# Patient Record
Sex: Male | Born: 1940
Health system: Southern US, Community
[De-identification: ages and names within clinical notes are randomized; demographics above are authoritative.]

## PROBLEM LIST (undated history)

## (undated) DIAGNOSIS — N529 Male erectile dysfunction, unspecified: Secondary | ICD-10-CM

## (undated) DIAGNOSIS — R4701 Aphasia: Secondary | ICD-10-CM

## (undated) DIAGNOSIS — H9190 Unspecified hearing loss, unspecified ear: Secondary | ICD-10-CM

## (undated) DIAGNOSIS — I4891 Unspecified atrial fibrillation: Secondary | ICD-10-CM

## (undated) DIAGNOSIS — R001 Bradycardia, unspecified: Secondary | ICD-10-CM

## (undated) DIAGNOSIS — I9789 Other postprocedural complications and disorders of the circulatory system, not elsewhere classified: Secondary | ICD-10-CM

## (undated) DIAGNOSIS — M722 Plantar fascial fibromatosis: Secondary | ICD-10-CM

## (undated) DIAGNOSIS — Z973 Presence of spectacles and contact lenses: Secondary | ICD-10-CM

## (undated) DIAGNOSIS — C801 Malignant (primary) neoplasm, unspecified: Secondary | ICD-10-CM

## (undated) DIAGNOSIS — I4589 Other specified conduction disorders: Secondary | ICD-10-CM

## (undated) DIAGNOSIS — I2510 Atherosclerotic heart disease of native coronary artery without angina pectoris: Secondary | ICD-10-CM

## (undated) DIAGNOSIS — I350 Nonrheumatic aortic (valve) stenosis: Secondary | ICD-10-CM

## (undated) DIAGNOSIS — R972 Elevated prostate specific antigen [PSA]: Secondary | ICD-10-CM

## (undated) DIAGNOSIS — I6529 Occlusion and stenosis of unspecified carotid artery: Secondary | ICD-10-CM

## (undated) DIAGNOSIS — N400 Enlarged prostate without lower urinary tract symptoms: Secondary | ICD-10-CM

## (undated) HISTORY — DX: Occlusion and stenosis of unspecified carotid artery: I65.29

## (undated) HISTORY — DX: Malignant (primary) neoplasm, unspecified: C80.1

## (undated) HISTORY — DX: Plantar fascial fibromatosis: M72.2

## (undated) HISTORY — DX: Unspecified atrial fibrillation: I97.89

## (undated) HISTORY — PX: HERNIA REPAIR: SHX51

## (undated) HISTORY — DX: Male erectile dysfunction, unspecified: N52.9

## (undated) HISTORY — PX: EXTERNAL EAR SURGERY: SHX627

## (undated) HISTORY — DX: Aphasia: R47.01

## (undated) HISTORY — DX: Atherosclerotic heart disease of native coronary artery without angina pectoris: I25.10

## (undated) HISTORY — DX: Elevated prostate specific antigen (PSA): R97.20

## (undated) HISTORY — DX: Bradycardia, unspecified: R00.1

## (undated) HISTORY — PX: TRANSCATHETER AORTIC VALVE REPLACEMENT, TRANSFEMORAL: SHX6400

## (undated) HISTORY — DX: Unspecified atrial fibrillation: I48.91

---

## 2012-03-02 DIAGNOSIS — L570 Actinic keratosis: Secondary | ICD-10-CM | POA: Diagnosis not present

## 2012-03-02 DIAGNOSIS — Z125 Encounter for screening for malignant neoplasm of prostate: Secondary | ICD-10-CM | POA: Diagnosis not present

## 2012-03-02 DIAGNOSIS — E781 Pure hyperglyceridemia: Secondary | ICD-10-CM | POA: Diagnosis not present

## 2012-03-02 DIAGNOSIS — E291 Testicular hypofunction: Secondary | ICD-10-CM | POA: Diagnosis not present

## 2012-03-02 DIAGNOSIS — R5381 Other malaise: Secondary | ICD-10-CM | POA: Diagnosis not present

## 2012-04-19 DIAGNOSIS — Z411 Encounter for cosmetic surgery: Secondary | ICD-10-CM | POA: Diagnosis not present

## 2012-04-19 DIAGNOSIS — L570 Actinic keratosis: Secondary | ICD-10-CM | POA: Diagnosis not present

## 2012-04-19 DIAGNOSIS — B078 Other viral warts: Secondary | ICD-10-CM | POA: Diagnosis not present

## 2012-04-19 DIAGNOSIS — L821 Other seborrheic keratosis: Secondary | ICD-10-CM | POA: Diagnosis not present

## 2012-06-01 DIAGNOSIS — M19049 Primary osteoarthritis, unspecified hand: Secondary | ICD-10-CM | POA: Diagnosis not present

## 2012-11-01 ENCOUNTER — Ambulatory Visit (INDEPENDENT_AMBULATORY_CARE_PROVIDER_SITE_OTHER): Payer: Medicare Other | Admitting: Sports Medicine

## 2012-11-01 VITALS — BP 142/74 | Ht 70.0 in | Wt 170.0 lb

## 2012-11-01 DIAGNOSIS — R351 Nocturia: Secondary | ICD-10-CM | POA: Diagnosis not present

## 2012-11-01 DIAGNOSIS — N138 Other obstructive and reflux uropathy: Secondary | ICD-10-CM | POA: Diagnosis not present

## 2012-11-01 DIAGNOSIS — N401 Enlarged prostate with lower urinary tract symptoms: Secondary | ICD-10-CM

## 2012-11-01 DIAGNOSIS — R350 Frequency of micturition: Secondary | ICD-10-CM | POA: Diagnosis not present

## 2012-11-01 DIAGNOSIS — M6289 Other specified disorders of muscle: Secondary | ICD-10-CM

## 2012-11-01 DIAGNOSIS — IMO0001 Reserved for inherently not codable concepts without codable children: Secondary | ICD-10-CM

## 2012-11-01 DIAGNOSIS — R29898 Other symptoms and signs involving the musculoskeletal system: Secondary | ICD-10-CM

## 2012-11-01 DIAGNOSIS — M2550 Pain in unspecified joint: Secondary | ICD-10-CM

## 2012-11-01 DIAGNOSIS — M791 Myalgia, unspecified site: Secondary | ICD-10-CM

## 2012-11-01 DIAGNOSIS — R5381 Other malaise: Secondary | ICD-10-CM | POA: Diagnosis not present

## 2012-11-01 DIAGNOSIS — R5383 Other fatigue: Secondary | ICD-10-CM

## 2012-11-01 LAB — COMPREHENSIVE METABOLIC PANEL
ALT: 18 U/L (ref 0–53)
AST: 22 U/L (ref 0–37)
Albumin: 4.1 g/dL (ref 3.5–5.2)
Alkaline Phosphatase: 51 U/L (ref 39–117)
BUN: 21 mg/dL (ref 6–23)
CO2: 29 mEq/L (ref 19–32)
Calcium: 9.2 mg/dL (ref 8.4–10.5)
Chloride: 104 mEq/L (ref 96–112)
Creat: 0.91 mg/dL (ref 0.50–1.35)
Glucose, Bld: 64 mg/dL — ABNORMAL LOW (ref 70–99)
Potassium: 4.5 mEq/L (ref 3.5–5.3)
Sodium: 140 mEq/L (ref 135–145)
Total Bilirubin: 0.8 mg/dL (ref 0.3–1.2)
Total Protein: 6.9 g/dL (ref 6.0–8.3)

## 2012-11-01 LAB — CBC
HCT: 45.1 % (ref 39.0–52.0)
Hemoglobin: 15.3 g/dL (ref 13.0–17.0)
MCH: 32.4 pg (ref 26.0–34.0)
MCHC: 33.9 g/dL (ref 30.0–36.0)
MCV: 95.6 fL (ref 78.0–100.0)
Platelets: 193 10*3/uL (ref 150–400)
RBC: 4.72 MIL/uL (ref 4.22–5.81)
RDW: 13 % (ref 11.5–15.5)
WBC: 5.2 10*3/uL (ref 4.0–10.5)

## 2012-11-01 LAB — CK: Total CK: 122 U/L (ref 7–232)

## 2012-11-01 LAB — FERRITIN: Ferritin: 85 ng/mL (ref 22–322)

## 2012-11-01 NOTE — Patient Instructions (Addendum)
You have been scheduled for an appointment with Dr. Laverle Patter at [facility] urology on 12/07/12 at 11:15 am Their phone number is [phone]

## 2012-11-01 NOTE — Assessment & Plan Note (Signed)
This needs evaluation by urology. We arranged a referral to Dr. Laverle Patter.  His urological symptoms severely interfere with his quality of life.

## 2012-11-01 NOTE — Progress Notes (Signed)
Patient ID: Joshua Hess, male   DOB: 1941/06/09, 72 y.o.   MRN: [id number]  Patient is a runner for over 40 years. He has run over 100 marathon. For the last 18 months he has found himself unable to do what he is in the past. Now he hasdeveloped bilateral quadriceps soreness that will stick with it for a day or 2 after a long run. This has been progressively worse over last 6 months. He continues to do some long runs in the range of 20 miles but starting at miles 12 to 14 he has to walk/ run just  To finish.  Runs 40 miles per week when he is training okay Still goes to the gym 3 days a week and does strength work  Denies any specific injury Diet overall is pretty good but he is a vegetarian. He thinks he may sometimes like enough protein or minerals in his diet. He has not taken specific supplements.  Medical history is very unremarkable with no surgeries. He is taking no medications. No history of hospitalizations.  Review of systems is quite remarkable in that he gets up 6-8 times a night with frequent urination. During runs he will lose 5-6 pounds of sweating. If he drinks enough to replace the fluid he will stay up  all night because of frequency. PSA  about 18 months ago was normal. Annual physical exams have been normal  Physical examination Athletic-appearing older male with excellent strength No acute distress Static strength testing of hip flexion and abduction is excellent Quadriceps strength is excellent Hamstring strength excellent Calf strength excellent  Straight leg raise is normal Neurological evaluation normal SI joint reveals normal motion  Feet reveals some breakdown of the transverse arch on the left with splayed toes and bunionette  Prostate feels to be somewhat boggy and about 3+ Stool blood was negative

## 2012-11-01 NOTE — Assessment & Plan Note (Signed)
I will do a complete metabolic workup of labs  This gentleman seems very healthy but he has chronic muscle aching and inability to exercise like he's been doing for years is troublesome  He may be getting incomplete muscle recovery that is related to his insomnia and frequency with nocturia

## 2012-11-02 LAB — PSA: PSA: 2.79 ng/mL (ref <=4.00)

## 2012-11-02 LAB — SEDIMENTATION RATE: Sed Rate: 1 mm/hr (ref 0–16)

## 2012-12-07 DIAGNOSIS — N529 Male erectile dysfunction, unspecified: Secondary | ICD-10-CM | POA: Diagnosis not present

## 2012-12-07 DIAGNOSIS — R351 Nocturia: Secondary | ICD-10-CM | POA: Diagnosis not present

## 2013-08-10 DIAGNOSIS — L578 Other skin changes due to chronic exposure to nonionizing radiation: Secondary | ICD-10-CM | POA: Diagnosis not present

## 2013-08-10 DIAGNOSIS — Z85828 Personal history of other malignant neoplasm of skin: Secondary | ICD-10-CM | POA: Diagnosis not present

## 2013-08-10 DIAGNOSIS — Z0000 Encounter for general adult medical examination without abnormal findings: Secondary | ICD-10-CM | POA: Diagnosis not present

## 2013-08-10 DIAGNOSIS — E663 Overweight: Secondary | ICD-10-CM | POA: Diagnosis not present

## 2013-08-10 DIAGNOSIS — Z125 Encounter for screening for malignant neoplasm of prostate: Secondary | ICD-10-CM | POA: Diagnosis not present

## 2013-08-10 DIAGNOSIS — M25519 Pain in unspecified shoulder: Secondary | ICD-10-CM | POA: Diagnosis not present

## 2013-09-01 DIAGNOSIS — Z125 Encounter for screening for malignant neoplasm of prostate: Secondary | ICD-10-CM | POA: Diagnosis not present

## 2013-09-15 DIAGNOSIS — N401 Enlarged prostate with lower urinary tract symptoms: Secondary | ICD-10-CM | POA: Diagnosis not present

## 2013-09-15 DIAGNOSIS — N139 Obstructive and reflux uropathy, unspecified: Secondary | ICD-10-CM | POA: Diagnosis not present

## 2013-09-15 DIAGNOSIS — R972 Elevated prostate specific antigen [PSA]: Secondary | ICD-10-CM | POA: Diagnosis not present

## 2013-09-15 DIAGNOSIS — R351 Nocturia: Secondary | ICD-10-CM | POA: Diagnosis not present

## 2013-10-06 DIAGNOSIS — Z1211 Encounter for screening for malignant neoplasm of colon: Secondary | ICD-10-CM | POA: Diagnosis not present

## 2014-03-16 DIAGNOSIS — R972 Elevated prostate specific antigen [PSA]: Secondary | ICD-10-CM | POA: Diagnosis not present

## 2014-03-21 ENCOUNTER — Encounter: Payer: Self-pay | Admitting: Podiatry

## 2014-03-21 ENCOUNTER — Ambulatory Visit (INDEPENDENT_AMBULATORY_CARE_PROVIDER_SITE_OTHER): Payer: Medicare Other

## 2014-03-21 ENCOUNTER — Ambulatory Visit (INDEPENDENT_AMBULATORY_CARE_PROVIDER_SITE_OTHER): Payer: Medicare Other | Admitting: Podiatry

## 2014-03-21 VITALS — BP 138/76 | HR 53 | Resp 18

## 2014-03-21 DIAGNOSIS — R52 Pain, unspecified: Secondary | ICD-10-CM | POA: Diagnosis not present

## 2014-03-21 DIAGNOSIS — M2040 Other hammer toe(s) (acquired), unspecified foot: Secondary | ICD-10-CM

## 2014-03-21 NOTE — Progress Notes (Signed)
   Subjective:    Patient ID: Joshua Hess, male    DOB: [birth year], 73 y.o.   MRN: [id number]  HPI i have three toes on the left foot, 2nd and 3rd and 4th and they are at an angle and they ache and hurts with shoes and no swelling or burning or throbbing and no numbness or tingling   73 year old male presents the office today with complaints of left second and third toe changes. States that he is an avid runner and has noticed some discomfort around the toes after long runs. He is noted the second and third toes start to drift medially and he tapes the toes together while he runs to help take the pressure off. Denies any trauma to the area. Only has pain while running long distances. No other complaints at this time.     Review of Systems  HENT: Positive for hearing loss.   All other systems reviewed and are negative.      Objective:   Physical Exam AAO x3, NAD DP/PT pulses palpable 2/4 b/l. CRT < 3 sec Left foot second digit slight semirigid hammertoe contracture with medial deviation of the digit. Medial deviation of the third digit. No tenderness to palpation around the area. While running has pain along the dorsal aspect of the digit as well as plantar pain just distal to the metatarsal heads. No specific tenderness in the area at this time. No edema. No pain with vibratory sensation. Right second digit with slight flexible hammertoe contracture. Mild equinus bilaterally.  MMT 5/5, ROM WNL No leg pain/edema/warmth.       Assessment & Plan:  73 year old male left foot hammertoe contractures possible chronic plantar plate injury. -Various treatment options both conservative and surgical were discussed the patient including alternatives, risks, complications. -X-rays were obtained and reviewed with the patient. -Patient brought and multiple pairs running shoes which reviewed. Due to the hammering of the digits recommended a higher toe box to accommodate the toes. -Dispensed metatarsal  pads to help elevate the metatarsal heads to hopefully help take the pressure off of the plantar foot and to take pressure off of the toes. -Discussed custom versus over-the-counter orthotics to help support his foot type -Followup as needed. Call with any questions or concerns or any change in symptoms.

## 2014-03-22 ENCOUNTER — Encounter: Payer: Self-pay | Admitting: Podiatry

## 2014-03-30 DIAGNOSIS — N401 Enlarged prostate with lower urinary tract symptoms: Secondary | ICD-10-CM | POA: Diagnosis not present

## 2014-03-30 DIAGNOSIS — R351 Nocturia: Secondary | ICD-10-CM | POA: Diagnosis not present

## 2014-03-30 DIAGNOSIS — R972 Elevated prostate specific antigen [PSA]: Secondary | ICD-10-CM | POA: Diagnosis not present

## 2014-03-30 DIAGNOSIS — N529 Male erectile dysfunction, unspecified: Secondary | ICD-10-CM | POA: Diagnosis not present

## 2014-04-16 DIAGNOSIS — Z23 Encounter for immunization: Secondary | ICD-10-CM | POA: Diagnosis not present

## 2014-05-23 DIAGNOSIS — D485 Neoplasm of uncertain behavior of skin: Secondary | ICD-10-CM | POA: Diagnosis not present

## 2014-05-23 DIAGNOSIS — L570 Actinic keratosis: Secondary | ICD-10-CM | POA: Diagnosis not present

## 2014-05-23 DIAGNOSIS — D692 Other nonthrombocytopenic purpura: Secondary | ICD-10-CM | POA: Diagnosis not present

## 2014-05-23 DIAGNOSIS — B079 Viral wart, unspecified: Secondary | ICD-10-CM | POA: Diagnosis not present

## 2014-05-23 DIAGNOSIS — L814 Other melanin hyperpigmentation: Secondary | ICD-10-CM | POA: Diagnosis not present

## 2014-05-24 DIAGNOSIS — C44622 Squamous cell carcinoma of skin of right upper limb, including shoulder: Secondary | ICD-10-CM | POA: Diagnosis not present

## 2014-06-13 DIAGNOSIS — M1711 Unilateral primary osteoarthritis, right knee: Secondary | ICD-10-CM | POA: Diagnosis not present

## 2014-06-13 DIAGNOSIS — M25561 Pain in right knee: Secondary | ICD-10-CM | POA: Diagnosis not present

## 2014-06-25 DIAGNOSIS — L570 Actinic keratosis: Secondary | ICD-10-CM | POA: Diagnosis not present

## 2014-06-28 DIAGNOSIS — C44622 Squamous cell carcinoma of skin of right upper limb, including shoulder: Secondary | ICD-10-CM | POA: Diagnosis not present

## 2014-06-29 DIAGNOSIS — C44622 Squamous cell carcinoma of skin of right upper limb, including shoulder: Secondary | ICD-10-CM | POA: Diagnosis not present

## 2014-07-09 DIAGNOSIS — L089 Local infection of the skin and subcutaneous tissue, unspecified: Secondary | ICD-10-CM | POA: Diagnosis not present

## 2014-07-09 DIAGNOSIS — L570 Actinic keratosis: Secondary | ICD-10-CM | POA: Diagnosis not present

## 2014-08-10 DIAGNOSIS — Z0000 Encounter for general adult medical examination without abnormal findings: Secondary | ICD-10-CM | POA: Diagnosis not present

## 2014-08-10 DIAGNOSIS — E663 Overweight: Secondary | ICD-10-CM | POA: Diagnosis not present

## 2014-09-06 DIAGNOSIS — D1801 Hemangioma of skin and subcutaneous tissue: Secondary | ICD-10-CM | POA: Diagnosis not present

## 2014-09-06 DIAGNOSIS — Z85828 Personal history of other malignant neoplasm of skin: Secondary | ICD-10-CM | POA: Diagnosis not present

## 2014-09-06 DIAGNOSIS — L814 Other melanin hyperpigmentation: Secondary | ICD-10-CM | POA: Diagnosis not present

## 2014-09-06 DIAGNOSIS — L821 Other seborrheic keratosis: Secondary | ICD-10-CM | POA: Diagnosis not present

## 2014-09-06 DIAGNOSIS — T8189XA Other complications of procedures, not elsewhere classified, initial encounter: Secondary | ICD-10-CM | POA: Diagnosis not present

## 2014-09-06 DIAGNOSIS — L570 Actinic keratosis: Secondary | ICD-10-CM | POA: Diagnosis not present

## 2014-12-25 DIAGNOSIS — D179 Benign lipomatous neoplasm, unspecified: Secondary | ICD-10-CM | POA: Diagnosis not present

## 2014-12-25 DIAGNOSIS — R972 Elevated prostate specific antigen [PSA]: Secondary | ICD-10-CM | POA: Diagnosis not present

## 2014-12-25 DIAGNOSIS — Z1212 Encounter for screening for malignant neoplasm of rectum: Secondary | ICD-10-CM | POA: Diagnosis not present

## 2014-12-25 DIAGNOSIS — N401 Enlarged prostate with lower urinary tract symptoms: Secondary | ICD-10-CM | POA: Diagnosis not present

## 2014-12-25 DIAGNOSIS — H538 Other visual disturbances: Secondary | ICD-10-CM | POA: Diagnosis not present

## 2014-12-26 DIAGNOSIS — L905 Scar conditions and fibrosis of skin: Secondary | ICD-10-CM | POA: Diagnosis not present

## 2014-12-26 DIAGNOSIS — Z85828 Personal history of other malignant neoplasm of skin: Secondary | ICD-10-CM | POA: Diagnosis not present

## 2014-12-26 DIAGNOSIS — L821 Other seborrheic keratosis: Secondary | ICD-10-CM | POA: Diagnosis not present

## 2014-12-26 DIAGNOSIS — L814 Other melanin hyperpigmentation: Secondary | ICD-10-CM | POA: Diagnosis not present

## 2014-12-26 DIAGNOSIS — L570 Actinic keratosis: Secondary | ICD-10-CM | POA: Diagnosis not present

## 2015-05-20 ENCOUNTER — Ambulatory Visit (INDEPENDENT_AMBULATORY_CARE_PROVIDER_SITE_OTHER): Payer: Medicare Other

## 2015-05-20 ENCOUNTER — Ambulatory Visit (INDEPENDENT_AMBULATORY_CARE_PROVIDER_SITE_OTHER): Payer: Medicare Other | Admitting: Podiatry

## 2015-05-20 DIAGNOSIS — M2040 Other hammer toe(s) (acquired), unspecified foot: Secondary | ICD-10-CM

## 2015-05-20 DIAGNOSIS — B351 Tinea unguium: Secondary | ICD-10-CM | POA: Diagnosis not present

## 2015-05-20 DIAGNOSIS — M779 Enthesopathy, unspecified: Secondary | ICD-10-CM | POA: Diagnosis not present

## 2015-05-21 NOTE — Progress Notes (Signed)
Subjective:     Patient ID: Joshua Hess, male   DOB: 1941-02-27, 74 y.o.   MRN: [id number]  HPI patient presents stating he's concerned about the continued movement of his second toe with elevation and that there is some rigid contracture of it and he is concerned it could be a problem   Review of Systems     Objective:   Physical Exam Neurovascular status found to be intact muscle strength adequate with continued slight movement of the second toe left and a dorsal and medial direction but no current pain with mild plantar keratotic lesion formation    Assessment:     Probable flexor plate stretch causing disruption of the joint and movement of the second toe    Plan:     Explain that it's impossible to give him complete idea of what this toe will do in the future but I do not recommend surgery if it's not painful. I discussed shoe gear modifications and considerations for orthotics to try to reduce plantar pressure on the feet

## 2015-05-23 ENCOUNTER — Encounter: Payer: Self-pay | Admitting: Sports Medicine

## 2015-05-23 ENCOUNTER — Ambulatory Visit (INDEPENDENT_AMBULATORY_CARE_PROVIDER_SITE_OTHER): Payer: Medicare Other | Admitting: Sports Medicine

## 2015-05-23 VITALS — BP 147/83 | Ht 70.0 in | Wt 166.0 lb

## 2015-05-23 DIAGNOSIS — M791 Myalgia, unspecified site: Secondary | ICD-10-CM

## 2015-05-23 DIAGNOSIS — M25561 Pain in right knee: Secondary | ICD-10-CM

## 2015-05-23 DIAGNOSIS — IMO0001 Reserved for inherently not codable concepts without codable children: Secondary | ICD-10-CM

## 2015-05-23 DIAGNOSIS — M609 Myositis, unspecified: Secondary | ICD-10-CM | POA: Diagnosis not present

## 2015-05-23 DIAGNOSIS — M25562 Pain in left knee: Secondary | ICD-10-CM | POA: Diagnosis not present

## 2015-05-23 DIAGNOSIS — M25569 Pain in unspecified knee: Secondary | ICD-10-CM | POA: Diagnosis not present

## 2015-05-23 DIAGNOSIS — R5383 Other fatigue: Secondary | ICD-10-CM

## 2015-05-23 DIAGNOSIS — R279 Unspecified lack of coordination: Secondary | ICD-10-CM

## 2015-05-23 LAB — CBC
HCT: 44.7 % (ref 39.0–52.0)
Hemoglobin: 15.2 g/dL (ref 13.0–17.0)
MCH: 32.3 pg (ref 26.0–34.0)
MCHC: 34 g/dL (ref 30.0–36.0)
MCV: 95.1 fL (ref 78.0–100.0)
MPV: 9.2 fL (ref 8.6–12.4)
Platelets: 196 10*3/uL (ref 150–400)
RBC: 4.7 MIL/uL (ref 4.22–5.81)
RDW: 12.7 % (ref 11.5–15.5)
WBC: 5.6 10*3/uL (ref 4.0–10.5)

## 2015-05-23 LAB — FERRITIN: Ferritin: 103 ng/mL (ref 22–322)

## 2015-05-23 LAB — VITAMIN B12: Vitamin B-12: 731 pg/mL (ref 211–911)

## 2015-05-23 MED ORDER — GABAPENTIN 300 MG PO CAPS: 300.0000 mg | ORAL_CAPSULE | Freq: Every day | ORAL | Status: DC

## 2015-05-23 NOTE — Assessment & Plan Note (Signed)
Check CBC, Ferritin, B12 for underlying metabolic issues May be sleep related so will start on neurontin 300 mg QHS for 4-6 weeks Start quad strengthening/abduction strengthening exercises as pt has a shuffle marathon gait with running where his quads appear weak and may be causing him to walk after certain mileage.  F/U in 4-6 weeks to see how doing and consider Low back x-rays for spinal stenosis component at that time

## 2015-05-23 NOTE — Progress Notes (Signed)
  Joshua Hess - 74 y.o. male MRN [id number]  Date of birth: 1941/01/20 Joshua Hess is a 74 y.o. male who presents today for leg fatigue.  Leg fatigue, initial visit - patient presents today for ongoing worsening fatigue as bilateral legs for the past 1.5-2 years. He notices it when he is running and he can only run about a mile before he starts to have fatigue in his quads causing him to eventually walk. He is very active and used to do multiple exercises including lunges and squats. Denies any back pain or paresthesias going down his leg. He does have a history of BPH followed by urology in which this wakes him up for 5 times per night. No concerning factors like fevers chills night sweats or unintentional weight loss. He denies any frank pain and hip either one of his legs but does notice that his running ability has extremely decreased over the past 1.5-2 years. No changes around that time. Denies any pica/pagophagia but is vegetarian but states gets lots of protein in diet.   PMHx - Updated and reviewed.  Contributory factors include: BPH with multiple nighttime awakenings  PSHx - Updated and reviewed.  Contributory factors include:  Denies  FHx - Updated and reviewed.  Contributory factors include:  Denies  Medications - None    ROS Per HPI.  12 point negative other than per HPI.   Exam:  There were no vitals filed for this visit. Gen: NAD Cardiorespiratory - Normal respiratory effort/rate.  RRR Hip: Normal alignment, normal ROM in all planes B/L.  MS 5/5 with slight abduction weakness L>R.  Neurovascularly intact B/L LE and negative SLR B/L  Imaging:  Non obtained today

## 2015-05-24 ENCOUNTER — Telehealth: Payer: Self-pay | Admitting: *Deleted

## 2015-05-24 NOTE — Telephone Encounter (Signed)
Left msg about normal labwork.

## 2015-05-24 NOTE — Telephone Encounter (Signed)
-----   Message from Stefanie Libel, MD sent at 05/24/2015 10:42 AM EST ----- Berton Lan  Please give him a call to let him know that all his lab values are good.  With that the case we will work on the sleep issue with gabapentin as we discussed.  KBF ----- Message -----    From: Lab in [facility] Interface    Sent: 05/23/2015   9:07 PM      To: Stefanie Libel, MD

## 2015-05-29 ENCOUNTER — Telehealth: Payer: Self-pay | Admitting: *Deleted

## 2015-05-29 ENCOUNTER — Other Ambulatory Visit: Payer: Self-pay | Admitting: *Deleted

## 2015-05-29 MED ORDER — GABAPENTIN 100 MG PO CAPS: 100.0000 mg | ORAL_CAPSULE | Freq: Every day | ORAL | Status: DC

## 2015-05-29 NOTE — Telephone Encounter (Signed)
Patient called saying the 300mg  gabapentin was leaving him too drowsy.  We changed the dose to the 100mg  and he can take one at night and increase up to 3 for the intended effect.

## 2015-07-03 ENCOUNTER — Ambulatory Visit: Payer: Medicare Other | Admitting: Family Medicine

## 2015-09-10 DIAGNOSIS — L570 Actinic keratosis: Secondary | ICD-10-CM | POA: Diagnosis not present

## 2015-09-10 DIAGNOSIS — D485 Neoplasm of uncertain behavior of skin: Secondary | ICD-10-CM | POA: Diagnosis not present

## 2015-09-12 DIAGNOSIS — C434 Malignant melanoma of scalp and neck: Secondary | ICD-10-CM | POA: Diagnosis not present

## 2015-09-22 DIAGNOSIS — C434 Malignant melanoma of scalp and neck: Secondary | ICD-10-CM | POA: Insufficient documentation

## 2015-09-23 DIAGNOSIS — C434 Malignant melanoma of scalp and neck: Secondary | ICD-10-CM | POA: Diagnosis not present

## 2015-09-26 DIAGNOSIS — C434 Malignant melanoma of scalp and neck: Secondary | ICD-10-CM | POA: Diagnosis not present

## 2015-09-30 DIAGNOSIS — L570 Actinic keratosis: Secondary | ICD-10-CM | POA: Diagnosis not present

## 2015-09-30 DIAGNOSIS — L814 Other melanin hyperpigmentation: Secondary | ICD-10-CM | POA: Diagnosis not present

## 2015-09-30 DIAGNOSIS — L853 Xerosis cutis: Secondary | ICD-10-CM | POA: Diagnosis not present

## 2015-09-30 DIAGNOSIS — K130 Diseases of lips: Secondary | ICD-10-CM | POA: Diagnosis not present

## 2015-09-30 DIAGNOSIS — C434 Malignant melanoma of scalp and neck: Secondary | ICD-10-CM | POA: Diagnosis not present

## 2015-09-30 DIAGNOSIS — L821 Other seborrheic keratosis: Secondary | ICD-10-CM | POA: Diagnosis not present

## 2015-09-30 DIAGNOSIS — D1801 Hemangioma of skin and subcutaneous tissue: Secondary | ICD-10-CM | POA: Diagnosis not present

## 2015-09-30 DIAGNOSIS — Z85828 Personal history of other malignant neoplasm of skin: Secondary | ICD-10-CM | POA: Diagnosis not present

## 2015-10-07 DIAGNOSIS — C434 Malignant melanoma of scalp and neck: Secondary | ICD-10-CM | POA: Diagnosis not present

## 2015-10-07 DIAGNOSIS — G4700 Insomnia, unspecified: Secondary | ICD-10-CM | POA: Diagnosis not present

## 2015-10-07 DIAGNOSIS — N401 Enlarged prostate with lower urinary tract symptoms: Secondary | ICD-10-CM | POA: Diagnosis not present

## 2015-10-08 DIAGNOSIS — G4700 Insomnia, unspecified: Secondary | ICD-10-CM | POA: Diagnosis present

## 2015-10-08 DIAGNOSIS — C439 Malignant melanoma of skin, unspecified: Secondary | ICD-10-CM | POA: Diagnosis not present

## 2015-10-08 DIAGNOSIS — C434 Malignant melanoma of scalp and neck: Secondary | ICD-10-CM | POA: Diagnosis not present

## 2015-10-08 DIAGNOSIS — N401 Enlarged prostate with lower urinary tract symptoms: Secondary | ICD-10-CM | POA: Diagnosis present

## 2015-10-21 DIAGNOSIS — C434 Malignant melanoma of scalp and neck: Secondary | ICD-10-CM | POA: Diagnosis not present

## 2015-11-04 DIAGNOSIS — R05 Cough: Secondary | ICD-10-CM | POA: Diagnosis not present

## 2015-11-04 DIAGNOSIS — R51 Headache: Secondary | ICD-10-CM | POA: Diagnosis not present

## 2015-11-04 DIAGNOSIS — R29810 Facial weakness: Secondary | ICD-10-CM | POA: Diagnosis not present

## 2015-11-04 DIAGNOSIS — R42 Dizziness and giddiness: Secondary | ICD-10-CM | POA: Diagnosis not present

## 2015-11-11 ENCOUNTER — Other Ambulatory Visit: Payer: Self-pay | Admitting: Internal Medicine

## 2015-11-11 DIAGNOSIS — R42 Dizziness and giddiness: Secondary | ICD-10-CM

## 2015-11-18 ENCOUNTER — Other Ambulatory Visit: Payer: Self-pay | Admitting: Geriatric Medicine

## 2015-11-18 DIAGNOSIS — B999 Unspecified infectious disease: Secondary | ICD-10-CM | POA: Diagnosis not present

## 2015-11-18 DIAGNOSIS — L570 Actinic keratosis: Secondary | ICD-10-CM | POA: Diagnosis not present

## 2015-11-21 ENCOUNTER — Other Ambulatory Visit: Payer: Medicare Other

## 2015-11-26 ENCOUNTER — Ambulatory Visit
Admission: RE | Admit: 2015-11-26 | Discharge: 2015-11-26 | Disposition: A | Discharge status: Home / Self Care | Payer: Medicare Other | Source: Ambulatory Visit | Attending: Internal Medicine | Admitting: Internal Medicine

## 2015-11-26 DIAGNOSIS — R42 Dizziness and giddiness: Secondary | ICD-10-CM | POA: Diagnosis not present

## 2015-11-26 DIAGNOSIS — R29810 Facial weakness: Secondary | ICD-10-CM | POA: Diagnosis not present

## 2015-12-24 DIAGNOSIS — Z0000 Encounter for general adult medical examination without abnormal findings: Secondary | ICD-10-CM | POA: Diagnosis not present

## 2015-12-24 DIAGNOSIS — G510 Bell's palsy: Secondary | ICD-10-CM | POA: Diagnosis not present

## 2015-12-24 DIAGNOSIS — Z125 Encounter for screening for malignant neoplasm of prostate: Secondary | ICD-10-CM | POA: Diagnosis not present

## 2015-12-25 DIAGNOSIS — Z0000 Encounter for general adult medical examination without abnormal findings: Secondary | ICD-10-CM | POA: Diagnosis not present

## 2015-12-31 DIAGNOSIS — Z85828 Personal history of other malignant neoplasm of skin: Secondary | ICD-10-CM | POA: Diagnosis not present

## 2015-12-31 DIAGNOSIS — Z23 Encounter for immunization: Secondary | ICD-10-CM | POA: Diagnosis not present

## 2015-12-31 DIAGNOSIS — E663 Overweight: Secondary | ICD-10-CM | POA: Diagnosis not present

## 2015-12-31 DIAGNOSIS — H9190 Unspecified hearing loss, unspecified ear: Secondary | ICD-10-CM | POA: Diagnosis not present

## 2015-12-31 DIAGNOSIS — L578 Other skin changes due to chronic exposure to nonionizing radiation: Secondary | ICD-10-CM | POA: Diagnosis not present

## 2016-01-20 DIAGNOSIS — C434 Malignant melanoma of scalp and neck: Secondary | ICD-10-CM | POA: Diagnosis not present

## 2016-03-18 DIAGNOSIS — L814 Other melanin hyperpigmentation: Secondary | ICD-10-CM | POA: Diagnosis not present

## 2016-03-18 DIAGNOSIS — L910 Hypertrophic scar: Secondary | ICD-10-CM | POA: Diagnosis not present

## 2016-03-18 DIAGNOSIS — Z85820 Personal history of malignant melanoma of skin: Secondary | ICD-10-CM | POA: Diagnosis not present

## 2016-03-18 DIAGNOSIS — D1801 Hemangioma of skin and subcutaneous tissue: Secondary | ICD-10-CM | POA: Diagnosis not present

## 2016-03-18 DIAGNOSIS — L570 Actinic keratosis: Secondary | ICD-10-CM | POA: Diagnosis not present

## 2016-03-18 DIAGNOSIS — L244 Irritant contact dermatitis due to drugs in contact with skin: Secondary | ICD-10-CM | POA: Diagnosis not present

## 2016-03-18 DIAGNOSIS — D225 Melanocytic nevi of trunk: Secondary | ICD-10-CM | POA: Diagnosis not present

## 2016-03-18 DIAGNOSIS — L821 Other seborrheic keratosis: Secondary | ICD-10-CM | POA: Diagnosis not present

## 2016-03-18 DIAGNOSIS — Z85828 Personal history of other malignant neoplasm of skin: Secondary | ICD-10-CM | POA: Diagnosis not present

## 2016-06-06 DIAGNOSIS — Z23 Encounter for immunization: Secondary | ICD-10-CM | POA: Diagnosis not present

## 2016-06-15 DIAGNOSIS — Q828 Other specified congenital malformations of skin: Secondary | ICD-10-CM | POA: Diagnosis not present

## 2016-06-15 DIAGNOSIS — M21621 Bunionette of right foot: Secondary | ICD-10-CM | POA: Diagnosis not present

## 2016-06-15 DIAGNOSIS — M21622 Bunionette of left foot: Secondary | ICD-10-CM | POA: Diagnosis not present

## 2016-06-15 DIAGNOSIS — M2042 Other hammer toe(s) (acquired), left foot: Secondary | ICD-10-CM | POA: Diagnosis not present

## 2016-06-23 DIAGNOSIS — Z79899 Other long term (current) drug therapy: Secondary | ICD-10-CM | POA: Diagnosis not present

## 2016-11-24 DIAGNOSIS — L570 Actinic keratosis: Secondary | ICD-10-CM | POA: Diagnosis not present

## 2016-11-24 DIAGNOSIS — L910 Hypertrophic scar: Secondary | ICD-10-CM | POA: Diagnosis not present

## 2016-11-24 DIAGNOSIS — L905 Scar conditions and fibrosis of skin: Secondary | ICD-10-CM | POA: Diagnosis not present

## 2016-11-24 DIAGNOSIS — Z85820 Personal history of malignant melanoma of skin: Secondary | ICD-10-CM | POA: Diagnosis not present

## 2016-11-24 DIAGNOSIS — L814 Other melanin hyperpigmentation: Secondary | ICD-10-CM | POA: Diagnosis not present

## 2016-11-24 DIAGNOSIS — D225 Melanocytic nevi of trunk: Secondary | ICD-10-CM | POA: Diagnosis not present

## 2016-11-24 DIAGNOSIS — L821 Other seborrheic keratosis: Secondary | ICD-10-CM | POA: Diagnosis not present

## 2016-12-31 DIAGNOSIS — Z0000 Encounter for general adult medical examination without abnormal findings: Secondary | ICD-10-CM | POA: Diagnosis not present

## 2016-12-31 DIAGNOSIS — Z125 Encounter for screening for malignant neoplasm of prostate: Secondary | ICD-10-CM | POA: Diagnosis not present

## 2016-12-31 DIAGNOSIS — Z85828 Personal history of other malignant neoplasm of skin: Secondary | ICD-10-CM | POA: Diagnosis not present

## 2016-12-31 DIAGNOSIS — N390 Urinary tract infection, site not specified: Secondary | ICD-10-CM | POA: Diagnosis not present

## 2016-12-31 DIAGNOSIS — Z13220 Encounter for screening for lipoid disorders: Secondary | ICD-10-CM | POA: Diagnosis not present

## 2017-01-05 DIAGNOSIS — L578 Other skin changes due to chronic exposure to nonionizing radiation: Secondary | ICD-10-CM | POA: Diagnosis not present

## 2017-01-05 DIAGNOSIS — R319 Hematuria, unspecified: Secondary | ICD-10-CM | POA: Diagnosis not present

## 2017-01-05 DIAGNOSIS — Z85828 Personal history of other malignant neoplasm of skin: Secondary | ICD-10-CM | POA: Diagnosis not present

## 2017-01-05 DIAGNOSIS — H9190 Unspecified hearing loss, unspecified ear: Secondary | ICD-10-CM | POA: Diagnosis not present

## 2017-01-18 DIAGNOSIS — C434 Malignant melanoma of scalp and neck: Secondary | ICD-10-CM | POA: Diagnosis not present

## 2017-02-05 DIAGNOSIS — R972 Elevated prostate specific antigen [PSA]: Secondary | ICD-10-CM | POA: Diagnosis not present

## 2017-02-05 DIAGNOSIS — R3121 Asymptomatic microscopic hematuria: Secondary | ICD-10-CM | POA: Diagnosis not present

## 2017-03-01 DIAGNOSIS — L814 Other melanin hyperpigmentation: Secondary | ICD-10-CM | POA: Diagnosis not present

## 2017-03-01 DIAGNOSIS — L570 Actinic keratosis: Secondary | ICD-10-CM | POA: Diagnosis not present

## 2017-03-01 DIAGNOSIS — Z85820 Personal history of malignant melanoma of skin: Secondary | ICD-10-CM | POA: Diagnosis not present

## 2017-03-01 DIAGNOSIS — D692 Other nonthrombocytopenic purpura: Secondary | ICD-10-CM | POA: Diagnosis not present

## 2017-03-01 DIAGNOSIS — L821 Other seborrheic keratosis: Secondary | ICD-10-CM | POA: Diagnosis not present

## 2017-03-01 DIAGNOSIS — D1801 Hemangioma of skin and subcutaneous tissue: Secondary | ICD-10-CM | POA: Diagnosis not present

## 2017-03-05 DIAGNOSIS — M79645 Pain in left finger(s): Secondary | ICD-10-CM | POA: Diagnosis not present

## 2017-03-05 DIAGNOSIS — S63255A Unspecified dislocation of left ring finger, initial encounter: Secondary | ICD-10-CM | POA: Diagnosis not present

## 2017-03-11 DIAGNOSIS — M79645 Pain in left finger(s): Secondary | ICD-10-CM | POA: Diagnosis not present

## 2017-03-25 DIAGNOSIS — M79645 Pain in left finger(s): Secondary | ICD-10-CM | POA: Diagnosis not present

## 2017-07-02 DIAGNOSIS — D692 Other nonthrombocytopenic purpura: Secondary | ICD-10-CM | POA: Diagnosis not present

## 2017-07-02 DIAGNOSIS — L821 Other seborrheic keratosis: Secondary | ICD-10-CM | POA: Diagnosis not present

## 2017-07-02 DIAGNOSIS — Z85820 Personal history of malignant melanoma of skin: Secondary | ICD-10-CM | POA: Diagnosis not present

## 2017-07-02 DIAGNOSIS — Z85828 Personal history of other malignant neoplasm of skin: Secondary | ICD-10-CM | POA: Diagnosis not present

## 2017-07-02 DIAGNOSIS — C44729 Squamous cell carcinoma of skin of left lower limb, including hip: Secondary | ICD-10-CM | POA: Diagnosis not present

## 2017-07-02 DIAGNOSIS — L570 Actinic keratosis: Secondary | ICD-10-CM | POA: Diagnosis not present

## 2017-07-02 DIAGNOSIS — L720 Epidermal cyst: Secondary | ICD-10-CM | POA: Diagnosis not present

## 2017-08-10 DIAGNOSIS — R972 Elevated prostate specific antigen [PSA]: Secondary | ICD-10-CM | POA: Diagnosis not present

## 2017-08-18 DIAGNOSIS — N401 Enlarged prostate with lower urinary tract symptoms: Secondary | ICD-10-CM | POA: Diagnosis not present

## 2017-08-18 DIAGNOSIS — R351 Nocturia: Secondary | ICD-10-CM | POA: Diagnosis not present

## 2017-08-18 DIAGNOSIS — R972 Elevated prostate specific antigen [PSA]: Secondary | ICD-10-CM | POA: Diagnosis not present

## 2017-10-01 DIAGNOSIS — Z85820 Personal history of malignant melanoma of skin: Secondary | ICD-10-CM | POA: Diagnosis not present

## 2017-10-01 DIAGNOSIS — L570 Actinic keratosis: Secondary | ICD-10-CM | POA: Diagnosis not present

## 2017-10-01 DIAGNOSIS — L821 Other seborrheic keratosis: Secondary | ICD-10-CM | POA: Diagnosis not present

## 2017-10-01 DIAGNOSIS — L814 Other melanin hyperpigmentation: Secondary | ICD-10-CM | POA: Diagnosis not present

## 2017-10-01 DIAGNOSIS — C44729 Squamous cell carcinoma of skin of left lower limb, including hip: Secondary | ICD-10-CM | POA: Diagnosis not present

## 2017-10-01 DIAGNOSIS — Z85828 Personal history of other malignant neoplasm of skin: Secondary | ICD-10-CM | POA: Diagnosis not present

## 2017-10-01 DIAGNOSIS — D692 Other nonthrombocytopenic purpura: Secondary | ICD-10-CM | POA: Diagnosis not present

## 2017-10-01 DIAGNOSIS — D225 Melanocytic nevi of trunk: Secondary | ICD-10-CM | POA: Diagnosis not present

## 2018-01-19 DIAGNOSIS — Z0000 Encounter for general adult medical examination without abnormal findings: Secondary | ICD-10-CM | POA: Diagnosis not present

## 2018-01-19 DIAGNOSIS — Z125 Encounter for screening for malignant neoplasm of prostate: Secondary | ICD-10-CM | POA: Diagnosis not present

## 2018-01-24 DIAGNOSIS — D179 Benign lipomatous neoplasm, unspecified: Secondary | ICD-10-CM | POA: Diagnosis not present

## 2018-01-24 DIAGNOSIS — H9190 Unspecified hearing loss, unspecified ear: Secondary | ICD-10-CM | POA: Diagnosis not present

## 2018-01-24 DIAGNOSIS — F5104 Psychophysiologic insomnia: Secondary | ICD-10-CM | POA: Diagnosis not present

## 2018-01-24 DIAGNOSIS — R0989 Other specified symptoms and signs involving the circulatory and respiratory systems: Secondary | ICD-10-CM | POA: Diagnosis not present

## 2018-01-24 DIAGNOSIS — R972 Elevated prostate specific antigen [PSA]: Secondary | ICD-10-CM | POA: Diagnosis not present

## 2018-01-24 DIAGNOSIS — Z0000 Encounter for general adult medical examination without abnormal findings: Secondary | ICD-10-CM | POA: Diagnosis not present

## 2018-01-24 DIAGNOSIS — I38 Endocarditis, valve unspecified: Secondary | ICD-10-CM | POA: Diagnosis not present

## 2018-01-24 DIAGNOSIS — Z23 Encounter for immunization: Secondary | ICD-10-CM | POA: Diagnosis not present

## 2018-01-24 DIAGNOSIS — N401 Enlarged prostate with lower urinary tract symptoms: Secondary | ICD-10-CM | POA: Diagnosis not present

## 2018-01-24 DIAGNOSIS — G43109 Migraine with aura, not intractable, without status migrainosus: Secondary | ICD-10-CM | POA: Diagnosis not present

## 2018-02-02 DIAGNOSIS — Z85828 Personal history of other malignant neoplasm of skin: Secondary | ICD-10-CM | POA: Diagnosis not present

## 2018-02-02 DIAGNOSIS — L570 Actinic keratosis: Secondary | ICD-10-CM | POA: Diagnosis not present

## 2018-02-02 DIAGNOSIS — D225 Melanocytic nevi of trunk: Secondary | ICD-10-CM | POA: Diagnosis not present

## 2018-02-02 DIAGNOSIS — D2262 Melanocytic nevi of left upper limb, including shoulder: Secondary | ICD-10-CM | POA: Diagnosis not present

## 2018-02-02 DIAGNOSIS — L814 Other melanin hyperpigmentation: Secondary | ICD-10-CM | POA: Diagnosis not present

## 2018-02-02 DIAGNOSIS — Z85820 Personal history of malignant melanoma of skin: Secondary | ICD-10-CM | POA: Diagnosis not present

## 2018-02-02 DIAGNOSIS — C44722 Squamous cell carcinoma of skin of right lower limb, including hip: Secondary | ICD-10-CM | POA: Diagnosis not present

## 2018-02-02 DIAGNOSIS — D2261 Melanocytic nevi of right upper limb, including shoulder: Secondary | ICD-10-CM | POA: Diagnosis not present

## 2018-02-02 DIAGNOSIS — D0471 Carcinoma in situ of skin of right lower limb, including hip: Secondary | ICD-10-CM | POA: Diagnosis not present

## 2018-02-02 DIAGNOSIS — C44729 Squamous cell carcinoma of skin of left lower limb, including hip: Secondary | ICD-10-CM | POA: Diagnosis not present

## 2018-02-02 DIAGNOSIS — L821 Other seborrheic keratosis: Secondary | ICD-10-CM | POA: Diagnosis not present

## 2018-02-03 ENCOUNTER — Encounter: Payer: Self-pay | Admitting: Cardiovascular Disease

## 2018-02-03 DIAGNOSIS — R011 Cardiac murmur, unspecified: Secondary | ICD-10-CM | POA: Diagnosis not present

## 2018-02-03 DIAGNOSIS — R0989 Other specified symptoms and signs involving the circulatory and respiratory systems: Secondary | ICD-10-CM | POA: Diagnosis not present

## 2018-03-01 DIAGNOSIS — H903 Sensorineural hearing loss, bilateral: Secondary | ICD-10-CM | POA: Diagnosis not present

## 2018-03-01 DIAGNOSIS — H6123 Impacted cerumen, bilateral: Secondary | ICD-10-CM | POA: Diagnosis not present

## 2018-04-12 ENCOUNTER — Other Ambulatory Visit: Payer: Self-pay

## 2018-04-12 ENCOUNTER — Encounter: Payer: Self-pay | Admitting: Vascular Surgery

## 2018-04-12 ENCOUNTER — Ambulatory Visit (INDEPENDENT_AMBULATORY_CARE_PROVIDER_SITE_OTHER): Payer: Medicare HMO | Admitting: Vascular Surgery

## 2018-04-12 VITALS — BP 139/76 | HR 50 | Temp 98.1°F | Resp 18 | Ht 69.0 in | Wt 175.0 lb

## 2018-04-12 DIAGNOSIS — I6522 Occlusion and stenosis of left carotid artery: Secondary | ICD-10-CM | POA: Diagnosis not present

## 2018-04-12 NOTE — Progress Notes (Signed)
[facility]  Patient name: Joshua Hess MRN: [id number] DOB: 08-07-1940 Sex: male  REASON FOR CONSULT: Valuation of asymptomatic left carotid stenosis  HPI: Joshua Hess is a 77 y.o. male, who is here today for evaluation of left carotid stenosis.  He underwent carotid duplex at [facility] and was found to have a 50 this 70% stenosis in his left internal carotid artery and no significant right carotid stenosis.  He is here today for further discussion.  He specifically denies any prior episodes of amaurosis fugax, a aphasia or transient ischemic attack or stroke.  He is extremely healthy.  He is never smoked.  He does not have a strong family history of premature atherosclerotic disease.  He is very active and runs multiple marathons per year.  He denies any cardiac difficulty.  Past Medical History:  Diagnosis Date  . Cancer ([place])    basal cell  . Carotid artery stenosis   . ED (erectile dysfunction)   . Elevated PSA     History reviewed. No pertinent family history.  SOCIAL HISTORY: Social History   Socioeconomic History  . Marital status: Single    Spouse name: Not on file  . Number of children: Not on file  . Years of education: Not on file  . Highest education level: Not on file  Occupational History  . Not on file  Social Needs  . Financial resource strain: Not on file  . Food insecurity:    Worry: Not on file    Inability: Not on file  . Transportation needs:    Medical: Not on file    Non-medical: Not on file  Tobacco Use  . Smoking status: Never Smoker  . Smokeless tobacco: Never Used  Substance and Sexual Activity  . Alcohol use: No  . Drug use: No  . Sexual activity: Not on file  Lifestyle  . Physical activity:    Days per week: Not on file    Minutes per session: Not on file  . Stress: Not on file  Relationships  . Social connections:    Talks on phone: Not on file   Gets together: Not on file    Attends religious service: Not on file    Active member of club or organization: Not on file    Attends meetings of clubs or organizations: Not on file    Relationship status: Not on file  . Intimate partner violence:    Fear of current or ex partner: Not on file    Emotionally abused: Not on file    Physically abused: Not on file    Forced sexual activity: Not on file  Other Topics Concern  . Not on file  Social History Narrative  . Not on file    No Known Allergies  Current Outpatient Medications  Medication Sig Dispense Refill  . traZODone (DESYREL) 50 MG tablet Take by mouth.    Marland Kitchen aspirin 325 MG tablet Take by mouth.    . diphenhydramine-acetaminophen (TYLENOL PM) 25-500 MG TABS tablet Take by mouth.    . gabapentin (NEURONTIN) 100 MG capsule Take 1 capsule (100 mg total) by mouth at bedtime. (Patient not taking: Reported on 04/12/2018) 30 capsule 2  . ibuprofen (ADVIL,MOTRIN) 200 MG tablet Take by mouth.    . sildenafil (VIAGRA) 25 MG tablet Take by mouth.     No current facility-administered medications for this visit.     REVIEW OF SYSTEMS:  [X]  denotes positive  finding, [ ]  denotes negative finding Cardiac  Comments:  Chest pain or chest pressure:    Shortness of breath upon exertion:    Short of breath when lying flat:    Irregular heart rhythm:        Vascular    Pain in calf, thigh, or hip brought on by ambulation:    Pain in feet at night that wakes you up from your sleep:     Blood clot in your veins:    Leg swelling:         Pulmonary    Oxygen at home:    Productive cough:     Wheezing:         Neurologic    Sudden weakness in arms or legs:     Sudden numbness in arms or legs:     Sudden onset of difficulty speaking or slurred speech:    Temporary loss of vision in one eye:     Problems with dizziness:         Gastrointestinal    Blood in stool:     Vomited blood:         Genitourinary    Burning when urinating:      Blood in urine:        Psychiatric    Major depression:         Hematologic    Bleeding problems:    Problems with blood clotting too easily:        Skin    Rashes or ulcers:        Constitutional    Fever or chills:      PHYSICAL EXAM: Vitals:   04/12/18 0959 04/12/18 1006 04/12/18 1008  BP: (!) 153/73 139/71 139/76  Pulse: (!) 50 (!) 50 (!) 50  Resp: 18    Temp: 98.1 F (36.7 C)    TempSrc: Oral    SpO2: 98%    Weight: 175 lb (79.4 kg)    Height: 5\' 9"  (1.753 m)      GENERAL: The patient is a well-nourished male, in no acute distress. The vital signs are documented above. CARDIOVASCULAR: Carotid arteries without bruits bilaterally.  2+ radial and 2+ dorsalis pedis pulses bilaterally PULMONARY: There is good air exchange  ABDOMEN: Soft and non-tender  MUSCULOSKELETAL: There are no major deformities or cyanosis. NEUROLOGIC: No focal weakness or paresthesias are detected. SKIN: There are no ulcers or rashes noted. PSYCHIATRIC: The patient has a normal affect.  DATA:  I reviewed his carotid duplex from 02/03/2018.  This revealed peak systolic velocities of 481 cm/s which in that lab correspond to 50 to 70% stenosis.  MEDICAL ISSUES: I had a long discussion with the patient regarding the significance of this.  I have recommended repeat duplex in 1 year to rule out progression.  I explained that his current level of stenosis puts him at no increased risk for stroke but would put him at increased for progression to a more dangerous level.  I did review symptoms of hemispheric event with the patient and he will notify estimated should this occur.  If he does have a significant event he will presented emerge immediately to the emergency department.  He was instructed to continue full activity without any limitation and will see [place] again in 1 year   Rosetta Posner, MD [facility] Tel [phone] Pager [phone]

## 2018-05-05 ENCOUNTER — Ambulatory Visit: Payer: Medicare HMO | Admitting: Cardiovascular Disease

## 2018-05-05 ENCOUNTER — Encounter: Payer: Self-pay | Admitting: Cardiovascular Disease

## 2018-05-05 VITALS — BP 160/76 | HR 55 | Ht 69.0 in | Wt 173.0 lb

## 2018-05-05 DIAGNOSIS — R011 Cardiac murmur, unspecified: Secondary | ICD-10-CM | POA: Diagnosis not present

## 2018-05-05 DIAGNOSIS — I350 Nonrheumatic aortic (valve) stenosis: Secondary | ICD-10-CM

## 2018-05-05 DIAGNOSIS — R030 Elevated blood-pressure reading, without diagnosis of hypertension: Secondary | ICD-10-CM

## 2018-05-05 DIAGNOSIS — I6522 Occlusion and stenosis of left carotid artery: Secondary | ICD-10-CM | POA: Diagnosis not present

## 2018-05-05 DIAGNOSIS — I491 Atrial premature depolarization: Secondary | ICD-10-CM

## 2018-05-05 NOTE — Patient Instructions (Signed)
Medication Instructions:  Dr Sallyanne Kuster recommends that you continue on your current medications as directed. Please refer to the Current Medication list given to you today.  If you need a refill on your cardiac medications before your next appointment, please call your pharmacy.   Testing/Procedures: 1. Echocardiogram - Your physician has requested that you have an echocardiogram. Echocardiography is a painless test that uses sound waves to create images of your heart. It provides your doctor with information about the size and shape of your heart and how well your heart's chambers and valves are working. This procedure takes approximately one hour. There are no restrictions for this procedure.  2. 24-hour Holter - Your physician has recommended that you wear a holter monitor. Holter monitors are medical devices that record the heart's electrical activity. Doctors most often use these monitors to diagnose arrhythmias. Arrhythmias are problems with the speed or rhythm of the heartbeat. The monitor is a small, portable device. You can wear one while you do your normal daily activities. This is usually used to diagnose what is causing palpitations/syncope (passing out).  >> These tests will be performed at our [facility] location [address] [place], [place] [address] [phone]  Follow-Up: Dr Sallyanne Kuster recommends that you schedule a follow-up appointment first available.

## 2018-05-05 NOTE — Progress Notes (Signed)
Cardiology Office Note:    Date:  05/05/2018   ID:  [name], DOB 1941/01/09, MRN [id number]  PCP:  [name], MD  Cardiologist:  Sanda Klein, MD  Electrophysiologist:  None   Referring MD: [name], MD   No chief complaint on file. Joshua Hess is a 77 y.o. male who is being seen today for the evaluation of aortic stenosis and arrhythmia at the request of [name], MD.   History of Present Illness:    Joshua Hess is a 77 y.o. male with a hx of excellent health and athletic lifestyle for most of his life.  He has run numerous marathons, most recently 2 years ago.  Exercises every day for a minimum of 1-1/2 hours.  He describes himself as a "health nut" and has never been hospitalized.  Although he has not really curtailed his physical exercise, he finds that running is harder than it was a year or 2 ago.  His legs become tired.  He denies exertional dyspnea.  He has not had any exertional chest pain, dizziness, presyncope or syncope.  Denies leg edema or claudication.  He has not had any focal neurological complaints.  He does describe some problems with his memory.  He has erectile dysfunction.  He was recently identified as having a heart murmur and underwent an echocardiogram.  (Unfortunately the report of that study was not initially available when I evaluated the patient, but was kindly faxed over later in the afternoon by Dr. Pennie Banter staff).  The echo reports shows evidence of normal left ventricular systolic function (EF 95-18%), mild left ventricular hypertrophy (1.24-1.35 cm), mild to moderately dilated left atrium (end-systolic volume index 31 mL/meters squared, end systolic diameter 4.7 cm) and reports a moderately calcified aortic valve with mild aortic regurgitation and severe aortic valve stenosis (peak gradient 42.7, mean gradient 25.5, calculated aortic valve area 0.8 cm) the aortic root is described as normal in size.  He has also recently undergone a  bilateral carotid ultrasound for bruits and was found to have a 50-70% stenosis in the left internal carotid artery and was seen by Dr. Curt Jews, with recommendations for conservative management.  He has never had a stroke or transient ischemic attack.  He has never smoked, does not have diabetes mellitus and has a generally favorable lipid profile (over the last 3 years total cholesterol 143-171, calculated LDL 74-96, HDL 51-55, normal triglycerides).  His blood pressure was elevated today but he was clearly uncomfortable in the cardiology office.  In July when he had his echocardiogram his blood pressure was 125/80 and at his visit with his primary care provider it was 142/84.  Normal renal function (creatinine 0.9-1.1).  Past Medical History:  Diagnosis Date  . Cancer ([place])    basal cell  . Carotid artery stenosis   . ED (erectile dysfunction)   . Elevated PSA     Past Surgical History:  Procedure Laterality Date  . EXTERNAL EAR SURGERY     melanoma removed  . HERNIA REPAIR      Current Medications: Current Meds  Medication Sig  . sildenafil (VIAGRA) 25 MG tablet Take by mouth.  . traZODone (DESYREL) 50 MG tablet Take by mouth.     Allergies:   Patient has no known allergies.   Social History   Socioeconomic History  . Marital status: Single    Spouse name: Not on file  . Number of children: Not on file  . Years of education: Not on  file  . Highest education level: Not on file  Occupational History  . Not on file  Social Needs  . Financial resource strain: Not on file  . Food insecurity:    Worry: Not on file    Inability: Not on file  . Transportation needs:    Medical: Not on file    Non-medical: Not on file  Tobacco Use  . Smoking status: Never Smoker  . Smokeless tobacco: Never Used  Substance and Sexual Activity  . Alcohol use: No  . Drug use: No  . Sexual activity: Not on file  Lifestyle  . Physical activity:    Days per week: Not on file    Minutes  per session: Not on file  . Stress: Not on file  Relationships  . Social connections:    Talks on phone: Not on file    Gets together: Not on file    Attends religious service: Not on file    Active member of club or organization: Not on file    Attends meetings of clubs or organizations: Not on file    Relationship status: Not on file  Other Topics Concern  . Not on file  Social History Narrative  . Not on file     Family History: The patient's parents died in their 57s but he does not know the reason for their demise.  He has a sister with dementia.  He does not have any children. ROS:   Please see the history of present illness.    All other systems reviewed and are negative.  EKGs/Labs/Other Studies Reviewed:    The following studies were reviewed today: Echo report from July 2019, carotid duplex ultrasound and notes from vascular surgery July 2019, notes from primary care provider and labs also from July 2019  EKG:  EKG is ordered today.  The ekg ordered today demonstrates ectopic atrial bradycardia (negative P waves in leads II, III, aVF), left axis deviation almost meeting criteria for left anterior fascicular block (-39 degrees), QTC 450 ms  Recent Labs: January 19, 2018 hemoglobin 14.9, platelets 161, glucose 94, creatinine 1.1, potassium 4.4, normal liver function tests Recent Lipid Panel Total cholesterol 171, triglycerides 104, HDL 54, LDL 96  Physical Exam:    VS:  BP (!) 160/76 (BP Location: Right Arm, Patient Position: Sitting, Cuff Size: Normal)   Pulse (!) 55   Ht 5\' 9"  (1.753 m)   Wt 173 lb (78.5 kg)   SpO2 98%   BMI 25.55 kg/m     Wt Readings from Last 3 Encounters:  05/05/18 173 lb (78.5 kg)  04/12/18 175 lb (79.4 kg)  11/26/15 166 lb (75.3 kg)     GEN: Appears very athletic, younger than stated age, well nourished, well developed in no acute distress HEENT: Normal NECK: No JVD; bilateral carotid bruits heard, but the carotid pulses are not  delayed. LYMPHATICS: No lymphadenopathy CARDIAC: Bradycardia, RRR, 3/6 early to mid peaking systolic ejection murmur heard best in the aortic focus but radiating broadly across the precordium and up the carotids.  No diastolic murmurs, rubs, gallops RESPIRATORY:  Clear to auscultation without rales, wheezing or rhonchi  ABDOMEN: Soft, non-tender, non-distended MUSCULOSKELETAL:  No edema; No deformity  SKIN: Warm and dry NEUROLOGIC:  Alert and oriented x 3 PSYCHIATRIC:  Normal affect   ASSESSMENT:    1. Aortic valve stenosis, nonrheumatic   2. Ectopic atrial rhythm   3. Left carotid artery stenosis   4. Single episode of elevated  blood pressure   5. Heart murmur    PLAN:    In order of problems listed above:  1. AS: He is physically very active.  He does not describe symptoms of critical aortic stenosis, but does have some reduction in exercise tolerance, manifesting as leg fatigue.  His physical exam is consistent with moderate aortic stenosis, as are the gradients on the echo report from July.  The calculated aortic valve area is in the range of severe aortic stenosis, in contradiction with the gradients which are moderately elevated.  I do not have access to the images of the outside echocardiogram.  Plan to repeat an echocardiogram roughly 6 months after the initial evaluation.  We discussed the symptoms of severe aortic stenosis (exertional dyspnea, exertional angina, exertional syncope): he should report these promptly if they occur.  When the time comes, I think he is an excellent candidate for either surgical or trans-arterial valve replacement. 2. Bradycardia: He is in excellent physical shape and it's likely that the ectopic atrial rhythm is a sign of sinus node suppression due to very high vagal tone.  On the other hand he is also in the age range where he might have developed a true sinus node dysfunction and chronotropic incompetence and I think it would be very useful to evaluate  Holter monitor performed during physical activity to see if he can appropriately increase his rate.  Chronotropic incompetence might explain his reported reduction in exercise tolerance compared to 2 years ago. 3. Carotid stenosis: He is asymptomatic and does not require intervention at this time.  It does raise the possibility of other locations of atherosclerosis and raises the question of need for lipid-lowering therapy.  Having said that he has an excellent lipid profile with relatively low LDL cholesterol and excellent HDL.  He does not wish to embark on lipid-lowering therapy at this time.  He has a very healthy lifestyle. 4. Elevated BP: I suspect this is situational hypertension and it does not require therapy.  Reevaluate periodically.   Medication Adjustments/Labs and Tests Ordered: Current medicines are reviewed at length with the patient today.  Concerns regarding medicines are outlined above.  Orders Placed This Encounter  Procedures  . HOLTER MONITOR - 24 HOUR  . EKG 12-Lead  . ECHOCARDIOGRAM COMPLETE   No orders of the defined types were placed in this encounter.   Patient Instructions  Medication Instructions:  Dr Sallyanne Kuster recommends that you continue on your current medications as directed. Please refer to the Current Medication list given to you today.  If you need a refill on your cardiac medications before your next appointment, please call your pharmacy.   Testing/Procedures: 1. Echocardiogram - Your physician has requested that you have an echocardiogram. Echocardiography is a painless test that uses sound waves to create images of your heart. It provides your doctor with information about the size and shape of your heart and how well your heart's chambers and valves are working. This procedure takes approximately one hour. There are no restrictions for this procedure.  2. 24-hour Holter - Your physician has recommended that you wear a holter monitor. Holter monitors  are medical devices that record the heart's electrical activity. Doctors most often use these monitors to diagnose arrhythmias. Arrhythmias are problems with the speed or rhythm of the heartbeat. The monitor is a small, portable device. You can wear one while you do your normal daily activities. This is usually used to diagnose what is causing palpitations/syncope (passing out).  >>  These tests will be performed at our [facility] location [place], [place] [address] [phone]  Follow-Up: Dr Sallyanne Kuster recommends that you schedule a follow-up appointment first available.    Signed, Sanda Klein, MD  05/05/2018 5:35 PM    [place]

## 2018-05-12 DIAGNOSIS — Z85820 Personal history of malignant melanoma of skin: Secondary | ICD-10-CM | POA: Diagnosis not present

## 2018-05-12 DIAGNOSIS — C44729 Squamous cell carcinoma of skin of left lower limb, including hip: Secondary | ICD-10-CM | POA: Diagnosis not present

## 2018-05-12 DIAGNOSIS — D1801 Hemangioma of skin and subcutaneous tissue: Secondary | ICD-10-CM | POA: Diagnosis not present

## 2018-05-12 DIAGNOSIS — D045 Carcinoma in situ of skin of trunk: Secondary | ICD-10-CM | POA: Diagnosis not present

## 2018-05-12 DIAGNOSIS — L570 Actinic keratosis: Secondary | ICD-10-CM | POA: Diagnosis not present

## 2018-05-12 DIAGNOSIS — Z85828 Personal history of other malignant neoplasm of skin: Secondary | ICD-10-CM | POA: Diagnosis not present

## 2018-05-12 DIAGNOSIS — L814 Other melanin hyperpigmentation: Secondary | ICD-10-CM | POA: Diagnosis not present

## 2018-05-12 DIAGNOSIS — L821 Other seborrheic keratosis: Secondary | ICD-10-CM | POA: Diagnosis not present

## 2018-05-12 DIAGNOSIS — D692 Other nonthrombocytopenic purpura: Secondary | ICD-10-CM | POA: Diagnosis not present

## 2018-05-18 ENCOUNTER — Ambulatory Visit (INDEPENDENT_AMBULATORY_CARE_PROVIDER_SITE_OTHER): Payer: Medicare HMO

## 2018-05-18 ENCOUNTER — Other Ambulatory Visit: Payer: Self-pay | Admitting: Cardiovascular Disease

## 2018-05-18 ENCOUNTER — Other Ambulatory Visit (HOSPITAL_COMMUNITY): Payer: Medicare HMO

## 2018-05-18 DIAGNOSIS — R001 Bradycardia, unspecified: Secondary | ICD-10-CM

## 2018-05-18 DIAGNOSIS — I491 Atrial premature depolarization: Secondary | ICD-10-CM | POA: Diagnosis not present

## 2018-05-24 DIAGNOSIS — H524 Presbyopia: Secondary | ICD-10-CM | POA: Diagnosis not present

## 2018-08-05 ENCOUNTER — Ambulatory Visit: Payer: Medicare HMO | Admitting: Cardiovascular Disease

## 2018-08-09 ENCOUNTER — Ambulatory Visit (HOSPITAL_COMMUNITY): Payer: Medicare HMO | Attending: Cardiology

## 2018-08-09 DIAGNOSIS — R011 Cardiac murmur, unspecified: Secondary | ICD-10-CM | POA: Insufficient documentation

## 2018-08-12 ENCOUNTER — Ambulatory Visit: Payer: Medicare HMO | Admitting: Cardiovascular Disease

## 2018-08-12 ENCOUNTER — Encounter: Payer: Self-pay | Admitting: Cardiovascular Disease

## 2018-08-12 VITALS — BP 130/78 | HR 54 | Ht 69.0 in | Wt 177.0 lb

## 2018-08-12 DIAGNOSIS — I350 Nonrheumatic aortic (valve) stenosis: Secondary | ICD-10-CM | POA: Insufficient documentation

## 2018-08-12 DIAGNOSIS — I6521 Occlusion and stenosis of right carotid artery: Secondary | ICD-10-CM

## 2018-08-12 DIAGNOSIS — Z85820 Personal history of malignant melanoma of skin: Secondary | ICD-10-CM | POA: Diagnosis not present

## 2018-08-12 DIAGNOSIS — C44622 Squamous cell carcinoma of skin of right upper limb, including shoulder: Secondary | ICD-10-CM | POA: Diagnosis not present

## 2018-08-12 DIAGNOSIS — R001 Bradycardia, unspecified: Secondary | ICD-10-CM | POA: Insufficient documentation

## 2018-08-12 DIAGNOSIS — L570 Actinic keratosis: Secondary | ICD-10-CM | POA: Diagnosis not present

## 2018-08-12 DIAGNOSIS — L814 Other melanin hyperpigmentation: Secondary | ICD-10-CM | POA: Diagnosis not present

## 2018-08-12 DIAGNOSIS — D225 Melanocytic nevi of trunk: Secondary | ICD-10-CM | POA: Diagnosis not present

## 2018-08-12 DIAGNOSIS — D692 Other nonthrombocytopenic purpura: Secondary | ICD-10-CM | POA: Diagnosis not present

## 2018-08-12 DIAGNOSIS — Z85828 Personal history of other malignant neoplasm of skin: Secondary | ICD-10-CM | POA: Diagnosis not present

## 2018-08-12 NOTE — Patient Instructions (Signed)
Medication Instructions:  Continue current medications If you need a refill on your cardiac medications before your next appointment, please call your pharmacy.    Follow-Up: At [facility], you and your health needs are our priority.  As part of our continuing mission to provide you with exceptional heart care, we have created designated Provider Care Teams.  These Care Teams include your primary Cardiologist (physician) and Advanced Practice Providers (APPs -  Physician Assistants and Nurse Practitioners) who all work together to provide you with the care you need, when you need it. You will need a follow up appointment in 6 months.  Please call our office 2 months in advance to schedule this appointment.  You may see Sanda Klein, MD or one of the following Advanced Practice Providers on your designated Care Team: [place], [place] . Fabian Sharp, PA-C  Any Other Special Instructions Will Be Listed Below (If Applicable).  Dr. Sallyanne Kuster has referred you to the structural heart clinic to discuss aortic valve replacement. You will see either Dr. Burt Knack or Dr. Angelena Form at our office on [address].

## 2018-08-12 NOTE — Progress Notes (Signed)
Cardiology Office Note:    Date:  08/12/2018   ID:  Joshua Hess, DOB 25-Feb-1941, MRN [id number]  PCP:  [name], MD  Cardiologist:  Sanda Klein, MD  Electrophysiologist:  None   Referring MD: [name], MD   Chief Complaint  Patient presents with  . Follow-up    discuss monitor and echo     History of Present Illness:    Joshua Hess is a 78 y.o. male with a hx of excellent health and athletic lifestyle for most of his life.  He has run numerous marathons, most recently 2 years ago.  Exercises every day for a minimum of 1-1/2 hours.  He describes himself as a "health nut" and has never been hospitalized.  He has noticed a steady decline in his physical abilities over the last few years, accelerating in the last year or 2.  He reports that after running for about 60 minutes he has to stop due to tightness in his chest and shortness of breath.  After he stops and rests for a few seconds he can pick up the pace.  He now has to do a combination of walking and jogging.  He describes mild problems with his memory and his erectile dysfunction, but otherwise is doing very well.  He specifically denies syncope, palpitations, leg edema, orthopnea, PND or any chest pain at rest.  His echo shows that he has a trileaflet aortic valve with severe aortic stenosis.  Calculated aortic valve area was 0.9 cm.  The peak gradient was 82 mmHg, the mean gradient was 46 mmHg.  Moderate aortic insufficiency is also reported.  He has normal left ventricular systolic function.  The left atrium is mildly dilated.  There is mild pulmonary hypertension at 42 mmHg.  The diastolic function was identified as showing "impaired relaxation" but the E/E prime ratio was 12-14, borderline indicative of increased left ventricular filling pressure at rest.  His recently performed Holter monitor is suspicious for sinus node dysfunction with chronotropic incompetence.  His average rate throughout the 24-hour period was  only 55 bpm.  This might be due to excellent physical conditioning/training, but may also be pathological since his heart rate maxed out at 97 bpm (78% Max predicted for age) despite intense physical activity.  He has also recently undergone a bilateral carotid ultrasound for bruits and was found to have a 50-70% stenosis in the left internal carotid artery and was seen by Dr. Curt Jews, with recommendations for conservative management.  He has never had a stroke or transient ischemic attack.  He has never smoked, does not have diabetes mellitus and has a generally favorable lipid profile (over the last 3 years total cholesterol 143-171, calculated LDL 74-96, HDL 51-55, normal triglycerides).  His blood pressure was elevated today but he was clearly uncomfortable in the cardiology office.  In July when he had his echocardiogram his blood pressure was 125/80 and at his visit with his primary care provider it was 142/84.  Normal renal function (creatinine 0.9-1.1).  Past Medical History:  Diagnosis Date  . Cancer ([place])    basal cell  . Carotid artery stenosis   . ED (erectile dysfunction)   . Elevated PSA     Past Surgical History:  Procedure Laterality Date  . EXTERNAL EAR SURGERY     melanoma removed  . HERNIA REPAIR      Current Medications: Current Meds  Medication Sig  . sildenafil (VIAGRA) 25 MG tablet Take by mouth.  . traZODone (  DESYREL) 50 MG tablet Take by mouth.     Allergies:   Patient has no known allergies.   Social History   Socioeconomic History  . Marital status: Single    Spouse name: Not on file  . Number of children: Not on file  . Years of education: Not on file  . Highest education level: Not on file  Occupational History  . Not on file  Social Needs  . Financial resource strain: Not on file  . Food insecurity:    Worry: Not on file    Inability: Not on file  . Transportation needs:    Medical: Not on file    Non-medical: Not on file  Tobacco Use    . Smoking status: Never Smoker  . Smokeless tobacco: Never Used  Substance and Sexual Activity  . Alcohol use: No  . Drug use: No  . Sexual activity: Not on file  Lifestyle  . Physical activity:    Days per week: Not on file    Minutes per session: Not on file  . Stress: Not on file  Relationships  . Social connections:    Talks on phone: Not on file    Gets together: Not on file    Attends religious service: Not on file    Active member of club or organization: Not on file    Attends meetings of clubs or organizations: Not on file    Relationship status: Not on file  Other Topics Concern  . Not on file  Social History Narrative  . Not on file     Family History: The patient's parents died in their 33s but he does not know the reason for their demise.  He has a sister with dementia.  He does not have any children. ROS:   Please see the history of present illness.    All other systems reviewed and are negative.  EKGs/Labs/Other Studies Reviewed:    The following studies were reviewed today: Echo report from July 2019, carotid duplex ultrasound and notes from vascular surgery July 2019, notes from primary care provider and labs also from July 2019  EKG:  EKG is not ordered today.  The ekg ordered 05/06/2018 demonstrates ectopic atrial bradycardia (negative P waves in leads II, III, aVF), left axis deviation almost meeting criteria for left anterior fascicular block (-39 degrees), QTC 450 ms  Recent Labs: January 19, 2018 hemoglobin 14.9, platelets 161, glucose 94, creatinine 1.1, potassium 4.4, normal liver function tests Recent Lipid Panel Total cholesterol 171, triglycerides 104, HDL 54, LDL 96  Physical Exam:    VS:  BP 130/78   Pulse (!) 54   Ht 5\' 9"  (1.753 m)   Wt 177 lb (80.3 kg)   SpO2 98%   BMI 26.14 kg/m     Wt Readings from Last 3 Encounters:  08/12/18 177 lb (80.3 kg)  05/05/18 173 lb (78.5 kg)  04/12/18 175 lb (79.4 kg)      General: Alert,  oriented x3, no distress, appears very lean and athletic, younger than stated age Head: no evidence of trauma, PERRL, EOMI, no exophtalmos or lid lag, no myxedema, no xanthelasma; normal ears, nose and oropharynx Neck: normal jugular venous pulsations and no hepatojugular reflux; brisk carotid pulses without delay but with bilateral carotid bruits radiating from the chest and Chest: clear to auscultation, no signs of consolidation by percussion or palpation, normal fremitus, symmetrical and full respiratory excursions Cardiovascular: normal position and quality of the apical impulse, regular  rhythm, normal first and second heart sounds, no diastolic murmurs, rubs or gallops.  There was a 3/6 mid peaking systolic ejection murmur heard in the aortic focus radiating to the carotids Abdomen: no tenderness or distention, no masses by palpation, no abnormal pulsatility or arterial bruits, normal bowel sounds, no hepatosplenomegaly Extremities: no clubbing, cyanosis or edema; 2+ radial, ulnar and brachial pulses bilaterally; 2+ right femoral, posterior tibial and dorsalis pedis pulses; 2+ left femoral, posterior tibial and dorsalis pedis pulses; no subclavian or femoral bruits Neurological: grossly nonfocal Psych: Normal mood and affect   ASSESSMENT:    1. Aortic valve stenosis, nonrheumatic   2. Sinus bradycardia   3. Carotid stenosis, right    PLAN:    In order of problems listed above:  1. AS: I think Mr. Gullo now has clear-cut symptoms of severe aortic stenosis and it is time to discuss valve replacement therapy.  I think he is an excellent candidate for either surgical or trans-arterial valve replacement.  We discussed the pros and cons of both approaches.  He is clearly not inclined to undergo major chest surgery, but is willing to discuss TAVR.  Discussed the fact that he would first have to have cardiac catheterization as well as a CT of the chest, abdomen and pelvis.  He would like to meet  with 1 of our structural heart disease specialists before scheduling the cardiac catheterization.  I do not think we have to stop him from exercising altogether, but I asked him to slow down the rate of his exercise and to promptly stop if he develops chest tightness or shortness of breath. 2. 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  Some degree of his bradycardia is due to high vagal tone, but it is surprising that he could not exceed 78% of the maximal predicted heart rate despite intense activity.  If his symptoms are not markedly improved after aortic valve replacement, he may benefit from a dual-chamber permanent pacemaker with a rate response (preferably device with minute ventilation sensor as well as with accelerometer). 3. Carotid stenosis: He is asymptomatic and does not require intervention at this time.  It does raise the possibility of other locations of atherosclerosis.  He will need cardiac catheterization and coronary angiography before aortic valve replacement anyway.  Having said that he has an excellent lipid profile with relatively low LDL cholesterol and excellent HDL.  He does not wish to embark on lipid-lowering therapy at this time.  He has a very healthy lifestyle.  Medication Adjustments/Labs and Tests Ordered: Current medicines are reviewed at length with the patient today.  Concerns regarding medicines are outlined above.  Orders Placed This Encounter  Procedures  . Ambulatory referral to Structural Heart/Valve Clinic (only at [place])   No orders of the defined types were placed in this encounter.   Patient Instructions  Medication Instructions:  Continue current medications If you need a refill on your cardiac medications before your next appointment, please call your pharmacy.    Follow-Up: At [facility], you and your health needs are our priority.  As part of our continuing mission to provide you  with exceptional heart care, we have created designated Provider Care Teams.  These Care Teams include your primary Cardiologist (physician) and Advanced Practice Providers (APPs -  Physician Assistants and Nurse Practitioners) who all work together to provide you with the care you need, when you need it. You will need a  follow up appointment in 6 months.  Please call our office 2 months in advance to schedule this appointment.  You may see Sanda Klein, MD or one of the following Advanced Practice Providers on your designated Care Team: [place], [place] . Fabian Sharp, PA-C  Any Other Special Instructions Will Be Listed Below (If Applicable).  Dr. Sallyanne Kuster has referred you to the structural heart clinic to discuss aortic valve replacement. You will see either Dr. Burt Knack or Dr. Angelena Form at our office on [address].       Signed, Sanda Klein, MD  08/12/2018 1:11 PM    [place]

## 2018-08-17 DIAGNOSIS — R972 Elevated prostate specific antigen [PSA]: Secondary | ICD-10-CM | POA: Diagnosis not present

## 2018-08-22 ENCOUNTER — Encounter: Payer: Self-pay | Admitting: Cardiovascular Disease

## 2018-08-22 ENCOUNTER — Ambulatory Visit: Payer: Medicare HMO | Admitting: Cardiovascular Disease

## 2018-08-22 VITALS — BP 156/78 | HR 55 | Ht 69.0 in | Wt 179.8 lb

## 2018-08-22 DIAGNOSIS — I350 Nonrheumatic aortic (valve) stenosis: Secondary | ICD-10-CM | POA: Diagnosis not present

## 2018-08-22 NOTE — Progress Notes (Addendum)
Chief Complaint  Patient presents with  . New Patient (Initial Visit)    severe aortic stenosis   History of Present Illness: 78 yo male with history of severe aortic stenosis who is here today as a new consult, referred by Dr. Sallyanne Kuster, for further evaluation of his aortic stenosis and discussion regarding possible TAVR. He tells me that he has been very healthy his entire life. He has never been hospitalized. He is a runner and is still running daily. He has begun to notice more fatigue with extreme exercise. No dyspnea on exertion or chest pain. No dizziness, near syncope or syncope. He was seen by Dr. Sallyanne Kuster October 2019 and was found to have severe aortic stenosis. Echo July 2019 in an outside office showed normal LV systolic function with HUDJ=49-70% and severe AS with mean gradient 25.5 mmHg with AVA of 0.8 cm2. Most recent echo in our office January 2020 with LVEF=60-65%, mild LVH. The aortic valve leaflets are thickened with limited leaflet mobility. Mean gradient 46 mmHg, peak gradient 81 mmHg, AVA 0.86 cm2, dimensionless index 0.28. Carotid artery dopplers July 2019 with 50-70% proximal left ICA stenosis.   He tells me today that he is still running 30-40 miles per week. He has run 150 marathons in his life. He has noticed more dyspnea with exertion and fatigue. This is progressive over the past few months. No chest pain. He is here today with his friend/partner. He is retired from [employer].   Primary Care Physician: [name], MD Primary Cardiologist: Dr. Sallyanne Kuster Referring Cardiologist: Dr. Sallyanne Kuster  Past Medical History:  Diagnosis Date  . Aortic stenosis   . Cancer ([place])    basal cell  . Carotid artery stenosis   . ED (erectile dysfunction)   . Elevated PSA     Past Surgical History:  Procedure Laterality Date  . EXTERNAL EAR SURGERY     melanoma removed  . HERNIA REPAIR      Current Outpatient Medications  Medication Sig Dispense Refill  . sildenafil  (VIAGRA) 25 MG tablet Take by mouth.    . traZODone (DESYREL) 50 MG tablet Take by mouth.     No current facility-administered medications for this visit.     No Known Allergies  Social History   Socioeconomic History  . Marital status: Single    Spouse name: Not on file  . Number of children: 0  . Years of education: Not on file  . Highest education level: Not on file  Occupational History  . Occupation: Set designer  . Financial resource strain: Not on file  . Food insecurity:    Worry: Not on file    Inability: Not on file  . Transportation needs:    Medical: Not on file    Non-medical: Not on file  Tobacco Use  . Smoking status: Never Smoker  . Smokeless tobacco: Never Used  Substance and Sexual Activity  . Alcohol use: No  . Drug use: No  . Sexual activity: Not on file  Lifestyle  . Physical activity:    Days per week: Not on file    Minutes per session: Not on file  . Stress: Not on file  Relationships  . Social connections:    Talks on phone: Not on file    Gets together: Not on file    Attends religious service: Not on file    Active member of club or organization: Not on file    Attends meetings of  clubs or organizations: Not on file    Relationship status: Not on file  . Intimate partner violence:    Fear of current or ex partner: Not on file    Emotionally abused: Not on file    Physically abused: Not on file    Forced sexual activity: Not on file  Other Topics Concern  . Not on file  Social History Narrative  . Not on file    Family History  Problem Relation Age of Onset  . Heart disease Brother     Review of Systems:  As stated in the HPI and otherwise negative.   BP (!) 156/78   Pulse (!) 55   Ht 5\' 9"  (1.753 m)   Wt 179 lb 12.8 oz (81.6 kg)   SpO2 97%   BMI 26.55 kg/m   Physical Examination: General: Well developed, well nourished, NAD  HEENT: OP clear, mucus membranes moist  SKIN: warm, dry. No  rashes. Neuro: No focal deficits  Musculoskeletal: Muscle strength 5/5 all ext  Psychiatric: Mood and affect normal  Neck: No JVD, no carotid bruits, no thyromegaly, no lymphadenopathy.  Lungs:Clear bilaterally, no wheezes, rhonci, crackles Cardiovascular: Regular rate and rhythm. Loud, harsh systolic murmur.  Abdomen:Soft. Bowel sounds present. Non-tender.  Extremities: No lower extremity edema. Pulses are 2 + in the bilateral DP/PT.  Echo 08/09/18:   1. The left ventricle appears to be mildly increased in size, have normal wall thickness, with 60-65%. Echo evidence of impaired relaxation in diastolic filling patterns.  2. Right ventricular systolic pressure is is moderately elevated.  3. The right ventricle is normal in size, has normal wall thickness and normal systolic function.  4. Normal left atrial size.  5. Normal right atrial size.  6. The mitral valve is degenerative.  7. Normal tricuspid valve.  8. Aortic valve regurgitation is moderate by color flow Doppler.  9. Aortic valve tricuspid. 10. Severe stenosis of the aortic valve. 11. There is moderate sclerosis of the aortic valve. 12. There is severe thickening of the aortic valve. 13. No atrial level shunt detected by color flow Doppler.  FINDINGS  Left Ventricle: The left ventricle appears to be mildly increased in size, have normal wall thickness, with normal systolic function with a 94-85%. Echo evidence of impaired relaxation in diastolic filling patterns. Right Ventricle: The right ventricle is normal in size, has normal wall thickness and normal systolic function. Right ventricular systolic pressure is is moderately elevated with an estimated pressure of 42.3 mmHg. Left Atrium: The left atrium is normal in size. Right Atrium: The right atrial size is normal in size. Interatrial Septum: No atrial level shunt detected by color flow Doppler.  Pericardium: There is no evidence of pericardial effusion. Mitral Valve: The  mitral valve is degenerative in appearance. There is mild thickening of the mitral valve. Tricuspid Valve: The tricuspid valve is normal in structure. Tricuspid regurgitation was not visualized by color flow Doppler. The tricuspid regurgitant velocity is 2.93 m/s, and with an estimated right atrial pressure of 8 mmHg, the right ventricular  systolic pressure is mildly elevated at 42.3 mmHg. Aortic Valve: The aortic valve tricuspid. There is severe thickening of the aortic valve with moderately decreased cusp excursion. There is moderate sclerosis of the aortic valve with moderately decreased cusp excursion. The calculated aortic valve area  is 0.87 cm, consistent with severe stenosis. Aortic valve regurgitation is moderate by color flow Doppler with a pressure half time of 592 msec. Pulmonic Valve: The pulmonic valve  is normal. Pulmonic valve regurgitation is not visualized by color flow Doppler. Venous: The inferior vena cava was normal in size with greater than 50% respiratory variablity.   LEFT VENTRICLE PLAX 2D (Teich) LV EF:          54.4 %   Diastology LVIDd:          5.60 cm  LV e' lateral:   0.1 m/s LVIDs:          4.00 cm  LV E/e' lateral: 12.0 LV PW:          0.90 cm  LV e' medial:    0.0 m/s LV IVS:         0.80 cm  LV E/e' medial:  14.2 LVOT diam:      2.00 cm LV SV:          84 ml LVOT Area:      3.14 cm  RIGHT VENTRICLE RV S prime:     0.14 m/s TAPSE (M-mode): 2.8 cm RVSP:           42.3 mmHg  LEFT ATRIUM              Index       RIGHT ATRIUM LA diam:        4.20 cm  2.16 cm/m  RA Pressure: 8 mmHg LA Vol (A2C):   109.0 ml 56.11 ml/m LA Vol (A4C):   77.6 ml  39.95 ml/m LA Biplane Vol: 97.1 ml  49.99 ml/m  AORTIC VALVE AV Area (Vmax):    0.99 cm AV Area (Vmean):   0.86 cm AV Area (VTI):     0.87 cm AV Vmax:           4.52 m/s AV Vmean:          3.175 m/s AV VTI:            1.108 m AV Peak Grad:      81.9 mmHg AV Mean Grad:      46.0 mmHg LVOT Vmax:          1.42 m/s LVOT Vmean:        0.874 m/s LVOT VTI:          0.307 m LVOT/AV VTI ratio: 0.28 AR PHT:            592 msec   AORTA Ao Root diam: 3.60 cm Ao Asc diam:  3.40 cm  MITRAL VALVE               TR Peak grad: 34.3 mmHg MV Area (PHT): 2.20 cm    TR Vmax:      2.93 m/s MV PHT:        100.05 msec RAP:          8 mmHg MV Decel Time: 345 msec    RVSP:         42.3 mmHg MV E velocity: 0.62 m/s MV A velocity: 0.65 m/s MV E/A ratio:  0.95  EKG:  EKG is ordered today. The ekg ordered today demonstrates Sinus bradycardia, rate 55 bpm.   Recent Labs: No results found for requested labs within last 8760 hours.    Wt Readings from Last 3 Encounters:  08/22/18 179 lb 12.8 oz (81.6 kg)  08/12/18 177 lb (80.3 kg)  05/05/18 173 lb (78.5 kg)     Other studies Reviewed: Additional studies/ records that were reviewed today include: office notes, EKG Review of the above records demonstrates:  Assessment and Plan:   1. Severe aortic stenosis: He has severe, stage D aortic valve stenosis. I reviewed the echo images today. The aortic valve is thickened, calcified with limited leaflet mobility. I think he would benefit from AVR. He is a very functional 78 yo male and would be low risk for surgical AVR or TAVR. Given advanced age, we will consider TAVR.   STS Risk Score:  Procedure: Isolated AVR  Risk of Mortality: 0.835%  Renal Failure: 0.590%  Permanent Stroke: 1.060%  Prolonged Ventilation: 2.892%  DSW Infection: 0.069%  Reoperation: 3.748%  Morbidity or Mortality: 6.060%  Short Length of Stay: 56.720%  Long Length of Stay: 2.137%    I have reviewed the natural history of aortic stenosis with the patient and their family members  who are present today. We have discussed the limitations of medical therapy and the poor prognosis associated with symptomatic aortic stenosis. We have reviewed potential treatment options, including palliative medical therapy, conventional surgical aortic  valve replacement, and transcatheter aortic valve replacement. We discussed treatment options in the context of the patient's specific comorbid medical conditions.   He would like to discuss how he proceeds with family. He is considering obtaining another opinion at [facility]. He will call back to arrange a cardiac cath if he decides to move forward.  Risks and benefits of the cath procedure and the TAVR procedure are reviewed with the patient.     Current medicines are reviewed at length with the patient today.  The patient does not have concerns regarding medicines.  The following changes have been made:  no change  Labs/ tests ordered today include:  No orders of the defined types were placed in this encounter.    Disposition:   FU with the TAVR team.    Signed, Lauree Chandler, MD 08/22/2018 3:33 PM    [address], [place], [place]  [address] Phone: [phone]; Fax: [phone]

## 2018-08-24 DIAGNOSIS — R972 Elevated prostate specific antigen [PSA]: Secondary | ICD-10-CM | POA: Diagnosis not present

## 2018-08-24 DIAGNOSIS — N401 Enlarged prostate with lower urinary tract symptoms: Secondary | ICD-10-CM | POA: Diagnosis not present

## 2018-08-24 DIAGNOSIS — R351 Nocturia: Secondary | ICD-10-CM | POA: Diagnosis not present

## 2018-08-26 ENCOUNTER — Telehealth: Payer: Self-pay | Admitting: Cardiovascular Disease

## 2018-08-26 DIAGNOSIS — Z01812 Encounter for preprocedural laboratory examination: Secondary | ICD-10-CM

## 2018-08-26 DIAGNOSIS — I350 Nonrheumatic aortic (valve) stenosis: Secondary | ICD-10-CM

## 2018-08-26 NOTE — Telephone Encounter (Signed)
Pt called and wanted to speak strictly to Enis Slipper. He wants to discuss the TAVR Scheduling process with her, and he said Dr. Sunday Corn him Pat's name specifically

## 2018-08-31 ENCOUNTER — Encounter: Payer: Self-pay | Admitting: *Deleted

## 2018-08-31 NOTE — Telephone Encounter (Signed)
I spoke with pt. He is unable to have cath done on March 12.  Dr. Angelena Form is in cath lab again on March 19 and 20.  Both of these days are good for pt.  I told him I would call him back once procedure is scheduled.

## 2018-08-31 NOTE — Telephone Encounter (Signed)
I spoke with pt and verbally went over all instructions for cath on March 19,2020.  Will mail copy of instructions to him.  He will come in for lab work on March 16,2020

## 2018-08-31 NOTE — Telephone Encounter (Signed)
Follow Up:        Pt said he had just talked to you earlier, he needs you to call him again please.

## 2018-08-31 NOTE — Telephone Encounter (Signed)
Left message to call back  

## 2018-08-31 NOTE — Telephone Encounter (Signed)
That day is fine with me. Let me know when you put him on the schedule and I will place cath orders. Thanks, chris

## 2018-08-31 NOTE — Telephone Encounter (Signed)
I called cath lab and scheduled procedure for March 19,2020 at 7:30

## 2018-08-31 NOTE — Telephone Encounter (Signed)
I spoke with pt. He would like to proceed with cardiac cath.  He would like to wait until March. Dr. Angelena Form is in cath lab lab on March 4 but pt is unavailable that day.  Next day Dr. Angelena Form is in cath lab is March 12.  Pt can do this day.  I told pt I would send message to Dr. Angelena Form and if OK with him to do cath that day I would call him back to schedule.

## 2018-09-02 NOTE — Addendum Note (Signed)
Addended by: Lauree Chandler D on: 09/02/2018 01:10 PM   Modules accepted: Orders, SmartSet

## 2018-09-02 NOTE — Telephone Encounter (Signed)
Cath orders placed for 09/29/18. thanks

## 2018-09-07 ENCOUNTER — Telehealth: Payer: Self-pay | Admitting: Cardiovascular Disease

## 2018-09-07 NOTE — Telephone Encounter (Signed)
°  Patient calling to schedule TAVR

## 2018-09-07 NOTE — Telephone Encounter (Signed)
Left message on machine for pt to contact the office.   

## 2018-09-07 NOTE — Telephone Encounter (Signed)
I spoke with the pt and he wanted to discuss timing for additional testing after cardiac cath.  The pt also states that he saw his dentist yesterday and he has a tooth that is infected and he broke another tooth that needs to be fixed.  I advised the pt that he needs to follow-up with the dentist and have these issues treated prior to proceeding with heart valve surgery.  The pt plans to contact the dental clinic at [facility] and he will contact me once apts have been arranged. I advised the pt to contact me with any additional questions in regards to TAVR evaluation.

## 2018-09-26 ENCOUNTER — Other Ambulatory Visit: Payer: Medicare HMO | Admitting: *Deleted

## 2018-09-26 ENCOUNTER — Other Ambulatory Visit: Payer: Self-pay

## 2018-09-26 DIAGNOSIS — Z01812 Encounter for preprocedural laboratory examination: Secondary | ICD-10-CM

## 2018-09-26 DIAGNOSIS — I350 Nonrheumatic aortic (valve) stenosis: Secondary | ICD-10-CM

## 2018-09-26 LAB — CBC
Hematocrit: 40.4 % (ref 37.5–51.0)
Hemoglobin: 14.2 g/dL (ref 13.0–17.7)
MCH: 33.9 pg — ABNORMAL HIGH (ref 26.6–33.0)
MCHC: 35.1 g/dL (ref 31.5–35.7)
MCV: 96 fL (ref 79–97)
Platelets: 179 10*3/uL (ref 150–450)
RBC: 4.19 x10E6/uL (ref 4.14–5.80)
RDW: 11.8 % (ref 11.6–15.4)
WBC: 6 10*3/uL (ref 3.4–10.8)

## 2018-09-26 LAB — BASIC METABOLIC PANEL
BUN/Creatinine Ratio: 24 (ref 10–24)
BUN: 25 mg/dL (ref 8–27)
CO2: 23 mmol/L (ref 20–29)
Calcium: 9 mg/dL (ref 8.6–10.2)
Chloride: 103 mmol/L (ref 96–106)
Creatinine, Ser: 1.03 mg/dL (ref 0.76–1.27)
GFR calc Af Amer: 80 mL/min/{1.73_m2} (ref >59)
GFR calc non Af Amer: 69 mL/min/{1.73_m2} (ref >59)
Glucose: 87 mg/dL (ref 65–99)
Potassium: 4.7 mmol/L (ref 3.5–5.2)
Sodium: 142 mmol/L (ref 134–144)

## 2018-09-27 ENCOUNTER — Telehealth: Payer: Self-pay | Admitting: *Deleted

## 2018-09-27 NOTE — Telephone Encounter (Signed)
Pt contacted pre-catheterization scheduled at [facility] for: Thursday September 29, 2018 7:30 AM Verified arrival time and place: [place] Entrance A at: 5:30 AM  No solid food after midnight prior to cath, clear liquids until 5 AM day of procedure. Contrast allergy: no  AM meds can be  taken pre-cath with sip of water including: ASA 81 mg  Confirmed patient has responsible person to drive home post procedure and observe 24 hours after arriving home: yes

## 2018-09-28 ENCOUNTER — Telehealth: Payer: Self-pay

## 2018-09-28 NOTE — Telephone Encounter (Signed)
  [facility]   MULTIDISCIPLINARY HEART VALVE TEAM  Pt scheduled for Cardiac Catheterization on 09/29/2018 with Dr Angelena Form as part of pre TAVR evaluation. Pt has severe symptomatic aortic stenosis and the team is okay with proceeding as scheduled as long as the pt is aware of potential risk of exposure to Covid-19.  Pt aware and would like to proceed so that additional data can be obtained and the pt can be triaged in regards to how we proceed with additional TAVR evaluation

## 2018-09-29 ENCOUNTER — Ambulatory Visit (HOSPITAL_COMMUNITY)
Admission: RE | Admit: 2018-09-29 | Discharge: 2018-09-29 | Disposition: A | Discharge status: Home / Self Care | Payer: Medicare HMO | Source: Ambulatory Visit | Attending: Cardiovascular Disease | Admitting: Cardiovascular Disease

## 2018-09-29 ENCOUNTER — Encounter (HOSPITAL_COMMUNITY): Payer: Self-pay | Admitting: Cardiovascular Disease

## 2018-09-29 ENCOUNTER — Other Ambulatory Visit: Payer: Self-pay

## 2018-09-29 ENCOUNTER — Encounter (HOSPITAL_COMMUNITY)
Admission: RE | Disposition: A | Discharge status: Home / Self Care | Payer: Self-pay | Source: Ambulatory Visit | Attending: Cardiovascular Disease

## 2018-09-29 DIAGNOSIS — I350 Nonrheumatic aortic (valve) stenosis: Secondary | ICD-10-CM | POA: Diagnosis not present

## 2018-09-29 DIAGNOSIS — Z79899 Other long term (current) drug therapy: Secondary | ICD-10-CM | POA: Insufficient documentation

## 2018-09-29 DIAGNOSIS — I2510 Atherosclerotic heart disease of native coronary artery without angina pectoris: Secondary | ICD-10-CM | POA: Diagnosis not present

## 2018-09-29 HISTORY — PX: RIGHT/LEFT HEART CATH AND CORONARY ANGIOGRAPHY: CATH118266

## 2018-09-29 LAB — POCT I-STAT 7, (LYTES, BLD GAS, ICA,H+H)
Bicarbonate: 25.4 mmol/L (ref 20.0–28.0)
Calcium, Ion: 1.19 mmol/L (ref 1.15–1.40)
HCT: 37 % — ABNORMAL LOW (ref 39.0–52.0)
Hemoglobin: 12.6 g/dL — ABNORMAL LOW (ref 13.0–17.0)
O2 Saturation: 98 %
Potassium: 3.9 mmol/L (ref 3.5–5.1)
Sodium: 139 mmol/L (ref 135–145)
TCO2: 27 mmol/L (ref 22–32)
pCO2 arterial: 42.2 mmHg (ref 32.0–48.0)
pH, Arterial: 7.387 (ref 7.350–7.450)
pO2, Arterial: 99 mmHg (ref 83.0–108.0)

## 2018-09-29 LAB — POCT I-STAT EG7
Acid-Base Excess: 1 mmol/L (ref 0.0–2.0)
Bicarbonate: 27.1 mmol/L (ref 20.0–28.0)
Calcium, Ion: 1.2 mmol/L (ref 1.15–1.40)
HCT: 39 % (ref 39.0–52.0)
Hemoglobin: 13.3 g/dL (ref 13.0–17.0)
O2 Saturation: 74 %
Potassium: 4 mmol/L (ref 3.5–5.1)
Sodium: 140 mmol/L (ref 135–145)
TCO2: 29 mmol/L (ref 22–32)
pCO2, Ven: 47.8 mmHg (ref 44.0–60.0)
pH, Ven: 7.362 (ref 7.250–7.430)
pO2, Ven: 41 mmHg (ref 32.0–45.0)

## 2018-09-29 SURGERY — RIGHT/LEFT HEART CATH AND CORONARY ANGIOGRAPHY
Anesthesia: LOCAL

## 2018-09-29 MED ORDER — SODIUM CHLORIDE 0.9 % IV SOLN: 250.0000 mL | INTRAVENOUS | Status: DC | PRN

## 2018-09-29 MED ORDER — FENTANYL CITRATE (PF) 100 MCG/2ML IJ SOLN
INTRAMUSCULAR | Status: DC | PRN
  Administered 2018-09-29 (×2): 25 ug via INTRAVENOUS

## 2018-09-29 MED ORDER — SODIUM CHLORIDE 0.9 % IV SOLN: INTRAVENOUS | Status: AC

## 2018-09-29 MED ORDER — MIDAZOLAM HCL 2 MG/2ML IJ SOLN
INTRAMUSCULAR | Status: DC | PRN
  Administered 2018-09-29 (×2): 1 mg via INTRAVENOUS

## 2018-09-29 MED ORDER — HEPARIN SODIUM (PORCINE) 1000 UNIT/ML IJ SOLN
INTRAMUSCULAR | Status: DC | PRN
  Administered 2018-09-29: 4000 [IU] via INTRAVENOUS

## 2018-09-29 MED ORDER — MIDAZOLAM HCL 2 MG/2ML IJ SOLN
INTRAMUSCULAR | Status: AC
  Filled 2018-09-29: qty 2

## 2018-09-29 MED ORDER — HEPARIN (PORCINE) IN NACL 1000-0.9 UT/500ML-% IV SOLN
INTRAVENOUS | Status: AC
  Filled 2018-09-29: qty 1000

## 2018-09-29 MED ORDER — HEPARIN (PORCINE) IN NACL 1000-0.9 UT/500ML-% IV SOLN
INTRAVENOUS | Status: DC | PRN
  Administered 2018-09-29 (×2): 500 mL

## 2018-09-29 MED ORDER — LIDOCAINE HCL (PF) 1 % IJ SOLN
INTRAMUSCULAR | Status: AC
  Filled 2018-09-29: qty 30

## 2018-09-29 MED ORDER — SODIUM CHLORIDE 0.9% FLUSH: 3.0000 mL | Freq: Two times a day (BID) | INTRAVENOUS | Status: DC

## 2018-09-29 MED ORDER — SODIUM CHLORIDE 0.9% FLUSH: 3.0000 mL | INTRAVENOUS | Status: DC | PRN

## 2018-09-29 MED ORDER — VERAPAMIL HCL 2.5 MG/ML IV SOLN
INTRAVENOUS | Status: DC | PRN
  Administered 2018-09-29: 10 mL via INTRA_ARTERIAL

## 2018-09-29 MED ORDER — ASPIRIN 81 MG PO CHEW: 81.0000 mg | CHEWABLE_TABLET | ORAL | Status: AC

## 2018-09-29 MED ORDER — FENTANYL CITRATE (PF) 100 MCG/2ML IJ SOLN
INTRAMUSCULAR | Status: AC
  Filled 2018-09-29: qty 2

## 2018-09-29 MED ORDER — LIDOCAINE HCL (PF) 1 % IJ SOLN
INTRAMUSCULAR | Status: DC | PRN
  Administered 2018-09-29: 5 mL
  Administered 2018-09-29: 2 mL

## 2018-09-29 MED ORDER — SODIUM CHLORIDE 0.9 % IV SOLN
INTRAVENOUS | Status: AC
  Administered 2018-09-29: 06:00:00 via INTRAVENOUS

## 2018-09-29 MED ORDER — ONDANSETRON HCL 4 MG/2ML IJ SOLN: 4.0000 mg | Freq: Four times a day (QID) | INTRAMUSCULAR | Status: DC | PRN

## 2018-09-29 MED ORDER — ACETAMINOPHEN 325 MG PO TABS: 650.0000 mg | ORAL_TABLET | ORAL | Status: DC | PRN

## 2018-09-29 MED ORDER — VERAPAMIL HCL 2.5 MG/ML IV SOLN
INTRAVENOUS | Status: AC
  Filled 2018-09-29: qty 2

## 2018-09-29 MED ORDER — IOHEXOL 350 MG/ML SOLN
INTRAVENOUS | Status: DC | PRN
  Administered 2018-09-29: 80 mL via INTRA_ARTERIAL

## 2018-09-29 SURGICAL SUPPLY — 13 items
CATH 5FR JL3.5 JR4 ANG PIG MP (CATHETERS) ×2 IMPLANT
CATH BALLN WEDGE 5F 110CM (CATHETERS) ×2 IMPLANT
CATH INFINITI 5FR AL1 (CATHETERS) ×2 IMPLANT
DEVICE RAD COMP TR BAND LRG (VASCULAR PRODUCTS) ×2 IMPLANT
GLIDESHEATH SLEND SS 6F .021 (SHEATH) ×2 IMPLANT
GUIDEWIRE INQWIRE 1.5J.035X260 (WIRE) ×1 IMPLANT
INQWIRE 1.5J .035X260CM (WIRE) ×2
KIT HEART LEFT (KITS) ×2 IMPLANT
PACK CARDIAC CATHETERIZATION (CUSTOM PROCEDURE TRAY) ×2 IMPLANT
SHEATH GLIDE SLENDER 4/5FR (SHEATH) ×2 IMPLANT
TRANSDUCER W/STOPCOCK (MISCELLANEOUS) ×2 IMPLANT
TUBING CIL FLEX 10 FLL-RA (TUBING) ×2 IMPLANT
WIRE EMERALD ST .035X260CM (WIRE) ×2 IMPLANT

## 2018-09-29 NOTE — Discharge Instructions (Signed)
Drink plenty of fluids °Keep right arm at or above heart level  °Radial Site Care ° °This sheet gives you information about how to care for yourself after your procedure. Your health care provider may also give you more specific instructions. If you have problems or questions, contact your health care provider. °What can I expect after the procedure? °After the procedure, it is common to have: °· Bruising and tenderness at the catheter insertion area. °Follow these instructions at home: °Medicines °· Take over-the-counter and prescription medicines only as told by your health care provider. °Insertion site care °· Follow instructions from your health care provider about how to take care of your insertion site. Make sure you: °? Wash your hands with soap and water before you change your bandage (dressing). If soap and water are not available, use hand sanitizer. °? Change your dressing as told by your health care provider. °? Leave stitches (sutures), skin glue, or adhesive strips in place. These skin closures may need to stay in place for 2 weeks or longer. If adhesive strip edges start to loosen and curl up, you may trim the loose edges. Do not remove adhesive strips completely unless your health care provider tells you to do that. °· Check your insertion site every day for signs of infection. Check for: °? Redness, swelling, or pain. °? Fluid or blood. °? Pus or a bad smell. °? Warmth. °· Do not take baths, swim, or use a hot tub until your health care provider approves. °· You may shower 24-48 hours after the procedure, or as directed by your health care provider. °? Remove the dressing and gently wash the site with plain soap and water. °? Pat the area dry with a clean towel. °? Do not rub the site. That could cause bleeding. °· Do not apply powder or lotion to the site. °Activity ° °· For 24 hours after the procedure, or as directed by your health care provider: °? Do not flex or bend the affected arm. °? Do  not push or pull heavy objects with the affected arm. °? Do not drive yourself home from the hospital or clinic. You may drive 24 hours after the procedure unless your health care provider tells you not to. °? Do not operate machinery or power tools. °· Do not lift anything that is heavier than 10 lb (4.5 kg), or the limit that you are told, until your health care provider says that it is safe. °· Ask your health care provider when it is okay to: °? Return to work or school. °? Resume usual physical activities or sports. °? Resume sexual activity. °General instructions °· If the catheter site starts to bleed, raise your arm and put firm pressure on the site. If the bleeding does not stop, get help right away. This is a medical emergency. °· If you went home on the same day as your procedure, a responsible adult should be with you for the first 24 hours after you arrive home. °· Keep all follow-up visits as told by your health care provider. This is important. °Contact a health care provider if: °· You have a fever. °· You have redness, swelling, or yellow drainage around your insertion site. °Get help right away if: °· You have unusual pain at the radial site. °· The catheter insertion area swells very fast. °· The insertion area is bleeding, and the bleeding does not stop when you hold steady pressure on the area. °· Your arm or hand   hand becomes pale, cool, tingly, or numb. °These symptoms may represent a serious problem that is an emergency. Do not wait to see if the symptoms will go away. Get medical help right away. Call your local emergency services (911 in the [place] Do not drive yourself to the hospital. °Summary °· After the procedure, it is common to have bruising and tenderness at the site. °· Follow instructions from your health care provider about how to take care of your radial site wound. Check the wound every day for signs of infection. °· Do not lift anything that is heavier than 10 lb (4.5 kg), or the  limit that you are told, until your health care provider says that it is safe. °This information is not intended to replace advice given to you by your health care provider. Make sure you discuss any questions you have with your health care provider. °Document Released: 08/01/2010 Document Revised: 08/04/2017 Document Reviewed: 08/04/2017 °Elsevier Interactive Patient Education © 2019 [employer] ° °

## 2018-09-29 NOTE — Progress Notes (Signed)
TY MCr

## 2018-09-29 NOTE — H&P (Signed)
History of Present Illness: 78 yo male with history of severe aortic stenosis who is here today for cardiac cath. He was seen recently in my office for further evaluation of his aortic stenosis and discussion regarding possible TAVR. He tells me that he has been very healthy his entire life. He has never been hospitalized. He is a runner and is still running daily. He has begun to notice more fatigue with extreme exercise. No dyspnea on exertion or chest pain. No dizziness, near syncope or syncope. He was seen by Dr. Sallyanne Kuster October 2019 and was found to have severe aortic stenosis. Echo July 2019 in an outside office showed normal LV systolic function with PPIR=51-88% and severe AS with mean gradient 25.5 mmHg with AVA of 0.8 cm2. Most recent echo in our office January 2020 with LVEF=60-65%, mild LVH. The aortic valve leaflets are thickened with limited leaflet mobility. Mean gradient 46 mmHg, peak gradient 81 mmHg, AVA 0.86 cm2, dimensionless index 0.28. Carotid artery dopplers July 2019 with 50-70% proximal left ICA stenosis.   He tells me today that he is still running 30-40 miles per week. He has run 150 marathons in his life. He has noticed more dyspnea with exertion and fatigue. This is progressive over the past few months. No chest pain. He is here today with his friend/partner. He is retired from [employer].   Primary Care Physician: [name], MD Primary Cardiologist: Dr. Sallyanne Kuster Referring Cardiologist: Dr. Sallyanne Kuster      Past Medical History:  Diagnosis Date  . Aortic stenosis   . Cancer ([place])    basal cell  . Carotid artery stenosis   . ED (erectile dysfunction)   . Elevated PSA          Past Surgical History:  Procedure Laterality Date  . EXTERNAL EAR SURGERY     melanoma removed  . HERNIA REPAIR            Current Outpatient Medications  Medication Sig Dispense Refill  . sildenafil (VIAGRA) 25 MG tablet Take by mouth.    . traZODone  (DESYREL) 50 MG tablet Take by mouth.     No current facility-administered medications for this visit.     No Known Allergies  Social History        Socioeconomic History  . Marital status: Single    Spouse name: Not on file  . Number of children: 0  . Years of education: Not on file  . Highest education level: Not on file  Occupational History  . Occupation: Set designer  . Financial resource strain: Not on file  . Food insecurity:    Worry: Not on file    Inability: Not on file  . Transportation needs:    Medical: Not on file    Non-medical: Not on file  Tobacco Use  . Smoking status: Never Smoker  . Smokeless tobacco: Never Used  Substance and Sexual Activity  . Alcohol use: No  . Drug use: No  . Sexual activity: Not on file  Lifestyle  . Physical activity:    Days per week: Not on file    Minutes per session: Not on file  . Stress: Not on file  Relationships  . Social connections:    Talks on phone: Not on file    Gets together: Not on file    Attends religious service: Not on file    Active member of club or organization: Not on file  Attends meetings of clubs or organizations: Not on file    Relationship status: Not on file  . Intimate partner violence:    Fear of current or ex partner: Not on file    Emotionally abused: Not on file    Physically abused: Not on file    Forced sexual activity: Not on file  Other Topics Concern  . Not on file  Social History Narrative  . Not on file         Family History  Problem Relation Age of Onset  . Heart disease Brother     Review of Systems:  As stated in the HPI and otherwise negative.   Today's Vitals   09/29/18 0542 09/29/18 0556  BP: (!) 146/77   Pulse: (!) 53   Temp: 98.4 F (36.9 C)   TempSrc: Oral   SpO2: 100%   Weight: 77.1 kg   Height: 5\' 9"  (1.753 m)   PainSc:  0-No pain   Body mass index is 25.1 kg/m.    Physical Examination: General: Well developed, well nourished, NAD  HEENT: OP clear, mucus membranes moist  SKIN: warm, dry. No rashes. Neuro: No focal deficits  Musculoskeletal: Muscle strength 5/5 all ext  Psychiatric: Mood and affect normal  Neck: No JVD, no carotid bruits, no thyromegaly, no lymphadenopathy.  Lungs:Clear bilaterally, no wheezes, rhonci, crackles Cardiovascular: Regular rate and rhythm. Loud murmur.  Abdomen:Soft. Bowel sounds present. Non-tender.  Extremities: No lower extremity edema. Pulses are 2 + in the bilateral DP/PT.   Echo 08/09/18:  1. The left ventricle appears to be mildly increased in size, have normal wall thickness, with 60-65%. Echo evidence of impaired relaxation in diastolic filling patterns. 2. Right ventricular systolic pressure is is moderately elevated. 3. The right ventricle is normal in size, has normal wall thickness and normal systolic function. 4. Normal left atrial size. 5. Normal right atrial size. 6. The mitral valve is degenerative. 7. Normal tricuspid valve. 8. Aortic valve regurgitation is moderate by color flow Doppler. 9. Aortic valve tricuspid. 10. Severe stenosis of the aortic valve. 11. There is moderate sclerosis of the aortic valve. 12. There is severe thickening of the aortic valve. 13. No atrial level shunt detected by color flow Doppler.  FINDINGS Left Ventricle: The left ventricle appears to be mildly increased in size, have normal wall thickness, with normal systolic function with a 00-93%. Echo evidence of impaired relaxation in diastolic filling patterns. Right Ventricle: The right ventricle is normal in size, has normal wall thickness and normal systolic function. Right ventricular systolic pressure is is moderately elevated with an estimated pressure of 42.3 mmHg. Left Atrium: The left atrium is normal in size. Right Atrium: The right atrial size is normal in size. Interatrial Septum: No atrial level  shunt detected by color flow Doppler.  Pericardium: There is no evidence of pericardial effusion. Mitral Valve: The mitral valve is degenerative in appearance. There is mild thickening of the mitral valve. Tricuspid Valve: The tricuspid valve is normal in structure. Tricuspid regurgitation was not visualized by color flow Doppler. The tricuspid regurgitant velocity is 2.93 m/s, and with an estimated right atrial pressure of 8 mmHg, the right ventricular  systolic pressure is mildly elevated at 42.3 mmHg. Aortic Valve: The aortic valve tricuspid. There is severe thickening of the aortic valve with moderately decreased cusp excursion. There is moderate sclerosis of the aortic valve with moderately decreased cusp excursion. The calculated aortic valve area  is 0.87 cm, consistent with severe stenosis.  Aortic valve regurgitation is moderate by color flow Doppler with a pressure half time of 592 msec. Pulmonic Valve: The pulmonic valve is normal. Pulmonic valve regurgitation is not visualized by color flow Doppler. Venous: The inferior vena cava was normal in size with greater than 50% respiratory variablity.  LEFT VENTRICLE PLAX 2D (Teich) LV EF: 54.4 % Diastology LVIDd: 5.60 cm LV e' lateral: 0.1 m/s LVIDs: 4.00 cm LV E/e' lateral: 12.0 LV PW: 0.90 cm LV e' medial: 0.0 m/s LV IVS: 0.80 cm LV E/e' medial: 14.2 LVOT diam: 2.00 cm LV SV: 84 ml LVOT Area: 3.14 cm  RIGHT VENTRICLE RV S prime: 0.14 m/s TAPSE (M-mode): 2.8 cm RVSP: 42.3 mmHg  LEFT ATRIUM Index RIGHT ATRIUM LA diam: 4.20 cm 2.16 cm/m RA Pressure: 8 mmHg LA Vol (A2C): 109.0 ml 56.11 ml/m LA Vol (A4C): 77.6 ml 39.95 ml/m LA Biplane Vol: 97.1 ml 49.99 ml/m AORTIC VALVE AV Area (Vmax): 0.99 cm AV Area (Vmean): 0.86 cm AV Area (VTI): 0.87 cm AV Vmax: 4.52 m/s AV Vmean:  3.175 m/s AV VTI: 1.108 m AV Peak Grad: 81.9 mmHg AV Mean Grad: 46.0 mmHg LVOT Vmax: 1.42 m/s LVOT Vmean: 0.874 m/s LVOT VTI: 0.307 m LVOT/AV VTI ratio: 0.28 AR PHT: 592 msec  AORTA Ao Root diam: 3.60 cm Ao Asc diam: 3.40 cm  MITRAL VALVE TR Peak grad: 34.3 mmHg MV Area (PHT): 2.20 cm TR Vmax: 2.93 m/s MV PHT: 100.05 msec RAP: 8 mmHg MV Decel Time: 345 msec RVSP: 42.3 mmHg MV E velocity: 0.62 m/s MV A velocity: 0.65 m/s MV E/A ratio: 0.95  EKG:  EKG is not ordered today.   Recent Labs: No results found for requested labs within last 8760 hours.       Wt Readings from Last 3 Encounters:  08/22/18 179 lb 12.8 oz (81.6 kg)  08/12/18 177 lb (80.3 kg)  05/05/18 173 lb (78.5 kg)     Other studies Reviewed: Additional studies/ records that were reviewed today include: office notes, EKG Review of the above records demonstrates:    Assessment and Plan:   1. Severe aortic stenosis: Cardiac cath today in planning for TAVR.   Lauree Chandler 09/29/2018 7:24 AM

## 2018-09-29 NOTE — Interval H&P Note (Signed)
History and Physical Interval Note:  09/29/2018 7:24 AM  [name]  has presented today for cardiac cath with the diagnosis of severe aortic stenosis.  The various methods of treatment have been discussed with the patient and family. After consideration of risks, benefits and other options for treatment, the patient has consented to  Procedure(s): RIGHT/LEFT HEART CATH AND CORONARY ANGIOGRAPHY (N/A) as a surgical intervention.  The patient's history has been reviewed, patient examined, no change in status, stable for surgery.  I have reviewed the patient's chart and labs.  Questions were answered to the patient's satisfaction.    Cath Lab Visit (complete for each Cath Lab visit)  Clinical Evaluation Leading to the Procedure:   ACS: No.  Non-ACS:    Anginal Classification: CCS II  Anti-ischemic medical therapy: No Therapy  Non-Invasive Test Results: No non-invasive testing performed  Prior CABG: No previous CABG         Lauree Chandler

## 2018-10-24 ENCOUNTER — Emergency Department (HOSPITAL_COMMUNITY)
Admission: EM | Admit: 2018-10-24 | Discharge: 2018-10-24 | Disposition: A | Discharge status: Home / Self Care | Payer: Medicare HMO | Attending: Emergency Medicine | Admitting: Emergency Medicine

## 2018-10-24 ENCOUNTER — Telehealth: Payer: Self-pay | Admitting: Cardiovascular Disease

## 2018-10-24 ENCOUNTER — Emergency Department (HOSPITAL_COMMUNITY): Payer: Medicare HMO

## 2018-10-24 ENCOUNTER — Other Ambulatory Visit: Payer: Self-pay

## 2018-10-24 ENCOUNTER — Encounter (HOSPITAL_COMMUNITY): Payer: Self-pay | Admitting: Emergency Medicine

## 2018-10-24 DIAGNOSIS — R0602 Shortness of breath: Secondary | ICD-10-CM | POA: Diagnosis not present

## 2018-10-24 DIAGNOSIS — R0789 Other chest pain: Secondary | ICD-10-CM | POA: Diagnosis not present

## 2018-10-24 DIAGNOSIS — Z85828 Personal history of other malignant neoplasm of skin: Secondary | ICD-10-CM | POA: Insufficient documentation

## 2018-10-24 DIAGNOSIS — R001 Bradycardia, unspecified: Secondary | ICD-10-CM | POA: Diagnosis not present

## 2018-10-24 DIAGNOSIS — R079 Chest pain, unspecified: Secondary | ICD-10-CM

## 2018-10-24 LAB — CBC WITH DIFFERENTIAL/PLATELET
Abs Immature Granulocytes: 0.01 10*3/uL (ref 0.00–0.07)
Basophils Absolute: 0 10*3/uL (ref 0.0–0.1)
Basophils Relative: 0 %
Eosinophils Absolute: 0.1 10*3/uL (ref 0.0–0.5)
Eosinophils Relative: 1 %
HCT: 46.4 % (ref 39.0–52.0)
Hemoglobin: 16.2 g/dL (ref 13.0–17.0)
Immature Granulocytes: 0 %
Lymphocytes Relative: 35 %
Lymphs Abs: 2.3 10*3/uL (ref 0.7–4.0)
MCH: 33.5 pg (ref 26.0–34.0)
MCHC: 34.9 g/dL (ref 30.0–36.0)
MCV: 95.9 fL (ref 80.0–100.0)
Monocytes Absolute: 0.6 10*3/uL (ref 0.1–1.0)
Monocytes Relative: 9 %
Neutro Abs: 3.7 10*3/uL (ref 1.7–7.7)
Neutrophils Relative %: 55 %
Platelets: 188 10*3/uL (ref 150–400)
RBC: 4.84 MIL/uL (ref 4.22–5.81)
RDW: 11.9 % (ref 11.5–15.5)
WBC: 6.7 10*3/uL (ref 4.0–10.5)
nRBC: 0 % (ref 0.0–0.2)

## 2018-10-24 LAB — BASIC METABOLIC PANEL
Anion gap: 14 (ref 5–15)
BUN: 22 mg/dL (ref 8–23)
CO2: 20 mmol/L — ABNORMAL LOW (ref 22–32)
Calcium: 9.3 mg/dL (ref 8.9–10.3)
Chloride: 103 mmol/L (ref 98–111)
Creatinine, Ser: 1 mg/dL (ref 0.61–1.24)
GFR calc Af Amer: >60 mL/min (ref >60)
GFR calc non Af Amer: >60 mL/min (ref >60)
Glucose, Bld: 91 mg/dL (ref 70–99)
Potassium: 5.6 mmol/L — ABNORMAL HIGH (ref 3.5–5.1)
Sodium: 137 mmol/L (ref 135–145)

## 2018-10-24 LAB — TROPONIN I
Troponin I: <0.03 ng/mL (ref <0.03)
Troponin I: <0.03 ng/mL (ref <0.03)

## 2018-10-24 LAB — D-DIMER, QUANTITATIVE: D-Dimer, Quant: 0.39 ug/mL-FEU (ref 0.00–0.50)

## 2018-10-24 MED ORDER — ONDANSETRON HCL 4 MG/2ML IJ SOLN
4.0000 mg | Freq: Once | INTRAMUSCULAR | Status: AC
  Administered 2018-10-24: 4 mg via INTRAVENOUS
  Filled 2018-10-24: qty 2

## 2018-10-24 MED ORDER — SODIUM CHLORIDE 0.9 % IV BOLUS
500.0000 mL | Freq: Once | INTRAVENOUS | Status: AC
  Administered 2018-10-24: 500 mL via INTRAVENOUS

## 2018-10-24 NOTE — Telephone Encounter (Signed)
I spoke to the patient and his friend [name]) who said that they were to call Fraser Din if symptoms were getting worse for the patient, while he awaits TAVR.   Over the weekend, the patient's CP had gotten worse along with SOB and chills, but no fever according to [facility].  He is having CP with dizziness now so I advised the ED for further evaluation.  They verbalized understanding and wanted to inform Dr Angelena Form and Fraser Din.

## 2018-10-24 NOTE — ED Provider Notes (Signed)
[facility] EMERGENCY DEPARTMENT Provider Note   CSN: [id number] Arrival date & time: 10/24/18  0944    History   Chief Complaint Chief Complaint  Patient presents with  . Chest Pain    HPI Joshua Hess is a 78 y.o. male with a history of aortic stenosis, basal cell carcinoma, presenting to emergency department today with chief complaint of chest pain x1 week.  Patient states the pain is located on the right side of his chest and feels like a pressure.  The pain is intermittent.  He rates it 5 out of 10 in severity.  Patient also states he feels short of breath.  He has to take a deep breath in order to breathe, he states this is new for him.  He admits to shortness of breath at rest. Patient sees cardiologist Dr. Angelena Form.  Pt's medical records shows he had a recent cath on 09/29/18 as part of his pre evaluation for TVAR. The cath showed severe aortic stenosis.   TVAR for his aortic stenosis.  However 2 weeks ago he had a planned dental appointment for a broken tooth at [employer] but the appointment had to be cancelled due to covid-19.  He is very anxious about having the procedure done because he feels like his stenosis is only going to worsen and he is very concerned about it.  He denies any fever, cough, chills, nausea, vomiting, abdominal pain, palpitations, lower extremity edema. Also denies recent travel, long periods of immobilization.  History provided by pt.   Past Medical History:  Diagnosis Date  . Aortic stenosis   . Cancer ([place])    basal cell  . Carotid artery stenosis   . ED (erectile dysfunction)   . Elevated PSA     Patient Active Problem List   Diagnosis Date Noted  . Severe aortic stenosis   . Carotid stenosis, right 08/12/2018  . Sinus bradycardia 08/12/2018  . Aortic valve stenosis, nonrheumatic 08/12/2018  . Myalgia 11/01/2012  . Benign prostatic hypertrophy (BPH) with nocturia 11/01/2012    Past Surgical History:   Procedure Laterality Date  . EXTERNAL EAR SURGERY     melanoma removed  . HERNIA REPAIR    . RIGHT/LEFT HEART CATH AND CORONARY ANGIOGRAPHY N/A 09/29/2018   Procedure: RIGHT/LEFT HEART CATH AND CORONARY ANGIOGRAPHY;  Surgeon: Burnell Blanks, MD;  Location: [facility];  Service: Cardiovascular;  Laterality: N/A;        Home Medications    Prior to Admission medications   Medication Sig Start Date End Date Taking? Authorizing Provider  Polyethyl Glycol-Propyl Glycol (LUBRICANT EYE DROPS) 0.4-0.3 % SOLN Place 1-2 drops into both eyes 3 (three) times daily as needed (dry/irritated eyes.).    Yes [provider]  sildenafil (VIAGRA) 25 MG tablet Take 25 mg by mouth daily as needed for erectile dysfunction.    Yes [provider]  traZODone (DESYREL) 50 MG tablet Take 50 mg by mouth at bedtime as needed (sleep).    Yes [provider]    Family History Family History  Problem Relation Age of Onset  . Heart disease Brother     Social History Social History   Tobacco Use  . Smoking status: Never Smoker  . Smokeless tobacco: Never Used  Substance Use Topics  . Alcohol use: No  . Drug use: No     Allergies   Patient has no known allergies.   Review of Systems Review of Systems  Constitutional:  Negative for chills and fever.  HENT: Negative for congestion, rhinorrhea, sinus pressure and sore throat.   Eyes: Negative for pain and redness.  Respiratory: Positive for shortness of breath. Negative for cough and wheezing.   Cardiovascular: Positive for chest pain. Negative for palpitations.  Gastrointestinal: Negative for abdominal pain, constipation, diarrhea, nausea and vomiting.  Genitourinary: Negative for dysuria.  Musculoskeletal: Negative for arthralgias, back pain, myalgias and neck pain.  Skin: Negative for rash and wound.  Neurological: Negative for dizziness, syncope, weakness, numbness and headaches.   Psychiatric/Behavioral: Negative for confusion.     Physical Exam Updated Vital Signs BP (!) 163/92   Pulse (!) 59   Temp 97.6 F (36.4 C) (Oral)   Resp 16   Ht 5\' 9"  (1.753 m)   Wt 77.1 kg   SpO2 100%   BMI 25.10 kg/m   Physical Exam Vitals signs and nursing note reviewed.  Constitutional:      Appearance: He is well-developed. He is not toxic-appearing.  HENT:     Head: Normocephalic and atraumatic.     Mouth/Throat:      Comments: Gumline palpated no obvious signs of infection including no warmth, redness, abscess, tenderness. Posterior oropharynx clear with no signs of infection, uvula is midline and rises with phonation. External exam shows no asymmetry of the jaw line or face, no signs of obvious swelling or infection. No swelling or tenderness to the submental or submandibular regions.   Eyes:     General: No scleral icterus.       Right eye: No discharge.        Left eye: No discharge.     Conjunctiva/sclera: Conjunctivae normal.  Neck:     Musculoskeletal: Normal range of motion.     Vascular: No JVD.  Cardiovascular:     Rate and Rhythm: Regular rhythm. Bradycardia present.     Pulses: Normal pulses.          Radial pulses are 2+ on the right side and 2+ on the left side.     Heart sounds: Murmur present.  Pulmonary:     Effort: Pulmonary effort is normal.     Breath sounds: Normal breath sounds. No wheezing, rhonchi or rales.  Chest:     Chest wall: No tenderness.  Abdominal:     General: There is no distension.     Tenderness: There is no abdominal tenderness. There is no guarding or rebound.  Musculoskeletal: Normal range of motion.     Comments: Negative Homans sign bilaterally. No lower extremity edema, no calf tenderness to palpation bilaterally.   Skin:    General: Skin is warm and dry.  Neurological:     Mental Status: He is oriented to person, place, and time.     Comments: Fluent speech, no facial droop.  Psychiatric:        Mood and  Affect: Mood is anxious.        Behavior: Behavior normal.      ED Treatments / Results  Labs (all labs ordered are listed, but only abnormal results are displayed) Labs Reviewed  BASIC METABOLIC PANEL - Abnormal; Notable for the following components:      Result Value   Potassium 5.6 (*)    CO2 20 (*)    All other components within normal limits  CBC WITH DIFFERENTIAL/PLATELET  TROPONIN I  D-DIMER, QUANTITATIVE (NOT AT [facility])  TROPONIN I    EKG None  Radiology Dg Chest Portable 1 View  Result Date:  10/24/2018 CLINICAL DATA:  Right-sided chest tightness. EXAM: PORTABLE CHEST 1 VIEW COMPARISON:  Two-view chest x-ray 11/04/2015 FINDINGS: Heart is mildly enlarged. There is some prominence of the interstitium, unchanged. There is no edema or effusion to suggest failure. No focal airspace disease is present. Visualized soft tissues and bony thorax are unremarkable. IMPRESSION: 1. Cardiomegaly without failure. 2. No acute cardiopulmonary disease. Electronically Signed   By: San Morelle M.D.   On: 10/24/2018 10:31    Procedures Procedures (including critical care time)  Medications Ordered in ED Medications  ondansetron (ZOFRAN) injection 4 mg (4 mg Intravenous Given 10/24/18 1217)  sodium chloride 0.9 % bolus 500 mL (0 mLs Intravenous Stopped 10/24/18 1329)     Initial Impression / Assessment and Plan / ED Course  I have reviewed the triage vital signs and the nursing notes.  Pertinent labs & imaging results that were available during my care of the patient were reviewed by me and considered in my medical decision making (see chart for details).  Pt is 78 yo male presenting with chest tightness and shortness of breath x 1 week. On my initial exam pt is anxious. He is not currently having any pain or shortness of breath. He admits to feeling very anxious because he has not yet scheduled his TVAR procedure and wants to have it performed as soon as possible.    Patient  presents to the emergency department with chest pain. Patient nontoxic appearing, in no apparent distress, vitals without significant abnormality. Fairly benign physical exam. DDX: anxiety, ACS, pulmonary embolism, dissection, pneumothorax, effusion, infiltrate, arrhythmia, anemia, electrolyte derangement, MSK. Evaluation initiated with labs, EKG, and CXR. Patient on cardiac monitor.   Work-up in the ER unremarkable. Labs reviewed, no leukocytosis, anemia. Potassium is elevated at 5.6, with hemolysis noted in sample. CXR without infiltrate, effusion, pneumothorax, or fracture/dislocation.   Pt denies dental pain associated with dental cary but On exam pt has no signs or symptoms of dental abscess given he is afebrile, non toxic appearing and swallowing secretions well. I stressed the importance of dental follow up and pt states he has an appointment schedule to see an oral surgeon soon, but cannot remember exact date.  EKG without obvious ischemia, delta troponin negative, doubt ACS. Patient is low risk wells, D dimer negative,  pulmonary embolism unlikely cause of shortness of breath. Pain is not a tearing sensation, symmetric pulses, no widening of mediastinum on CXR, doubt dissection.  Patient has appeared hemodynamically stable throughout ER visit and appears safe for discharge with close PCP/cardiology follow up. Pt has appointment scheduled with cardiology on 10/27/18, I recommended he keep that appointment as scheduled.  I discussed results, treatment plan, need for PCP/cardiology follow-up, and return precautions with the patient. Pt admits to his anxiety being higher than usual, but after hearing negative workup today he feels better about everything. Pt's heart rate is consistently ranged from 52-56 while here today. 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 Looking through EMR pt has documentation of HR in the 50s. Provided opportunity for questions, patient confirmed  understanding and is in agreement with plan. Case has been discussed with Dr. Lita Mains who agrees with the above plan to discharge.    This note was prepared with assistance of Systems analyst. Occasional wrong-word or sound-a-like substitutions may have occurred due to the inherent limitations of voice recognition software.  Final Clinical Impressions(s) / ED Diagnoses   Final diagnoses:  Nonspecific chest  pain    ED Discharge Orders    None       Flint Melter 10/24/18 2131    Julianne Rice, MD 10/25/18 [phone]

## 2018-10-24 NOTE — Telephone Encounter (Signed)
Note.  Thanks !

## 2018-10-24 NOTE — Telephone Encounter (Signed)
New Message    Pt is calling and would like for Pat to call him  Please call back

## 2018-10-24 NOTE — Telephone Encounter (Signed)
° °  Patient spouse [name] calling, confirmed they are going to the ED now, however would like an appointment with Dr Angelena Form this week as well. Would patient be appropriate for virtual visit? Please advise

## 2018-10-24 NOTE — Telephone Encounter (Signed)
  [facility]   MULTIDISCIPLINARY HEART VALVE TEAM   I spoke with Joshua Hess and Joshua Hess by phone.  Joshua Hess is currently driving the pt to the [facility] ER for evaluation of chest discomfort, difficulty breathing and dizziness.  The pt states that he has an "unusual feeling in his chest" and "I am having to take deep breaths." He feels like he is not getting enough air and has a tightness in his chest.  I advised to continue with plan for evaluation at this time in the ER.    FYI the pt was undergoing dental evaluation at [employer] but this has been postponed due to Covid-19 restrictions.  The pt has a broken tooth and was referred to an oral surgeon.   I will notify Dr Angelena Form and Dr Sallyanne Kuster that the pt is coming into the ER for evaluation.

## 2018-10-24 NOTE — Discharge Instructions (Signed)
Read instructions below for reasons to return to the Emergency Department. It is recommended that your follow up with your Primary Care Doctor  and cardiologist in regards to today's visit. If you do not have a doctor, use the resource guide to help you find one.   Tests performed today include: An EKG of your heart A chest x-ray Cardiac enzymes - a blood test for heart muscle damage Blood counts and electrolytes  Chest Pain (Nonspecific)  HOME CARE INSTRUCTIONS  -For the next few days, avoid physical activities that bring on chest pain. Continue physical activities as directed.  -Follow your caregiver's suggestions for further testing if your chest pain does not go away.  -Keep any follow-up appointments you made. If you do not go to an appointment, you could develop lasting (chronic) problems with pain. If there is any problem keeping an appointment, you must call to reschedule.   SEEK MEDICAL CARE IF:  You think you are having problems from the medicine you are taking. Read your medicine instructions carefully.  Your chest pain does not go away, even after treatment.  You develop a rash with blisters on your chest.   SEEK IMMEDIATE MEDICAL CARE IF:  You have increased chest pain or pain that spreads to your arm, neck, jaw, back, or belly (abdomen).  You develop shortness of breath, an increasing cough, or you are coughing up blood.  You have severe back or abdominal pain, feel sick to your stomach (nauseous) or throw up (vomit).  You develop severe weakness, fainting, or chills.  You have an oral temperature above 102 F (38.9 C), not controlled by medicine.   THIS IS AN EMERGENCY. Do not wait to see if the pain will go away. Get medical help at once. Call 911. Do not drive yourself to the hospital.

## 2018-10-24 NOTE — ED Notes (Signed)
Pa spoke with Pt prior to discharge. IV was removed and Pt verbalized understanding of treatment and following instructions. Pt chose to ambulate to waiting room

## 2018-10-24 NOTE — ED Triage Notes (Signed)
Pt arrives POV with complaints of chest tightness X1 week with feeling jittery. Pt states yesterday he really felt worse and his chest pain got worse, endorsing SOB, nausea and sweating

## 2018-10-26 ENCOUNTER — Telehealth: Payer: Self-pay

## 2018-10-26 NOTE — Telephone Encounter (Signed)
Follow up  ° ° °Patient is returning call.  °

## 2018-10-26 NOTE — Telephone Encounter (Signed)
New Message ° ° °Patient returning your call. °

## 2018-10-26 NOTE — Telephone Encounter (Signed)
°  4/15 :   Left VM on both Mobile and Home # listed to return call. Call is regarding appointment on 4/16.

## 2018-10-27 ENCOUNTER — Other Ambulatory Visit: Payer: Self-pay

## 2018-10-27 ENCOUNTER — Telehealth (INDEPENDENT_AMBULATORY_CARE_PROVIDER_SITE_OTHER): Payer: Medicare HMO | Admitting: Cardiovascular Disease

## 2018-10-27 ENCOUNTER — Encounter: Payer: Self-pay | Admitting: Cardiovascular Disease

## 2018-10-27 VITALS — BP 153/64 | HR 50 | Ht 69.0 in | Wt 168.0 lb

## 2018-10-27 DIAGNOSIS — I350 Nonrheumatic aortic (valve) stenosis: Secondary | ICD-10-CM

## 2018-10-27 NOTE — Progress Notes (Signed)
Virtual Visit via Video Note   This visit type was conducted due to national recommendations for restrictions regarding the COVID-19 Pandemic (e.g. social distancing) in an effort to limit this patient's exposure and mitigate transmission in our community.  Due to his co-morbid illnesses, this patient is at least at moderate risk for complications without adequate follow up.  This format is felt to be most appropriate for this patient at this time.  All issues noted in this document were discussed and addressed.  A limited physical exam was performed with this format.  Please refer to the patient's chart for his consent to telehealth for [facility].   Evaluation Performed:  Follow-up visit  Date:  10/27/2018   ID:  Joshua Hess, DOB [birth year], MRN [id number]  Patient Location: Home Provider Location: Home  PCP:  [name], MD  Cardiologist:  Sanda Klein, MD  TAVR: [facility] Electrophysiologist:  None   Chief Complaint:  Dizziness, chest pain, severe AS  History of Present Illness:    Joshua Hess is a 78 y.o. male with history of severe aortic stenosis who I am seeing today by a virtual e-visit to discuss his aortic stenosis. I saw him as a new consult 08/22/18 for further discussion of his aortic stenosis and discussion regarding possible TAVR. He is followed in our office by Dr. Sallyanne Kuster. He told me at the first visit that he has been very healthy his entire life. He has never been hospitalized. He is a runner and is still running daily. He has begun to notice more fatigue with extreme exercise. No dyspnea on exertion or chest pain. No dizziness, near syncope or syncope at that time. He was seen by Dr. Sallyanne Kuster October 2019 and was found to have severe aortic stenosis. Echo July 2019 in an outside office showed normal LV systolic function with QPYP=95-09% and severe AS with mean gradient 25.5 mmHg with AVA of 0.8 cm2. Most recent echo in our office January 2020 with  LVEF=60-65%, mild LVH. The aortic valve leaflets are thickened with limited leaflet mobility. Mean gradient 46 mmHg, peak gradient 81 mmHg, AVA 0.86 cm2, dimensionless index 0.28. Carotid artery dopplers July 2019 with 50-70% proximal left ICA stenosis. He wished to pursue workup for TAVR so I arranged a cardiac catheterization on 09/29/18 which showed mild non-obstructive CAD. Mean gradient 17 mmHg by cath but as above 46 mmHg by echo. As noted previously, he has been running 30-40 miles per week up until the last month.  He has run 150 marathons in his life. He is retired from [employer].   He was seen in the ED earlier this week with c/o dyspnea and chest pain. EKG did not show ischemic changes. He was discharged home. He tells me today that he is noticing more profound dyspnea with exertion. He is having dizziness but no syncope. Constant chest pressure worsened with exertion. No LE edema. He is very anxious about his heart issues.   The patient does not have symptoms concerning for COVID-19 infection (fever, chills, cough, or new shortness of breath).    Past Medical History:  Diagnosis Date   Aortic stenosis    Cancer (HCC)    basal cell   Carotid artery stenosis    ED (erectile dysfunction)    Elevated PSA    Past Surgical History:  Procedure Laterality Date   EXTERNAL EAR SURGERY     melanoma removed   HERNIA REPAIR     RIGHT/LEFT HEART  CATH AND CORONARY ANGIOGRAPHY N/A 09/29/2018   Procedure: RIGHT/LEFT HEART CATH AND CORONARY ANGIOGRAPHY;  Surgeon: Burnell Blanks, MD;  Location: [facility];  Service: Cardiovascular;  Laterality: N/A;     Current Meds  Medication Sig   Polyethyl Glycol-Propyl Glycol (LUBRICANT EYE DROPS) 0.4-0.3 % SOLN Place 1-2 drops into both eyes 3 (three) times daily as needed (dry/irritated eyes.).    sildenafil (VIAGRA) 25 MG tablet Take 25 mg by mouth daily as needed for erectile dysfunction.    traZODone (DESYREL) 50 MG  tablet Take 50 mg by mouth at bedtime as needed (sleep).      Allergies:   Patient has no known allergies.   Social History   Tobacco Use   Smoking status: Never Smoker   Smokeless tobacco: Never Used  Substance Use Topics   Alcohol use: No   Drug use: No     Family Hx: The patient's family history includes Heart disease in his brother.  ROS:   Please see the history of present illness.    All other systems reviewed and are negative.   Prior CV studies:   The following studies were reviewed today:  Echo January 2020:  1. The left ventricle appears to be mildly increased in size, have normal wall thickness, with 60-65%. Echo evidence of impaired relaxation in diastolic filling patterns.  2. Right ventricular systolic pressure is is moderately elevated.  3. The right ventricle is normal in size, has normal wall thickness and normal systolic function.  4. Normal left atrial size.  5. Normal right atrial size.  6. The mitral valve is degenerative.  7. Normal tricuspid valve.  8. Aortic valve regurgitation is moderate by color flow Doppler.  9. Aortic valve tricuspid. 10. Severe stenosis of the aortic valve. 11. There is moderate sclerosis of the aortic valve. 12. There is severe thickening of the aortic valve. 13. No atrial level shunt detected by color flow Doppler.  FINDINGS  Left Ventricle: The left ventricle appears to be mildly increased in size, have normal wall thickness, with normal systolic function with a 56-21%. Echo evidence of impaired relaxation in diastolic filling patterns. Right Ventricle: The right ventricle is normal in size, has normal wall thickness and normal systolic function. Right ventricular systolic pressure is is moderately elevated with an estimated pressure of 42.3 mmHg. Left Atrium: The left atrium is normal in size. Right Atrium: The right atrial size is normal in size. Interatrial Septum: No atrial level shunt detected by color flow  Doppler.  Pericardium: There is no evidence of pericardial effusion. Mitral Valve: The mitral valve is degenerative in appearance. There is mild thickening of the mitral valve. Tricuspid Valve: The tricuspid valve is normal in structure. Tricuspid regurgitation was not visualized by color flow Doppler. The tricuspid regurgitant velocity is 2.93 m/s, and with an estimated right atrial pressure of 8 mmHg, the right ventricular  systolic pressure is mildly elevated at 42.3 mmHg. Aortic Valve: The aortic valve tricuspid. There is severe thickening of the aortic valve with moderately decreased cusp excursion. There is moderate sclerosis of the aortic valve with moderately decreased cusp excursion. The calculated aortic valve area  is 0.87 cm, consistent with severe stenosis. Aortic valve regurgitation is moderate by color flow Doppler with a pressure half time of 592 msec. Pulmonic Valve: The pulmonic valve is normal. Pulmonic valve regurgitation is not visualized by color flow Doppler. Venous: The inferior vena cava was normal in size with greater than 50% respiratory variablity.  LEFT VENTRICLE PLAX 2D (Teich) LV EF:          54.4 %   Diastology LVIDd:          5.60 cm  LV e' lateral:   0.1 m/s LVIDs:          4.00 cm  LV E/e' lateral: 12.0 LV PW:          0.90 cm  LV e' medial:    0.0 m/s LV IVS:         0.80 cm  LV E/e' medial:  14.2 LVOT diam:      2.00 cm LV SV:          84 ml LVOT Area:      3.14 cm  RIGHT VENTRICLE RV S prime:     0.14 m/s TAPSE (M-mode): 2.8 cm RVSP:           42.3 mmHg  LEFT ATRIUM              Index       RIGHT ATRIUM LA diam:        4.20 cm  2.16 cm/m  RA Pressure: 8 mmHg LA Vol (A2C):   109.0 ml 56.11 ml/m LA Vol (A4C):   77.6 ml  39.95 ml/m LA Biplane Vol: 97.1 ml  49.99 ml/m  AORTIC VALVE AV Area (Vmax):    0.99 cm AV Area (Vmean):   0.86 cm AV Area (VTI):     0.87 cm AV Vmax:           4.52 m/s AV Vmean:          3.175 m/s AV VTI:             1.108 m AV Peak Grad:      81.9 mmHg AV Mean Grad:      46.0 mmHg LVOT Vmax:         1.42 m/s LVOT Vmean:        0.874 m/s LVOT VTI:          0.307 m LVOT/AV VTI ratio: 0.28 AR PHT:            592 msec   AORTA Ao Root diam: 3.60 cm Ao Asc diam:  3.40 cm  MITRAL VALVE               TR Peak grad: 34.3 mmHg MV Area (PHT): 2.20 cm    TR Vmax:      2.93 m/s MV PHT:        100.05 msec RAP:          8 mmHg MV Decel Time: 345 msec    RVSP:         42.3 mmHg MV E velocity: 0.62 m/s MV A velocity: 0.65 m/s MV E/A ratio:  0.95  Cardiac cath 09/29/18:  Prox RCA to Mid RCA lesion is 20% stenosed.  Mid RCA lesion is 30% stenosed.  Ost Cx to Prox Cx lesion is 30% stenosed.  Prox LAD lesion is 10% stenosed.  Mid LAD lesion is 20% stenosed.  Mid LAD to Dist LAD lesion is 20% stenosed.   1. Mild non-obstructive CAD 2. Severe aortic stenosis by echo (by cath mean gradient 17. 73mmHg and peak to peak gradient 24 mmHg    Diagnostic  Dominance: Right    Fick Cardiac Output 5.53 L/min  Fick Cardiac Output Index 2.87 (L/min)/BSA  Aortic Mean Gradient 17.07 mmHg  Aortic Peak Gradient 24 mmHg  Aortic  Valve Area 1.73  Aortic Value Area Index 0.9 cm2/BSA  RA A Wave 9 mmHg  RA V Wave 5 mmHg  RA Mean 4 mmHg  RV Systolic Pressure 30 mmHg  RV Diastolic Pressure 2 mmHg  RV EDP 8 mmHg  PA Systolic Pressure 27 mmHg  PA Diastolic Pressure 8 mmHg  PA Mean 14 mmHg  PW A Wave 14 mmHg  PW V Wave 13 mmHg  PW Mean 10 mmHg  AO Systolic Pressure 213 mmHg  AO Diastolic Pressure 51 mmHg  AO Mean 70 mmHg  LV Systolic Pressure 086 mmHg  LV Diastolic Pressure 8 mmHg  LV EDP 15 mmHg  AOp Systolic Pressure 578 mmHg  AOp Diastolic Pressure 58 mmHg  AOp Mean Pressure 83 mmHg  LVp Systolic Pressure 469 mmHg  LVp Diastolic Pressure 6 mmHg  LVp EDP Pressure 17 mmHg  QP/QS 1  TPVR Index 4.88 HRUI  TSVR Index 24.39 HRUI  PVR SVR Ratio 0.06  TPVR/TSVR Ratio 0.2    Labs/Other Tests and Data  Reviewed:    EKG:  No ECG reviewed.  Recent Labs: 10/24/2018: BUN 22; Creatinine, Ser 1.00; Hemoglobin 16.2; Platelets 188; Potassium 5.6; Sodium 137   Recent Lipid Panel No results found for: CHOL, TRIG, HDL, CHOLHDL, LDLCALC, LDLDIRECT  Wt Readings from Last 3 Encounters:  10/27/18 168 lb (76.2 kg)  10/24/18 170 lb (77.1 kg)  09/29/18 170 lb (77.1 kg)     Objective:    Vital Signs:  BP (!) 153/64    Pulse (!) 50    Ht 5\' 9"  (1.753 m)    Wt 168 lb (76.2 kg)    BMI 24.81 kg/m    Well nourished, well developed male in noacute distress.  No other examination due to virtual visit  ASSESSMENT & PLAN:    1. Severe aortic stenosis: He has severe, stage D aortic valve stenosis. I have personally reviewed the echo images. The aortic valve is thickened, calcified with limited leaflet mobility. I think he would benefit from AVR. He is felt to be a good candidate for TAVR. He has mild CAD. We have delayed further workup due to the ongoing Covid 19 pandemic. He is now having worrisome symptoms. Will proceed with planning for TAVR. He is aware of Covid 19 restrictions that are in place at [facility] and agrees to proceed with the workup.   STS Risk Score:  Procedure: Isolated AVR  Risk of Mortality: 0.835%  Renal Failure: 0.590%  Permanent Stroke: 1.060%  Prolonged Ventilation: 2.892%  DSW Infection: 0.069%  Reoperation: 3.748%  Morbidity or Mortality: 6.060%  Short Length of Stay: 56.720%  Long Length of Stay: 2.137%    I have reviewed the natural history of aortic stenosis with the patient and their family members  who are present today. We have discussed the limitations of medical therapy and the poor prognosis associated with symptomatic aortic stenosis. We have reviewed potential treatment options, including palliative medical therapy, conventional surgical aortic valve replacement, and transcatheter aortic valve replacement. We discussed treatment options in the context of the patient's  specific comorbid medical conditions.   Risks and benefits of the TAVR procedure reviewed with the patient. Will arrange the gated cardiac CT, CTA of the chest/abdomen and pelvis, PFTs, carotid dopplers. Will then refer him to see one of the CT surgeons on our TAVR team.    COVID-19 Education: The signs and symptoms of COVID-19 were discussed with the patient and how to seek care for testing (follow up  with PCP or arrange E-visit).  The importance of social distancing was discussed today.  Time:   Today, I have spent 30 minutes with the patient with telehealth technology discussing the above problems.     Medication Adjustments/Labs and Tests Ordered: Current medicines are reviewed at length with the patient today.  Concerns regarding medicines are outlined above.   Tests Ordered: No orders of the defined types were placed in this encounter.   Medication Changes: No orders of the defined types were placed in this encounter.   Disposition:  Follow up with the valve team.   Signed, Lauree Chandler, MD  10/27/2018 12:28 PM    [place]

## 2018-10-27 NOTE — Telephone Encounter (Signed)
°  NEW MESSAGE  4/16 :  Video/Doximity Visit for 4/16 appointment  MyChart activated. Consent confirmed via MyChart  - 10/27/2018

## 2018-10-27 NOTE — Patient Instructions (Signed)
Medication Instructions:  No changes today If you need a refill on your cardiac medications before your next appointment, please call your pharmacy.   Lab work: None today  Testing/Procedures: Our Structural Heart Navigation Nurse, Lauren, RN will be in contact with you to further discuss testing procedures.  Follow-Up TO BE ARRANGED.    Any Other Special Instructions Will Be Listed Below (If Applicable).

## 2018-10-27 NOTE — Progress Notes (Signed)
Thanks, Chris 

## 2018-11-01 ENCOUNTER — Ambulatory Visit (HOSPITAL_COMMUNITY): Payer: Medicare HMO

## 2018-11-01 ENCOUNTER — Ambulatory Visit (HOSPITAL_COMMUNITY)
Admission: RE | Admit: 2018-11-01 | Discharge: 2018-11-01 | Disposition: A | Discharge status: Home / Self Care | Payer: Medicare HMO | Source: Ambulatory Visit | Attending: Cardiovascular Disease | Admitting: Cardiovascular Disease

## 2018-11-01 ENCOUNTER — Encounter (HOSPITAL_COMMUNITY): Payer: Self-pay

## 2018-11-01 ENCOUNTER — Other Ambulatory Visit: Payer: Self-pay

## 2018-11-01 DIAGNOSIS — I350 Nonrheumatic aortic (valve) stenosis: Secondary | ICD-10-CM | POA: Insufficient documentation

## 2018-11-01 MED ORDER — IOHEXOL 350 MG/ML SOLN
100.0000 mL | Freq: Once | INTRAVENOUS | Status: AC | PRN
  Administered 2018-11-01: 100 mL via INTRAVENOUS

## 2018-11-01 NOTE — Progress Notes (Signed)
Pt tolerated exam without incident.  PIV removed and dressing applied.  Discharge instructions discussed and written instructions provided to patient.  Pt discharged

## 2018-11-01 NOTE — Discharge Instructions (Signed)
Cardiac CT Angiogram  A cardiac CT angiogram is a procedure to look at the heart and the area around the heart. It may be done to help find the cause of chest pains or other symptoms of heart disease. During this procedure, a large X-ray machine, called a CT scanner, takes detailed pictures of the heart and the surrounding area after a dye (contrast material) has been injected into blood vessels in the area. The procedure is also sometimes called a coronary CT angiogram, coronary artery scanning, or CTA. A cardiac CT angiogram allows the health care provider to see how well blood is flowing to and from the heart. The health care provider will be able to see if there are any problems, such as:  Blockage or narrowing of the coronary arteries in the heart.  Fluid around the heart.  Signs of weakness or disease in the muscles, valves, and tissues of the heart. Tell a health care provider about:  Any allergies you have. This is especially important if you have had a previous allergic reaction to contrast dye.  All medicines you are taking, including vitamins, herbs, eye drops, creams, and over-the-counter medicines.  Any blood disorders you have.  Any surgeries you have had.  Any medical conditions you have.  Whether you are pregnant or may be pregnant.  Any anxiety disorders, chronic pain, or other conditions you have that may increase your stress or prevent you from lying still. What are the risks? Generally, this is a safe procedure. However, problems may occur, including:  Bleeding.  Infection.  Allergic reactions to medicines or dyes.  Damage to other structures or organs.  Kidney damage from the dye or contrast that is used.  Increased risk of cancer from radiation exposure. This risk is low. Talk with your health care provider about: ? The risks and benefits of testing. ? How you can receive the lowest dose of radiation. What happens before the procedure?  Wear  comfortable clothing and remove any jewelry, glasses, dentures, and hearing aids.  Follow instructions from your health care provider about eating and drinking. This may include: ? For 12 hours before the test -- avoid caffeine. This includes tea, coffee, soda, energy drinks, and diet pills. Drink plenty of water or other fluids that do not have caffeine in them. Being well-hydrated can prevent complications. ? For 4-6 hours before the test -- stop eating and drinking. The contrast dye can cause nausea, but this is less likely if your stomach is empty.  Ask your health care provider about changing or stopping your regular medicines. This is especially important if you are taking diabetes medicines, blood thinners, or medicines to treat erectile dysfunction. What happens during the procedure?  Hair on your chest may need to be removed so that small sticky patches called electrodes can be placed on your chest. These will transmit information that helps to monitor your heart during the test.  An IV tube will be inserted into one of your veins.  You might be given a medicine to control your heart rate during the test. This will help to ensure that good images are obtained.  You will be asked to lie on an exam table. This table will slide in and out of the CT machine during the procedure.  Contrast dye will be injected into the IV tube. You might feel warm, or you may get a metallic taste in your mouth.  You will be given a medicine (nitroglycerin) to relax (dilate) the arteries  in your heart.  The table that you are lying on will move into the CT machine tunnel for the scan.  The person running the machine will give you instructions while the scans are being done. You may be asked to: ? Keep your arms above your head. ? Hold your breath. ? Stay very still, even if the table is moving.  When the scanning is complete, you will be moved out of the machine.  The IV tube will be removed. The  procedure may vary among health care providers and hospitals. What happens after the procedure?  You might feel warm, or you may get a metallic taste in your mouth from the contrast dye.  You may have a headache from the nitroglycerin.  After the procedure, drink water or other fluids to wash (flush) the contrast material out of your body.  Contact a health care provider if you have any symptoms of allergy to the contrast. These symptoms include: ? Shortness of breath. ? Rash or hives. ? A racing heartbeat.  Most people can return to their normal activities right after the procedure. Ask your health care provider what activities are safe for you.  It is up to you to get the results of your procedure. Ask your health care provider, or the department that is doing the procedure, when your results will be ready. Summary  A cardiac CT angiogram is a procedure to look at the heart and the area around the heart. It may be done to help find the cause of chest pains or other symptoms of heart disease.  During this procedure, a large X-ray machine, called a CT scanner, takes detailed pictures of the heart and the surrounding area after a dye (contrast material) has been injected into blood vessels in the area.  Ask your health care provider about changing or stopping your regular medicines before the procedure. This is especially important if you are taking diabetes medicines, blood thinners, or medicines to treat erectile dysfunction.  After the procedure, drink water or other fluids to wash (flush) the contrast material out of your body. This information is not intended to replace advice given to you by your health care provider. Make sure you discuss any questions you have with your health care provider. Document Released: 06/11/2008 Document Revised: 05/18/2016 Document Reviewed: 05/18/2016 Elsevier Interactive Patient Education  2019 [place] Need to Know About IV Contrast  Material IV contrast material is most often a fluid that is used with some imaging tests. Contrast material is injected into your body through a vein to help your health care providers see your organs and tissues more clearly. It may be used with:  X-ray.  MRI.  CT.  Ultrasound. Contrast material is used when your health care providers need a detailed look at organs, tissues, or blood vessels that may not show up with the standard test. IV contrast may be used for imaging tests that examine:  Muscles, skin, and fat.  Breasts.  Brain.  Digestive tract.  Heart.  Liver.  Lungs and many other internal organs. What are the risks of using IV contrast material? The risks of using IV contrast material include:  Headache.  Itching, skin rash, and hives.  Allergic reactions.  Nausea and vomiting.  Wheezing or difficulty breathing.  Abnormal heart rate.  Blood pressure changes.  Throat swelling.  Kidney damage. These complications are more likely to occur in people who:  Have kidney failure.  Have liver problems.  Have certain heart problems, including: ? Heart failure. ? Heart attack. ? Heart infection. ? Heart valve problems.  Abuse alcohol.  Have allergies or asthma.  Are dehydrated.  Have sickle cell anemia or similar problems.  Have had trouble with IV contrast material in the past.  Take certain medicines, such as: ? Metformin. ? NSAIDs. ? Beta blockers. ? Interleukin-2. How do I prepare for my test with IV contrast material?  Follow instructions from your health care provider about eating or drinking restrictions.  Ask your health care provider about changing or stopping your regular medicines. This is especially important if you are taking diabetes medicines or blood thinners.  Tell your health care provider about: ? Any previous illnesses, surgeries, or pre-existing medical conditions. ? Whether you are pregnant or may be  pregnant. ? Whether you are breastfeeding. Most contrast agents are safe for use in breastfeeding women.  You may have a physical exam to determine any potential risks.  Ask if you will be given a medicine (sedative) to help you relax during the procedure. If so, plan to have someone take you home after test. What happens during the test with IV contrast material?   You may be given a sedative to help you relax.  A needle will be inserted into one of your veins to administer the IV contrast material.  You may feel warmth or flushing as the material enters your bloodstream.  You may have a metallic taste in your mouth for a few minutes.  The needle may cause some discomfort and bruising.  After the contrast material is in your body, the imaging test will be done. The procedure may vary among health care providers and hospitals. What happens after the test with IV contrast material?  You may be asked to drink water or other fluids to wash (flush) the contrast material out of your body.  Drink enough fluid to keep your urine pale yellow.  Do not drive for 24 hours if you received a sedative.  It is your responsibility to get your test results. Ask your health care provider or the department performing the test when your results will be ready. Contact a health care provider if:  You have redness, swelling, or pain near your IV site. Get help right away if:  You have an abnormal heart rhythm.  You have trouble breathing.  You have: ? Chest pain. ? Pain in your back, neck, arm, jaw, or stomach. ? Nausea or sweating. ? Hives or a rash.  You start shaking and cannot stop. These symptoms may represent a serious problem that is an emergency. Do not wait to see if the symptoms will go away. Get medical help right away. Call your local emergency services (911 in the [place] Do not drive yourself to the hospital. Summary  IV contrast may be used for imaging tests to help your  health care providers see your organs and tissues more clearly.  Tell your health care provider if you are pregnant or may be pregnant.  During the procedure, you may feel warmth or flushing as the material enters your bloodstream.  After the procedure, drink enough fluid to keep your urine pale yellow. This information is not intended to replace advice given to you by your health care provider. Make sure you discuss any questions you have with your health care provider. Document Released: 06/17/2009 Document Revised: 02/21/2018 Document Reviewed: 03/06/2015 Elsevier Interactive Patient Education  2019 [employer].

## 2018-11-02 ENCOUNTER — Encounter: Payer: Self-pay | Admitting: Surgery

## 2018-11-02 ENCOUNTER — Other Ambulatory Visit: Payer: Self-pay

## 2018-11-02 ENCOUNTER — Encounter: Payer: Self-pay | Admitting: Physical Therapy

## 2018-11-02 ENCOUNTER — Institutional Professional Consult (permissible substitution): Payer: Medicare HMO | Admitting: Surgery

## 2018-11-02 ENCOUNTER — Ambulatory Visit: Payer: Medicare HMO | Attending: Cardiovascular Disease | Admitting: Physical Therapy

## 2018-11-02 VITALS — BP 140/66 | HR 51 | Temp 97.6°F | Resp 20 | Ht 69.0 in | Wt 168.0 lb

## 2018-11-02 DIAGNOSIS — I350 Nonrheumatic aortic (valve) stenosis: Secondary | ICD-10-CM

## 2018-11-02 DIAGNOSIS — R262 Difficulty in walking, not elsewhere classified: Secondary | ICD-10-CM | POA: Diagnosis not present

## 2018-11-02 NOTE — Progress Notes (Signed)
Thank you Joshua Hess 

## 2018-11-02 NOTE — Progress Notes (Signed)
[facility]  MULTIDISCIPLINARY HEART VALVE CLINIC  CARDIOTHORACIC SURGERY CONSULTATION REPORT  Referring Provider is Croitoru, Dani Gobble, MD Primary Cardiologist is Sanda Klein, MD PCP is [name], MD  Chief Complaint  Patient presents with   Aortic Stenosis    Surgical eval for TAVR, review all studies    HPI:  The patient is a 78 year old gentleman who is been extremely active and physically fit for his entire adult life as an avid runner and Production designer, theatre/television/film.  For the past 1-1/2 years he is developed progressive exertional fatigue and has noticed that he has not been able to run as far due to fatigue.  He has not noted any shortness of breath or chest discomfort.  He was noted to have a heart murmur on exam and had an echocardiogram done in July 2019 which showed ejection fraction 55 to 60% and a mean aortic valve gradient of 25.5 mmHg with an aortic valve area of 0.8 cm.  He was referred to Dr. Sallyanne Kuster.  His most recent follow-up echo in January 2020 showed a mean gradient across aortic valve of 46 mmHg with peak gradient of 81 mmHg.  Aortic valve area was 0.86 cm.  There is moderate aortic insufficiency.  Left ventricular ejection fraction was 60 to 65%.  He had carotid Dopplers done at [facility] in July 2019 which showed a 50 to 70% left internal carotid artery stenosis.  The patient was seen by Dr. Angelena Form on 10/27/2018 and was felt to be a suitable candidate for transcatheter aortic valve replacement.  The patient notes that over the past month he has further progression of exertional symptoms including shortness of breath and some chest pressure.  He was seen in the emergency room and electrocardiogram showed no ischemic changes.  He reports having some dizziness but no syncope.  He has had some episodes of chest discomfort at rest.  He denies any peripheral edema.  He is here today with his partner.  Past Medical History:  Diagnosis Date    Aortic stenosis    Cancer (HCC)    basal cell   Carotid artery stenosis    ED (erectile dysfunction)    Elevated PSA     Past Surgical History:  Procedure Laterality Date   EXTERNAL EAR SURGERY     melanoma removed   HERNIA REPAIR     RIGHT/LEFT HEART CATH AND CORONARY ANGIOGRAPHY N/A 09/29/2018   Procedure: RIGHT/LEFT HEART CATH AND CORONARY ANGIOGRAPHY;  Surgeon: Burnell Blanks, MD;  Location: [facility];  Service: Cardiovascular;  Laterality: N/A;    Family History  Problem Relation Age of Onset   Heart disease Brother     Social History   Socioeconomic History   Marital status: Married    Spouse name: Not on file   Number of children: 0   Years of education: Not on file   Highest education level: Not on file  Occupational History   Occupation: Retired-Human Animator strain: Not on file   Food insecurity:    Worry: Not on file    Inability: Not on file   Transportation needs:    Medical: Not on file    Non-medical: Not on file  Tobacco Use   Smoking status: Never Smoker   Smokeless tobacco: Never Used  Substance and Sexual Activity   Alcohol use: No   Drug use: No   Sexual activity: Not on file  Lifestyle   Physical  activity:    Days per week: Not on file    Minutes per session: Not on file   Stress: Not on file  Relationships   Social connections:    Talks on phone: Not on file    Gets together: Not on file    Attends religious service: Not on file    Active member of club or organization: Not on file    Attends meetings of clubs or organizations: Not on file    Relationship status: Not on file   Intimate partner violence:    Fear of current or ex partner: Not on file    Emotionally abused: Not on file    Physically abused: Not on file    Forced sexual activity: Not on file  Other Topics Concern   Not on file  Social History Narrative   Not on file    Current  Outpatient Medications  Medication Sig Dispense Refill   Polyethyl Glycol-Propyl Glycol (LUBRICANT EYE DROPS) 0.4-0.3 % SOLN Place 1-2 drops into both eyes 3 (three) times daily as needed (dry/irritated eyes.).      sildenafil (VIAGRA) 25 MG tablet Take 25 mg by mouth daily as needed for erectile dysfunction.      traZODone (DESYREL) 50 MG tablet Take 50 mg by mouth at bedtime as needed (sleep).      No current facility-administered medications for this visit.     No Known Allergies    Review of Systems:   General:  normal appetite, decreased energy, no weight gain, no weight loss, no fever  Cardiac:  + chest pain with exertion, + chest pain at rest, +SOB with  exertion, + resting SOB, no PND, + orthopnea, no palpitations, no arrhythmia, no atrial fibrillation, no LE edema, + dizzy spells, no syncope  Respiratory:  + shortness of breath, no home oxygen, no productive cough, no dry cough, no bronchitis, no wheezing, no hemoptysis, no asthma, no pain with inspiration or cough, no sleep apnea, no CPAP at night  GI:   no difficulty swallowing, no reflux, no frequent heartburn, no hiatal hernia, no abdominal pain, no constipation, no diarrhea, no hematochezia, no hematemesis, no melena  GU:   no dysuria,  no frequency, no urinary tract infection, no hematuria, no enlarged prostate, no kidney stones, no kidney disease  Vascular:  no pain suggestive of claudication, no pain in feet, no leg cramps, no varicose veins, no DVT, no non-healing foot ulcer  Neuro:   no stroke, no TIA's, no seizures, no headaches, no temporary blindness one eye,  no slurred speech, no peripheral neuropathy, no chronic pain, no instability of gait, no memory/cognitive dysfunction  Musculoskeletal: no arthritis, no joint swelling, no myalgias, no difficulty walking, normal mobility   Skin:   no rash, no itching, no skin infections, no pressure sores or ulcerations  Psych:   no anxiety, no depression, no nervousness, no  unusual recent stress  Eyes:   no blurry vision, no floaters, no recent vision changes, no wears glasses or contacts  ENT:   no hearing loss, no loose or painful teeth, no dentures, last saw dentist within past 6 months.  He is followed at the [facility] and has a cracked tooth that he needs to get fixed but has been put off due to the coronavirus pandemic.  Hematologic:  no easy bruising, no abnormal bleeding, no clotting disorder, no frequent epistaxis  Endocrine:  no diabetes, does not check CBG's at home  Physical Exam:   BP 140/66    Pulse (!) 51    Temp 97.6 F (36.4 C) (Temporal)    Resp 20    Ht 5\' 9"  (1.753 m)    Wt 168 lb (76.2 kg)    SpO2 96% Comment: RA   BMI 24.81 kg/m   General:  Fit and well-appearing  HEENT:  Unremarkable, NCAT, PERLA, EOMI, oropharynx clear.  Neck:   no JVD, no bruits, no adenopathy or thyromegaly  Chest:   clear to auscultation, symmetrical breath sounds, no wheezes, no rhonchi   CV:   RRR, grade III/VI crescendo/decrescendo murmur heard best at RSB,  no diastolic murmur  Abdomen:  soft, non-tender, no masses or organomegaly  Extremities:  warm, well-perfused, pulses palpable, no LE edema  Rectal/GU  Deferred  Neuro:   Grossly non-focal and symmetrical throughout  Skin:   Clean and dry, no rashes, no breakdown   Diagnostic Tests:  Patient Name:   Joshua Hess   Date of Exam: 08/09/2018 Medical Rec #:  [id number]     Height:       69.0 in Accession #:    [id number]    Weight:       173.0 lb Date of Birth:  Dec 29, 1940      BSA:          1.94 m Patient Age:    60 years      BP:           160/76 mmHg Patient Gender: M             HR:           60 bpm. Exam Location:  [facility]    Procedure: 2D Echo, 3D Echo, Cardiac Doppler, Color Doppler and Strain Analysis  Indications:    R01.1 Murmur History:        Patient has prior history of Echocardiogram examinations. ECHO                 EF55%, AS mean gradient 25.5, peak gradient  42.7, Carotid                 stenosis.; Aortic Valve Disease. Sonographer:    Basilia Jumbo RDCS Referring Phys: [place]    1. The left ventricle appears to be mildly increased in size, have normal wall thickness, with 60-65%. Echo evidence of impaired relaxation in diastolic filling patterns.  2. Right ventricular systolic pressure is is moderately elevated.  3. The right ventricle is normal in size, has normal wall thickness and normal systolic function.  4. Normal left atrial size.  5. Normal right atrial size.  6. The mitral valve is degenerative.  7. Normal tricuspid valve.  8. Aortic valve regurgitation is moderate by color flow Doppler.  9. Aortic valve tricuspid. 10. Severe stenosis of the aortic valve. 11. There is moderate sclerosis of the aortic valve. 12. There is severe thickening of the aortic valve. 13. No atrial level shunt detected by color flow Doppler.  FINDINGS  Left Ventricle: The left ventricle appears to be mildly increased in size, have normal wall thickness, with normal systolic function with a 65-68%. Echo evidence of impaired relaxation in diastolic filling patterns. Right Ventricle: The right ventricle is normal in size, has normal wall thickness and normal systolic function. Right ventricular systolic pressure is is moderately elevated with an estimated pressure of 42.3 mmHg. Left Atrium: The left atrium is normal in size. Right Atrium: The right atrial  size is normal in size. Interatrial Septum: No atrial level shunt detected by color flow Doppler.  Pericardium: There is no evidence of pericardial effusion. Mitral Valve: The mitral valve is degenerative in appearance. There is mild thickening of the mitral valve. Tricuspid Valve: The tricuspid valve is normal in structure. Tricuspid regurgitation was not visualized by color flow Doppler. The tricuspid regurgitant velocity is 2.93 m/s, and with an estimated right atrial pressure  of 8 mmHg, the right ventricular  systolic pressure is mildly elevated at 42.3 mmHg. Aortic Valve: The aortic valve tricuspid. There is severe thickening of the aortic valve with moderately decreased cusp excursion. There is moderate sclerosis of the aortic valve with moderately decreased cusp excursion. The calculated aortic valve area  is 0.87 cm, consistent with severe stenosis. Aortic valve regurgitation is moderate by color flow Doppler with a pressure half time of 592 msec. Pulmonic Valve: The pulmonic valve is normal. Pulmonic valve regurgitation is not visualized by color flow Doppler. Venous: The inferior vena cava was normal in size with greater than 50% respiratory variablity.   LEFT VENTRICLE PLAX 2D (Teich) LV EF:          54.4 %   Diastology LVIDd:          5.60 cm  LV e' lateral:   0.1 m/s LVIDs:          4.00 cm  LV E/e' lateral: 12.0 LV PW:          0.90 cm  LV e' medial:    0.0 m/s LV IVS:         0.80 cm  LV E/e' medial:  14.2 LVOT diam:      2.00 cm LV SV:          84 ml LVOT Area:      3.14 cm  RIGHT VENTRICLE RV S prime:     0.14 m/s TAPSE (M-mode): 2.8 cm RVSP:           42.3 mmHg  LEFT ATRIUM              Index       RIGHT ATRIUM LA diam:        4.20 cm  2.16 cm/m  RA Pressure: 8 mmHg LA Vol (A2C):   109.0 ml 56.11 ml/m LA Vol (A4C):   77.6 ml  39.95 ml/m LA Biplane Vol: 97.1 ml  49.99 ml/m  AORTIC VALVE AV Area (Vmax):    0.99 cm AV Area (Vmean):   0.86 cm AV Area (VTI):     0.87 cm AV Vmax:           4.52 m/s AV Vmean:          3.175 m/s AV VTI:            1.108 m AV Peak Grad:      81.9 mmHg AV Mean Grad:      46.0 mmHg LVOT Vmax:         1.42 m/s LVOT Vmean:        0.874 m/s LVOT VTI:          0.307 m LVOT/AV VTI ratio: 0.28 AR PHT:            592 msec   AORTA Ao Root diam: 3.60 cm Ao Asc diam:  3.40 cm  MITRAL VALVE               TR Peak grad: 34.3 mmHg MV Area (PHT): 2.20 cm  TR Vmax:      2.93 m/s MV PHT:        100.05  msec RAP:          8 mmHg MV Decel Time: 345 msec    RVSP:         42.3 mmHg MV E velocity: 0.62 m/s MV A velocity: 0.65 m/s MV E/A ratio:  0.95    Candee Furbish MD Electronically signed by Candee Furbish MD Signature Date/Time: 08/09/2018/4:51:59 PM    Physicians   Panel Physicians Referring Physician Case Authorizing Physician  Burnell Blanks, MD (Primary)    Procedures   RIGHT/LEFT HEART CATH AND CORONARY ANGIOGRAPHY  Conclusion     Prox RCA to Mid RCA lesion is 20% stenosed.  Mid RCA lesion is 30% stenosed.  Ost Cx to Prox Cx lesion is 30% stenosed.  Prox LAD lesion is 10% stenosed.  Mid LAD lesion is 20% stenosed.  Mid LAD to Dist LAD lesion is 20% stenosed.   1. Mild non-obstructive CAD 2. Severe aortic stenosis by echo (by cath mean gradient 17. 52mmHg and peak to peak gradient 24 mmHg)  Recommendations: Continue workup for TAVR   Recommendations   Antiplatelet/Anticoag Continue workup for TAVR  Indications   Severe aortic stenosis [I35.0 (ICD-10-CM)]  Procedural Details   Technical Details Indication: Severe AS  Procedure: The risks, benefits, complications, treatment options, and expected outcomes were discussed with the patient. The patient and/or family concurred with the proposed plan, giving informed consent. The patient was brought to the cath lab after IV hydration was given. The patient was  sedated with Versed and Fentanyl. The IV catheter in the right antecubital vein was changed for a 5 French sheath using sterile procedure. Right heart catheterization performed with a balloon tipped catheter. The right wrist was prepped and draped in a sterile fashion. 1% lidocaine was used for local anesthesia. Using the modified Seldinger access technique, a 5 French sheath was placed in the right radial artery. 3 mg Verapamil was given through the sheath. 4000 units IV heparin was given. Standard diagnostic catheters were used to perform selective  coronary angiography. I crossed the aortic valve with an AL-1 catheter and a straight wire. The sheath was removed from the right radial artery and a Terumo hemostasis band was applied at the arteriotomy site on the right wrist.    Estimated blood loss <50 mL.   During this procedure medications were administered to achieve and maintain moderate conscious sedation while the patient's heart rate, blood pressure, and oxygen saturation were continuously monitored and I was present face-to-face 100% of this time.  Medications  (Filter: Administrations occurring from 09/29/18 0735 to 09/29/18 0845)  Medication Rate/Dose/Volume Action  Date Time   fentaNYL (SUBLIMAZE) injection (mcg) 25 mcg Given 09/29/18 0750   Total dose as of 11/02/18 1446 25 mcg Given 0807   50 mcg        midazolam (VERSED) injection (mg) 1 mg Given 09/29/18 0750   Total dose as of 11/02/18 1446 1 mg Given 0807   2 mg        lidocaine (PF) (XYLOCAINE) 1 % injection (mL) 2 mL Given 09/29/18 0807   Total dose as of 11/02/18 1446 5 mL Given 0813   7 mL        Radial Cocktail/Verapamil only (mL) 10 mL Given 09/29/18 0814   Total dose as of 11/02/18 1446        10 mL  heparin injection (Units) 4,000 Units Given 09/29/18 0816   Total dose as of 11/02/18 1446        4,000 Units        Heparin (Porcine) in NaCl 1000-0.9 UT/500ML-% SOLN (mL) 500 mL Given 09/29/18 0840   Total dose as of 11/02/18 1446 500 mL Given 0840   1,000 mL        iohexol (OMNIPAQUE) 350 MG/ML injection (mL) 80 mL Given 09/29/18 0840   Total dose as of 11/02/18 1446        80 mL        Sedation Time   Sedation Time Physician-1: 40 minutes 12 seconds  Complications   Complications documented before study signed (09/29/2018 8:47 AM EDT)    RIGHT/LEFT HEART CATH AND CORONARY ANGIOGRAPHY   None Documented by Burnell Blanks, MD 09/29/2018 8:38 AM EDT  Time Range: Intraprocedure      Coronary Findings   Diagnostic  Dominance: Right    Left Anterior Descending  Vessel is large.  Prox LAD lesion 10% stenosed  Prox LAD lesion is 10% stenosed. The lesion is calcified.  Mid LAD lesion 20% stenosed  Mid LAD lesion is 20% stenosed. The lesion is calcified.  Mid LAD to Dist LAD lesion 20% stenosed  Mid LAD to Dist LAD lesion is 20% stenosed.  Left Circumflex  Vessel is large.  Ost Cx to Prox Cx lesion 30% stenosed  Ost Cx to Prox Cx lesion is 30% stenosed.  Right Coronary Artery  Vessel is large.  Prox RCA to Mid RCA lesion 20% stenosed  Prox RCA to Mid RCA lesion is 20% stenosed.  Mid RCA lesion 30% stenosed  Mid RCA lesion is 30% stenosed. The lesion is calcified.  Intervention   No interventions have been documented.  Coronary Diagrams   Diagnostic  Dominance: Right    Intervention   Implants    No implant documentation for this case.  Syngo Images   Show images for CARDIAC CATHETERIZATION  MERGE Images   Show images for CARDIAC CATHETERIZATION   Link to Procedure Log   Procedure Log    Hemo Data    Most Recent Value  Fick Cardiac Output 5.53 L/min  Fick Cardiac Output Index 2.87 (L/min)/BSA  Aortic Mean Gradient 17.07 mmHg  Aortic Peak Gradient 24 mmHg  Aortic Valve Area 1.73  Aortic Value Area Index 0.9 cm2/BSA  RA A Wave 9 mmHg  RA V Wave 5 mmHg  RA Mean 4 mmHg  RV Systolic Pressure 30 mmHg  RV Diastolic Pressure 2 mmHg  RV EDP 8 mmHg  PA Systolic Pressure 27 mmHg  PA Diastolic Pressure 8 mmHg  PA Mean 14 mmHg  PW A Wave 14 mmHg  PW V Wave 13 mmHg  PW Mean 10 mmHg  AO Systolic Pressure 811 mmHg  AO Diastolic Pressure 51 mmHg  AO Mean 70 mmHg  LV Systolic Pressure 914 mmHg  LV Diastolic Pressure 8 mmHg  LV EDP 15 mmHg  AOp Systolic Pressure 782 mmHg  AOp Diastolic Pressure 58 mmHg  AOp Mean Pressure 83 mmHg  LVp Systolic Pressure 956 mmHg  LVp Diastolic Pressure 6 mmHg  LVp EDP Pressure 17 mmHg  QP/QS 1  TPVR Index 4.88 HRUI  TSVR Index 24.39 HRUI  PVR SVR Ratio 0.06   TPVR/TSVR Ratio 0.2    ADDENDUM REPORT: 11/02/2018 12:59  CLINICAL DATA:  Aortic stenosis  EXAM: Cardiac TAVR CT  TECHNIQUE: The patient was scanned on a [employer]  196 slice scanner. A 120 kV retrospective scan was triggered in the descending thoracic aorta at 111 HU's. Gantry rotation speed was 270 msecs and collimation was .9 mm. No beta blockade or nitro were given. The 3D data set was reconstructed in 5% intervals of the R-R cycle. Systolic and diastolic phases were analyzed on a dedicated work station using MPR, MIP and VRT modes. The patient received 80 cc of contrast.  FINDINGS: Aortic Valve: Tri leaflet calcified with restricted leaflet motion  Aorta: Mild calcific atherosclerosis normal arch vessels no aneurysm  Sinotubular Junction: 25 mm  Ascending Thoracic Aorta: 33 mm  Aortic Arch: 26 mm  Descending Thoracic Aorta: 28 mm  Sinus of Valsalva Measurements:  Non-coronary: 33.9 mm  Right - coronary: 29.5 mm  Left - coronary: 31.6 mm  Coronary Artery Height above Annulus:  Left Main: 13.7 mm above annulus  Right Coronary: 18.4 mm above annulus  Virtual Basal Annulus Measurements:  Maximum/Minimum Diameter: 28.4 mm x 24.4 mm  Perimeter: 85 mm  Area: 544 mm2  Coronary Arteries: Sufficient height above annulus for deployment  Optimum Fluoroscopic Angle for Delivery: LAO 14 caudal 14 degrees  IMPRESSION: 1. Calcified tri leaflet AV with annular area of 544 mm2 in 10% and 30% phases where leaflet opening is maximum This is upper limits of 26 mm Sapien 3 valve There is an area of nodular calcification at the base of the right coronary cusp. Although the sinuses are large the STJ is not May be prudent to size with a balloon during procedure  2.  Normal aortic root 3.3 cm  3.  Coronary arteries sufficient height above annulus for deployment  4.  Optimum angiographic angle for deployment LAO 14 Caudal 14  Jenkins Rouge   Electronically Signed   By: Jenkins Rouge M.D.   On: 11/02/2018 12:59  CLINICAL DATA:  78 year old male with history of severe aortic stenosis. Preprocedural study prior to potential transcatheter aortic valve replacement (TAVR) procedure.  EXAM: CT ANGIOGRAPHY CHEST, ABDOMEN AND PELVIS  TECHNIQUE: Multidetector CT imaging through the chest, abdomen and pelvis was performed using the standard protocol during bolus administration of intravenous contrast. Multiplanar reconstructed images and MIPs were obtained and reviewed to evaluate the vascular anatomy.  CONTRAST:  163mL OMNIPAQUE IOHEXOL 350 MG/ML SOLN  COMPARISON:  None.  FINDINGS: CTA CHEST FINDINGS  Cardiovascular: Heart size is mildly enlarged. There is no significant pericardial fluid, thickening or pericardial calcification. There is aortic atherosclerosis, as well as atherosclerosis of the great vessels of the mediastinum and the coronary arteries, including calcified atherosclerotic plaque in the left main, left anterior descending and right coronary arteries. Severe thickening calcification of the aortic valve.  Mediastinum/Lymph Nodes: No pathologically enlarged mediastinal or hilar lymph nodes. Esophagus is unremarkable in appearance. No axillary lymphadenopathy.  Lungs/Pleura: No suspicious appearing pulmonary nodules or masses are noted. No acute consolidative airspace disease. No pleural effusions.  Musculoskeletal/Soft Tissues: There are no aggressive appearing lytic or blastic lesions noted in the visualized portions of the skeleton.  CTA ABDOMEN AND PELVIS FINDINGS  Hepatobiliary: No suspicious cystic or solid hepatic lesions. No intra or extrahepatic biliary ductal dilatation. Gallbladder is normal in appearance.  Pancreas: No pancreatic mass. No pancreatic ductal dilatation. No pancreatic or peripancreatic fluid or inflammatory changes.  Spleen:  Unremarkable.  Adrenals/Urinary Tract: Bilateral kidneys and adrenal glands are normal in appearance. There is no hydroureteronephrosis. Urinary bladder is normal in appearance.  Stomach/Bowel: Normal appearance of the stomach. No pathologic dilatation of small  bowel or colon. Normal appendix.  Vascular/Lymphatic: Aortic atherosclerosis, with vascular findings and measurements pertinent to potential TAVR procedure, as detailed below. No aneurysm or dissection noted in the abdominal or pelvic vasculature. No lymphadenopathy noted in the abdomen or pelvis.  Reproductive: Prostate gland is enlarged and heterogeneous in appearance measuring 5.2 x 6.3 x 7.6 cm. Seminal vesicles are unremarkable in appearance.  Other: No significant volume of ascites.  No pneumoperitoneum.  Musculoskeletal: There are no aggressive appearing lytic or blastic lesions noted in the visualized portions of the skeleton.  VASCULAR MEASUREMENTS PERTINENT TO TAVR:  AORTA:  Minimal Aortic Diameter-15 x 15 mm  Severity of Aortic Calcification-moderate  RIGHT PELVIS:  Right Common Iliac Artery -  Minimal Diameter-11.5 x 11.2 mm  Tortuosity-mild  Calcification-mild  Right External Iliac Artery -  Minimal Diameter-9.7 x 9.4 mm  Tortuosity-severe  Calcification-none  Right Common Femoral Artery -  Minimal Diameter-11.2 x 11.0 mm  Tortuosity-mild  Calcification-mild  LEFT PELVIS:  Left Common Iliac Artery -  Minimal Diameter-12.0 x 10.3 mm  Tortuosity-mild  Calcification-mild  Left External Iliac Artery -  Minimal Diameter-9.8 x 8.8 mm  Tortuosity-severe  Calcification-none  Left Common Femoral Artery -  Minimal Diameter-10.8 x 10.8 mm  Tortuosity-mild  Calcification-mild  Review of the MIP images confirms the above findings.  IMPRESSION: 1. Vascular findings and measurements pertinent to potential TAVR procedure, as detailed  above. 2. Severe thickening and calcification of the aortic valve, compatible with the reported clinical history of severe aortic stenosis. 3. Aortic atherosclerosis, in addition to left main and 2 vessel coronary artery disease. 4. Prostatomegaly. 5. Additional incidental findings, as above.   Electronically Signed   By: Vinnie Langton M.D.   On: 11/01/2018 14:44  STS Risk Score: Procedure: Isolated AVR  Risk of Mortality: 0.835%  Renal Failure: 0.590%  Permanent Stroke: 1.060%  Prolonged Ventilation: 2.892%  DSW Infection: 0.069%  Reoperation: 3.748%  Morbidity or Mortality: 6.060%  Short Length of Stay: 56.720%  Long Length of Stay: 2.137%   Impression:  This 78 year old gentleman has stage D, severe, symptomatic aortic stenosis with New York Heart Association class III symptoms of exertional fatigue and shortness of breath consistent with chronic diastolic congestive heart failure as well as chest pressure at rest and with exercise and some dizziness.  His symptoms have been progressing over the past month.  I have personally reviewed his 2D echocardiogram, cardiac catheterization, and CTA studies.  His echocardiogram shows a trileaflet aortic valve with severe thickening and calcification of the leaflets with decreased mobility.  The mean gradient is 46 mmHg consistent with severe aortic stenosis.  There is also moderate aortic insufficiency.  Left ventricular ejection fraction is normal.  Cardiac catheterization shows mild nonobstructive coronary disease.  I agree that aortic valve replacement is indicated in this patient for relief of his symptoms and to prevent progressive left ventricular deterioration.  I discussed the options of open surgical aortic valve replacement and transcatheter aortic valve placement.  The patient would like to have TAVR if at all possible.  His gated cardiac CTA shows anatomy suitable for transcatheter aortic valve replacement.  There is a small  nodule of calcium at the base of the noncoronary leaflet that could increase the risk of perivalvular leak.  His abdominal and pelvic CTA shows adequate pelvic vascular anatomy to allow transfemoral insertion.  The patient and his partner were counseled at length regarding treatment alternatives for management of severe symptomatic aortic stenosis. The risks and benefits of  surgical intervention has been discussed in detail. Long-term prognosis with medical therapy was discussed. Alternative approaches such as conventional surgical aortic valve replacement, transcatheter aortic valve replacement, and palliative medical therapy were compared and contrasted at length. This discussion was placed in the context of the patient's own specific clinical presentation and past medical history. All of their questions have been addressed. The patient is eager to proceed with surgical management as soon as possible.   Following the decision to proceed with transcatheter aortic valve replacement, a discussion was held regarding what types of management strategies would be attempted intraoperatively in the event of life-threatening complications, including whether or not the patient would be considered a candidate for the use of cardiopulmonary bypass and/or conversion to open sternotomy for attempted surgical intervention.  He is a low risk surgical patient and is a candidate for sternotomy if needed to manage any intraoperative complications.  The patient has been advised of a variety of complications that might develop including but not limited to risks of death, stroke, paravalvular leak, aortic dissection or other major vascular complications, aortic annulus rupture, device embolization, cardiac rupture or perforation, mitral regurgitation, acute myocardial infarction, arrhythmia, heart block or bradycardia requiring permanent pacemaker placement, congestive heart failure, respiratory failure, renal failure, pneumonia,  infection, other late complications related to structural valve deterioration or migration, or other complications that might ultimately cause a temporary or permanent loss of functional independence or other long term morbidity. The patient provides full informed consent for the procedure as described and all questions were answered.    Plan:  He will be scheduled for transcatheter aortic valve placement on Tuesday, 11/08/2018.   I spent in excess of 60 minutes during the conduct of this office consultation and >50% of this time involved direct face-to-face encounter with the patient for counseling and/or coordination of their care.           Gaye Pollack, MD 11/02/2018 2:44 PM

## 2018-11-02 NOTE — Therapy (Signed)
[place], [place], [address] Phone: [phone]   Fax:  [phone]  Physical Therapy Evaluation/Pre TAVR  Patient Details  Name: Joshua Hess MRN: [id number] Date of Birth: 06-Mar-1941 Referring Provider (PT): Burnell Blanks, MD    Encounter Date: 11/02/2018  PT End of Session - 11/02/18 1219    Visit Number  1    PT Start Time  1130    PT Stop Time  1220    PT Time Calculation (min)  50 min    Activity Tolerance  Patient tolerated treatment well    Behavior During Therapy  [facility] for tasks assessed/performed       Past Medical History:  Diagnosis Date  . Aortic stenosis   . Cancer ([place])    basal cell  . Carotid artery stenosis   . ED (erectile dysfunction)   . Elevated PSA     Past Surgical History:  Procedure Laterality Date  . EXTERNAL EAR SURGERY     melanoma removed  . HERNIA REPAIR    . RIGHT/LEFT HEART CATH AND CORONARY ANGIOGRAPHY N/A 09/29/2018   Procedure: RIGHT/LEFT HEART CATH AND CORONARY ANGIOGRAPHY;  Surgeon: Burnell Blanks, MD;  Location: [place] [facility];  Service: Cardiovascular;  Laterality: N/A;    There were no vitals filed for this visit.   Subjective Assessment - 11/02/18 1138    Subjective  When I went in for my annual physical, MD found a heart murmur. Overall I feel kind of crappy. Started process to have valve replaced before Covid started. I go through periods of light dizziness. Doing 30-40 miles/week of walk/run without discomfort, PLOF was about 12-20 miles at a time running- about 50 miles/week.     Limitations  Standing    Currently in Pain?  No/denies         [facility] PT Assessment - 11/02/18 0001      Assessment   Medical Diagnosis  severe aortic stenosis    Referring Provider (PT)  Burnell Blanks, MD     Onset Date/Surgical Date  --   <1 year   Hand Dominance  Right    Next MD Visit  surgeon this afternoon    Prior Therapy  no       Precautions   Precautions  None      Restrictions   Weight Bearing Restrictions  No      Balance Screen   Has the patient fallen in the past 6 months  No      [place] residence    Living Arrangements  Non-relatives/Friends    Available Help at Discharge  Family      Prior Function   Level of [place]  Retired    Leisure  running, bike, Training and development officer   Overall Cognitive Status  Within Functional Limits for tasks assessed      Observation/Other Assessments   Focus on Therapeutic Outcomes (FOTO)   n/a      Sensation   Additional Comments  WFL      Posture/Postural Control   Posture Comments  WFL      ROM / Strength   AROM / PROM / Strength  Strength      Strength   Overall Strength Comments  WFL    Strength Assessment Site  Hand    Right/Left hand  Right;Left    Right Hand  Grip (lbs)  45    Left Hand Grip (lbs)  38       [facility] Pre-Surgical Assessment - 11/02/18 0001    5 Meter Walk Test- trial 1  2 sec    5 Meter Walk Test- trial 2  2 sec.     5 Meter Walk Test- trial 3  2 sec.    5 meter walk test average  2 sec    4 Stage Balance Test Position  4    Sit To Stand Test- trial 1  9 sec.    6 Minute Walk- Baseline  yes    BP (mmHg)  144/79    HR (bpm)  (!) 49    02 Sat (%RA)  99 %    Modified Borg Scale for Dyspnea  0- Nothing at all    Perceived Rate of Exertion (Borg)  6-    6 Minute Walk Post Test  yes    BP (mmHg)  168/88    HR (bpm)  59    02 Sat (%RA)  98 %    Modified Borg Scale for Dyspnea  0.5- Very, very slight shortness of breath    Perceived Rate of Exertion (Borg)  11- Fairly light    Aerobic Endurance Distance Walked  1530    Endurance additional comments  11% disability compared to age-related norm              Objective measurements completed on examination: See above findings.     Clinical Impression Statement: Pt is a 78 yo M presenting to OP PT for  evaluation prior to possible TAVR surgery due to severe aortic stenosis. Pt reports onset of SOB beginning less than a year1 ago. Symptoms are  limiting running, biking and hiking as well as creating a winded feeling with climbing stairs. Pt presents with WFL ROM and strength and denies musculoskeletal pain.Pt ambulated a total of 1530 feet in 6 minute walk and reported .5/10 SOB on modified scale for dyspena and 11/20 RPE on Borg's perceived exertion and pain scale at the end of the walk. During the 6 minute walk test, patient's HR increased to 77 BPM and O2 saturation decreased to 93%. Based on the Short Physical Performance Battery, patient has a frailty rating of 12/12 with </= 5/12 considered frail.        Plan - 11/02/18 1223    PT Frequency  --   one time pre TAVR evaluation   Consulted and Agree with Plan of Care  Patient        Visit Diagnosis: Difficulty in walking, not elsewhere classified - Plan: PT plan of care cert/re-cert     Problem List Patient Active Problem List   Diagnosis Date Noted  . Severe aortic stenosis   . Carotid stenosis, right 08/12/2018  . Sinus bradycardia 08/12/2018  . Aortic valve stenosis, nonrheumatic 08/12/2018  . Myalgia 11/01/2012  . Benign prostatic hypertrophy (BPH) with nocturia 11/01/2012   Markiah Janeway C. Dvon Jiles PT, DPT 11/02/18 12:30 PM   [facility] [address] [place], [place], [address] Phone: [phone]   Fax:  [phone]  Name: Joshua Hess MRN: [id number] Date of Birth: 78-Feb-1942

## 2018-11-03 ENCOUNTER — Other Ambulatory Visit: Payer: Self-pay

## 2018-11-03 NOTE — Pre-Procedure Instructions (Signed)
KAILEN HINKLE  11/03/2018      [place], [place] [address] [place] [address] Phone: [phone] Fax: [phone]    Your procedure is scheduled on Tuesday April 28th.  Report to [facility] Admitting at 5:30 A.M.  Call this number if you have problems the morning of surgery:  [phone]   Remember:  Do not eat or drink after midnight.     Take these medicines the morning of surgery with A SIP OF WATER - NONE   7 days prior to surgery STOP taking any Aspirin(unless otherwise instructed by your surgeon), Aleve, Naproxen, Ibuprofen, Motrin, Advil, Goody's, BC's, all herbal medications, fish oil, and all vitamins    Do not wear jewelry  Do not wear lotions, powders, or colognes, or deodorant.  Men may shave face and neck.  Do not bring valuables to the hospital.  [facility] is not responsible for any belongings or valuables.  Contacts, dentures or bridgework may not be worn into surgery.  Leave your suitcase in the car.  After surgery it may be brought to your room.  For patients admitted to the hospital, discharge time will be determined by your treatment team.  Patients discharged the day of surgery will not be allowed to drive home.   [facility]- Preparing For Surgery  Before surgery, you can play an important role. Because skin is not sterile, your skin needs to be as free of germs as possible. You can reduce the number of germs on your skin by washing with CHG (chlorahexidine gluconate) Soap before surgery.  CHG is an antiseptic cleaner which kills germs and bonds with the skin to continue killing germs even after washing.    Oral Hygiene is also important to reduce your risk of infection.  Remember - BRUSH YOUR TEETH THE MORNING OF SURGERY WITH YOUR REGULAR TOOTHPASTE  Please do not use if you have an allergy to CHG or antibacterial soaps. If your skin becomes reddened/irritated stop using  the CHG.  Do not shave (including legs and underarms) for at least 48 hours prior to first CHG shower. It is OK to shave your face.  Please follow these instructions carefully.   1. Shower the NIGHT BEFORE SURGERY and the MORNING OF SURGERY with CHG.   2. If you chose to wash your hair, wash your hair first as usual with your normal shampoo.  3. After you shampoo, rinse your hair and body thoroughly to remove the shampoo.  4. Use CHG as you would any other liquid soap. You can apply CHG directly to the skin and wash gently with a scrungie or a clean washcloth.   5. Apply the CHG Soap to your body ONLY FROM THE NECK DOWN.  Do not use on open wounds or open sores. Avoid contact with your eyes, ears, mouth and genitals (private parts). Wash Face and genitals (private parts)  with your normal soap.  6. Wash thoroughly, paying special attention to the area where your surgery will be performed.  7. Thoroughly rinse your body with warm water from the neck down.  8. DO NOT shower/wash with your normal soap after using and rinsing off the CHG Soap.  9. Pat yourself dry with a CLEAN TOWEL.  10. Wear CLEAN PAJAMAS to bed the night before surgery, wear comfortable clothes the morning of surgery  11. Place CLEAN SHEETS on your bed the night of your first shower and DO NOT  SLEEP WITH PETS.    Day of Surgery: Shower as stated above. Do not apply any deodorants/lotions.  Please wear clean clothes to the hospital/surgery center.   Remember to brush your teeth WITH YOUR REGULAR TOOTHPASTE.   Please read over the following fact sheets that you were given.

## 2018-11-03 NOTE — Progress Notes (Signed)
Thanks MCr 

## 2018-11-04 ENCOUNTER — Encounter (HOSPITAL_COMMUNITY): Payer: Self-pay

## 2018-11-04 ENCOUNTER — Other Ambulatory Visit: Payer: Self-pay

## 2018-11-04 ENCOUNTER — Ambulatory Visit (HOSPITAL_COMMUNITY)
Admission: RE | Admit: 2018-11-04 | Discharge: 2018-11-04 | Disposition: A | Discharge status: Home / Self Care | Payer: Medicare HMO | Source: Ambulatory Visit | Attending: Cardiovascular Disease | Admitting: Cardiovascular Disease

## 2018-11-04 ENCOUNTER — Encounter (HOSPITAL_COMMUNITY)
Admission: RE | Admit: 2018-11-04 | Discharge: 2018-11-04 | Disposition: A | Discharge status: Home / Self Care | Payer: Medicare HMO | Source: Ambulatory Visit | Attending: Cardiovascular Disease | Admitting: Cardiovascular Disease

## 2018-11-04 DIAGNOSIS — Z79899 Other long term (current) drug therapy: Secondary | ICD-10-CM | POA: Diagnosis not present

## 2018-11-04 DIAGNOSIS — I350 Nonrheumatic aortic (valve) stenosis: Secondary | ICD-10-CM

## 2018-11-04 DIAGNOSIS — I352 Nonrheumatic aortic (valve) stenosis with insufficiency: Secondary | ICD-10-CM | POA: Diagnosis not present

## 2018-11-04 DIAGNOSIS — I471 Supraventricular tachycardia: Secondary | ICD-10-CM | POA: Diagnosis not present

## 2018-11-04 DIAGNOSIS — Z1159 Encounter for screening for other viral diseases: Secondary | ICD-10-CM | POA: Insufficient documentation

## 2018-11-04 DIAGNOSIS — I493 Ventricular premature depolarization: Secondary | ICD-10-CM | POA: Diagnosis not present

## 2018-11-04 DIAGNOSIS — Z01818 Encounter for other preprocedural examination: Secondary | ICD-10-CM | POA: Insufficient documentation

## 2018-11-04 DIAGNOSIS — I2510 Atherosclerotic heart disease of native coronary artery without angina pectoris: Secondary | ICD-10-CM | POA: Diagnosis not present

## 2018-11-04 DIAGNOSIS — Z85820 Personal history of malignant melanoma of skin: Secondary | ICD-10-CM | POA: Diagnosis not present

## 2018-11-04 DIAGNOSIS — I6523 Occlusion and stenosis of bilateral carotid arteries: Secondary | ICD-10-CM | POA: Insufficient documentation

## 2018-11-04 DIAGNOSIS — I700 Atherosclerosis of aorta: Secondary | ICD-10-CM | POA: Diagnosis not present

## 2018-11-04 HISTORY — DX: Unspecified hearing loss, unspecified ear: H91.90

## 2018-11-04 LAB — BRAIN NATRIURETIC PEPTIDE: B Natriuretic Peptide: 395.7 pg/mL — ABNORMAL HIGH (ref 0.0–100.0)

## 2018-11-04 LAB — CBC
HCT: 44.2 % (ref 39.0–52.0)
Hemoglobin: 15.5 g/dL (ref 13.0–17.0)
MCH: 33.3 pg (ref 26.0–34.0)
MCHC: 35.1 g/dL (ref 30.0–36.0)
MCV: 95.1 fL (ref 80.0–100.0)
Platelets: 154 10*3/uL (ref 150–400)
RBC: 4.65 MIL/uL (ref 4.22–5.81)
RDW: 11.6 % (ref 11.5–15.5)
WBC: 5.8 10*3/uL (ref 4.0–10.5)
nRBC: 0 % (ref 0.0–0.2)

## 2018-11-04 LAB — ABO/RH: ABO/RH(D): O NEG

## 2018-11-04 LAB — BLOOD GAS, ARTERIAL
Acid-Base Excess: 3 mmol/L — ABNORMAL HIGH (ref 0.0–2.0)
Bicarbonate: 26.8 mmol/L (ref 20.0–28.0)
Drawn by: 470591
FIO2: 21
O2 Saturation: 97.3 %
Patient temperature: 98.6
pCO2 arterial: 38.9 mmHg (ref 32.0–48.0)
pH, Arterial: 7.452 — ABNORMAL HIGH (ref 7.350–7.450)
pO2, Arterial: 92.3 mmHg (ref 83.0–108.0)

## 2018-11-04 LAB — SURGICAL PCR SCREEN
MRSA, PCR: NEGATIVE
Staphylococcus aureus: NEGATIVE

## 2018-11-04 LAB — COMPREHENSIVE METABOLIC PANEL
ALT: 14 U/L (ref 0–44)
AST: 22 U/L (ref 15–41)
Albumin: 3.9 g/dL (ref 3.5–5.0)
Alkaline Phosphatase: 44 U/L (ref 38–126)
Anion gap: 13 (ref 5–15)
BUN: 17 mg/dL (ref 8–23)
CO2: 20 mmol/L — ABNORMAL LOW (ref 22–32)
Calcium: 9.2 mg/dL (ref 8.9–10.3)
Chloride: 104 mmol/L (ref 98–111)
Creatinine, Ser: 0.91 mg/dL (ref 0.61–1.24)
GFR calc Af Amer: >60 mL/min (ref >60)
GFR calc non Af Amer: >60 mL/min (ref >60)
Glucose, Bld: 102 mg/dL — ABNORMAL HIGH (ref 70–99)
Potassium: 4.4 mmol/L (ref 3.5–5.1)
Sodium: 137 mmol/L (ref 135–145)
Total Bilirubin: 0.5 mg/dL (ref 0.3–1.2)
Total Protein: 6.6 g/dL (ref 6.5–8.1)

## 2018-11-04 LAB — HEMOGLOBIN A1C
Hgb A1c MFr Bld: 5 % (ref 4.8–5.6)
Mean Plasma Glucose: 96.8 mg/dL

## 2018-11-04 LAB — URINALYSIS, ROUTINE W REFLEX MICROSCOPIC
Bilirubin Urine: NEGATIVE
Glucose, UA: NEGATIVE mg/dL
Ketones, ur: NEGATIVE mg/dL
Leukocytes,Ua: NEGATIVE
Nitrite: NEGATIVE
Protein, ur: NEGATIVE mg/dL
Specific Gravity, Urine: 1.013 (ref 1.005–1.030)
pH: 6 (ref 5.0–8.0)

## 2018-11-04 LAB — PROTIME-INR
INR: 1.1 (ref 0.8–1.2)
Prothrombin Time: 14 seconds (ref 11.4–15.2)

## 2018-11-04 LAB — SARS CORONAVIRUS 2 BY RT PCR (HOSPITAL ORDER, PERFORMED IN ~~LOC~~ HOSPITAL LAB): SARS Coronavirus 2: NEGATIVE

## 2018-11-04 LAB — APTT: aPTT: 30 seconds (ref 24–36)

## 2018-11-04 NOTE — Progress Notes (Signed)
PCP - Dr [name]  Cardiologist - [place]  Chest x-ray - 11/04/2018  EKG - 10/25/2018  Stress Test -   ECHO - 08/09/2018  Cardiac Cath - 09/29/2018  Sleep Study - no CPAP - no  LABS-CBC CBP PAT PTT A1C BNP ABG T/S UA  ASA-no  HA1C- Fasting Blood Sugar - NA   Pt denies having chest pain, sob, or fever at this time. All instructions explained to the pt, with a verbal understanding of the material. Pt agrees to go over the instructions while at home for a better understanding. The opportunity to ask questions was provided.  Patient denies that he or his family has experienced any of the following: Cough Fever >100.4 Runny Nose Sore Throat Difficulty breathing/ shortness of breath Travel in past 14 days- no

## 2018-11-04 NOTE — Anesthesia Preprocedure Evaluation (Addendum)
Anesthesia Evaluation  Patient identified by MRN, date of birth, ID band Patient awake    Reviewed: Allergy & Precautions, NPO status , Patient's Chart, lab work & pertinent test results  Airway Mallampati: II  TM Distance: >3 FB Neck ROM: Full    Dental no notable dental hx. (+) Teeth Intact, Missing, Dental Advisory Given,    Pulmonary neg pulmonary ROS, former smoker,    Pulmonary exam normal breath sounds clear to auscultation       Cardiovascular + Peripheral Vascular Disease  + Valvular Problems/Murmurs AS  Rhythm:Regular Rate:Normal + Systolic murmurs 1. The left ventricle appears to be mildly increased in size, have normal wall thickness, with 60-65%. Echo evidence of impaired relaxation in diastolic filling patterns.  2. Right ventricular systolic pressure is is moderately elevated.  3. The right ventricle is normal in size, has normal wall thickness and normal systolic function.  4. Normal left atrial size.  5. Normal right atrial size.  6. The mitral valve is degenerative.  7. Normal tricuspid valve.  8. Aortic valve regurgitation is moderate by color flow Doppler.  9. Aortic valve tricuspid. 10. Severe stenosis of the aortic valve. 11. There is moderate sclerosis of the aortic valve. 12. There is severe thickening of the aortic valve. 13. No atrial level shunt detected by color flow Doppler.  FINDINGS  Left Ventricle: The left ventricle appears to be mildly increased in size, have normal wall thickness, with normal systolic function with a 03-55%. Echo evidence of impaired relaxation in diastolic filling patterns. Right Ventricle: The right ventricle is normal in size, has normal wall thickness and normal systolic function. Right ventricular systolic pressure is is moderately elevated with an estimated pressure of 42.3 mmHg. Left Atrium: The left atrium is normal in size. Right Atrium: The right atrial size is normal  in size. Interatrial Septum: No atrial level shunt detected by color flow Doppler   Neuro/Psych negative neurological ROS  negative psych ROS   GI/Hepatic negative GI ROS, Neg liver ROS,   Endo/Other  negative endocrine ROS  Renal/GU negative Renal ROS  negative genitourinary   Musculoskeletal negative musculoskeletal ROS (+)   Abdominal   Peds negative pediatric ROS (+)  Hematology negative hematology ROS (+)   Anesthesia Other Findings   Reproductive/Obstetrics negative OB ROS                           Anesthesia Physical Anesthesia Plan  ASA: III  Anesthesia Plan: General   Post-op Pain Management:    Induction: Intravenous  PONV Risk Score and Plan: 2 and Ondansetron, Dexamethasone and Treatment may vary due to age or medical condition  Airway Management Planned: Oral ETT  Additional Equipment: Arterial line and CVP  Intra-op Plan:   Post-operative Plan: Extubation in OR  Informed Consent: I have reviewed the patients History and Physical, chart, labs and discussed the procedure including the risks, benefits and alternatives for the proposed anesthesia with the patient or authorized representative who has indicated his/her understanding and acceptance.     Dental advisory given  Plan Discussed with: CRNA and Surgeon  Anesthesia Plan Comments: (PAT note written 11/04/2018 by Myra Gianotti, PA-C. )       Anesthesia Quick Evaluation

## 2018-11-04 NOTE — Progress Notes (Signed)
Anesthesia Chart Review:  Case:  [id number] Date/Time:  11/08/18 0715   Procedures:      TRANSCATHETER AORTIC VALVE REPLACEMENT, TRANSFEMORAL (N/A Chest)     TRANSESOPHAGEAL ECHOCARDIOGRAM (TEE) (N/A )   Anesthesia type:  General   Pre-op diagnosis:  Severe Aortic Stenosis   Location:  [facility] OR ROOM 16 / [facility]   Surgeon:  Burnell Blanks, MD    CT surgeon: Gilford Raid, MD  DISCUSSION: Patient is a 78 year old male scheduled for the above procedure.  History includes former smoker (quit 1970), murmur/severe AS, carotid artery stenosis (88-41% LICA stenosis 12/6061), melanoma (s/p external ear surgery), hard of hearing. Only mild non-obstructive CAD by 09/2018 cardiac cath.  If no acute changes then I anticipate that he can proceed as planned. SARS Coronavirus 2 NEGATIVE test on 11/04/18.   VS: BP (!) 147/67   Pulse (!) 56   Temp 36.8 C   Resp 20   Ht 5\' 9"  (1.753 m)   Wt 77.6 kg   SpO2 100%   BMI 25.27 kg/m     PROVIDERS: [name], MD is PCP Sanda Klein, MD is primary cardiologist [place], Sherren Mocha, MD is vascular surgeon. Last visiti 04/12/18 for left carotid stenosis. One year follow-up recommended.   LABS: Labs reviewed: Acceptable for surgery. (all labs ordered are listed, but only abnormal results are displayed)  Labs Reviewed  BRAIN NATRIURETIC PEPTIDE - Abnormal; Notable for the following components:      Result Value   B Natriuretic Peptide 395.7 (*)    All other components within normal limits  COMPREHENSIVE METABOLIC PANEL - Abnormal; Notable for the following components:   CO2 20 (*)    Glucose, Bld 102 (*)    All other components within normal limits  URINALYSIS, ROUTINE W REFLEX MICROSCOPIC - Abnormal; Notable for the following components:   Hgb urine dipstick SMALL (*)    Bacteria, UA RARE (*)    All other components within normal limits  SURGICAL PCR SCREEN  SARS CORONAVIRUS 2 (HOSPITAL ORDER, [facility])  APTT  BLOOD  GAS, ARTERIAL  CBC  HEMOGLOBIN A1C  PROTIME-INR  TYPE AND SCREEN    IMAGES: CXR 11/04/18: IMPRESSION: No active cardiopulmonary disease.   EKG: 10/24/18:  Sinus or ectopic atrial rhythm Borderline left axis deviation Probable anteroseptal infarct, old Baseline wander in lead(s) V2   CV: CT coronary 11/01/18: IMPRESSION: 1. Calcified tri leaflet AV with annular area of 544 mm2 in 10% and 30% phases where leaflet opening is maximum This is upper limits of 26 mm Sapien 3 valve There is an area of nodular calcification at the base of the right coronary cusp. Although the sinuses are large the STJ is not May be prudent to size with a balloon during procedure 2.  Normal aortic root 3.3 cm 3.  Coronary arteries sufficient height above annulus for deployment 4.  Optimum angiographic angle for deployment LAO 14 Caudal 14  Cardiac cath 09/29/18:  Prox RCA to Mid RCA lesion is 20% stenosed.  Mid RCA lesion is 30% stenosed.  Ost Cx to Prox Cx lesion is 30% stenosed.  Prox LAD lesion is 10% stenosed.  Mid LAD lesion is 20% stenosed.  Mid LAD to Dist LAD lesion is 20% stenosed. 1. Mild non-obstructive CAD 2. Severe aortic stenosis by echo (by cath mean gradient 17. 3mmHg and peak to peak gradient 24 mmHg) Recommendations: Continue workup for TAVR  Echo 08/09/18: IMPRESSIONS  1. The left  ventricle appears to be mildly increased in size, have normal wall thickness, with 60-65%. Echo evidence of impaired relaxation in diastolic filling patterns.  2. Right ventricular systolic pressure is is moderately elevated.  3. The right ventricle is normal in size, has normal wall thickness and normal systolic function.  4. Normal left atrial size.  5. Normal right atrial size.  6. The mitral valve is degenerative.  7. Normal tricuspid valve.  8. Aortic valve regurgitation is moderate by color flow Doppler. Aortic valve tricuspid. Severe stenosis of the aortic valve. There is moderate  sclerosis of the aortic valve. There is severe thickening of the aortic valve.  AV Area (Vmax):    0.99 cm  AV Area (Vmean):   0.86 cm  AV Area (VTI):     0.87 cm  AV Vmax:           4.52 m/s  AV Vmean:          3.175 m/s  AV VTI:            1.108 m  AV Peak Grad:      81.9 mmHg  AV Mean Grad:      46.0 mmHg  LVOT Vmax:         1.42 m/s  LVOT Vmean:        0.874 m/s  LVOT VTI:          0.307 m  LVOT/AV VTI ratio: 0.28  AR PHT:            592 msec 9. No atrial level shunt detected by color flow Doppler.  24 hour Holter Monitor 05/18/18:  The dominant rhythm is sinus bradycardia, with an average rate of 55 bpm and a maximum achieved heart rate of 97 bpm.  There are no episodes of severe bradycardia or pauses in excess of 2.5 seconds.  During exercise, the maximum achieved heart rate is 97 bpm, representing only 67% of the maximum predicted for age.  There are rare PACs and a single 5 beat run of paroxysmal atrial tachycardia. No atrial fibrillation is seen.  There are occasional PVCs and rare ventricular couplets. Accelerated idioventricular rhythm is also seen. There is no evidence of ventricular tachycardia. Abnormal 24-hour Holter monitor with persistent bradycardia and evidence of chronotropic incompetence, achieving only 67% of the maximum predicted heart rate for age. Rare PACs and occasional PVCs are present.  Brief accelerated idioventricular rhythm is seen, but there is no evidence of ventricular tachycardia or atrial fibrillation. - Per Croitoru, Dani Gobble, MD on 05/23/18: "Monitor confirms the suspicion for sinus node dysfunction and chronotropic incompetence. During exercise, the heart rate topped out at only 97 bpm. This may explain his report of reduced exercise capacity in the last couple of years. A pacemaker may not be warranted yet since he feels well, but if fatigue/ poor exercise tolerance become a hindrance in the future, the treatment would be a pacemaker with a rate  response sensor."  Carotid US 02/03/18: IMPRESSION: Less than right ICA 50% stenosis based on NASCET stenosis criteria. 50-70% stenosis of the proximal left ICA.   Past Medical History:  Diagnosis Date  . Aortic stenosis   . Cancer ([place])    behind ear- [place]  . Carotid artery stenosis   . ED (erectile dysfunction)   . Elevated PSA   . Heart murmur   . HOH (hard of hearing)     Past Surgical History:  Procedure Laterality Date  . EXTERNAL EAR SURGERY  melanoma removed  . HERNIA REPAIR    . RIGHT/LEFT HEART CATH AND CORONARY ANGIOGRAPHY N/A 09/29/2018   Procedure: RIGHT/LEFT HEART CATH AND CORONARY ANGIOGRAPHY;  Surgeon: Burnell Blanks, MD;  Location: [place] [facility];  Service: Cardiovascular;  Laterality: N/A;    MEDICATIONS: . Polyethyl Glycol-Propyl Glycol (LUBRICANT EYE DROPS) 0.4-0.3 % SOLN  . sildenafil (VIAGRA) 25 MG tablet  . traZODone (DESYREL) 50 MG tablet   No current facility-administered medications for this encounter.     Myra Gianotti, PA-C Surgical Short Stay/Anesthesiology [facility] Phone [phone] [facility] Phone [phone] 11/04/2018 12:55 PM

## 2018-11-04 NOTE — Progress Notes (Signed)
Joshua Hess informed me when he was at PAT appointment that I could give any results to Joshua Hess, his spouse. I called and spoke with Joshua Hess and informed him that Coronavirus test was negative.  I ask Joshua Hess to remind patient that he should stay home until surgery, unless he had an emergency.  Joshua Hess said, " the only places we have gone is to the Drs and to the hospital, we do not go anywhere else."

## 2018-11-04 NOTE — Pre-Procedure Instructions (Signed)
Joshua Hess  11/04/2018    Your procedure is scheduled on Tuesday April 28th.  Report to [facility] Admitting at 5:30 A.M.                Your surgery or procedure is scheduled for 7:30 AM.  Call this number if you have problems the morning of surgery:[phone]  This is the number for the Pre- Surgical Desk.    Remember:  Do not eat or drink after midnight Monday, April 27.     Take these medicines the morning of surgery with A SIP OF WATER - NONE   7 days prior to surgery STOP taking any Aspirin(unless otherwise instructed by your surgeon), Aleve, Naproxen, Ibuprofen, Motrin, Advil, Goody's, BC's, all herbal medications, fish oil, and all vitamins  [place]- Preparing For Surgery  Before surgery, you can play an important role. Because skin is not sterile, your skin needs to be as free of germs as possible. You can reduce the number of germs on your skin by washing with CHG (chlorahexidine gluconate) Soap before surgery.  CHG is an antiseptic cleaner which kills germs and bonds with the skin to continue killing germs even after washing.    Oral Hygiene is also important to reduce your risk of infection.  Remember - BRUSH YOUR TEETH THE MORNING OF SURGERY WITH YOUR REGULAR TOOTHPASTE  Please do not use if you have an allergy to CHG or antibacterial soaps. If your skin becomes reddened/irritated stop using the CHG.  Do not shave (including legs and underarms) for at least 48 hours prior to first CHG shower. It is OK to shave your face.  Please follow these instructions carefully.   1. Shower the NIGHT BEFORE SURGERY and the MORNING OF SURGERY with CHG.   2. If you chose to wash your hair, wash your hair first as usual with your normal shampoo.  3. After you shampoo, wash your face and private area with the soap you use at home, then rinse your hair and body thoroughly to remove the shampoo and soap.  4. Use CHG as you would any other liquid soap. You can apply  CHG directly to the skin and wash gently with a scrungie or a clean washcloth.   5. Apply the CHG Soap to your body ONLY FROM THE NECK DOWN.  Do not use on open wounds or open sores. Avoid contact with your eyes, ears, mouth and genitals (private parts).   6. Wash thoroughly, paying special attention to the area where your surgery will be performed.  7. Thoroughly rinse your body with warm water from the neck down.  8. DO NOT shower/wash with your normal soap after using and rinsing off the CHG Soap.  9. Pat yourself dry with a CLEAN TOWEL.  10. Wear CLEAN PAJAMAS to bed the night before surgery, wear comfortable clothes the morning of surgery  11. Place CLEAN SHEETS on your bed the night of your first shower and DO NOT SLEEP WITH PETS.  Day of Surgery: Shower as stated above.  Do not apply any deodorants/lotions, powders or colognes..  Please wear clean clothes to the hospital/surgery center.   Remember to brush your teeth WITH YOUR REGULAR TOOTHPASTE.  Do not wear jewelry  Men may shave face and neck.  Do not bring valuables to the hospital.  [facility] is not responsible for any belongings or valuables.  Contacts, dentures or bridgework may not be worn into surgery.  Leave your  suitcase in the car.  After surgery it may be brought to your room.  For patients admitted to the hospital, discharge time will be determined by your treatment team.  Patients discharged the day of surgery will not be allowed to drive home.   Please read over the following fact sheets that you were given: Pain Booklet, Patient Instructions for Mupirocin Application, Coughing and Deep Breathing, Surgical Site Infections.

## 2018-11-07 MED ORDER — POTASSIUM CHLORIDE 2 MEQ/ML IV SOLN
80.0000 meq | INTRAVENOUS | Status: DC
  Filled 2018-11-07: qty 40

## 2018-11-07 MED ORDER — MAGNESIUM SULFATE 50 % IJ SOLN
40.0000 meq | INTRAMUSCULAR | Status: DC
  Filled 2018-11-07: qty 9.85

## 2018-11-07 MED ORDER — NOREPINEPHRINE BITARTRATE 1 MG/ML IV SOLN
0.0000 ug/min | INTRAVENOUS | Status: DC
  Filled 2018-11-07: qty 4

## 2018-11-07 MED ORDER — DEXMEDETOMIDINE HCL IN NACL 400 MCG/100ML IV SOLN
0.1000 ug/kg/h | INTRAVENOUS | Status: AC
  Administered 2018-11-08: .4 ug/kg/h via INTRAVENOUS
  Filled 2018-11-07: qty 100

## 2018-11-07 MED ORDER — SODIUM CHLORIDE 0.9 % IV SOLN
INTRAVENOUS | Status: DC
  Filled 2018-11-07: qty 30

## 2018-11-07 MED ORDER — VANCOMYCIN HCL 10 G IV SOLR
1250.0000 mg | INTRAVENOUS | Status: AC
  Administered 2018-11-08: 1250 mg via INTRAVENOUS
  Filled 2018-11-07: qty 1250

## 2018-11-07 MED ORDER — SODIUM CHLORIDE 0.9 % IV SOLN
1.5000 g | INTRAVENOUS | Status: AC
  Administered 2018-11-08: 1.5 g via INTRAVENOUS
  Filled 2018-11-07: qty 1.5

## 2018-11-07 NOTE — H&P (Signed)
[address]       [place] [address]             [phone]      Cardiothoracic Surgery Admission History and Physical   Referring Provider is Croitoru, Dani Gobble, MD Primary Cardiologist is Sanda Klein, MD PCP is [name], MD      Chief Complaint  Patient presents with   Aortic Stenosis        HPI:  The patient is a 78 year old gentleman who is been extremely active and physically fit for his entire adult life as an avid runner and Production designer, theatre/television/film.  For the past 1-1/2 years he is developed progressive exertional fatigue and has noticed that he has not been able to run as far due to fatigue.  He has not noted any shortness of breath or chest discomfort.  He was noted to have a heart murmur on exam and had an echocardiogram done in July 2019 which showed ejection fraction 55 to 60% and a mean aortic valve gradient of 25.5 mmHg with an aortic valve area of 0.8 cm.  He was referred to Dr. Sallyanne Kuster.  His most recent follow-up echo in January 2020 showed a mean gradient across aortic valve of 46 mmHg with peak gradient of 81 mmHg.  Aortic valve area was 0.86 cm.  There is moderate aortic insufficiency.  Left ventricular ejection fraction was 60 to 65%.  He had carotid Dopplers done at [facility] in July 2019 which showed a 50 to 70% left internal carotid artery stenosis.  The patient was seen by Dr. Angelena Form on 10/27/2018 and was felt to be a suitable candidate for transcatheter aortic valve replacement.  The patient notes that over the past month he has further progression of exertional symptoms including shortness of breath and some chest pressure.  He was seen in the emergency room and electrocardiogram showed no ischemic changes.  He reports having some dizziness but no syncope.  He has had some episodes of chest discomfort at rest.  He denies any peripheral edema.  He is here today with his partner.      Past Medical History:    Diagnosis Date   Aortic stenosis    Cancer (HCC)    basal cell   Carotid artery stenosis    ED (erectile dysfunction)    Elevated PSA          Past Surgical History:  Procedure Laterality Date   EXTERNAL EAR SURGERY     melanoma removed   HERNIA REPAIR     RIGHT/LEFT HEART CATH AND CORONARY ANGIOGRAPHY N/A 09/29/2018   Procedure: RIGHT/LEFT HEART CATH AND CORONARY ANGIOGRAPHY;  Surgeon: Burnell Blanks, MD;  Location: [facility];  Service: Cardiovascular;  Laterality: N/A;         Family History  Problem Relation Age of Onset   Heart disease Brother     Social History        Socioeconomic History   Marital status: Married    Spouse name: Not on file   Number of children: 0   Years of education: Not on file   Highest education level: Not on file  Occupational History   Occupation: Retired-Human Animator strain: Not on file   Food insecurity:    Worry: Not on file    Inability: Not on file   Transportation needs:    Medical: Not on file  Non-medical: Not on file  Tobacco Use   Smoking status: Never Smoker   Smokeless tobacco: Never Used  Substance and Sexual Activity   Alcohol use: No   Drug use: No   Sexual activity: Not on file  Lifestyle   Physical activity:    Days per week: Not on file    Minutes per session: Not on file   Stress: Not on file  Relationships   Social connections:    Talks on phone: Not on file    Gets together: Not on file    Attends religious service: Not on file    Active member of club or organization: Not on file    Attends meetings of clubs or organizations: Not on file    Relationship status: Not on file   Intimate partner violence:    Fear of current or ex partner: Not on file    Emotionally abused: Not on file    Physically abused: Not on file    Forced sexual activity: Not on file  Other  Topics Concern   Not on file  Social History Narrative   Not on file          Current Outpatient Medications  Medication Sig Dispense Refill   Polyethyl Glycol-Propyl Glycol (LUBRICANT EYE DROPS) 0.4-0.3 % SOLN Place 1-2 drops into both eyes 3 (three) times daily as needed (dry/irritated eyes.).      sildenafil (VIAGRA) 25 MG tablet Take 25 mg by mouth daily as needed for erectile dysfunction.      traZODone (DESYREL) 50 MG tablet Take 50 mg by mouth at bedtime as needed (sleep).      No current facility-administered medications for this visit.     No Known Allergies    Review of Systems:              General:                      normal appetite, decreased energy, no weight gain, no weight loss, no fever             Cardiac:                       + chest pain with exertion, + chest pain at rest, +SOB with  exertion, + resting SOB, no PND, + orthopnea, no palpitations, no arrhythmia, no atrial fibrillation, no LE edema, + dizzy spells, no syncope             Respiratory:                 + shortness of breath, no home oxygen, no productive cough, no dry cough, no bronchitis, no wheezing, no hemoptysis, no asthma, no pain with inspiration or cough, no sleep apnea, no CPAP at night             GI:                               no difficulty swallowing, no reflux, no frequent heartburn, no hiatal hernia, no abdominal pain, no constipation, no diarrhea, no hematochezia, no hematemesis, no melena             GU:                              no dysuria,  no frequency, no  urinary tract infection, no hematuria, no enlarged prostate, no kidney stones, no kidney disease             Vascular:                     no pain suggestive of claudication, no pain in feet, no leg cramps, no varicose veins, no DVT, no non-healing foot ulcer             Neuro:                         no stroke, no TIA's, no seizures, no headaches, no temporary blindness one eye,  no slurred speech, no  peripheral neuropathy, no chronic pain, no instability of gait, no memory/cognitive dysfunction             Musculoskeletal:         no arthritis, no joint swelling, no myalgias, no difficulty walking, normal mobility              Skin:                            no rash, no itching, no skin infections, no pressure sores or ulcerations             Psych:                         no anxiety, no depression, no nervousness, no unusual recent stress             Eyes:                           no blurry vision, no floaters, no recent vision changes, no wears glasses or contacts             ENT:                            no hearing loss, no loose or painful teeth, no dentures, last saw dentist within past 6 months.  He is followed at the [facility] and has a cracked tooth that he needs to get fixed but has been put off due to the coronavirus pandemic.             Hematologic:               no easy bruising, no abnormal bleeding, no clotting disorder, no frequent epistaxis             Endocrine:                   no diabetes, does not check CBG's at home                                                       Physical Exam:              BP 140/66    Pulse (!) 51    Temp 97.6 F (36.4 C) (Temporal)    Resp 20    Ht 5\' 9"  (1.753 m)    Wt 168 lb (76.2 kg)    SpO2 96% Comment:  RA   BMI 24.81 kg/m              General:                      Fit and well-appearing             HEENT:                       Unremarkable, NCAT, PERLA, EOMI, oropharynx clear.             Neck:                           no JVD, no bruits, no adenopathy or thyromegaly             Chest:                          clear to auscultation, symmetrical breath sounds, no wheezes, no rhonchi              CV:                              RRR, grade III/VI crescendo/decrescendo murmur heard best at RSB,  no diastolic murmur             Abdomen:                    soft, non-tender, no masses or organomegaly             Extremities:                  warm, well-perfused, pulses palpable, no LE edema             Rectal/GU                   Deferred             Neuro:                         Grossly non-focal and symmetrical throughout             Skin:                            Clean and dry, no rashes, no breakdown   Diagnostic Tests:  Patient Name: EMERICK WEATHERLY Date of Exam: 08/09/2018 Medical Rec #: [id number] Height: 69.0 in Accession #: [id number] Weight: 173.0 lb Date of Birth: 09/30/1940 BSA: 1.94 m Patient Age: 71 years BP: 160/76 mmHg Patient Gender: M HR: 60 bpm. Exam Location: [facility]   Procedure: 2D Echo, 3D Echo, Cardiac Doppler, Color Doppler and Strain Analysis  Indications: R01.1 Murmur History: Patient has prior history of Echocardiogram examinations. ECHO EF55%, AS mean gradient 25.5, peak gradient 42.7, Carotid stenosis.; Aortic Valve Disease. Sonographer: Basilia Jumbo RDCS Referring Phys: [place]   1. The left ventricle appears to be mildly increased in size, have normal wall thickness, with 60-65%. Echo evidence of impaired relaxation in diastolic filling patterns. 2. Right ventricular systolic pressure is is moderately elevated. 3. The right ventricle is normal in size, has normal wall thickness and normal systolic function. 4. Normal left atrial size. 5. Normal right atrial size. 6. The mitral valve  is degenerative. 7. Normal tricuspid valve. 8. Aortic valve regurgitation is moderate by color flow Doppler. 9. Aortic valve tricuspid. 10. Severe stenosis of the aortic valve. 11. There is moderate sclerosis of the aortic valve. 12. There is severe thickening of the aortic valve. 13. No atrial level shunt detected by color flow Doppler.  FINDINGS Left Ventricle: The left ventricle appears to be mildly increased in  size, have normal wall thickness, with normal systolic function with a 73-22%. Echo evidence of impaired relaxation in diastolic filling patterns. Right Ventricle: The right ventricle is normal in size, has normal wall thickness and normal systolic function. Right ventricular systolic pressure is is moderately elevated with an estimated pressure of 42.3 mmHg. Left Atrium: The left atrium is normal in size. Right Atrium: The right atrial size is normal in size. Interatrial Septum: No atrial level shunt detected by color flow Doppler.  Pericardium: There is no evidence of pericardial effusion. Mitral Valve: The mitral valve is degenerative in appearance. There is mild thickening of the mitral valve. Tricuspid Valve: The tricuspid valve is normal in structure. Tricuspid regurgitation was not visualized by color flow Doppler. The tricuspid regurgitant velocity is 2.93 m/s, and with an estimated right atrial pressure of 8 mmHg, the right ventricular  systolic pressure is mildly elevated at 42.3 mmHg. Aortic Valve: The aortic valve tricuspid. There is severe thickening of the aortic valve with moderately decreased cusp excursion. There is moderate sclerosis of the aortic valve with moderately decreased cusp excursion. The calculated aortic valve area  is 0.87 cm, consistent with severe stenosis. Aortic valve regurgitation is moderate by color flow Doppler with a pressure half time of 592 msec. Pulmonic Valve: The pulmonic valve is normal. Pulmonic valve regurgitation is not visualized by color flow Doppler. Venous: The inferior vena cava was normal in size with greater than 50% respiratory variablity.  LEFT VENTRICLE PLAX 2D (Teich) LV EF: 54.4 % Diastology LVIDd: 5.60 cm LV e' lateral: 0.1 m/s LVIDs: 4.00 cm LV E/e' lateral: 12.0 LV PW: 0.90 cm LV e' medial: 0.0 m/s LV IVS: 0.80 cm LV E/e' medial: 14.2 LVOT diam: 2.00 cm LV SV:  84 ml LVOT Area: 3.14 cm  RIGHT VENTRICLE RV S prime: 0.14 m/s TAPSE (M-mode): 2.8 cm RVSP: 42.3 mmHg  LEFT ATRIUM Index RIGHT ATRIUM LA diam: 4.20 cm 2.16 cm/m RA Pressure: 8 mmHg LA Vol (A2C): 109.0 ml 56.11 ml/m LA Vol (A4C): 77.6 ml 39.95 ml/m LA Biplane Vol: 97.1 ml 49.99 ml/m AORTIC VALVE AV Area (Vmax): 0.99 cm AV Area (Vmean): 0.86 cm AV Area (VTI): 0.87 cm AV Vmax: 4.52 m/s AV Vmean: 3.175 m/s AV VTI: 1.108 m AV Peak Grad: 81.9 mmHg AV Mean Grad: 46.0 mmHg LVOT Vmax: 1.42 m/s LVOT Vmean: 0.874 m/s LVOT VTI: 0.307 m LVOT/AV VTI ratio: 0.28 AR PHT: 592 msec  AORTA Ao Root diam: 3.60 cm Ao Asc diam: 3.40 cm  MITRAL VALVE TR Peak grad: 34.3 mmHg MV Area (PHT): 2.20 cm TR Vmax: 2.93 m/s MV PHT: 100.05 msec RAP: 8 mmHg MV Decel Time: 345 msec RVSP: 42.3 mmHg MV E velocity: 0.62 m/s MV A velocity: 0.65 m/s MV E/A ratio: 0.95   Candee Furbish MD Electronically signed by Candee Furbish MD Signature Date/Time: 08/09/2018/4:51:59 PM   Physicians   Panel Physicians Referring Physician Case Authorizing Physician  Burnell Blanks, MD (Primary)    Procedures   RIGHT/LEFT HEART CATH AND CORONARY ANGIOGRAPHY  Conclusion     Prox RCA to Mid RCA  lesion is 20% stenosed.  Mid RCA lesion is 30% stenosed.  Ost Cx to Prox Cx lesion is 30% stenosed.  Prox LAD lesion is 10% stenosed.  Mid LAD lesion is 20% stenosed.  Mid LAD to Dist LAD lesion is 20% stenosed.  1. Mild non-obstructive CAD 2. Severe aortic stenosis by echo (by cath mean gradient 17. 36mmHg and peak to peak gradient 24 mmHg)  Recommendations: Continue workup for TAVR   Recommendations   Antiplatelet/Anticoag Continue workup for TAVR  Indications   Severe aortic  stenosis [I35.0 (ICD-10-CM)]  Procedural Details   Technical Details Indication: Severe AS  Procedure: The risks, benefits, complications, treatment options, and expected outcomes were discussed with the patient. The patient and/or family concurred with the proposed plan, giving informed consent. The patient was brought to the cath lab after IV hydration was given. The patient was sedated with Versed and Fentanyl. The IV catheter in the right antecubital vein was changed for a 5 French sheath using sterile procedure. Right heart catheterization performed with a balloon tipped catheter. The right wrist was prepped and draped in a sterile fashion. 1% lidocaine was used for local anesthesia. Using the modified Seldinger access technique, a 5 French sheath was placed in the right radial artery. 3 mg Verapamil was given through the sheath. 4000 units IV heparin was given. Standard diagnostic catheters were used to perform selective coronary angiography. I crossed the aortic valve with an AL-1 catheter and a straight wire. The sheath was removed from the right radial artery and a Terumo hemostasis band was applied at the arteriotomy site on the right wrist.    Estimated blood loss <50 mL.   During this procedure medications were administered to achieve and maintain moderate conscious sedation while the patient's heart rate, blood pressure, and oxygen saturation were continuously monitored and I was present face-to-face 100% of this time.  Medications  (Filter: Administrations occurring from 09/29/18 0735 to 09/29/18 0845)          Medication Rate/Dose/Volume Action  Date Time   fentaNYL (SUBLIMAZE) injection (mcg) 25 mcg Given 09/29/18 0750   Total dose as of 11/02/18 1446 25 mcg Given 0807   50 mcg        midazolam (VERSED) injection (mg) 1 mg Given 09/29/18 0750   Total dose as of 11/02/18 1446 1 mg Given 0807   2 mg        lidocaine (PF) (XYLOCAINE) 1 % injection (mL) 2 mL Given  09/29/18 0807   Total dose as of 11/02/18 1446 5 mL Given 0813   7 mL        Radial Cocktail/Verapamil only (mL) 10 mL Given 09/29/18 0814   Total dose as of 11/02/18 1446        10 mL        heparin injection (Units) 4,000 Units Given 09/29/18 0816   Total dose as of 11/02/18 1446        4,000 Units        Heparin (Porcine) in NaCl 1000-0.9 UT/500ML-% SOLN (mL) 500 mL Given 09/29/18 0840   Total dose as of 11/02/18 1446 500 mL Given 0840   1,000 mL        iohexol (OMNIPAQUE) 350 MG/ML injection (mL) 80 mL Given 09/29/18 0840   Total dose as of 11/02/18 1446        80 mL        Sedation Time   Sedation Time Physician-1: 40 minutes 12 seconds  Complications  Complications documented before study signed (09/29/2018 8:47 AM EDT)    RIGHT/LEFT HEART CATH AND CORONARY ANGIOGRAPHY   None Documented by Burnell Blanks, MD 09/29/2018 8:38 AM EDT  Time Range: Intraprocedure      Coronary Findings   Diagnostic  Dominance: Right  Left Anterior Descending  Vessel is large.  Prox LAD lesion 10% stenosed  Prox LAD lesion is 10% stenosed. The lesion is calcified.  Mid LAD lesion 20% stenosed  Mid LAD lesion is 20% stenosed. The lesion is calcified.  Mid LAD to Dist LAD lesion 20% stenosed  Mid LAD to Dist LAD lesion is 20% stenosed.  Left Circumflex  Vessel is large.  Ost Cx to Prox Cx lesion 30% stenosed  Ost Cx to Prox Cx lesion is 30% stenosed.  Right Coronary Artery  Vessel is large.  Prox RCA to Mid RCA lesion 20% stenosed  Prox RCA to Mid RCA lesion is 20% stenosed.  Mid RCA lesion 30% stenosed  Mid RCA lesion is 30% stenosed. The lesion is calcified.  Intervention   No interventions have been documented.  Coronary Diagrams   Diagnostic  Dominance: Right    Intervention   Implants    No implant documentation for this case.  Syngo Images      Show images for CARDIAC CATHETERIZATION    MERGE Images   Show images for CARDIAC CATHETERIZATION   Link to Procedure Log   Procedure Log    Hemo Data    Most Recent Value  Fick Cardiac Output 5.53 L/min  Fick Cardiac Output Index 2.87 (L/min)/BSA  Aortic Mean Gradient 17.07 mmHg  Aortic Peak Gradient 24 mmHg  Aortic Valve Area 1.73  Aortic Value Area Index 0.9 cm2/BSA  RA A Wave 9 mmHg  RA V Wave 5 mmHg  RA Mean 4 mmHg  RV Systolic Pressure 30 mmHg  RV Diastolic Pressure 2 mmHg  RV EDP 8 mmHg  PA Systolic Pressure 27 mmHg  PA Diastolic Pressure 8 mmHg  PA Mean 14 mmHg  PW A Wave 14 mmHg  PW V Wave 13 mmHg  PW Mean 10 mmHg  AO Systolic Pressure 962 mmHg  AO Diastolic Pressure 51 mmHg  AO Mean 70 mmHg  LV Systolic Pressure 836 mmHg  LV Diastolic Pressure 8 mmHg  LV EDP 15 mmHg  AOp Systolic Pressure 629 mmHg  AOp Diastolic Pressure 58 mmHg  AOp Mean Pressure 83 mmHg  LVp Systolic Pressure 476 mmHg  LVp Diastolic Pressure 6 mmHg  LVp EDP Pressure 17 mmHg  QP/QS 1  TPVR Index 4.88 HRUI  TSVR Index 24.39 HRUI  PVR SVR Ratio 0.06  TPVR/TSVR Ratio 0.2    ADDENDUM REPORT: 11/02/2018 12:59  CLINICAL DATA: Aortic stenosis  EXAM: Cardiac TAVR CT  TECHNIQUE: The patient was scanned on a Siemens Force 546 slice scanner. A 120 kV retrospective scan was triggered in the descending thoracic aorta at 111 HU's. Gantry rotation speed was 270 msecs and collimation was .9 mm. No beta blockade or nitro were given. The 3D data set was reconstructed in 5% intervals of the R-R cycle. Systolic and diastolic phases were analyzed on a dedicated work station using MPR, MIP and VRT modes. The patient received 80 cc of contrast.  FINDINGS: Aortic Valve: Tri leaflet calcified with restricted leaflet motion  Aorta: Mild calcific atherosclerosis normal arch vessels no aneurysm  Sinotubular Junction: 25 mm  Ascending Thoracic Aorta: 33 mm  Aortic Arch: 26 mm  Descending Thoracic Aorta: 28  mm  Sinus of Valsalva Measurements:  Non-coronary: 33.9 mm  Right - coronary: 29.5 mm  Left - coronary: 31.6 mm  Coronary Artery Height above Annulus:  Left Main: 13.7 mm above annulus  Right Coronary: 18.4 mm above annulus  Virtual Basal Annulus Measurements:  Maximum/Minimum Diameter: 28.4 mm x 24.4 mm  Perimeter: 85 mm  Area: 544 mm2  Coronary Arteries: Sufficient height above annulus for deployment  Optimum Fluoroscopic Angle for Delivery: LAO 14 caudal 14 degrees  IMPRESSION: 1. Calcified tri leaflet AV with annular area of 544 mm2 in 10% and 30% phases where leaflet opening is maximum This is upper limits of 26 mm Sapien 3 valve There is an area of nodular calcification at the base of the right coronary cusp. Although the sinuses are large the STJ is not May be prudent to size with a balloon during procedure  2. Normal aortic root 3.3 cm  3. Coronary arteries sufficient height above annulus for deployment  4. Optimum angiographic angle for deployment LAO 14 Caudal 14  Jenkins Rouge   Electronically Signed By: Jenkins Rouge M.D. On: 11/02/2018 12:59  CLINICAL DATA: 78 year old male with history of severe aortic stenosis. Preprocedural study prior to potential transcatheter aortic valve replacement (TAVR) procedure.  EXAM: CT ANGIOGRAPHY CHEST, ABDOMEN AND PELVIS  TECHNIQUE: Multidetector CT imaging through the chest, abdomen and pelvis was performed using the standard protocol during bolus administration of intravenous contrast. Multiplanar reconstructed images and MIPs were obtained and reviewed to evaluate the vascular anatomy.  CONTRAST: 166mL OMNIPAQUE IOHEXOL 350 MG/ML SOLN  COMPARISON: None.  FINDINGS: CTA CHEST FINDINGS  Cardiovascular: Heart size is mildly enlarged. There is no significant pericardial fluid, thickening or pericardial calcification. There is aortic atherosclerosis, as well  as atherosclerosis of the great vessels of the mediastinum and the coronary arteries, including calcified atherosclerotic plaque in the left main, left anterior descending and right coronary arteries. Severe thickening calcification of the aortic valve.  Mediastinum/Lymph Nodes: No pathologically enlarged mediastinal or hilar lymph nodes. Esophagus is unremarkable in appearance. No axillary lymphadenopathy.  Lungs/Pleura: No suspicious appearing pulmonary nodules or masses are noted. No acute consolidative airspace disease. No pleural effusions.  Musculoskeletal/Soft Tissues: There are no aggressive appearing lytic or blastic lesions noted in the visualized portions of the skeleton.  CTA ABDOMEN AND PELVIS FINDINGS  Hepatobiliary: No suspicious cystic or solid hepatic lesions. No intra or extrahepatic biliary ductal dilatation. Gallbladder is normal in appearance.  Pancreas: No pancreatic mass. No pancreatic ductal dilatation. No pancreatic or peripancreatic fluid or inflammatory changes.  Spleen: Unremarkable.  Adrenals/Urinary Tract: Bilateral kidneys and adrenal glands are normal in appearance. There is no hydroureteronephrosis. Urinary bladder is normal in appearance.  Stomach/Bowel: Normal appearance of the stomach. No pathologic dilatation of small bowel or colon. Normal appendix.  Vascular/Lymphatic: Aortic atherosclerosis, with vascular findings and measurements pertinent to potential TAVR procedure, as detailed below. No aneurysm or dissection noted in the abdominal or pelvic vasculature. No lymphadenopathy noted in the abdomen or pelvis.  Reproductive: Prostate gland is enlarged and heterogeneous in appearance measuring 5.2 x 6.3 x 7.6 cm. Seminal vesicles are unremarkable in appearance.  Other: No significant volume of ascites. No pneumoperitoneum.  Musculoskeletal: There are no aggressive appearing lytic or blastic lesions noted in the  visualized portions of the skeleton.  VASCULAR MEASUREMENTS PERTINENT TO TAVR:  AORTA:  Minimal Aortic Diameter-15 x 15 mm  Severity of Aortic Calcification-moderate  RIGHT PELVIS:  Right Common Iliac Artery -  Minimal Diameter-11.5 x 11.2 mm  Tortuosity-mild  Calcification-mild  Right External Iliac Artery -  Minimal Diameter-9.7 x 9.4 mm  Tortuosity-severe  Calcification-none  Right Common Femoral Artery -  Minimal Diameter-11.2 x 11.0 mm  Tortuosity-mild  Calcification-mild  LEFT PELVIS:  Left Common Iliac Artery -  Minimal Diameter-12.0 x 10.3 mm  Tortuosity-mild  Calcification-mild  Left External Iliac Artery -  Minimal Diameter-9.8 x 8.8 mm  Tortuosity-severe  Calcification-none  Left Common Femoral Artery -  Minimal Diameter-10.8 x 10.8 mm  Tortuosity-mild  Calcification-mild  Review of the MIP images confirms the above findings.  IMPRESSION: 1. Vascular findings and measurements pertinent to potential TAVR procedure, as detailed above. 2. Severe thickening and calcification of the aortic valve, compatible with the reported clinical history of severe aortic stenosis. 3. Aortic atherosclerosis, in addition to left main and 2 vessel coronary artery disease. 4. Prostatomegaly. 5. Additional incidental findings, as above.   Electronically Signed By: Vinnie Langton M.D. On: 11/01/2018 14:44  STS Risk Score: Procedure: Isolated AVR  Risk of Mortality: 0.835%  Renal Failure: 0.590%  Permanent Stroke: 1.060%  Prolonged Ventilation: 2.892%  DSW Infection: 0.069%  Reoperation: 3.748%  Morbidity or Mortality: 6.060%  Short Length of Stay: 56.720%  Long Length of Stay: 2.137%   Impression:  This 78 year old gentleman has stage D, severe, symptomatic aortic stenosis with New York Heart Association class III symptoms of exertional fatigue and shortness of breath consistent with chronic  diastolic congestive heart failure as well as chest pressure at rest and with exercise and some dizziness.  His symptoms have been progressing over the past month.  I have personally reviewed his 2D echocardiogram, cardiac catheterization, and CTA studies.  His echocardiogram shows a trileaflet aortic valve with severe thickening and calcification of the leaflets with decreased mobility.  The mean gradient is 46 mmHg consistent with severe aortic stenosis.  There is also moderate aortic insufficiency.  Left ventricular ejection fraction is normal.  Cardiac catheterization shows mild nonobstructive coronary disease.  I agree that aortic valve replacement is indicated in this patient for relief of his symptoms and to prevent progressive left ventricular deterioration.  I discussed the options of open surgical aortic valve replacement and transcatheter aortic valve placement.  The patient would like to have TAVR if at all possible.  His gated cardiac CTA shows anatomy suitable for transcatheter aortic valve replacement.  There is a small nodule of calcium at the base of the noncoronary leaflet that could increase the risk of perivalvular leak.  His abdominal and pelvic CTA shows adequate pelvic vascular anatomy to allow transfemoral insertion.  The patient and his partner were counseled at length regarding treatment alternatives for management of severe symptomatic aortic stenosis. The risks and benefits of surgical intervention has been discussed in detail. Long-term prognosis with medical therapy was discussed. Alternative approaches such as conventional surgical aortic valve replacement, transcatheter aortic valve replacement, and palliative medical therapy were compared and contrasted at length. This discussion was placed in the context of the patient's own specific clinical presentation and past medical history. All of their questions have been addressed. The patient is eager to proceed with surgical management  as soon as possible.   Following the decision to proceed with transcatheter aortic valve replacement, a discussion was held regarding what types of management strategies would be attempted intraoperatively in the event of life-threatening complications, including whether or not the patient would be considered a candidate for the use of cardiopulmonary bypass and/or conversion to open sternotomy for attempted surgical intervention.  He is a low risk surgical patient and is a candidate for sternotomy if needed to manage any intraoperative complications.  The patient has been advised of a variety of complications that might develop including but not limited to risks of death, stroke, paravalvular leak, aortic dissection or other major vascular complications, aortic annulus rupture, device embolization, cardiac rupture or perforation, mitral regurgitation, acute myocardial infarction, arrhythmia, heart block or bradycardia requiring permanent pacemaker placement, congestive heart failure, respiratory failure, renal failure, pneumonia, infection, other late complications related to structural valve deterioration or migration, or other complications that might ultimately cause a temporary or permanent loss of functional independence or other long term morbidity. The patient provides full informed consent for the procedure as described and all questions were answered.    Plan:  Transcatheter aortic valve placement.     Gaye Pollack, MD

## 2018-11-07 NOTE — Progress Notes (Signed)
Denies fever, cough, shob, COVID-19 exposure, or travel since PAT appointment. 

## 2018-11-08 ENCOUNTER — Encounter (HOSPITAL_COMMUNITY): Payer: Self-pay | Admitting: *Deleted

## 2018-11-08 ENCOUNTER — Inpatient Hospital Stay (HOSPITAL_COMMUNITY): Payer: Medicare HMO

## 2018-11-08 ENCOUNTER — Encounter (HOSPITAL_COMMUNITY)
Admission: RE | Disposition: A | Discharge status: Home / Self Care | Payer: Self-pay | Source: Home / Self Care | Attending: Surgery

## 2018-11-08 ENCOUNTER — Inpatient Hospital Stay (HOSPITAL_COMMUNITY)
Admission: RE | Admit: 2018-11-08 | Discharge: 2018-11-15 | DRG: 220 | Disposition: A | Discharge status: Home / Self Care | Payer: Medicare HMO | Attending: Surgery | Admitting: Surgery

## 2018-11-08 ENCOUNTER — Inpatient Hospital Stay (HOSPITAL_COMMUNITY): Payer: Medicare HMO | Admitting: Certified Registered"

## 2018-11-08 ENCOUNTER — Other Ambulatory Visit (HOSPITAL_COMMUNITY): Payer: Medicare HMO

## 2018-11-08 ENCOUNTER — Inpatient Hospital Stay (HOSPITAL_COMMUNITY): Payer: Medicare HMO | Admitting: Vascular Surgery

## 2018-11-08 ENCOUNTER — Other Ambulatory Visit: Payer: Self-pay

## 2018-11-08 DIAGNOSIS — Z01810 Encounter for preprocedural cardiovascular examination: Secondary | ICD-10-CM | POA: Diagnosis not present

## 2018-11-08 DIAGNOSIS — E8770 Fluid overload, unspecified: Secondary | ICD-10-CM | POA: Diagnosis not present

## 2018-11-08 DIAGNOSIS — I351 Nonrheumatic aortic (valve) insufficiency: Secondary | ICD-10-CM | POA: Diagnosis present

## 2018-11-08 DIAGNOSIS — I2510 Atherosclerotic heart disease of native coronary artery without angina pectoris: Secondary | ICD-10-CM | POA: Diagnosis present

## 2018-11-08 DIAGNOSIS — Z452 Encounter for adjustment and management of vascular access device: Secondary | ICD-10-CM | POA: Diagnosis not present

## 2018-11-08 DIAGNOSIS — Z09 Encounter for follow-up examination after completed treatment for conditions other than malignant neoplasm: Secondary | ICD-10-CM

## 2018-11-08 DIAGNOSIS — I4891 Unspecified atrial fibrillation: Secondary | ICD-10-CM | POA: Diagnosis not present

## 2018-11-08 DIAGNOSIS — I10 Essential (primary) hypertension: Secondary | ICD-10-CM | POA: Diagnosis present

## 2018-11-08 DIAGNOSIS — Z952 Presence of prosthetic heart valve: Secondary | ICD-10-CM

## 2018-11-08 DIAGNOSIS — Z85820 Personal history of malignant melanoma of skin: Secondary | ICD-10-CM | POA: Diagnosis not present

## 2018-11-08 DIAGNOSIS — J90 Pleural effusion, not elsewhere classified: Secondary | ICD-10-CM | POA: Diagnosis not present

## 2018-11-08 DIAGNOSIS — I350 Nonrheumatic aortic (valve) stenosis: Secondary | ICD-10-CM

## 2018-11-08 DIAGNOSIS — I6529 Occlusion and stenosis of unspecified carotid artery: Secondary | ICD-10-CM | POA: Diagnosis present

## 2018-11-08 DIAGNOSIS — R5383 Other fatigue: Secondary | ICD-10-CM | POA: Diagnosis present

## 2018-11-08 DIAGNOSIS — N401 Enlarged prostate with lower urinary tract symptoms: Secondary | ICD-10-CM | POA: Diagnosis not present

## 2018-11-08 DIAGNOSIS — J9811 Atelectasis: Secondary | ICD-10-CM | POA: Diagnosis not present

## 2018-11-08 DIAGNOSIS — I6522 Occlusion and stenosis of left carotid artery: Secondary | ICD-10-CM | POA: Diagnosis present

## 2018-11-08 DIAGNOSIS — I4589 Other specified conduction disorders: Secondary | ICD-10-CM | POA: Diagnosis present

## 2018-11-08 DIAGNOSIS — I358 Other nonrheumatic aortic valve disorders: Secondary | ICD-10-CM | POA: Diagnosis not present

## 2018-11-08 DIAGNOSIS — Z8249 Family history of ischemic heart disease and other diseases of the circulatory system: Secondary | ICD-10-CM | POA: Diagnosis not present

## 2018-11-08 DIAGNOSIS — R351 Nocturia: Secondary | ICD-10-CM | POA: Diagnosis not present

## 2018-11-08 DIAGNOSIS — J95811 Postprocedural pneumothorax: Secondary | ICD-10-CM | POA: Diagnosis not present

## 2018-11-08 DIAGNOSIS — I6521 Occlusion and stenosis of right carotid artery: Secondary | ICD-10-CM | POA: Diagnosis not present

## 2018-11-08 DIAGNOSIS — Z954 Presence of other heart-valve replacement: Secondary | ICD-10-CM | POA: Diagnosis not present

## 2018-11-08 HISTORY — DX: Benign prostatic hyperplasia without lower urinary tract symptoms: N40.0

## 2018-11-08 HISTORY — DX: Presence of spectacles and contact lenses: Z97.3

## 2018-11-08 HISTORY — DX: Nonrheumatic aortic (valve) stenosis: I35.0

## 2018-11-08 HISTORY — DX: Other specified conduction disorders: I45.89

## 2018-11-08 SURGERY — IMPLANTATION, AORTIC VALVE, TRANSCATHETER, FEMORAL APPROACH
Anesthesia: General | Site: Chest

## 2018-11-08 MED ORDER — HEPARIN SODIUM (PORCINE) 1000 UNIT/ML IJ SOLN
INTRAMUSCULAR | Status: AC
  Filled 2018-11-08: qty 1

## 2018-11-08 MED ORDER — CHLORHEXIDINE GLUCONATE 4 % EX LIQD: 30.0000 mL | CUTANEOUS | Status: DC

## 2018-11-08 MED ORDER — MIDAZOLAM HCL 2 MG/2ML IJ SOLN
INTRAMUSCULAR | Status: DC | PRN
  Administered 2018-11-08 (×2): 1 mg via INTRAVENOUS

## 2018-11-08 MED ORDER — FENTANYL CITRATE (PF) 250 MCG/5ML IJ SOLN
INTRAMUSCULAR | Status: AC
  Filled 2018-11-08: qty 5

## 2018-11-08 MED ORDER — SODIUM CHLORIDE 0.9 % IV SOLN
INTRAVENOUS | Status: AC
  Filled 2018-11-08 (×3): qty 1.2

## 2018-11-08 MED ORDER — PHENYLEPHRINE 40 MCG/ML (10ML) SYRINGE FOR IV PUSH (FOR BLOOD PRESSURE SUPPORT)
PREFILLED_SYRINGE | INTRAVENOUS | Status: AC
  Filled 2018-11-08: qty 10

## 2018-11-08 MED ORDER — PHENYLEPHRINE 40 MCG/ML (10ML) SYRINGE FOR IV PUSH (FOR BLOOD PRESSURE SUPPORT)
PREFILLED_SYRINGE | INTRAVENOUS | Status: DC | PRN
  Administered 2018-11-08 (×2): 40 ug via INTRAVENOUS

## 2018-11-08 MED ORDER — ONDANSETRON HCL 4 MG/2ML IJ SOLN
INTRAMUSCULAR | Status: DC | PRN
  Administered 2018-11-08: 4 mg via INTRAVENOUS

## 2018-11-08 MED ORDER — ACETAMINOPHEN 325 MG PO TABS: 650.0000 mg | ORAL_TABLET | ORAL | Status: DC | PRN

## 2018-11-08 MED ORDER — FENTANYL CITRATE (PF) 100 MCG/2ML IJ SOLN
INTRAMUSCULAR | Status: DC | PRN
  Administered 2018-11-08 (×2): 50 ug via INTRAVENOUS

## 2018-11-08 MED ORDER — SUGAMMADEX SODIUM 200 MG/2ML IV SOLN
INTRAVENOUS | Status: DC | PRN
  Administered 2018-11-08: 160 mg via INTRAVENOUS

## 2018-11-08 MED ORDER — SODIUM CHLORIDE 0.9 % IV SOLN
250.0000 mL | INTRAVENOUS | Status: DC | PRN
  Administered 2018-11-10: 07:00:00 via INTRAVENOUS

## 2018-11-08 MED ORDER — ONDANSETRON HCL 4 MG/2ML IJ SOLN
INTRAMUSCULAR | Status: AC
  Filled 2018-11-08: qty 2

## 2018-11-08 MED ORDER — SUCCINYLCHOLINE CHLORIDE 20 MG/ML IJ SOLN
INTRAMUSCULAR | Status: DC | PRN
  Administered 2018-11-08: 140 mg via INTRAVENOUS

## 2018-11-08 MED ORDER — PROPOFOL 10 MG/ML IV BOLUS
INTRAVENOUS | Status: AC
  Filled 2018-11-08: qty 20

## 2018-11-08 MED ORDER — ROCURONIUM BROMIDE 50 MG/5ML IV SOSY
PREFILLED_SYRINGE | INTRAVENOUS | Status: DC | PRN
  Administered 2018-11-08: 50 mg via INTRAVENOUS

## 2018-11-08 MED ORDER — SODIUM CHLORIDE 0.9 % IV SOLN: INTRAVENOUS | Status: DC

## 2018-11-08 MED ORDER — CHLORHEXIDINE GLUCONATE 4 % EX LIQD: 60.0000 mL | Freq: Once | CUTANEOUS | Status: DC

## 2018-11-08 MED ORDER — ROCURONIUM BROMIDE 50 MG/5ML IV SOSY
PREFILLED_SYRINGE | INTRAVENOUS | Status: AC
  Filled 2018-11-08: qty 5

## 2018-11-08 MED ORDER — LACTATED RINGERS IV SOLN
INTRAVENOUS | Status: DC | PRN
  Administered 2018-11-08: 08:00:00 via INTRAVENOUS

## 2018-11-08 MED ORDER — SODIUM CHLORIDE 0.9% FLUSH
3.0000 mL | Freq: Two times a day (BID) | INTRAVENOUS | Status: DC
  Administered 2018-11-08 (×2): 3 mL via INTRAVENOUS

## 2018-11-08 MED ORDER — CHLORHEXIDINE GLUCONATE 0.12 % MT SOLN
15.0000 mL | Freq: Once | OROMUCOSAL | Status: AC
  Administered 2018-11-08: 15 mL via OROMUCOSAL
  Filled 2018-11-08: qty 15

## 2018-11-08 MED ORDER — MIDAZOLAM HCL 2 MG/2ML IJ SOLN
INTRAMUSCULAR | Status: AC
  Filled 2018-11-08: qty 2

## 2018-11-08 MED ORDER — ALPRAZOLAM 0.25 MG PO TABS
0.2500 mg | ORAL_TABLET | Freq: Three times a day (TID) | ORAL | Status: DC | PRN
  Administered 2018-11-08: 0.25 mg via ORAL
  Filled 2018-11-08: qty 1

## 2018-11-08 MED ORDER — ONDANSETRON HCL 4 MG/2ML IJ SOLN
4.0000 mg | Freq: Four times a day (QID) | INTRAMUSCULAR | Status: DC | PRN
  Administered 2018-11-08: 4 mg via INTRAVENOUS
  Filled 2018-11-08: qty 2

## 2018-11-08 MED ORDER — SODIUM CHLORIDE 0.9% FLUSH: 3.0000 mL | INTRAVENOUS | Status: DC | PRN

## 2018-11-08 MED ORDER — LIDOCAINE 2% (20 MG/ML) 5 ML SYRINGE
INTRAMUSCULAR | Status: AC
  Filled 2018-11-08: qty 5

## 2018-11-08 MED ORDER — LACTATED RINGERS IV SOLN
INTRAVENOUS | Status: DC | PRN
  Administered 2018-11-08: 07:00:00 via INTRAVENOUS

## 2018-11-08 MED ORDER — SUCCINYLCHOLINE CHLORIDE 200 MG/10ML IV SOSY
PREFILLED_SYRINGE | INTRAVENOUS | Status: AC
  Filled 2018-11-08: qty 10

## 2018-11-08 MED ORDER — TRAZODONE HCL 50 MG PO TABS
50.0000 mg | ORAL_TABLET | Freq: Every evening | ORAL | Status: DC | PRN
  Administered 2018-11-08 – 2018-11-09 (×2): 50 mg via ORAL
  Filled 2018-11-08 (×2): qty 1

## 2018-11-08 MED ORDER — PROPOFOL 10 MG/ML IV BOLUS
INTRAVENOUS | Status: DC | PRN
  Administered 2018-11-08: 120 mg via INTRAVENOUS

## 2018-11-08 SURGICAL SUPPLY — 89 items
BAG DECANTER FOR FLEXI CONT (MISCELLANEOUS) IMPLANT
BAG SNAP BAND KOVER 36X36 (MISCELLANEOUS) IMPLANT
BLADE CLIPPER SURG (BLADE) IMPLANT
BLADE OSCILLATING /SAGITTAL (BLADE) IMPLANT
BLADE STERNUM SYSTEM 6 (BLADE) IMPLANT
BLADE SURG 10 STRL SS (BLADE) IMPLANT
CABLE ADAPT CONN TEMP 6FT (ADAPTER) IMPLANT
CANNULA FEM VENOUS REMOTE 22FR (CANNULA) IMPLANT
CANNULA OPTISITE PERFUSION 16F (CANNULA) IMPLANT
CANNULA OPTISITE PERFUSION 18F (CANNULA) IMPLANT
CATH DIAG EXPO 6F VENT PIG 145 (CATHETERS) IMPLANT
CATH EXPO 5FR AL1 (CATHETERS) IMPLANT
CATH EXTERNAL FEMALE PUREWICK (CATHETERS) IMPLANT
CATH INFINITI 6F AL2 (CATHETERS) IMPLANT
CATH S G BIP PACING (CATHETERS) IMPLANT
CHLORAPREP W/TINT 26ML (MISCELLANEOUS) IMPLANT
CLIP VESOCCLUDE MED 24/CT (CLIP) IMPLANT
CLIP VESOCCLUDE SM WIDE 24/CT (CLIP) IMPLANT
CONT SPEC 4OZ CLIKSEAL STRL BL (MISCELLANEOUS) IMPLANT
COVER BACK TABLE 80X110 HD (DRAPES) IMPLANT
COVER DOME SNAP 22 D (MISCELLANEOUS) IMPLANT
COVER WAND RF STERILE (DRAPES) IMPLANT
CRADLE DONUT ADULT HEAD (MISCELLANEOUS) IMPLANT
DECANTER SPIKE VIAL GLASS SM (MISCELLANEOUS) IMPLANT
DERMABOND ADVANCED (GAUZE/BANDAGES/DRESSINGS)
DERMABOND ADVANCED .7 DNX12 (GAUZE/BANDAGES/DRESSINGS) IMPLANT
DEVICE CLOSURE PERCLS PRGLD 6F (VASCULAR PRODUCTS) IMPLANT
DRAPE INCISE IOBAN 66X45 STRL (DRAPES) IMPLANT
DRSG TEGADERM 4X4.75 (GAUZE/BANDAGES/DRESSINGS) IMPLANT
ELECT CAUTERY BLADE 6.4 (BLADE) IMPLANT
ELECT REM PT RETURN 9FT ADLT (ELECTROSURGICAL)
ELECTRODE REM PT RTRN 9FT ADLT (ELECTROSURGICAL) IMPLANT
FELT TEFLON 6X6 (MISCELLANEOUS) IMPLANT
FEMORAL VENOUS CANN RAP (CANNULA) IMPLANT
GAUZE SPONGE 4X4 12PLY STRL (GAUZE/BANDAGES/DRESSINGS) IMPLANT
GLOVE BIO SURGEON STRL SZ7.5 (GLOVE) IMPLANT
GLOVE BIO SURGEON STRL SZ8 (GLOVE) IMPLANT
GLOVE EUDERMIC 7 POWDERFREE (GLOVE) IMPLANT
GLOVE ORTHO TXT STRL SZ7.5 (GLOVE) IMPLANT
GOWN STRL REUS W/ TWL LRG LVL3 (GOWN DISPOSABLE) IMPLANT
GOWN STRL REUS W/ TWL XL LVL3 (GOWN DISPOSABLE) IMPLANT
GOWN STRL REUS W/TWL LRG LVL3 (GOWN DISPOSABLE)
GOWN STRL REUS W/TWL XL LVL3 (GOWN DISPOSABLE)
GUIDEWIRE SAFE TJ AMPLATZ EXST (WIRE) IMPLANT
GUIDEWIRE STRAIGHT .035 260CM (WIRE) IMPLANT
INSERT FOGARTY SM (MISCELLANEOUS) IMPLANT
KIT BASIN OR (CUSTOM PROCEDURE TRAY) IMPLANT
KIT DILATOR VASC 18G NDL (KITS) IMPLANT
KIT HEART LEFT (KITS) IMPLANT
KIT SUCTION CATH 14FR (SUCTIONS) IMPLANT
KIT TURNOVER KIT B (KITS) IMPLANT
LOOP VESSEL MAXI BLUE (MISCELLANEOUS) IMPLANT
LOOP VESSEL MINI RED (MISCELLANEOUS) IMPLANT
NEEDLE 22X1 1/2 (OR ONLY) (NEEDLE) IMPLANT
NS IRRIG 1000ML POUR BTL (IV SOLUTION) IMPLANT
PACK ENDO MINOR (CUSTOM PROCEDURE TRAY) IMPLANT
PAD ARMBOARD 7.5X6 YLW CONV (MISCELLANEOUS) IMPLANT
PAD ELECT DEFIB RADIOL ZOLL (MISCELLANEOUS) IMPLANT
PENCIL BUTTON HOLSTER BLD 10FT (ELECTRODE) IMPLANT
PERCLOSE PROGLIDE 6F (VASCULAR PRODUCTS)
SET MICROPUNCTURE 5F STIFF (MISCELLANEOUS) IMPLANT
SHEATH BRITE TIP 6FR 35CM (SHEATH) IMPLANT
SHEATH PINNACLE 6F 10CM (SHEATH) IMPLANT
SHEATH PINNACLE 8F 10CM (SHEATH) IMPLANT
SLEEVE REPOSITIONING LENGTH 30 (MISCELLANEOUS) IMPLANT
SPONGE LAP 18X18 RF (DISPOSABLE) IMPLANT
SPONGE LAP 4X18 RFD (DISPOSABLE) IMPLANT
STOPCOCK MORSE 400PSI 3WAY (MISCELLANEOUS) IMPLANT
SUT ETHIBOND X763 2 0 SH 1 (SUTURE) IMPLANT
SUT GORETEX CV 4 TH 22 36 (SUTURE) IMPLANT
SUT GORETEX CV4 TH-18 (SUTURE) IMPLANT
SUT MNCRL AB 3-0 PS2 18 (SUTURE) IMPLANT
SUT PROLENE 5 0 C 1 36 (SUTURE) IMPLANT
SUT PROLENE 6 0 C 1 30 (SUTURE) IMPLANT
SUT SILK  1 MH (SUTURE)
SUT SILK 1 MH (SUTURE) IMPLANT
SUT VIC AB 2-0 CT1 27 (SUTURE)
SUT VIC AB 2-0 CT1 TAPERPNT 27 (SUTURE) IMPLANT
SUT VIC AB 2-0 CTX 36 (SUTURE) IMPLANT
SUT VIC AB 3-0 SH 8-18 (SUTURE) IMPLANT
SYR 50ML LL SCALE MARK (SYRINGE) IMPLANT
SYR BULB IRRIGATION 50ML (SYRINGE) IMPLANT
SYR CONTROL 10ML LL (SYRINGE) IMPLANT
SYR MEDRAD MARK V 150ML (SYRINGE) IMPLANT
TOWEL GREEN STERILE (TOWEL DISPOSABLE) IMPLANT
TRANSDUCER W/STOPCOCK (MISCELLANEOUS) IMPLANT
TRAY FOLEY SLVR 16FR TEMP STAT (SET/KITS/TRAYS/PACK) IMPLANT
URINAL MALE W/LID DISP 1000CC (MISCELLANEOUS) IMPLANT
WIRE .035 3MM-J 145CM (WIRE) IMPLANT

## 2018-11-08 NOTE — Anesthesia Procedure Notes (Signed)
Anesthesia Procedure Image    

## 2018-11-08 NOTE — Anesthesia Procedure Notes (Signed)
Arterial Line Insertion Start/End4/28/2020 7:00 AM, 11/08/2018 7:10 AM Performed by: Barrington Ellison, CRNA, CRNA  Patient location: Pre-op. Preanesthetic checklist: patient identified, IV checked, risks and benefits discussed, monitors and equipment checked and pre-op evaluation Lidocaine 1% used for infiltration and patient sedated Right, radial was placed Catheter size: 20 G Hand hygiene performed  and maximum sterile barriers used  Allen's test indicative of satisfactory collateral circulation Attempts: 3 Procedure performed without using ultrasound guided technique. Following insertion, dressing applied and Biopatch. Post procedure assessment: normal  Patient tolerated the procedure well with no immediate complications.

## 2018-11-08 NOTE — Transfer of Care (Signed)
Immediate Anesthesia Transfer of Care Note  Patient: Joshua Hess  Procedure(s) Performed: TRANSCATHETER AORTIC VALVE REPLACEMENT, TRANSFEMORAL (N/A Chest) TRANSESOPHAGEAL ECHOCARDIOGRAM (TEE) (N/A ) Cancelled Procedure  Patient Location: PACU  Anesthesia Type:General  Level of Consciousness: awake, alert  and oriented  Airway & Oxygen Therapy: Patient Spontanous Breathing  Post-op Assessment: Report given to RN  Post vital signs: Reviewed and stable  Last Vitals:  Vitals Value Taken Time  BP 135/64 11/08/2018  8:50 AM  Temp    Pulse 48 11/08/2018  8:52 AM  Resp 20 11/08/2018  8:52 AM  SpO2 97 % 11/08/2018  8:52 AM  Vitals shown include unvalidated device data.  Last Pain:  Vitals:   11/08/18 0616  TempSrc:   PainSc: 0-No pain      Patients Stated Pain Goal: 4 (16/10/96 0454)  Complications: No apparent anesthesia complications

## 2018-11-08 NOTE — Op Note (Signed)
TCTS Operative Note:  Review of his gated cardiac CT images this am with our technical rep showed a significant filling defect in one of the aortic sinuses that looked like thrombus. This was not reported on the original CT reading. We decided to put him to sleep and do a TEE which was performed by Dr. Meda Coffee and reviewed by Dr. Angelena Form and myself. There is a sizable slightly mobile thrombus present in the sinus. Therefore TAVR is contraindicated. Open surgical AVR is the best treatment for this. Patient awakened and taken to cath lab recovery and will plan to admit to telemetry and perform open surgical AVR on Thursday am. I discussed this with patients partner [name] by phone and will discuss with the patient when he is awake.

## 2018-11-08 NOTE — Anesthesia Procedure Notes (Signed)
Central Venous Catheter Insertion Performed by: Myrtie Soman, MD, anesthesiologist Start/End4/28/2020 6:50 AM, 11/08/2018 7:10 AM Patient location: Pre-op. Preanesthetic checklist: patient identified, IV checked, site marked, risks and benefits discussed, surgical consent, monitors and equipment checked, pre-op evaluation, timeout performed and anesthesia consent Position: Trendelenburg Lidocaine 1% used for infiltration and patient sedated Hand hygiene performed , maximum sterile barriers used  and Seldinger technique used Catheter size: 8 Fr Total catheter length 16. Central line was placed.Double lumen Procedure performed using ultrasound guided technique. Ultrasound Notes:anatomy identified, needle tip was noted to be adjacent to the nerve/plexus identified, no ultrasound evidence of intravascular and/or intraneural injection and image(s) printed for medical record Attempts: 1 Following insertion, dressing applied, line sutured and Biopatch. Post procedure assessment: blood return through all ports  Patient tolerated the procedure well with no immediate complications.

## 2018-11-08 NOTE — Progress Notes (Addendum)
Patient very anxious,bp 188/86 HR 69 pacing around in the room, he stated that ''I want to pee but I cant", c/o of nausea, xannax and zofran PRN given, patient told to take some deep breaths,try to relax and remain in the bed for a while to help prevent falls, emotional support given to the patient, will continue to monitor.

## 2018-11-08 NOTE — Interval H&P Note (Signed)
History and Physical Interval Note:  11/08/2018 7:04 AM  Joshua Hess  has presented today for surgery, with the diagnosis of Severe Aortic Stenosis.  The various methods of treatment have been discussed with the patient and family. After consideration of risks, benefits and other options for treatment, the patient has consented to  Procedure(s): TRANSCATHETER AORTIC VALVE REPLACEMENT, TRANSFEMORAL (N/A) TRANSESOPHAGEAL ECHOCARDIOGRAM (TEE) (N/A) as a surgical intervention.  The patient's history has been reviewed, patient examined, no change in status, stable for surgery.  I have reviewed the patient's chart and labs.  Questions were answered to the patient's satisfaction.     Gaye Pollack

## 2018-11-08 NOTE — Progress Notes (Signed)
Patient arrived on the unit from PACU placed on tele ccmd notified, patient assessment completed see flowsheet, patient oriented to room and staff, bed in lowest position, call bell within reach will continue to monitor.

## 2018-11-08 NOTE — Progress Notes (Signed)
Patient ambulated multiple times in the hall on this shift, ambulation well tolerated.Patient verbalized that he feels better now, will continue to monitor.

## 2018-11-08 NOTE — Anesthesia Procedure Notes (Signed)
Procedure Name: Intubation Date/Time: 11/08/2018 8:04 AM Performed by: Barrington Ellison, CRNA Pre-anesthesia Checklist: Patient identified, Emergency Drugs available, Suction available and Patient being monitored Patient Re-evaluated:Patient Re-evaluated prior to induction Oxygen Delivery Method: Circle System Utilized Preoxygenation: Pre-oxygenation with 100% oxygen Induction Type: IV induction Ventilation: Mask ventilation without difficulty Laryngoscope Size: Mac and 4 Grade View: Grade I Tube type: Oral Tube size: 7.5 mm Number of attempts: 1 Airway Equipment and Method: Stylet and Oral airway Placement Confirmation: ETT inserted through vocal cords under direct vision,  positive ETCO2 and breath sounds checked- equal and bilateral Secured at: 22 cm Tube secured with: Tape Dental Injury: Teeth and Oropharynx as per pre-operative assessment

## 2018-11-08 NOTE — Anesthesia Postprocedure Evaluation (Signed)
Anesthesia Post Note  Patient: Joshua Hess  Procedure(s) Performed: TRANSCATHETER AORTIC VALVE REPLACEMENT, TRANSFEMORAL (N/A Chest) TRANSESOPHAGEAL ECHOCARDIOGRAM (TEE) (N/A ) Cancelled Procedure     Patient location during evaluation: PACU Anesthesia Type: General Level of consciousness: awake and alert Pain management: pain level controlled Vital Signs Assessment: post-procedure vital signs reviewed and stable Respiratory status: spontaneous breathing, nonlabored ventilation, respiratory function stable and patient connected to nasal cannula oxygen Cardiovascular status: blood pressure returned to baseline and stable Postop Assessment: no apparent nausea or vomiting Anesthetic complications: no    Last Vitals:  Vitals:   11/08/18 0905 11/08/18 0920  BP: (!) 147/65 (!) 154/69  Pulse: (!) 49 (!) 49  Resp: 19 10  Temp:    SpO2: 98% 100%    Last Pain:  Vitals:   11/08/18 0920  TempSrc:   PainSc: 0-No pain                 Nila Winker S

## 2018-11-08 NOTE — Progress Notes (Signed)
Transesophageal echocardiogram  has been performed 

## 2018-11-09 ENCOUNTER — Inpatient Hospital Stay (HOSPITAL_COMMUNITY): Payer: Medicare HMO

## 2018-11-09 ENCOUNTER — Other Ambulatory Visit: Payer: Self-pay | Admitting: *Deleted

## 2018-11-09 ENCOUNTER — Encounter (HOSPITAL_COMMUNITY): Payer: Self-pay | Admitting: General Practice

## 2018-11-09 DIAGNOSIS — I350 Nonrheumatic aortic (valve) stenosis: Principal | ICD-10-CM

## 2018-11-09 DIAGNOSIS — Z01810 Encounter for preprocedural cardiovascular examination: Secondary | ICD-10-CM

## 2018-11-09 LAB — BASIC METABOLIC PANEL
Anion gap: 6 (ref 5–15)
BUN: 21 mg/dL (ref 8–23)
CO2: 28 mmol/L (ref 22–32)
Calcium: 8.5 mg/dL — ABNORMAL LOW (ref 8.9–10.3)
Chloride: 105 mmol/L (ref 98–111)
Creatinine, Ser: 1.03 mg/dL (ref 0.61–1.24)
GFR calc Af Amer: >60 mL/min (ref >60)
GFR calc non Af Amer: >60 mL/min (ref >60)
Glucose, Bld: 90 mg/dL (ref 70–99)
Potassium: 3.9 mmol/L (ref 3.5–5.1)
Sodium: 139 mmol/L (ref 135–145)

## 2018-11-09 LAB — TYPE AND SCREEN
ABO/RH(D): O NEG
ABO/RH(D): O NEG
Antibody Screen: NEGATIVE
Antibody Screen: NEGATIVE

## 2018-11-09 MED ORDER — PLASMA-LYTE 148 IV SOLN
INTRAVENOUS | Status: DC
  Filled 2018-11-09: qty 2.5

## 2018-11-09 MED ORDER — CHLORHEXIDINE GLUCONATE CLOTH 2 % EX PADS: 6.0000 | MEDICATED_PAD | Freq: Once | CUTANEOUS | Status: DC

## 2018-11-09 MED ORDER — SODIUM CHLORIDE 0.9 % IV SOLN
INTRAVENOUS | Status: DC
  Filled 2018-11-09: qty 30

## 2018-11-09 MED ORDER — TEMAZEPAM 7.5 MG PO CAPS: 15.0000 mg | ORAL_CAPSULE | Freq: Once | ORAL | Status: DC | PRN

## 2018-11-09 MED ORDER — SODIUM CHLORIDE 0.9 % IV SOLN
750.0000 mg | INTRAVENOUS | Status: DC
  Filled 2018-11-09: qty 750

## 2018-11-09 MED ORDER — BISACODYL 5 MG PO TBEC: 5.0000 mg | DELAYED_RELEASE_TABLET | Freq: Once | ORAL | Status: DC

## 2018-11-09 MED ORDER — TRANEXAMIC ACID (OHS) PUMP PRIME SOLUTION
2.0000 mg/kg | INTRAVENOUS | Status: DC
  Filled 2018-11-09: qty 1.52

## 2018-11-09 MED ORDER — CHLORHEXIDINE GLUCONATE 0.12 % MT SOLN
15.0000 mL | Freq: Once | OROMUCOSAL | Status: AC
  Administered 2018-11-10: 15 mL via OROMUCOSAL
  Filled 2018-11-09: qty 15

## 2018-11-09 MED ORDER — INSULIN REGULAR(HUMAN) IN NACL 100-0.9 UT/100ML-% IV SOLN
INTRAVENOUS | Status: DC
  Filled 2018-11-09: qty 100

## 2018-11-09 MED ORDER — VANCOMYCIN HCL 10 G IV SOLR
1250.0000 mg | INTRAVENOUS | Status: AC
  Administered 2018-11-10: 1250 mg via INTRAVENOUS
  Filled 2018-11-09: qty 1250

## 2018-11-09 MED ORDER — TRANEXAMIC ACID (OHS) BOLUS VIA INFUSION
15.0000 mg/kg | INTRAVENOUS | Status: AC
  Administered 2018-11-10: 1155 mg via INTRAVENOUS
  Filled 2018-11-09: qty 1140

## 2018-11-09 MED ORDER — DIAZEPAM 5 MG PO TABS
5.0000 mg | ORAL_TABLET | Freq: Once | ORAL | Status: AC
  Administered 2018-11-10: 5 mg via ORAL
  Filled 2018-11-09: qty 1

## 2018-11-09 MED ORDER — DEXMEDETOMIDINE HCL IN NACL 400 MCG/100ML IV SOLN
0.1000 ug/kg/h | INTRAVENOUS | Status: DC
  Filled 2018-11-09: qty 100

## 2018-11-09 MED ORDER — METOPROLOL TARTRATE 12.5 MG HALF TABLET
12.5000 mg | ORAL_TABLET | Freq: Once | ORAL | Status: DC
  Filled 2018-11-09: qty 1

## 2018-11-09 MED ORDER — NOREPINEPHRINE 4 MG/250ML-% IV SOLN
0.0000 ug/min | INTRAVENOUS | Status: DC
  Filled 2018-11-09: qty 250

## 2018-11-09 MED ORDER — DOPAMINE-DEXTROSE 3.2-5 MG/ML-% IV SOLN
0.0000 ug/kg/min | INTRAVENOUS | Status: DC
  Filled 2018-11-09: qty 250

## 2018-11-09 MED ORDER — MAGNESIUM SULFATE 50 % IJ SOLN
40.0000 meq | INTRAMUSCULAR | Status: DC
  Filled 2018-11-09: qty 9.85

## 2018-11-09 MED ORDER — TRANEXAMIC ACID 1000 MG/10ML IV SOLN
1.5000 mg/kg/h | INTRAVENOUS | Status: DC
  Filled 2018-11-09: qty 25

## 2018-11-09 MED ORDER — MILRINONE LACTATE IN DEXTROSE 20-5 MG/100ML-% IV SOLN
0.3000 ug/kg/min | INTRAVENOUS | Status: DC
  Filled 2018-11-09: qty 100

## 2018-11-09 MED ORDER — EPINEPHRINE PF 1 MG/ML IJ SOLN
0.0000 ug/min | INTRAVENOUS | Status: DC
  Filled 2018-11-09: qty 4

## 2018-11-09 MED ORDER — PHENYLEPHRINE HCL-NACL 20-0.9 MG/250ML-% IV SOLN
30.0000 ug/min | INTRAVENOUS | Status: DC
  Filled 2018-11-09: qty 250

## 2018-11-09 MED ORDER — CHLORHEXIDINE GLUCONATE CLOTH 2 % EX PADS
6.0000 | MEDICATED_PAD | Freq: Once | CUTANEOUS | Status: AC
  Administered 2018-11-09: 6 via TOPICAL

## 2018-11-09 MED ORDER — SODIUM CHLORIDE 0.9 % IV SOLN
1.5000 g | INTRAVENOUS | Status: AC
  Administered 2018-11-10: .75 g via INTRAVENOUS
  Administered 2018-11-10: 1.5 g via INTRAVENOUS
  Filled 2018-11-09: qty 1.5

## 2018-11-09 MED ORDER — POTASSIUM CHLORIDE 2 MEQ/ML IV SOLN
80.0000 meq | INTRAVENOUS | Status: DC
  Filled 2018-11-09: qty 40

## 2018-11-09 MED ORDER — BISACODYL 5 MG PO TBEC
5.0000 mg | DELAYED_RELEASE_TABLET | Freq: Every day | ORAL | Status: DC | PRN
  Administered 2018-11-09: 5 mg via ORAL
  Filled 2018-11-09: qty 1

## 2018-11-09 MED ORDER — NITROGLYCERIN IN D5W 200-5 MCG/ML-% IV SOLN
2.0000 ug/min | INTRAVENOUS | Status: AC
  Administered 2018-11-10: 10 ug/min via INTRAVENOUS
  Filled 2018-11-09: qty 250

## 2018-11-09 MED FILL — Magnesium Sulfate Inj 50%: INTRAMUSCULAR | Qty: 10 | Status: AC

## 2018-11-09 MED FILL — Heparin Sodium (Porcine) Inj 1000 Unit/ML: INTRAMUSCULAR | Qty: 30 | Status: AC

## 2018-11-09 MED FILL — Potassium Chloride Inj 2 mEq/ML: INTRAVENOUS | Qty: 40 | Status: AC

## 2018-11-09 NOTE — Progress Notes (Signed)
Cardiac Rehab Advisory Cardiac Rehab Phase I is not seeing pts face to face at this time due to Covid 19 restrictions. Ambulation is occurring through nursing, PT, and mobility teams. We will help facilitate that process as needed. We are calling pts in their rooms and discussing education. We will then deliver education materials to pts RN for delivery to pt.   [id number] Called pt to discuss importance of mobility and IS after surgery. RN to give IS and instruct in use. Pt will have someone available after discharge to assist with care. Will give [facility] booklet, care guide and in the tube handout. Discussed sternal precautions and staying in the tube. Pt has been walking in the halls and is very active normally. Discussed how to view pre op video. Pt voiced understanding of ed. Will follow up after surgery. Graylon Good RN BSN 11/09/2018 9:05 AM

## 2018-11-09 NOTE — Anesthesia Preprocedure Evaluation (Addendum)
Anesthesia Evaluation  Patient identified by MRN, date of birth, ID band Patient awake    Reviewed: Allergy & Precautions, NPO status , Patient's Chart, lab work & pertinent test results  Airway Mallampati: I  TM Distance: >3 FB Neck ROM: Full    Dental no notable dental hx.    Pulmonary former smoker,    Pulmonary exam normal breath sounds clear to auscultation       Cardiovascular + Valvular Problems/Murmurs AS  Rhythm:Regular Rate:Normal + Systolic murmurs ECG: SR, rate 59  CATH: Prox RCA to Mid RCA lesion is 20% stenosed. Mid RCA lesion is 30% stenosed. Ost Cx to Prox Cx lesion is 30% stenosed. Prox LAD lesion is 10% stenosed. Mid LAD lesion is 20% stenosed. Mid LAD to Dist LAD lesion is 20% stenosed.  1. Mild non-obstructive CAD 2. Severe aortic stenosis by echo (by cath mean gradient 17. 29mmHg and peak to peak gradient 24 mmHg)  ECHO:  1. The left ventricle appears to be mildly increased in size, have normal wall thickness, with 60-65%. Echo evidence of impaired relaxation in diastolic filling patterns.  2. Right ventricular systolic pressure is is moderately elevated.  3. The right ventricle is normal in size, has normal wall thickness and normal systolic function.  4. Normal left atrial size.  5. Normal right atrial size.  6. The mitral valve is degenerative.  7. Normal tricuspid valve.  8. Aortic valve regurgitation is moderate by color flow Doppler.  9. Aortic valve tricuspid. 10. Severe stenosis of the aortic valve. 11. There is moderate sclerosis of the aortic valve. 12. There is severe thickening of the aortic valve. 13. No atrial level shunt detected by color flow Doppler.   Neuro/Psych negative neurological ROS  negative psych ROS   GI/Hepatic negative GI ROS, Neg liver ROS,   Endo/Other  negative endocrine ROS  Renal/GU negative Renal ROS     Musculoskeletal negative musculoskeletal ROS (+)   Abdominal   Peds  Hematology negative hematology ROS (+)   Anesthesia Other Findings SEVERE AS  Reproductive/Obstetrics                           Anesthesia Physical Anesthesia Plan  ASA: IV  Anesthesia Plan: General   Post-op Pain Management:    Induction: Intravenous  PONV Risk Score and Plan: 2 and Midazolam, Ondansetron, Dexamethasone and Treatment may vary due to age or medical condition  Airway Management Planned: Oral ETT  Additional Equipment: Arterial line, TEE and CVP  Intra-op Plan:   Post-operative Plan: Post-operative intubation/ventilation  Informed Consent: I have reviewed the patients History and Physical, chart, labs and discussed the procedure including the risks, benefits and alternatives for the proposed anesthesia with the patient or authorized representative who has indicated his/her understanding and acceptance.     Dental advisory given  Plan Discussed with: CRNA  Anesthesia Plan Comments:       Anesthesia Quick Evaluation

## 2018-11-09 NOTE — Progress Notes (Addendum)
      [facility]       [place] [address]             [phone]      1 Day Post-Op Procedure(s) (LRB): TRANSCATHETER AORTIC VALVE REPLACEMENT, TRANSFEMORAL (N/A) TRANSESOPHAGEAL ECHOCARDIOGRAM (TEE) (N/A) Cancelled Procedure Subjective: No new c/o  Objective: Vital signs in last 24 hours: Temp:  [97.3 F (36.3 C)-97.8 F (36.6 C)] 97.8 F (36.6 C) (04/29 0641) Pulse Rate:  [45-69] 53 (04/29 0641) Cardiac Rhythm: Sinus bradycardia (04/29 0240) Resp:  [10-19] 18 (04/29 0641) BP: (132-188)/(62-86) 132/65 (04/29 0641) SpO2:  [97 %-100 %] 97 % (04/29 0641) Arterial Line BP: (151-156)/(61-63) 155/61 (04/28 0920) Weight:  [76 kg-77.6 kg] 76 kg (04/29 0641)  Hemodynamic parameters for last 24 hours:    Intake/Output from previous day: 04/28 0701 - 04/29 0700 In: 800 [I.V.:800] Out: 200 [Urine:200] Intake/Output this shift: No intake/output data recorded.  General appearance: alert, cooperative and no distress Heart: regular rate and rhythm and 3/6 Aortic systolic murmur Lungs: clear to auscultation bilaterally Abdomen: benign Extremities: no edema  Lab Results: No results for input(s): WBC, HGB, HCT, PLT in the last 72 hours. BMET:  Recent Labs    11/09/18 0359  NA 139  K 3.9  CL 105  CO2 28  GLUCOSE 90  BUN 21  CREATININE 1.03  CALCIUM 8.5*    PT/INR: No results for input(s): LABPROT, INR in the last 72 hours. ABG    Component Value Date/Time   PHART 7.452 (H) 11/04/2018 1031   HCO3 26.8 11/04/2018 1031   TCO2 27 09/29/2018 0824   O2SAT 97.3 11/04/2018 1031   CBG (last 3)  No results for input(s): GLUCAP in the last 72 hours.  Meds Scheduled Meds: . sodium chloride flush  3 mL Intravenous Q12H   Continuous Infusions: . sodium chloride     PRN Meds:.sodium chloride, acetaminophen, ALPRAZolam, ondansetron (ZOFRAN) IV, sodium chloride flush, traZODone  Xrays X-ray Chest Pa Or Ap  Result Date: 11/08/2018 CLINICAL DATA:   78 year old male with central line placement EXAM: CHEST  1 VIEW COMPARISON:  11/04/2018 FINDINGS: Cardiomediastinal silhouette unchanged in size and contour. No evidence of central vascular congestion. No pneumothorax or pleural effusion. Interval right IJ central venous line with the tip appearing to terminate superior vena cava. Ill-defined linear opacities at the left lung base. IMPRESSION: Interval placement of right IJ central venous catheter with no complicating features. Low lung volumes with possible left basilar atelectasis/consolidation. Electronically Signed   By: Corrie Mckusick D.O.   On: 11/08/2018 10:20    Assessment/Plan: S/P Procedure(s) (LRB): TRANSCATHETER AORTIC VALVE REPLACEMENT, TRANSFEMORAL (N/A) TRANSESOPHAGEAL ECHOCARDIOGRAM (TEE) (N/A) Cancelled Procedure  1 hemodyn stable with HTN, will not change meds at this time with pending surgery tomorrow and severe AS 2 sinus rhythm/brady with PAC's- monitor, not unusual for bradycardia in such an avid runner 3 Renal fxn stable  4 plans for AVR tomorrow  LOS: 1 day    Osmond Giovanni [facility] 11/09/2018 Pager [phone]   Chart reviewed, patient examined, agree with above. Discussed AVR procedure with use of bovine pericardial valve. I discussed alternatives, benefits and risks; including but not limited to bleeding, blood transfusion, infection, stroke, myocardial infarction, graft failure, heart block requiring a permanent pacemaker, organ dysfunction, and death.  Ronnald Collum understands and agrees to proceed.

## 2018-11-09 NOTE — Progress Notes (Signed)
Pre CABG vascular ultrasound evaluation has been completed. Preliminary results can be found under CV proc through chart review. June Leap, BS, RDMS, RVT

## 2018-11-10 ENCOUNTER — Inpatient Hospital Stay (HOSPITAL_COMMUNITY): Payer: Medicare HMO

## 2018-11-10 ENCOUNTER — Encounter (HOSPITAL_COMMUNITY)
Admission: RE | Disposition: A | Discharge status: Home / Self Care | Payer: Self-pay | Source: Home / Self Care | Attending: Surgery

## 2018-11-10 ENCOUNTER — Inpatient Hospital Stay (HOSPITAL_COMMUNITY): Payer: Medicare HMO | Admitting: Anesthesiology

## 2018-11-10 DIAGNOSIS — I350 Nonrheumatic aortic (valve) stenosis: Secondary | ICD-10-CM

## 2018-11-10 HISTORY — PX: AORTIC VALVE REPLACEMENT: SHX41

## 2018-11-10 HISTORY — PX: TEE WITHOUT CARDIOVERSION: SHX5443

## 2018-11-10 LAB — BASIC METABOLIC PANEL
Anion gap: 8 (ref 5–15)
Anion gap: 10 (ref 5–15)
BUN: 24 mg/dL — ABNORMAL HIGH (ref 8–23)
BUN: 23 mg/dL (ref 8–23)
CO2: 28 mmol/L (ref 22–32)
CO2: 22 mmol/L (ref 22–32)
Calcium: 9.3 mg/dL (ref 8.9–10.3)
Calcium: 7.7 mg/dL — ABNORMAL LOW (ref 8.9–10.3)
Chloride: 103 mmol/L (ref 98–111)
Chloride: 106 mmol/L (ref 98–111)
Creatinine, Ser: 0.97 mg/dL (ref 0.61–1.24)
Creatinine, Ser: 0.95 mg/dL (ref 0.61–1.24)
GFR calc Af Amer: >60 mL/min (ref >60)
GFR calc Af Amer: >60 mL/min (ref >60)
GFR calc non Af Amer: >60 mL/min (ref >60)
GFR calc non Af Amer: >60 mL/min (ref >60)
Glucose, Bld: 92 mg/dL (ref 70–99)
Glucose, Bld: 146 mg/dL — ABNORMAL HIGH (ref 70–99)
Potassium: 4.2 mmol/L (ref 3.5–5.1)
Potassium: 3.9 mmol/L (ref 3.5–5.1)
Sodium: 139 mmol/L (ref 135–145)
Sodium: 138 mmol/L (ref 135–145)

## 2018-11-10 LAB — POCT I-STAT 7, (LYTES, BLD GAS, ICA,H+H)
Acid-Base Excess: 1 mmol/L (ref 0.0–2.0)
Acid-Base Excess: 3 mmol/L — ABNORMAL HIGH (ref 0.0–2.0)
Acid-Base Excess: 2 mmol/L (ref 0.0–2.0)
Acid-base deficit: 1 mmol/L (ref 0.0–2.0)
Bicarbonate: 26.6 mmol/L (ref 20.0–28.0)
Bicarbonate: 28.7 mmol/L — ABNORMAL HIGH (ref 20.0–28.0)
Bicarbonate: 24.2 mmol/L (ref 20.0–28.0)
Bicarbonate: 25 mmol/L (ref 20.0–28.0)
Calcium, Ion: 1.1 mmol/L — ABNORMAL LOW (ref 1.15–1.40)
Calcium, Ion: 1.08 mmol/L — ABNORMAL LOW (ref 1.15–1.40)
Calcium, Ion: 1.09 mmol/L — ABNORMAL LOW (ref 1.15–1.40)
Calcium, Ion: 1.12 mmol/L — ABNORMAL LOW (ref 1.15–1.40)
HCT: 30 % — ABNORMAL LOW (ref 39.0–52.0)
HCT: 35 % — ABNORMAL LOW (ref 39.0–52.0)
HCT: 35 % — ABNORMAL LOW (ref 39.0–52.0)
HCT: 35 % — ABNORMAL LOW (ref 39.0–52.0)
Hemoglobin: 10.2 g/dL — ABNORMAL LOW (ref 13.0–17.0)
Hemoglobin: 11.9 g/dL — ABNORMAL LOW (ref 13.0–17.0)
Hemoglobin: 11.9 g/dL — ABNORMAL LOW (ref 13.0–17.0)
Hemoglobin: 11.9 g/dL — ABNORMAL LOW (ref 13.0–17.0)
O2 Saturation: 98 %
O2 Saturation: 99 %
O2 Saturation: 99 %
O2 Saturation: 99 %
Patient temperature: 37.3
Patient temperature: 37.2
Patient temperature: 37
Potassium: 4.5 mmol/L (ref 3.5–5.1)
Potassium: 5.4 mmol/L — ABNORMAL HIGH (ref 3.5–5.1)
Potassium: 4.1 mmol/L (ref 3.5–5.1)
Potassium: 4.1 mmol/L (ref 3.5–5.1)
Sodium: 139 mmol/L (ref 135–145)
Sodium: 139 mmol/L (ref 135–145)
Sodium: 139 mmol/L (ref 135–145)
Sodium: 139 mmol/L (ref 135–145)
TCO2: 28 mmol/L (ref 22–32)
TCO2: 30 mmol/L (ref 22–32)
TCO2: 25 mmol/L (ref 22–32)
TCO2: 26 mmol/L (ref 22–32)
pCO2 arterial: 44.1 mmHg (ref 32.0–48.0)
pCO2 arterial: 46.2 mmHg (ref 32.0–48.0)
pCO2 arterial: 30.4 mmHg — ABNORMAL LOW (ref 32.0–48.0)
pCO2 arterial: 46.7 mmHg (ref 32.0–48.0)
pH, Arterial: 7.39 (ref 7.350–7.450)
pH, Arterial: 7.404 (ref 7.350–7.450)
pH, Arterial: 7.51 — ABNORMAL HIGH (ref 7.350–7.450)
pH, Arterial: 7.336 — ABNORMAL LOW (ref 7.350–7.450)
pO2, Arterial: 107 mmHg (ref 83.0–108.0)
pO2, Arterial: 122 mmHg — ABNORMAL HIGH (ref 83.0–108.0)
pO2, Arterial: 144 mmHg — ABNORMAL HIGH (ref 83.0–108.0)
pO2, Arterial: 131 mmHg — ABNORMAL HIGH (ref 83.0–108.0)

## 2018-11-10 LAB — POCT I-STAT 4, (NA,K, GLUC, HGB,HCT)
Glucose, Bld: 103 mg/dL — ABNORMAL HIGH (ref 70–99)
Glucose, Bld: 100 mg/dL — ABNORMAL HIGH (ref 70–99)
Glucose, Bld: 102 mg/dL — ABNORMAL HIGH (ref 70–99)
Glucose, Bld: 145 mg/dL — ABNORMAL HIGH (ref 70–99)
Glucose, Bld: 125 mg/dL — ABNORMAL HIGH (ref 70–99)
Glucose, Bld: 98 mg/dL (ref 70–99)
HCT: 36 % — ABNORMAL LOW (ref 39.0–52.0)
HCT: 29 % — ABNORMAL LOW (ref 39.0–52.0)
HCT: 33 % — ABNORMAL LOW (ref 39.0–52.0)
HCT: 28 % — ABNORMAL LOW (ref 39.0–52.0)
HCT: 32 % — ABNORMAL LOW (ref 39.0–52.0)
HCT: 33 % — ABNORMAL LOW (ref 39.0–52.0)
Hemoglobin: 12.2 g/dL — ABNORMAL LOW (ref 13.0–17.0)
Hemoglobin: 11.2 g/dL — ABNORMAL LOW (ref 13.0–17.0)
Hemoglobin: 9.9 g/dL — ABNORMAL LOW (ref 13.0–17.0)
Hemoglobin: 9.5 g/dL — ABNORMAL LOW (ref 13.0–17.0)
Hemoglobin: 10.9 g/dL — ABNORMAL LOW (ref 13.0–17.0)
Hemoglobin: 11.2 g/dL — ABNORMAL LOW (ref 13.0–17.0)
Potassium: 3.9 mmol/L (ref 3.5–5.1)
Potassium: 3.8 mmol/L (ref 3.5–5.1)
Potassium: 4 mmol/L (ref 3.5–5.1)
Potassium: 4.9 mmol/L (ref 3.5–5.1)
Potassium: 4.4 mmol/L (ref 3.5–5.1)
Potassium: 4 mmol/L (ref 3.5–5.1)
Sodium: 138 mmol/L (ref 135–145)
Sodium: 138 mmol/L (ref 135–145)
Sodium: 140 mmol/L (ref 135–145)
Sodium: 137 mmol/L (ref 135–145)
Sodium: 139 mmol/L (ref 135–145)
Sodium: 140 mmol/L (ref 135–145)

## 2018-11-10 LAB — PROTIME-INR
INR: 1.5 — ABNORMAL HIGH (ref 0.8–1.2)
Prothrombin Time: 17.8 seconds — ABNORMAL HIGH (ref 11.4–15.2)

## 2018-11-10 LAB — CBC
HCT: 42.6 % (ref 39.0–52.0)
HCT: 36.4 % — ABNORMAL LOW (ref 39.0–52.0)
HCT: 37.5 % — ABNORMAL LOW (ref 39.0–52.0)
Hemoglobin: 14.9 g/dL (ref 13.0–17.0)
Hemoglobin: 12.8 g/dL — ABNORMAL LOW (ref 13.0–17.0)
Hemoglobin: 13.2 g/dL (ref 13.0–17.0)
MCH: 33.6 pg (ref 26.0–34.0)
MCH: 33.7 pg (ref 26.0–34.0)
MCH: 33.8 pg (ref 26.0–34.0)
MCHC: 35 g/dL (ref 30.0–36.0)
MCHC: 35.2 g/dL (ref 30.0–36.0)
MCHC: 35.2 g/dL (ref 30.0–36.0)
MCV: 95.9 fL (ref 80.0–100.0)
MCV: 95.8 fL (ref 80.0–100.0)
MCV: 95.9 fL (ref 80.0–100.0)
Platelets: 179 10*3/uL (ref 150–400)
Platelets: 117 10*3/uL — ABNORMAL LOW (ref 150–400)
Platelets: 117 10*3/uL — ABNORMAL LOW (ref 150–400)
RBC: 4.44 MIL/uL (ref 4.22–5.81)
RBC: 3.8 MIL/uL — ABNORMAL LOW (ref 4.22–5.81)
RBC: 3.91 MIL/uL — ABNORMAL LOW (ref 4.22–5.81)
RDW: 11.6 % (ref 11.5–15.5)
RDW: 11.5 % (ref 11.5–15.5)
RDW: 11.7 % (ref 11.5–15.5)
WBC: 8 10*3/uL (ref 4.0–10.5)
WBC: 12.1 10*3/uL — ABNORMAL HIGH (ref 4.0–10.5)
WBC: 11.9 10*3/uL — ABNORMAL HIGH (ref 4.0–10.5)
nRBC: 0 % (ref 0.0–0.2)
nRBC: 0 % (ref 0.0–0.2)
nRBC: 0 % (ref 0.0–0.2)

## 2018-11-10 LAB — PLATELET COUNT: Platelets: 122 10*3/uL — ABNORMAL LOW (ref 150–400)

## 2018-11-10 LAB — ECHO INTRAOPERATIVE TEE
Height: 69 in
Weight: 2716.8 oz

## 2018-11-10 LAB — GLUCOSE, CAPILLARY
Glucose-Capillary: 106 mg/dL — ABNORMAL HIGH (ref 70–99)
Glucose-Capillary: 89 mg/dL (ref 70–99)
Glucose-Capillary: 100 mg/dL — ABNORMAL HIGH (ref 70–99)
Glucose-Capillary: 121 mg/dL — ABNORMAL HIGH (ref 70–99)
Glucose-Capillary: 130 mg/dL — ABNORMAL HIGH (ref 70–99)

## 2018-11-10 LAB — APTT: aPTT: 35 seconds (ref 24–36)

## 2018-11-10 LAB — HEMOGLOBIN AND HEMATOCRIT, BLOOD
HCT: 29 % — ABNORMAL LOW (ref 39.0–52.0)
Hemoglobin: 10.2 g/dL — ABNORMAL LOW (ref 13.0–17.0)

## 2018-11-10 LAB — MAGNESIUM: Magnesium: 2.9 mg/dL — ABNORMAL HIGH (ref 1.7–2.4)

## 2018-11-10 SURGERY — REPLACEMENT, AORTIC VALVE, OPEN
Anesthesia: General | Site: Chest

## 2018-11-10 MED ORDER — DEXMEDETOMIDINE HCL IN NACL 400 MCG/100ML IV SOLN
INTRAVENOUS | Status: DC | PRN
  Administered 2018-11-10: .5 ug/kg/h via INTRAVENOUS

## 2018-11-10 MED ORDER — MIDAZOLAM HCL 5 MG/5ML IJ SOLN
INTRAMUSCULAR | Status: DC | PRN
  Administered 2018-11-10: 5 mg via INTRAVENOUS

## 2018-11-10 MED ORDER — METOPROLOL TARTRATE 5 MG/5ML IV SOLN: 2.5000 mg | INTRAVENOUS | Status: DC | PRN

## 2018-11-10 MED ORDER — PHENYLEPHRINE 40 MCG/ML (10ML) SYRINGE FOR IV PUSH (FOR BLOOD PRESSURE SUPPORT)
PREFILLED_SYRINGE | INTRAVENOUS | Status: AC
  Filled 2018-11-10: qty 10

## 2018-11-10 MED ORDER — ACETAMINOPHEN 160 MG/5ML PO SOLN: 1000.0000 mg | Freq: Four times a day (QID) | ORAL | Status: DC

## 2018-11-10 MED ORDER — PROPOFOL 10 MG/ML IV BOLUS
INTRAVENOUS | Status: DC | PRN
  Administered 2018-11-10: 40 mg via INTRAVENOUS
  Administered 2018-11-10 (×2): 50 mg via INTRAVENOUS
  Administered 2018-11-10: 60 mg via INTRAVENOUS
  Administered 2018-11-10: 20 mg via INTRAVENOUS

## 2018-11-10 MED ORDER — VANCOMYCIN HCL IN DEXTROSE 1-5 GM/200ML-% IV SOLN
1000.0000 mg | Freq: Once | INTRAVENOUS | Status: AC
  Administered 2018-11-10: 1000 mg via INTRAVENOUS
  Filled 2018-11-10: qty 200

## 2018-11-10 MED ORDER — LACTATED RINGERS IV SOLN
INTRAVENOUS | Status: DC | PRN
  Administered 2018-11-10: 07:00:00 via INTRAVENOUS

## 2018-11-10 MED ORDER — PROPOFOL 10 MG/ML IV BOLUS
INTRAVENOUS | Status: AC
  Filled 2018-11-10: qty 20

## 2018-11-10 MED ORDER — FAMOTIDINE IN NACL 20-0.9 MG/50ML-% IV SOLN
20.0000 mg | Freq: Two times a day (BID) | INTRAVENOUS | Status: DC
  Administered 2018-11-10: 20 mg via INTRAVENOUS
  Filled 2018-11-10 (×2): qty 50

## 2018-11-10 MED ORDER — DEXMEDETOMIDINE HCL IN NACL 200 MCG/50ML IV SOLN
0.0000 ug/kg/h | INTRAVENOUS | Status: DC
  Administered 2018-11-10: 0.7 ug/kg/h via INTRAVENOUS

## 2018-11-10 MED ORDER — DEXAMETHASONE SODIUM PHOSPHATE 10 MG/ML IJ SOLN
INTRAMUSCULAR | Status: AC
  Filled 2018-11-10: qty 1

## 2018-11-10 MED ORDER — HEPARIN SODIUM (PORCINE) 1000 UNIT/ML IJ SOLN
INTRAMUSCULAR | Status: DC | PRN
  Administered 2018-11-10: 27000 [IU] via INTRAVENOUS

## 2018-11-10 MED ORDER — ROCURONIUM BROMIDE 10 MG/ML (PF) SYRINGE
PREFILLED_SYRINGE | INTRAVENOUS | Status: DC | PRN
  Administered 2018-11-10: 40 mg via INTRAVENOUS
  Administered 2018-11-10: 20 mg via INTRAVENOUS
  Administered 2018-11-10 (×2): 50 mg via INTRAVENOUS
  Administered 2018-11-10: 60 mg via INTRAVENOUS
  Administered 2018-11-10: 10 mg via INTRAVENOUS

## 2018-11-10 MED ORDER — SODIUM CHLORIDE 0.9 % IV SOLN
INTRAVENOUS | Status: DC | PRN
  Administered 2018-11-10: .5 [IU]/h via INTRAVENOUS

## 2018-11-10 MED ORDER — MAGNESIUM SULFATE 4 GM/100ML IV SOLN
4.0000 g | Freq: Once | INTRAVENOUS | Status: AC
  Administered 2018-11-10: 4 g via INTRAVENOUS
  Filled 2018-11-10: qty 100

## 2018-11-10 MED ORDER — PANTOPRAZOLE SODIUM 40 MG PO TBEC
40.0000 mg | DELAYED_RELEASE_TABLET | Freq: Every day | ORAL | Status: DC
  Administered 2018-11-12: 40 mg via ORAL
  Filled 2018-11-10: qty 1

## 2018-11-10 MED ORDER — ALBUMIN HUMAN 5 % IV SOLN: 250.0000 mL | INTRAVENOUS | Status: DC | PRN

## 2018-11-10 MED ORDER — PROTAMINE SULFATE 10 MG/ML IV SOLN
INTRAVENOUS | Status: AC
  Filled 2018-11-10: qty 5

## 2018-11-10 MED ORDER — ASPIRIN EC 325 MG PO TBEC
325.0000 mg | DELAYED_RELEASE_TABLET | Freq: Every day | ORAL | Status: DC
  Administered 2018-11-11 – 2018-11-12 (×2): 325 mg via ORAL
  Filled 2018-11-10 (×2): qty 1

## 2018-11-10 MED ORDER — LIDOCAINE 2% (20 MG/ML) 5 ML SYRINGE
INTRAMUSCULAR | Status: DC | PRN
  Administered 2018-11-10: 20 mg via INTRAVENOUS

## 2018-11-10 MED ORDER — ONDANSETRON HCL 4 MG/2ML IJ SOLN
INTRAMUSCULAR | Status: AC
  Filled 2018-11-10: qty 2

## 2018-11-10 MED ORDER — ESMOLOL HCL 100 MG/10ML IV SOLN
INTRAVENOUS | Status: DC | PRN
  Administered 2018-11-10: 20 mg via INTRAVENOUS
  Administered 2018-11-10: 30 mg via INTRAVENOUS
  Administered 2018-11-10: 20 mg via INTRAVENOUS
  Administered 2018-11-10: 30 mg via INTRAVENOUS

## 2018-11-10 MED ORDER — SODIUM CHLORIDE 0.9% FLUSH
3.0000 mL | Freq: Two times a day (BID) | INTRAVENOUS | Status: DC
  Administered 2018-11-11: 3 mL via INTRAVENOUS
  Administered 2018-11-12: 6 mL via INTRAVENOUS
  Administered 2018-11-12: 3 mL via INTRAVENOUS

## 2018-11-10 MED ORDER — ACETAMINOPHEN 160 MG/5ML PO SOLN: 650.0000 mg | Freq: Once | ORAL | Status: AC

## 2018-11-10 MED ORDER — MIDAZOLAM HCL (PF) 10 MG/2ML IJ SOLN
INTRAMUSCULAR | Status: AC
  Filled 2018-11-10: qty 2

## 2018-11-10 MED ORDER — EPHEDRINE SULFATE-NACL 50-0.9 MG/10ML-% IV SOSY
PREFILLED_SYRINGE | INTRAVENOUS | Status: DC | PRN
  Administered 2018-11-10 (×2): 5 mg via INTRAVENOUS

## 2018-11-10 MED ORDER — METOPROLOL TARTRATE 25 MG/10 ML ORAL SUSPENSION: 12.5000 mg | Freq: Two times a day (BID) | ORAL | Status: DC

## 2018-11-10 MED ORDER — ASPIRIN 81 MG PO CHEW: 324.0000 mg | CHEWABLE_TABLET | Freq: Every day | ORAL | Status: DC

## 2018-11-10 MED ORDER — SODIUM CHLORIDE 0.9 % IV SOLN
1.5000 g | Freq: Two times a day (BID) | INTRAVENOUS | Status: AC
  Administered 2018-11-10 – 2018-11-12 (×4): 1.5 g via INTRAVENOUS
  Filled 2018-11-10 (×4): qty 1.5

## 2018-11-10 MED ORDER — LIDOCAINE 2% (20 MG/ML) 5 ML SYRINGE
INTRAMUSCULAR | Status: AC
  Filled 2018-11-10: qty 5

## 2018-11-10 MED ORDER — NITROGLYCERIN IN D5W 200-5 MCG/ML-% IV SOLN: 0.0000 ug/min | INTRAVENOUS | Status: DC

## 2018-11-10 MED ORDER — PROTAMINE SULFATE 10 MG/ML IV SOLN
INTRAVENOUS | Status: DC | PRN
  Administered 2018-11-10: 30 mg via INTRAVENOUS
  Administered 2018-11-10: 240 mg via INTRAVENOUS

## 2018-11-10 MED ORDER — ROCURONIUM BROMIDE 10 MG/ML (PF) SYRINGE
PREFILLED_SYRINGE | INTRAVENOUS | Status: AC
  Filled 2018-11-10: qty 10

## 2018-11-10 MED ORDER — INSULIN REGULAR(HUMAN) IN NACL 100-0.9 UT/100ML-% IV SOLN
INTRAVENOUS | Status: DC
  Administered 2018-11-10: 0.8 [IU]/h via INTRAVENOUS

## 2018-11-10 MED ORDER — THROMBIN 5000 UNITS EX SOLR
CUTANEOUS | Status: AC
  Filled 2018-11-10: qty 5000

## 2018-11-10 MED ORDER — THROMBIN 20000 UNITS EX SOLR
CUTANEOUS | Status: DC | PRN
  Administered 2018-11-10: 20000 [IU] via TOPICAL

## 2018-11-10 MED ORDER — INSULIN ASPART 100 UNIT/ML ~~LOC~~ SOLN
0.0000 [IU] | SUBCUTANEOUS | Status: DC
  Administered 2018-11-10 – 2018-11-11 (×3): 2 [IU] via SUBCUTANEOUS

## 2018-11-10 MED ORDER — ROCURONIUM BROMIDE 10 MG/ML (PF) SYRINGE
PREFILLED_SYRINGE | INTRAVENOUS | Status: AC
  Filled 2018-11-10: qty 20

## 2018-11-10 MED ORDER — 0.9 % SODIUM CHLORIDE (POUR BTL) OPTIME
TOPICAL | Status: DC | PRN
  Administered 2018-11-10: 5000 mL

## 2018-11-10 MED ORDER — DOCUSATE SODIUM 100 MG PO CAPS
200.0000 mg | ORAL_CAPSULE | Freq: Every day | ORAL | Status: DC
  Administered 2018-11-11 – 2018-11-12 (×2): 200 mg via ORAL
  Filled 2018-11-10 (×2): qty 2

## 2018-11-10 MED ORDER — TRANEXAMIC ACID 1000 MG/10ML IV SOLN
INTRAVENOUS | Status: DC | PRN
  Administered 2018-11-10: 1.5 mg/kg/h via INTRAVENOUS

## 2018-11-10 MED ORDER — PHENYLEPHRINE HCL-NACL 20-0.9 MG/250ML-% IV SOLN: 0.0000 ug/min | INTRAVENOUS | Status: DC

## 2018-11-10 MED ORDER — INSULIN REGULAR BOLUS VIA INFUSION
0.0000 [IU] | Freq: Three times a day (TID) | INTRAVENOUS | Status: DC
  Filled 2018-11-10: qty 10

## 2018-11-10 MED ORDER — PHENYLEPHRINE 40 MCG/ML (10ML) SYRINGE FOR IV PUSH (FOR BLOOD PRESSURE SUPPORT)
PREFILLED_SYRINGE | INTRAVENOUS | Status: DC | PRN
  Administered 2018-11-10 (×2): 80 ug via INTRAVENOUS
  Administered 2018-11-10: 40 ug via INTRAVENOUS
  Administered 2018-11-10 (×3): 80 ug via INTRAVENOUS

## 2018-11-10 MED ORDER — SODIUM CHLORIDE 0.9% FLUSH: 10.0000 mL | INTRAVENOUS | Status: DC | PRN

## 2018-11-10 MED ORDER — ESMOLOL HCL 100 MG/10ML IV SOLN
INTRAVENOUS | Status: AC
  Filled 2018-11-10: qty 10

## 2018-11-10 MED ORDER — CHLORHEXIDINE GLUCONATE CLOTH 2 % EX PADS
6.0000 | MEDICATED_PAD | Freq: Every day | CUTANEOUS | Status: DC
  Administered 2018-11-10 – 2018-11-12 (×3): 6 via TOPICAL

## 2018-11-10 MED ORDER — BISACODYL 10 MG RE SUPP: 10.0000 mg | Freq: Every day | RECTAL | Status: DC

## 2018-11-10 MED ORDER — SODIUM CHLORIDE 0.9 % IV SOLN: 250.0000 mL | INTRAVENOUS | Status: DC

## 2018-11-10 MED ORDER — FENTANYL CITRATE (PF) 250 MCG/5ML IJ SOLN
INTRAMUSCULAR | Status: AC
  Filled 2018-11-10: qty 5

## 2018-11-10 MED ORDER — ACETAMINOPHEN 500 MG PO TABS
1000.0000 mg | ORAL_TABLET | Freq: Four times a day (QID) | ORAL | Status: DC
  Administered 2018-11-11 – 2018-11-13 (×9): 1000 mg via ORAL
  Filled 2018-11-10 (×9): qty 2

## 2018-11-10 MED ORDER — POTASSIUM CHLORIDE 10 MEQ/50ML IV SOLN: 10.0000 meq | INTRAVENOUS | Status: AC

## 2018-11-10 MED ORDER — CHLORHEXIDINE GLUCONATE 0.12 % MT SOLN
15.0000 mL | OROMUCOSAL | Status: AC
  Administered 2018-11-10: 15 mL via OROMUCOSAL

## 2018-11-10 MED ORDER — THROMBIN 20000 UNITS EX SOLR
OROMUCOSAL | Status: DC | PRN
  Administered 2018-11-10 (×3): 4 mL via TOPICAL

## 2018-11-10 MED ORDER — LACTATED RINGERS IV SOLN: 500.0000 mL | Freq: Once | INTRAVENOUS | Status: DC | PRN

## 2018-11-10 MED ORDER — TRAMADOL HCL 50 MG PO TABS: 50.0000 mg | ORAL_TABLET | ORAL | Status: DC | PRN

## 2018-11-10 MED ORDER — SODIUM CHLORIDE 0.45 % IV SOLN
INTRAVENOUS | Status: DC | PRN
  Administered 2018-11-10: 13:00:00 via INTRAVENOUS

## 2018-11-10 MED ORDER — MIDAZOLAM HCL 2 MG/2ML IJ SOLN: 2.0000 mg | INTRAMUSCULAR | Status: DC | PRN

## 2018-11-10 MED ORDER — PROTAMINE SULFATE 10 MG/ML IV SOLN
INTRAVENOUS | Status: AC
  Filled 2018-11-10: qty 25

## 2018-11-10 MED ORDER — ONDANSETRON HCL 4 MG/2ML IJ SOLN
4.0000 mg | Freq: Four times a day (QID) | INTRAMUSCULAR | Status: DC | PRN
  Administered 2018-11-11 – 2018-11-12 (×3): 4 mg via INTRAVENOUS
  Filled 2018-11-10 (×3): qty 2

## 2018-11-10 MED ORDER — METOPROLOL TARTRATE 12.5 MG HALF TABLET: 12.5000 mg | ORAL_TABLET | Freq: Two times a day (BID) | ORAL | Status: DC

## 2018-11-10 MED ORDER — ALPRAZOLAM 0.25 MG PO TABS: 0.2500 mg | ORAL_TABLET | Freq: Three times a day (TID) | ORAL | Status: DC | PRN

## 2018-11-10 MED ORDER — LACTATED RINGERS IV SOLN
INTRAVENOUS | Status: DC
  Administered 2018-11-10 – 2018-11-11 (×2): via INTRAVENOUS

## 2018-11-10 MED ORDER — OXYCODONE HCL 5 MG PO TABS
5.0000 mg | ORAL_TABLET | ORAL | Status: DC | PRN
  Administered 2018-11-11: 10 mg via ORAL
  Administered 2018-11-11 (×3): 5 mg via ORAL
  Filled 2018-11-10 (×2): qty 1
  Filled 2018-11-10: qty 2
  Filled 2018-11-10: qty 1

## 2018-11-10 MED ORDER — EPHEDRINE 5 MG/ML INJ
INTRAVENOUS | Status: AC
  Filled 2018-11-10: qty 10

## 2018-11-10 MED ORDER — NITROGLYCERIN 0.2 MG/ML ON CALL CATH LAB
INTRAVENOUS | Status: DC | PRN
  Administered 2018-11-10 (×2): 40 ug via INTRAVENOUS
  Administered 2018-11-10: 20 ug via INTRAVENOUS
  Administered 2018-11-10 (×2): 40 ug via INTRAVENOUS
  Administered 2018-11-10 (×2): 20 ug via INTRAVENOUS
  Administered 2018-11-10: 40 ug via INTRAVENOUS

## 2018-11-10 MED ORDER — MORPHINE SULFATE (PF) 2 MG/ML IV SOLN
1.0000 mg | INTRAVENOUS | Status: DC | PRN
  Administered 2018-11-10 – 2018-11-11 (×2): 2 mg via INTRAVENOUS
  Administered 2018-11-11: 4 mg via INTRAVENOUS
  Administered 2018-11-11: 2 mg via INTRAVENOUS
  Administered 2018-11-11: 4 mg via INTRAVENOUS
  Administered 2018-11-11: 2 mg via INTRAVENOUS
  Filled 2018-11-10: qty 1
  Filled 2018-11-10 (×2): qty 2
  Filled 2018-11-10: qty 1
  Filled 2018-11-10: qty 2
  Filled 2018-11-10: qty 1

## 2018-11-10 MED ORDER — LACTATED RINGERS IV SOLN: INTRAVENOUS | Status: DC

## 2018-11-10 MED ORDER — SODIUM CHLORIDE 0.9 % IV SOLN
INTRAVENOUS | Status: DC | PRN
  Administered 2018-11-10: 40 ug/min via INTRAVENOUS

## 2018-11-10 MED ORDER — SODIUM CHLORIDE 0.9 % IV SOLN: INTRAVENOUS | Status: DC

## 2018-11-10 MED ORDER — SODIUM CHLORIDE 0.9% FLUSH: 3.0000 mL | INTRAVENOUS | Status: DC | PRN

## 2018-11-10 MED ORDER — FENTANYL CITRATE (PF) 250 MCG/5ML IJ SOLN
INTRAMUSCULAR | Status: DC | PRN
  Administered 2018-11-10: 250 ug via INTRAVENOUS
  Administered 2018-11-10 (×3): 50 ug via INTRAVENOUS
  Administered 2018-11-10: 100 ug via INTRAVENOUS

## 2018-11-10 MED ORDER — THROMBIN (RECOMBINANT) 20000 UNITS EX SOLR
CUTANEOUS | Status: AC
  Filled 2018-11-10: qty 20000

## 2018-11-10 MED ORDER — BISACODYL 5 MG PO TBEC
10.0000 mg | DELAYED_RELEASE_TABLET | Freq: Every day | ORAL | Status: DC
  Administered 2018-11-11 – 2018-11-12 (×2): 10 mg via ORAL
  Filled 2018-11-10 (×2): qty 2

## 2018-11-10 MED ORDER — ACETAMINOPHEN 650 MG RE SUPP
650.0000 mg | Freq: Once | RECTAL | Status: AC
  Administered 2018-11-10: 650 mg via RECTAL

## 2018-11-10 SURGICAL SUPPLY — 61 items
ADAPTER CARDIO PERF ANTE/RETRO (ADAPTER) ×3 IMPLANT
BAG DECANTER FOR FLEXI CONT (MISCELLANEOUS) ×3 IMPLANT
BLADE STERNUM SYSTEM 6 (BLADE) ×3 IMPLANT
BLADE SURG 15 STRL LF DISP TIS (BLADE) ×2 IMPLANT
BLADE SURG 15 STRL SS (BLADE) ×1
BLANKET WARM CARDIAC ADLT BAIR (MISCELLANEOUS) ×3 IMPLANT
CANISTER SUCT 3000ML PPV (MISCELLANEOUS) ×3 IMPLANT
CANNULA GUNDRY RCSP 15FR (MISCELLANEOUS) ×3 IMPLANT
CATH HEART VENT LEFT (CATHETERS) ×2 IMPLANT
CATH ROBINSON RED A/P 18FR (CATHETERS) ×9 IMPLANT
CATH THORACIC 36FR (CATHETERS) ×3 IMPLANT
CATH THORACIC 36FR RT ANG (CATHETERS) ×3 IMPLANT
CONT SPEC 4OZ CLIKSEAL STRL BL (MISCELLANEOUS) ×3 IMPLANT
COVER SURGICAL LIGHT HANDLE (MISCELLANEOUS) ×3 IMPLANT
CRADLE DONUT ADULT HEAD (MISCELLANEOUS) ×3 IMPLANT
DRAPE CARDIOVASCULAR INCISE (DRAPES) ×1
DRAPE SLUSH/WARMER DISC (DRAPES) ×3 IMPLANT
DRAPE SRG 135X102X78XABS (DRAPES) ×2 IMPLANT
DRSG COVADERM 4X14 (GAUZE/BANDAGES/DRESSINGS) ×3 IMPLANT
ELECT CAUTERY BLADE 6.4 (BLADE) ×3 IMPLANT
ELECT REM PT RETURN 9FT ADLT (ELECTROSURGICAL) ×6
ELECTRODE REM PT RTRN 9FT ADLT (ELECTROSURGICAL) ×4 IMPLANT
FELT TEFLON 1X6 (MISCELLANEOUS) ×6 IMPLANT
GAUZE SPONGE 4X4 12PLY STRL (GAUZE/BANDAGES/DRESSINGS) ×3 IMPLANT
GAUZE SPONGE 4X4 12PLY STRL LF (GAUZE/BANDAGES/DRESSINGS) ×3 IMPLANT
GLOVE EUDERMIC 7 POWDERFREE (GLOVE) ×6 IMPLANT
GOWN STRL REUS W/ TWL LRG LVL3 (GOWN DISPOSABLE) ×8 IMPLANT
GOWN STRL REUS W/ TWL XL LVL3 (GOWN DISPOSABLE) ×2 IMPLANT
GOWN STRL REUS W/TWL LRG LVL3 (GOWN DISPOSABLE) ×4
GOWN STRL REUS W/TWL XL LVL3 (GOWN DISPOSABLE) ×1
HEMOSTAT POWDER SURGIFOAM 1G (HEMOSTASIS) ×9 IMPLANT
HEMOSTAT SURGICEL 2X14 (HEMOSTASIS) ×3 IMPLANT
KIT BASIN OR (CUSTOM PROCEDURE TRAY) ×3 IMPLANT
KIT CATH CPB BARTLE (MISCELLANEOUS) ×3 IMPLANT
KIT SUCTION CATH 14FR (SUCTIONS) ×3 IMPLANT
KIT TURNOVER KIT B (KITS) ×3 IMPLANT
LINE VENT (MISCELLANEOUS) ×3 IMPLANT
NS IRRIG 1000ML POUR BTL (IV SOLUTION) ×18 IMPLANT
PACK E OPEN HEART (SUTURE) ×3 IMPLANT
PACK OPEN HEART (CUSTOM PROCEDURE TRAY) ×3 IMPLANT
PAD ARMBOARD 7.5X6 YLW CONV (MISCELLANEOUS) ×6 IMPLANT
SET CARDIOPLEGIA MPS 5001102 (MISCELLANEOUS) ×3 IMPLANT
SUT BONE WAX W31G (SUTURE) ×3 IMPLANT
SUT ETHIBON 2 0 V 52N 30 (SUTURE) ×6 IMPLANT
SUT PROLENE 3 0 SH1 36 (SUTURE) ×3 IMPLANT
SUT PROLENE 4 0 RB 1 (SUTURE) ×5
SUT PROLENE 4-0 RB1 .5 CRCL 36 (SUTURE) ×10 IMPLANT
SUT STEEL 6MS V (SUTURE) ×6 IMPLANT
SUT VIC AB 1 CTX 27 (SUTURE) ×3 IMPLANT
SUT VIC AB 1 CTX 36 (SUTURE) ×2
SUT VIC AB 1 CTX36XBRD ANBCTR (SUTURE) ×4 IMPLANT
SYSTEM SAHARA CHEST DRAIN ATS (WOUND CARE) ×3 IMPLANT
TAPE CLOTH SOFT 2X10 (GAUZE/BANDAGES/DRESSINGS) ×3 IMPLANT
TAPE CLOTH SURG 4X10 WHT LF (GAUZE/BANDAGES/DRESSINGS) ×3 IMPLANT
TOWEL GREEN STERILE (TOWEL DISPOSABLE) ×3 IMPLANT
TOWEL GREEN STERILE FF (TOWEL DISPOSABLE) ×3 IMPLANT
TRAY FOLEY SLVR 16FR TEMP STAT (SET/KITS/TRAYS/PACK) ×3 IMPLANT
UNDERPAD 30X30 (UNDERPADS AND DIAPERS) ×3 IMPLANT
VALVE AORTIC SZ23 INSP/RESIL (Prosthesis & Implant Heart) ×3 IMPLANT
VENT LEFT HEART 12002 (CATHETERS) ×3
WATER STERILE IRR 1000ML POUR (IV SOLUTION) ×6 IMPLANT

## 2018-11-10 NOTE — Anesthesia Procedure Notes (Signed)
Arterial Line Insertion Start/End4/30/2020 6:40 AM, 11/10/2018 6:55 AM Performed by: Murvin Natal, MD, Milford Cage, CRNA, CRNA  Patient location: Pre-op. Preanesthetic checklist: patient identified, IV checked, site marked, risks and benefits discussed, surgical consent, monitors and equipment checked, pre-op evaluation, timeout performed and anesthesia consent Lidocaine 1% used for infiltration Left, radial was placed Catheter size: 20 G Hand hygiene performed , maximum sterile barriers used  and Seldinger technique used Allen's test indicative of satisfactory collateral circulation Attempts: 1 Procedure performed without using ultrasound guided technique. Following insertion, dressing applied. Post procedure assessment: normal and unchanged

## 2018-11-10 NOTE — Anesthesia Procedure Notes (Signed)
Procedure Name: Intubation Performed by: Milford Cage, CRNA Pre-anesthesia Checklist: Patient identified, Emergency Drugs available, Suction available and Patient being monitored Patient Re-evaluated:Patient Re-evaluated prior to induction Oxygen Delivery Method: Circle System Utilized Preoxygenation: Pre-oxygenation with 100% oxygen Induction Type: IV induction Ventilation: Mask ventilation without difficulty Laryngoscope Size: Mac and 3 Grade View: Grade I Tube type: Oral Tube size: 8.0 mm Number of attempts: 1 Airway Equipment and Method: Stylet and Oral airway Placement Confirmation: ETT inserted through vocal cords under direct vision,  positive ETCO2 and breath sounds checked- equal and bilateral Secured at: 23 cm Tube secured with: Tape Dental Injury: Teeth and Oropharynx as per pre-operative assessment

## 2018-11-10 NOTE — Progress Notes (Signed)
Posted extubation pt was complaining about discomfort with foley, stating "I have to pee really bad" and "I feel like my bladder is full". RN bladder scanned pt and got greater than 650 as result. Foley was flushed to check for any blockages but no urin was returned. Rn then drained water from foley balloon and inserted foley until urin was draining. Balloon was inflated approximately 2 inches higher. Over 775mls of urin was drained from bladder. Upon assessment urin turned colors from yellow to red. MD aware. Will continue to monitor output.

## 2018-11-10 NOTE — Progress Notes (Signed)
CT surgery p.m. Rounds  Extubated after AVR for aortic stenosis Hemodynamic stable Minimal chest tube output Postop labs satisfactory including post extubation blood gas Foley catheter was manipulated for poor drainage now with bladder emptied.  Patient has history of prostate enlargement.

## 2018-11-10 NOTE — Transfer of Care (Signed)
Immediate Anesthesia Transfer of Care Note  Patient: Joshua Hess  Procedure(s) Performed: AORTIC VALVE REPLACEMENT (AVR), USING 23MM INSPIRIS (N/A Chest) TRANSESOPHAGEAL ECHOCARDIOGRAM (TEE) (N/A )  Patient Location: ICU  Anesthesia Type:General  Level of Consciousness: sedated and Patient remains intubated per anesthesia plan  Airway & Oxygen Therapy: Patient remains intubated per anesthesia plan and Patient placed on Ventilator (see vital sign flow sheet for setting)  Post-op Assessment: Report given to RN and Post -op Vital signs reviewed and stable  Post vital signs: Reviewed and stable  Last Vitals: See Flowsheet Vitals Value Taken Time  BP    Temp    Pulse    Resp    SpO2      Last Pain:  Vitals:   11/10/18 0405  TempSrc: Oral  PainSc: 0-No pain      Patients Stated Pain Goal: 4 (48/54/62 7035)  Complications: No apparent anesthesia complications

## 2018-11-10 NOTE — Brief Op Note (Signed)
11/08/2018 - 11/10/2018  9:54 AM  PATIENT:  Ronnald Collum  78 y.o. male  PRE-OPERATIVE DIAGNOSIS:  SEVERE AS  POST-OPERATIVE DIAGNOSIS:  SEVERE AS  PROCEDURE:  Procedure(s): AORTIC VALVE REPLACEMENT (AVR), USING 23MM INSPIRIS (N/A) TRANSESOPHAGEAL ECHOCARDIOGRAM (TEE) (N/A)  SURGEON:  Surgeon(s) and Role:    * Bartle, Fernande Boyden, MD - Primary  PHYSICIAN ASSISTANT:  Nicholes Rough, PA-C   ANESTHESIA:   general  EBL:  550 mL   BLOOD ADMINISTERED:none  DRAINS: ROUTINE   LOCAL MEDICATIONS USED:  NONE  SPECIMEN:  Source of Specimen:  AORTIC VALVE LEAFLETS  DISPOSITION OF SPECIMEN:  PATHOLOGY  COUNTS:  YES  DICTATION: .Dragon Dictation  PLAN OF CARE: Admit to inpatient   PATIENT DISPOSITION:  ICU - intubated and hemodynamically stable.   Delay start of Pharmacological VTE agent (>24hrs) due to surgical blood loss or risk of bleeding: yes

## 2018-11-10 NOTE — Anesthesia Postprocedure Evaluation (Signed)
Anesthesia Post Note  Patient: Joshua Hess  Procedure(s) Performed: AORTIC VALVE REPLACEMENT (AVR), USING 23MM INSPIRIS (N/A Chest) TRANSESOPHAGEAL ECHOCARDIOGRAM (TEE) (N/A )     Patient location during evaluation: ICU Anesthesia Type: General Level of consciousness: awake Pain management: pain level controlled Vital Signs Assessment: post-procedure vital signs reviewed and stable Respiratory status: spontaneous breathing, nonlabored ventilation, respiratory function stable and patient connected to nasal cannula oxygen Cardiovascular status: blood pressure returned to baseline and stable Postop Assessment: no apparent nausea or vomiting Anesthetic complications: no    Last Vitals:  Vitals:   11/10/18 1745 11/10/18 1800  BP:  117/74  Pulse: 80 80  Resp: 16 20  Temp: (!) 36.3 C   SpO2: 98% 98%    Last Pain:  Vitals:   11/10/18 1300  TempSrc:   PainSc: Asleep                 Ryan P Ellender

## 2018-11-10 NOTE — Procedures (Signed)
Extubation Procedure Note  Patient Details:   Name: Joshua Hess DOB: Jun 25, 1941 MRN: [id number]   Airway Documentation:    Vent end date: 11/10/18 Vent end time: 1540   Evaluation  O2 sats: stable throughout Complications: No apparent complications Patient did tolerate procedure well. Bilateral Breath Sounds: Clear, Diminished   Yes   Patient performed a VC of 1.4L and a NIF of -20. Patient was then extubated to a 4L [place]. Cuff leak was heard. No stridor was noted. RN was at the bedside with RT during extubation.  Tiburcio Bash 11/10/2018, 3:46 PM

## 2018-11-10 NOTE — Progress Notes (Signed)
  Echocardiogram Echocardiogram Transesophageal has been performed.  Joshua Hess 11/10/2018, 8:35 AM

## 2018-11-10 NOTE — Op Note (Addendum)
CARDIOVASCULAR SURGERY OPERATIVE NOTE  11/10/2018 Joshua Hess [id number]  Surgeon:  Gaye Pollack, MD  First Assistant: Nicholes Rough,  PA-C   Preoperative Diagnosis:  Severe aortic stenosis   Postoperative Diagnosis:  Same   Procedure:                                                     1. Median Sternotomy 2. Extracorporeal circulation 3.   Aortic valve replacement using a 23 mm Edwards INSPIRIS RESILIA valve.  Anesthesia:  General Endotracheal   Clinical History/Surgical Indication:  This 78 year old gentleman has stage D, severe, symptomatic aortic stenosis with New York Heart Association class III symptoms of exertional fatigue and shortness of breath consistent with chronic diastolic congestive heart failure as well as chest pressure at rest and with exercise and some dizziness. His symptoms have been progressing over the past month. His echocardiogram shows a trileaflet aortic valve with severe thickening and calcification of the leaflets with decreased mobility. The mean gradient is 46 mmHg consistent with severe aortic stenosis. There is also moderate aortic insufficiency. Left ventricular ejection fraction is normal. Cardiac catheterization shows mild nonobstructive coronary disease. He was taken to the operating room on Tuesday this week for TAVR but preop evaluation of his gated cardiac CTA images showed a possible thrombus on the valve around the right/non-coronary commissure. He was in the OR so we proceeded with TEE and this confirmed a slightly mobile lesion in this area that suggested a possible thrombus so we decided not to proceed with TAVR and plan open surgical AVR after further discussion with the patient. I discussed the operative procedure with the patient and his significant other  including alternatives, benefits and risks; including but not limited to bleeding, blood transfusion, infection, stroke, myocardial infarction, graft failure, heart block requiring a permanent pacemaker, organ dysfunction, and death.  Joshua Hess understands and agrees to proceed.     Preparation:  The patient was seen in the preoperative holding area and the correct patient, correct operation were confirmed with the patient after reviewing the medical record and catheterization. The consent was signed by me. Preoperative antibiotics were given. A radial arterial line was placed by the anesthesia team. We did not insert a PA catheter since he already had a central line placed Tuesday for his planned TAVR and he had normal LV function so a PA catheter was unnecessary. The patient was taken back to the operating room and positioned supine on the operating room table. After being placed under general endotracheal anesthesia by the anesthesia team a foley catheter was placed. The neck, chest, abdomen, and both legs were prepped with betadine soap and solution and draped in the usual sterile manner. A surgical time-out was taken and the correct patient and operative procedure were confirmed with the nursing and anesthesia staff.   Pre-bypass TEE:   Complete TEE assessment was performed by Dr. Adele Barthel. This showed severe AS with mild AI. LV function was normal. Trivial MR.    Post-bypass TEE:   Normal functioning prosthetic aortic valve with no perivalvular leak or regurgitation through the valve. Left ventricular function preserved. trivial mitral regurgitation.    Cardiopulmonary Bypass:  A median sternotomy was performed. The pericardium was opened in the midline. Right ventricular function appeared normal. The ascending aorta was of normal size and  had no palpable plaque. There were no contraindications to aortic cannulation or cross-clamping. The patient was fully systemically  heparinized and the ACT was maintained > 400 sec. The proximal aortic arch was cannulated with a 20 F aortic cannula for arterial inflow. Venous cannulation was performed via the right atrial appendage using a two-staged venous cannula. An antegrade cardioplegia/vent cannula was inserted into the mid-ascending aorta. A left ventricular vent was placed via the right superior pulmonary vein. A retrograde cardioplegia cannnula was placed into the coronary sinus via the right atrium. Aortic occlusion was performed with a single cross-clamp. Systemic cooling to 32 degrees Centigrade and topical cooling of the heart with iced saline were used. Hyperkalemic antegrade cold blood cardioplegia was used to induce diastolic arrest and then cold blood retrograde cardioplegia was given at about 20 minute intervals throughout the period of arrest to maintain myocardial temperature at or below 10 degrees centigrade. A temperature probe was inserted into the interventricular septum and an insulating pad was placed in the pericardium. Carbon dioxide was insufflated into the pericardium at 5L/min throughout the procedure to minimize intracardiac air.   Aortic Valve Replacement:   A transverse aortotomy was performed 1 cm above the take-off of the right coronary artery. The native valve was tricuspid with calcified leaflets and mild annular calcification. There was significant lesion below the right/non-coronary commissure on the ventricular surface of the commissure which was a mass of non-calcified, organized thrombus or inflammatory debri. This was firmly adherent. ( picture in media section of Epic).The ostia of the coronary arteries were in normal position and were not obstructed. The native valve leaflets as well as the debri beneath the commissure were excised and the annulus was decalcified with rongeurs. Care was taken to remove all particulate debris. The left ventricle was directly inspected for debris and then  irrigated with ice saline solution. The annulus was sized and a size 23 mm Edwards INSPIRIS RESILIA pericardial valve was chosen. The model number was [id number] and the serial number was [id number]. While the valve was being prepared 2-0 Ethibond pledgeted horizontal mattress sutures were placed around the annulus with the pledgets in a sub-annular position. The sutures were placed through the sewing ring and the valve lowered into place. The sutures were tied sequentially. The valve seated nicely and the coronary ostia were not obstructed. The prosthetic valve leaflets moved normally and there was no sub-valvular obstruction. The aortotomy was closed using 4-0 Prolene suture in 2 layers with felt strips to reinforce the closure.  Completion:  The patient was rewarmed to 37 degrees Centigrade. De-airing maneuvers were performed and the head placed in trendelenburg position. The crossclamp was removed with a time of 81 minutes. There was spontaneous return of sinus rhythm. The aortotomy was checked for hemostasis. Two temporary epicardial pacing wires were placed on the right atrium and two on the right ventricle. The left ventricular vent and retrograde cardioplegia cannulas were removed. The patient was weaned from CPB without difficulty on no inotropes. CPB time was 104 minutes. TEE showed normal LV function. Heparin was fully reversed with protamine and the aortic and venous cannulas removed. Hemostasis was achieved. Mediastinal drainage tubes were placed. The sternum was closed with  #6 stainless steel wires. The fascia was closed with continuous # 1 vicryl suture. The subcutaneous tissue was closed with 2-0 vicryl continuous suture. The skin was closed with 3-0 vicryl subcuticular suture. All sponge, needle, and instrument counts were reported correct at the end of the case. Dry  sterile dressings were placed over the incisions and around the chest tubes which were connected to pleurevac suction. The patient was  then transported to the surgical intensive care unit in stable condition.

## 2018-11-11 ENCOUNTER — Inpatient Hospital Stay (HOSPITAL_COMMUNITY): Payer: Medicare HMO

## 2018-11-11 ENCOUNTER — Encounter (HOSPITAL_COMMUNITY): Payer: Self-pay | Admitting: Surgery

## 2018-11-11 LAB — BASIC METABOLIC PANEL
Anion gap: 9 (ref 5–15)
Anion gap: 9 (ref 5–15)
BUN: 23 mg/dL (ref 8–23)
BUN: 27 mg/dL — ABNORMAL HIGH (ref 8–23)
CO2: 23 mmol/L (ref 22–32)
CO2: 27 mmol/L (ref 22–32)
Calcium: 7.8 mg/dL — ABNORMAL LOW (ref 8.9–10.3)
Calcium: 8.2 mg/dL — ABNORMAL LOW (ref 8.9–10.3)
Chloride: 104 mmol/L (ref 98–111)
Chloride: 101 mmol/L (ref 98–111)
Creatinine, Ser: 0.97 mg/dL (ref 0.61–1.24)
Creatinine, Ser: 1.11 mg/dL (ref 0.61–1.24)
GFR calc Af Amer: >60 mL/min (ref >60)
GFR calc Af Amer: >60 mL/min (ref >60)
GFR calc non Af Amer: >60 mL/min (ref >60)
GFR calc non Af Amer: >60 mL/min (ref >60)
Glucose, Bld: 119 mg/dL — ABNORMAL HIGH (ref 70–99)
Glucose, Bld: 139 mg/dL — ABNORMAL HIGH (ref 70–99)
Potassium: 5.4 mmol/L — ABNORMAL HIGH (ref 3.5–5.1)
Potassium: 4 mmol/L (ref 3.5–5.1)
Sodium: 136 mmol/L (ref 135–145)
Sodium: 137 mmol/L (ref 135–145)

## 2018-11-11 LAB — CBC
HCT: 36.4 % — ABNORMAL LOW (ref 39.0–52.0)
HCT: 35.6 % — ABNORMAL LOW (ref 39.0–52.0)
Hemoglobin: 13 g/dL (ref 13.0–17.0)
Hemoglobin: 12.4 g/dL — ABNORMAL LOW (ref 13.0–17.0)
MCH: 33.9 pg (ref 26.0–34.0)
MCH: 33.6 pg (ref 26.0–34.0)
MCHC: 35.7 g/dL (ref 30.0–36.0)
MCHC: 34.8 g/dL (ref 30.0–36.0)
MCV: 95 fL (ref 80.0–100.0)
MCV: 96.5 fL (ref 80.0–100.0)
Platelets: 121 10*3/uL — ABNORMAL LOW (ref 150–400)
Platelets: 116 10*3/uL — ABNORMAL LOW (ref 150–400)
RBC: 3.83 MIL/uL — ABNORMAL LOW (ref 4.22–5.81)
RBC: 3.69 MIL/uL — ABNORMAL LOW (ref 4.22–5.81)
RDW: 11.9 % (ref 11.5–15.5)
RDW: 11.9 % (ref 11.5–15.5)
WBC: 14.4 10*3/uL — ABNORMAL HIGH (ref 4.0–10.5)
WBC: 14.7 10*3/uL — ABNORMAL HIGH (ref 4.0–10.5)
nRBC: 0 % (ref 0.0–0.2)
nRBC: 0 % (ref 0.0–0.2)

## 2018-11-11 LAB — MAGNESIUM
Magnesium: 2.2 mg/dL (ref 1.7–2.4)
Magnesium: 1.9 mg/dL (ref 1.7–2.4)

## 2018-11-11 LAB — GLUCOSE, CAPILLARY
Glucose-Capillary: 113 mg/dL — ABNORMAL HIGH (ref 70–99)
Glucose-Capillary: 116 mg/dL — ABNORMAL HIGH (ref 70–99)
Glucose-Capillary: 121 mg/dL — ABNORMAL HIGH (ref 70–99)

## 2018-11-11 MED ORDER — TRAMADOL HCL 50 MG PO TABS
50.0000 mg | ORAL_TABLET | ORAL | Status: DC | PRN
  Administered 2018-11-11 – 2018-11-12 (×2): 50 mg via ORAL
  Filled 2018-11-11 (×2): qty 1

## 2018-11-11 MED ORDER — FUROSEMIDE 10 MG/ML IJ SOLN
40.0000 mg | Freq: Once | INTRAMUSCULAR | Status: AC
  Administered 2018-11-11: 40 mg via INTRAVENOUS
  Filled 2018-11-11: qty 4

## 2018-11-11 MED ORDER — KETOROLAC TROMETHAMINE 15 MG/ML IJ SOLN
15.0000 mg | Freq: Four times a day (QID) | INTRAMUSCULAR | Status: DC | PRN
  Administered 2018-11-11 (×3): 15 mg via INTRAVENOUS
  Filled 2018-11-11 (×3): qty 1

## 2018-11-11 NOTE — Plan of Care (Signed)
  Problem: Education: Goal: Knowledge of General Education information will improve Description Including pain rating scale, medication(s)/side effects and non-pharmacologic comfort measures Outcome: Progressing   Problem: Health Behavior/Discharge Planning: Goal: Ability to manage health-related needs will improve Outcome: Progressing   Problem: Clinical Measurements: Goal: Ability to maintain clinical measurements within normal limits will improve Outcome: Progressing Goal: Will remain free from infection Outcome: Progressing Goal: Diagnostic test results will improve Outcome: Progressing Goal: Respiratory complications will improve Outcome: Progressing Goal: Cardiovascular complication will be avoided Outcome: Progressing   Problem: Activity: Goal: Risk for activity intolerance will decrease Outcome: Progressing   Problem: Coping: Goal: Level of anxiety will decrease Outcome: Progressing   Problem: Elimination: Goal: Will not experience complications related to bowel motility Outcome: Progressing   Problem: Pain Managment: Goal: General experience of comfort will improve Outcome: Progressing   Problem: Safety: Goal: Ability to remain free from injury will improve Outcome: Progressing   Problem: Skin Integrity: Goal: Risk for impaired skin integrity will decrease Outcome: Progressing   Problem: Cardiac: Goal: Will achieve and/or maintain hemodynamic stability Outcome: Progressing   Problem: Clinical Measurements: Goal: Postoperative complications will be avoided or minimized Outcome: Progressing   Problem: Respiratory: Goal: Respiratory status will improve Outcome: Progressing   Problem: Skin Integrity: Goal: Wound healing without signs and symptoms of infection Outcome: Progressing Goal: Risk for impaired skin integrity will decrease Outcome: Progressing   Problem: Urinary Elimination: Goal: Ability to achieve and maintain adequate renal perfusion  and functioning will improve Outcome: Progressing   Problem: Nutrition: Goal: Adequate nutrition will be maintained Outcome: Not Progressing

## 2018-11-11 NOTE — Progress Notes (Signed)
Pt dangled feet on edge of bed with minimal drainage from all 3 chest tubes.  Pt tolerated movement well, complained of feeling dizzy but no increase in pain.  HR and BP unchanged.  All three chest tubes removed without complication. Incision sites cleaned and edges approximated with sutures that were previously in place.  Suture lines cleansed and dressed with a guaze dressing. Minimal bleeding at the top of the incision line.  Gauze taped to the skin.  Pt tolerated procedure well.  Lung sounds unchanged after chest tube removal. Pt resting in bed at this time. Will continue to monitor.

## 2018-11-11 NOTE — Progress Notes (Signed)
Assisted tele visit to patient with family member.  Monty Mccarrell Anderson, RN   

## 2018-11-11 NOTE — Plan of Care (Signed)
  Problem: Clinical Measurements: Goal: Respiratory complications will improve Outcome: Progressing   Problem: Clinical Measurements: Goal: Cardiovascular complication will be avoided Outcome: Progressing   Problem: Activity: Goal: Risk for activity intolerance will decrease Outcome: Progressing   Problem: Nutrition: Goal: Adequate nutrition will be maintained Outcome: Progressing   Problem: Coping: Goal: Level of anxiety will decrease Outcome: Progressing   Problem: Pain Managment: Goal: General experience of comfort will improve Outcome: Progressing   Problem: Safety: Goal: Ability to remain free from injury will improve Outcome: Progressing   Problem: Skin Integrity: Goal: Risk for impaired skin integrity will decrease Outcome: Progressing   Problem: Cardiac: Goal: Will achieve and/or maintain hemodynamic stability Outcome: Progressing   Problem: Clinical Measurements: Goal: Postoperative complications will be avoided or minimized Outcome: Progressing   Problem: Urinary Elimination: Goal: Ability to achieve and maintain adequate renal perfusion and functioning will improve Outcome: Progressing

## 2018-11-11 NOTE — Discharge Summary (Signed)
[address]       [place] [address]             [phone]      Physician Discharge Summary  Patient ID: AVROHOM MCKELVIN MRN: [id number] DOB/AGE: [birth year] 78 y.o.  Admit date: 11/08/2018 Discharge date: 11/15/2018  Admission Diagnoses: Patient Active Problem List   Diagnosis Date Noted   S/P AVR (aortic valve replacement)    Carotid artery stenosis    Chronotropic incompetence    Severe aortic stenosis    Sinus bradycardia 08/12/2018   Benign prostatic hyperplasia with nocturia 11/01/2012    Discharge Diagnoses:  Principal Problem:   Severe aortic stenosis Active Problems:   Benign prostatic hyperplasia with nocturia   Carotid artery stenosis   Chronotropic incompetence   S/P AVR (aortic valve replacement)   Discharged Condition: good  HPI:   The patient is a 78 year old gentleman who is been extremely active and physically fit for his entire adult life as an avid runner and Production designer, theatre/television/film. For the past 1-1/2 years he is developed progressive exertional fatigue and has noticed that he has not been able to run as far due to fatigue. He has not noted any shortness of breath or chest discomfort. He was noted to have a heart murmur on exam and had an echocardiogram done in July 2019 which showed ejection fraction 55 to 60% and a mean aortic valve gradient of 25.5 mmHg with an aortic valve area of 0.8 cm. He was referred to Dr. Sallyanne Kuster.His most recent follow-up echo in January 2020 showed a mean gradient across aortic valve of 46 mmHg with peak gradient of 81 mmHg. Aortic valve area was 0.86 cm. There is moderate aortic insufficiency. Left ventricular ejection fraction was 60 to 65%. He had carotid Dopplers done at [facility] in July 2019 which showed a 50 to 70% left internal carotid artery stenosis. The patient was seen by Dr. Angelena Form on 10/27/2018 and was felt to be a suitable candidate for transcatheter aortic valve  replacement.  The patient notes that over the past month he has further progression of exertional symptoms including shortness of breath and some chest pressure. He was seen in the emergency room and electrocardiogram showed no ischemic changes. He reports having some dizziness but no syncope. He has had some episodes of chest discomfort at rest. He denies any peripheral edema. He is here today with his partner.   Hospital Course:   On 4/30 Mr. Mihalik underwent an AVR for severe aortic stenosis with Dr. Cyndia Bent. He tolerated the procedure well and was transferred to the cardiac ICU. He was extubated in a timely manner. POD 1 he remains hemodynamically stable with his baseline heart in the 50s-60s. We are holding lopressor at the moment. We discontinued his chest tubes. We gave one dose of IV lasix for fluid overload. His weight is at his preop weight. He continued to progress in the ICU. He developed brief episodes of Atrial Fibrillation.  He was started on oral Amiodarone and his beta blocker was titrated.  He was ambulating in the SICU without difficulty.  He was medically stable for transfer to the telemetry unit on 11/13/2018.  The patient continued to make good progress.  He was maintaining NSR.  His pacing wires were removed without difficulty.  His incisions are healing without evidence of infection.  His is ambulating without difficulty.  He is medically stable for discharge home today.   Consults: None  Treatments:   CARDIOVASCULAR SURGERY OPERATIVE NOTE  11/10/2018 Ronnald Collum [id number]  Surgeon:  Gaye Pollack, MD  First Assistant: Nicholes Rough,  PA-C   Preoperative Diagnosis:  Severe aortic stenosis   Postoperative Diagnosis:  Same   Procedure:                                                     1. Median Sternotomy 2. Extracorporeal circulation 3.   Aortic valve replacement using a 23 mm Edwards INSPIRIS RESILIA valve.  Anesthesia:  General  Endotracheal    Discharge Exam: Blood pressure (!) 141/74, pulse 62, temperature 98 F (36.7 C), temperature source Oral, resp. rate 15, height 5\' 9"  (1.753 m), weight 75.3 kg, SpO2 100 %.    General appearance: alert, cooperative and no distress Heart: regular rate and rhythm Lungs: clear to auscultation bilaterally Abdomen: benign Extremities: trace edema Wound: incis healing well  Disposition: Discharge disposition: 01-Home or Self Care       Discharge Instructions    Amb Referral to Cardiac Rehabilitation   Complete by:  As directed    Diagnosis:  Valve Replacement   Valve:  Aortic   Discharge patient   Complete by:  As directed    Discharge disposition:  01-Home or Self Care   Discharge patient date:  11/15/2018     Allergies as of 11/15/2018   No Known Allergies     Medication List    STOP taking these medications   Lubricant Eye Drops 0.4-0.3 % Soln Generic drug:  Polyethyl Glycol-Propyl Glycol   sildenafil 25 MG tablet Commonly known as:  VIAGRA     TAKE these medications   aspirin 81 MG EC tablet Take 1 tablet (81 mg total) by mouth daily.   metoprolol tartrate 25 MG tablet Commonly known as:  LOPRESSOR Take 1 tablet (25 mg total) by mouth 2 (two) times daily.   traMADol 50 MG tablet Commonly known as:  Ultram Take 1 tablet (50 mg total) by mouth every 6 (six) hours as needed for up to 7 days.   traZODone 50 MG tablet Commonly known as:  DESYREL Take 50 mg by mouth at bedtime as needed (sleep).      Follow-up Information    [name], MD. Call in 1 day(s).   Specialty:  Internal Medicine Contact information: [address] [place] [address] [phone]        Sanda Klein, MD .   Specialty:  Cardiology Contact information: [place] [address] [phone]        [facility] Deforest, [place] Follow up.   Specialties:  Cardiology, Radiology Why:  Your appointment is on 11/22/2018 at  1:00pm . This will be a virtual visit.  Contact information: [address] [place] [address] [phone]        [facility] [place] Follow up.   Specialty:  Cardiothoracic Surgery Why:  Your appointment is on 12/06/2018 at 3:30pm. Please arrive at 3:00pm for a chest xray located at [facility] which is on the first floor of our building. Contact information: [place], [place] [phone]         The patient has been discharged on:   1.Beta Blocker:  Yes [ X  ]  No   [  ]                              If No, reason:  2.Ace Inhibitor/ARB: Yes [   ]                                     No  [ no  ]                                     If No, reason: normotensive  3.Statin:   Yes [   ]                  No  [  no ]                  If No, reason: NO CAD  4.Shela CommonsVelta Addison  [ yes  ]                  No   [   ]                  If No, reason:    Signed: Wilder Glade [place] 11/15/2018, 7:44 AM

## 2018-11-11 NOTE — Progress Notes (Signed)
Pt friend, [name], called and spoke with this RN and gave update about pt getting up and walking.  Mark aware pt is sleeping and cannot video chat at this time.

## 2018-11-11 NOTE — Progress Notes (Signed)
1 Day Post-Op Procedure(s) (LRB): AORTIC VALVE REPLACEMENT (AVR), USING 23MM INSPIRIS (N/A) TRANSESOPHAGEAL ECHOCARDIOGRAM (TEE) (N/A) Subjective: No specific complaints.  Objective: Vital signs in last 24 hours: Temp:  [96.1 F (35.6 C)-99.3 F (37.4 C)] 98.3 F (36.8 C) (05/01 0700) Pulse Rate:  [54-81] 79 (05/01 0700) Cardiac Rhythm: Atrial paced;Normal sinus rhythm (05/01 0400) Resp:  [7-22] 15 (05/01 0700) BP: (70-139)/(48-83) 125/74 (05/01 0700) SpO2:  [94 %-100 %] 94 % (05/01 0700) Arterial Line BP: (62-147)/(37-102) 100/75 (05/01 0500) FiO2 (%):  [40 %-50 %] 40 % (04/30 1508) Weight:  [76.5 kg] 76.5 kg (05/01 0500)  Hemodynamic parameters for last 24 hours:    Intake/Output from previous day: 04/30 0701 - 05/01 0700 In: 3130.2 [P.O.:240; I.V.:1913.1; Blood:400; IV Piggyback:547.1] Out: 2850 [Urine:1940; Blood:550; Chest Tube:360] Intake/Output this shift: No intake/output data recorded.  General appearance: alert and cooperative Neurologic: intact Heart: regular rate and rhythm, S1, S2 normal, no murmur, click, rub or gallop Lungs: clear to auscultation bilaterally Extremities: extremities normal, atraumatic, no cyanosis or edema Wound: dressing dry  Lab Results: Recent Labs    11/10/18 1713 11/11/18 0320  WBC 11.9* 14.4*  HGB 13.2 13.0  HCT 37.5* 36.4*  PLT 117* 121*   BMET:  Recent Labs    11/10/18 1713 11/11/18 0320  NA 138 136  K 3.9 5.4*  CL 106 104  CO2 22 23  GLUCOSE 146* 119*  BUN 23 23  CREATININE 0.95 0.97  CALCIUM 7.7* 7.8*    PT/INR:  Recent Labs    11/10/18 1210  LABPROT 17.8*  INR 1.5*   ABG    Component Value Date/Time   PHART 7.336 (L) 11/10/2018 1712   HCO3 25.0 11/10/2018 1712   TCO2 26 11/10/2018 1712   ACIDBASEDEF 1.0 11/10/2018 1712   O2SAT 99.0 11/10/2018 1712   CBG (last 3)  Recent Labs    11/10/18 1935 11/11/18 0022 11/11/18 0326  GLUCAP 130* 121* 113*   CXR: bibasilar atelectasis ECG: sinus, no  acute changes  Assessment/Plan: S/P Procedure(s) (LRB): AORTIC VALVE REPLACEMENT (AVR), USING 23MM INSPIRIS (N/A) TRANSESOPHAGEAL ECHOCARDIOGRAM (TEE) (N/A)  POD 1 Hemodynamically stable in sinus rhythm. His baseline heart rate is 50-60's preop so will hold off on Lopressor.  DC chest tubes.  Wt is about at preop but will give one dose of lasix.  OOB, IS, ambulate today.   LOS: 3 days    Joshua Hess 11/11/2018

## 2018-11-11 NOTE — Progress Notes (Signed)
Patient ID: Joshua Hess, male   DOB: [birth year], 78 y.o.   MRN: [id number] EVENING ROUNDS NOTE :     [address]       [place] [address]             [phone]                 1 Day Post-Op Procedure(s) (LRB): AORTIC VALVE REPLACEMENT (AVR), USING 23MM INSPIRIS (N/A) TRANSESOPHAGEAL ECHOCARDIOGRAM (TEE) (N/A)  Total Length of Stay:  LOS: 3 days  BP 137/65   Pulse 77   Temp 98.2 F (36.8 C) (Oral)   Resp 15   Ht 5\' 9"  (1.753 m)   Wt 76.5 kg   SpO2 100%   BMI 24.91 kg/m   .Intake/Output      04/30 0701 - 05/01 0700 05/01 0701 - 05/02 0700   P.O. 240 240   I.V. (mL/kg) 1913.1 (25) 251.8 (3.3)   Blood 400    Other 30    IV Piggyback 547.1 100   Total Intake(mL/kg) 3130.2 (40.9) 591.8 (7.7)   Urine (mL/kg/hr) 1940 (1.1) 1010 (1.2)   Blood 550    Chest Tube 360    Total Output 2850 1010   Net +280.2 -418.2          . sodium chloride Stopped (11/11/18 1100)  . sodium chloride    . sodium chloride    . cefUROXime (ZINACEF)  IV 1.5 g (11/11/18 1503)  . lactated ringers 20 mL/hr at 11/11/18 1800  . nitroGLYCERIN Stopped (11/10/18 1643)  . phenylephrine (NEO-SYNEPHRINE) Adult infusion Stopped (11/11/18 0700)     Lab Results  Component Value Date   WBC 14.7 (H) 11/11/2018   HGB 12.4 (L) 11/11/2018   HCT 35.6 (L) 11/11/2018   PLT 116 (L) 11/11/2018   GLUCOSE 139 (H) 11/11/2018   ALT 14 11/04/2018   AST 22 11/04/2018   NA 137 11/11/2018   K 4.0 11/11/2018   CL 101 11/11/2018   CREATININE 1.11 11/11/2018   BUN 27 (H) 11/11/2018   CO2 27 11/11/2018   PSA 2.79 11/01/2012   INR 1.5 (H) 11/10/2018   HGBA1C 5.0 11/04/2018   Mild confused Walked in unit     Grace Isaac MD  Beeper [phone] Office [phone] 11/11/2018 6:20 PM

## 2018-11-12 ENCOUNTER — Inpatient Hospital Stay (HOSPITAL_COMMUNITY): Payer: Medicare HMO

## 2018-11-12 LAB — CBC
HCT: 35.8 % — ABNORMAL LOW (ref 39.0–52.0)
Hemoglobin: 12.5 g/dL — ABNORMAL LOW (ref 13.0–17.0)
MCH: 33.5 pg (ref 26.0–34.0)
MCHC: 34.9 g/dL (ref 30.0–36.0)
MCV: 96 fL (ref 80.0–100.0)
Platelets: 126 10*3/uL — ABNORMAL LOW (ref 150–400)
RBC: 3.73 MIL/uL — ABNORMAL LOW (ref 4.22–5.81)
RDW: 11.9 % (ref 11.5–15.5)
WBC: 14.5 10*3/uL — ABNORMAL HIGH (ref 4.0–10.5)
nRBC: 0 % (ref 0.0–0.2)

## 2018-11-12 LAB — BASIC METABOLIC PANEL
Anion gap: 10 (ref 5–15)
BUN: 32 mg/dL — ABNORMAL HIGH (ref 8–23)
CO2: 26 mmol/L (ref 22–32)
Calcium: 8.5 mg/dL — ABNORMAL LOW (ref 8.9–10.3)
Chloride: 102 mmol/L (ref 98–111)
Creatinine, Ser: 1.13 mg/dL (ref 0.61–1.24)
GFR calc Af Amer: >60 mL/min (ref >60)
GFR calc non Af Amer: >60 mL/min (ref >60)
Glucose, Bld: 123 mg/dL — ABNORMAL HIGH (ref 70–99)
Potassium: 4 mmol/L (ref 3.5–5.1)
Sodium: 138 mmol/L (ref 135–145)

## 2018-11-12 MED ORDER — TRAZODONE HCL 50 MG PO TABS
25.0000 mg | ORAL_TABLET | Freq: Every evening | ORAL | Status: DC | PRN
  Administered 2018-11-12 – 2018-11-14 (×4): 25 mg via ORAL
  Filled 2018-11-12 (×4): qty 1

## 2018-11-12 MED ORDER — AMIODARONE HCL 200 MG PO TABS: 200.0000 mg | ORAL_TABLET | Freq: Every day | ORAL | Status: DC

## 2018-11-12 MED ORDER — AMIODARONE HCL 200 MG PO TABS
200.0000 mg | ORAL_TABLET | Freq: Two times a day (BID) | ORAL | Status: DC
  Administered 2018-11-12 – 2018-11-13 (×3): 200 mg via ORAL
  Filled 2018-11-12 (×3): qty 1

## 2018-11-12 MED ORDER — METOPROLOL TARTRATE 12.5 MG HALF TABLET: 12.5000 mg | ORAL_TABLET | Freq: Two times a day (BID) | ORAL | Status: DC | PRN

## 2018-11-12 MED ORDER — AMIODARONE HCL 200 MG PO TABS
200.0000 mg | ORAL_TABLET | ORAL | Status: AC
  Administered 2018-11-12: 200 mg via ORAL
  Filled 2018-11-12: qty 1

## 2018-11-12 NOTE — Progress Notes (Signed)
Patient ID:  WENZLICK, male   DOB: [birth year], 78 y.o.   MRN: [id number] EVENING ROUNDS NOTE :     [address]       [place] [address]             [phone]                 2 Days Post-Op Procedure(s) (LRB): AORTIC VALVE REPLACEMENT (AVR), USING 23MM INSPIRIS (N/A) TRANSESOPHAGEAL ECHOCARDIOGRAM (TEE) (N/A)  Total Length of Stay:  LOS: 4 days  BP (!) (P) 92/56 (BP Location: Right Arm)   Pulse 73   Temp 98.2 F (36.8 C) (Oral)   Resp 18   Ht 5\' 9"  (1.753 m)   Wt 76.9 kg   SpO2 98%   BMI 25.04 kg/m   .Intake/Output      05/01 0701 - 05/02 0700 05/02 0701 - 05/03 0700   P.O. 240    I.V. (mL/kg) 263 (3.4)    Blood     Other     IV Piggyback 200    Total Intake(mL/kg) 703 (9.1)    Urine (mL/kg/hr) 1320 (0.7)    Blood     Chest Tube     Total Output 1320    Net -617         Urine Occurrence 1 x 2 x     . sodium chloride Stopped (11/11/18 1100)  . sodium chloride    . sodium chloride    . lactated ringers Stopped (11/11/18 1858)  . nitroGLYCERIN Stopped (11/10/18 1643)  . phenylephrine (NEO-SYNEPHRINE) Adult infusion Stopped (11/11/18 0700)     Lab Results  Component Value Date   WBC 14.5 (H) 11/12/2018   HGB 12.5 (L) 11/12/2018   HCT 35.8 (L) 11/12/2018   PLT 126 (L) 11/12/2018   GLUCOSE 123 (H) 11/12/2018   ALT 14 11/04/2018   AST 22 11/04/2018   NA 138 11/12/2018   K 4.0 11/12/2018   CL 102 11/12/2018   CREATININE 1.13 11/12/2018   BUN 32 (H) 11/12/2018   CO2 26 11/12/2018   PSA 2.79 11/01/2012   INR 1.5 (H) 11/10/2018   HGBA1C 5.0 11/04/2018   Better day Ambulating around room Mental status cleared    Grace Isaac MD  Beeper [phone] Office [phone] 11/12/2018 4:16 PM

## 2018-11-12 NOTE — Progress Notes (Signed)
Patient ID: Joshua Hess, male   DOB: August 04, 1940, 78 y.o.   MRN: [id number] TCTS DAILY ICU PROGRESS NOTE                   [address]            [place] [address]          [phone]   2 Days Post-Op Procedure(s) (LRB): AORTIC VALVE REPLACEMENT (AVR), USING 23MM INSPIRIS (N/A) TRANSESOPHAGEAL ECHOCARDIOGRAM (TEE) (N/A)  Total Length of Stay:  LOS: 4 days   Subjective: Some confusion during night, much better this am.   Objective: Vital signs in last 24 hours: Temp:  [97.6 F (36.4 C)-98.2 F (36.8 C)] 97.7 F (36.5 C) (05/02 0749) Pulse Rate:  [31-85] 66 (05/02 0800) Cardiac Rhythm: Normal sinus rhythm (05/02 0800) Resp:  [11-22] 14 (05/02 0800) BP: (102-173)/(54-116) 133/69 (05/02 0800) SpO2:  [87 %-100 %] 93 % (05/02 0200) Weight:  [76.9 kg] 76.9 kg (05/02 0337)  Filed Weights   11/10/18 0405 11/11/18 0500 11/12/18 0337  Weight: 77 kg 76.5 kg 76.9 kg    Weight change: 0.4 kg   Hemodynamic parameters for last 24 hours:    Intake/Output from previous day: 05/01 0701 - 05/02 0700 In: 703 [P.O.:240; I.V.:263; IV Piggyback:200] Out: 1320 [Urine:1320]  Intake/Output this shift: No intake/output data recorded.  Current Meds: Scheduled Meds: . acetaminophen  1,000 mg Oral Q6H   Or  . acetaminophen (TYLENOL) oral liquid 160 mg/5 mL  1,000 mg Per Tube Q6H  . aspirin EC  325 mg Oral Daily   Or  . aspirin  324 mg Per Tube Daily  . bisacodyl  10 mg Oral Daily   Or  . bisacodyl  10 mg Rectal Daily  . Chlorhexidine Gluconate Cloth  6 each Topical Daily  . docusate sodium  200 mg Oral Daily  . pantoprazole  40 mg Oral Daily  . sodium chloride flush  3 mL Intravenous Q12H   Continuous Infusions: . sodium chloride Stopped (11/11/18 1100)  . sodium chloride    . sodium chloride    . lactated ringers Stopped (11/11/18 1858)  . nitroGLYCERIN Stopped (11/10/18 1643)  . phenylephrine (NEO-SYNEPHRINE) Adult infusion Stopped (11/11/18 0700)   PRN  Meds:.sodium chloride, ketorolac, morphine injection, ondansetron (ZOFRAN) IV, oxyCODONE, sodium chloride flush, sodium chloride flush, traMADol, traZODone  General appearance: alert, cooperative, appears stated age and no distress Neurologic: intact Heart: regular rate and rhythm, S1, S2 normal, no murmur, click, rub or gallop Lungs: diminished breath sounds bibasilar Abdomen: soft, non-tender; bowel sounds normal; no masses,  no organomegaly Extremities: extremities normal, atraumatic, no cyanosis or edema and Homans sign is negative, no sign of DVT Wound: dressing inatct sternum  stable   Lab Results: CBC: Recent Labs    11/11/18 1558 11/12/18 0246  WBC 14.7* 14.5*  HGB 12.4* 12.5*  HCT 35.6* 35.8*  PLT 116* 126*   BMET:  Recent Labs    11/11/18 1558 11/12/18 0246  NA 137 138  K 4.0 4.0  CL 101 102  CO2 27 26  GLUCOSE 139* 123*  BUN 27* 32*  CREATININE 1.11 1.13  CALCIUM 8.2* 8.5*    CMET: Lab Results  Component Value Date   WBC 14.5 (H) 11/12/2018   HGB 12.5 (L) 11/12/2018   HCT 35.8 (L) 11/12/2018   PLT 126 (L) 11/12/2018   GLUCOSE 123 (H) 11/12/2018   ALT 14 11/04/2018   AST 22 11/04/2018  NA 138 11/12/2018   K 4.0 11/12/2018   CL 102 11/12/2018   CREATININE 1.13 11/12/2018   BUN 32 (H) 11/12/2018   CO2 26 11/12/2018   PSA 2.79 11/01/2012   INR 1.5 (H) 11/10/2018   HGBA1C 5.0 11/04/2018      PT/INR:  Recent Labs    11/10/18 1210  LABPROT 17.8*  INR 1.5*   Radiology: Dg Chest Port 1 View  Result Date: 11/12/2018 CLINICAL DATA:  Aortic valve replacement EXAM: PORTABLE CHEST 1 VIEW COMPARISON:  11/11/2018 FINDINGS: Right IJ line with tip at the SVC. Improving aeration. Stable cardiomegaly with aortic valve replacement. She some chest drains have been removed. Mild atelectasis. No edema or effusion. Possible trace right apically pneumothorax. IMPRESSION: 1. Significantly improved aeration. 2. Chest tube removal without large or enlarging  pneumothorax. Electronically Signed   By: Monte Fantasia M.D.   On: 11/12/2018 07:20     Assessment/Plan: S/P Procedure(s) (LRB): AORTIC VALVE REPLACEMENT (AVR), USING 23MM INSPIRIS (N/A) TRANSESOPHAGEAL ECHOCARDIOGRAM (TEE) (N/A) Mobilize d/c tubes/lines D/c central line Poss home Monday In sinus now, 60's  Less ectopy   Grace Isaac 11/12/2018 9:06 AM

## 2018-11-12 NOTE — Progress Notes (Signed)
Patient extremely agitated, restless, anxious.  States that he wants the foley out, says its hurting him and he feels like he has to "pee".  Foley removed.  Also states he feels hot, can't sleep.  Asked if he was on any medication that helps with sleep and he stated that he was on trazadone.  Notified Dr. Roxy Horseman, orders received.

## 2018-11-12 NOTE — Plan of Care (Signed)
  Problem: Cardiac: Goal: Will achieve and/or maintain hemodynamic stability Outcome: Progressing   Problem: Respiratory: Goal: Respiratory status will improve Outcome: Progressing   Problem: Skin Integrity: Goal: Wound healing without signs and symptoms of infection Outcome: Progressing

## 2018-11-12 NOTE — Progress Notes (Signed)
  Amiodarone Drug - Drug Interaction Consult Note  Recommendations: No medication changes - no drug interactions  Amiodarone is metabolized by the cytochrome P450 system and therefore has the potential to cause many drug interactions. Amiodarone has an average plasma half-life of 50 days (range 20 to 100 days).   There is potential for drug interactions to occur several weeks or months after stopping treatment and the onset of drug interactions may be slow after initiating amiodarone.   []  Statins: Increased risk of myopathy. Simvastatin- restrict dose to 20mg  daily. Other statins: counsel patients to report any muscle pain or weakness immediately.  []  Anticoagulants: Amiodarone can increase anticoagulant effect. Consider warfarin dose reduction. Patients should be monitored closely and the dose of anticoagulant altered accordingly, remembering that amiodarone levels take several weeks to stabilize.  []  Antiepileptics: Amiodarone can increase plasma concentration of phenytoin, the dose should be reduced. Note that small changes in phenytoin dose can result in large changes in levels. Monitor patient and counsel on signs of toxicity.  []  Beta blockers: increased risk of bradycardia, AV block and myocardial depression. Sotalol - avoid concomitant use.  []   Calcium channel blockers (diltiazem and verapamil): increased risk of bradycardia, AV block and myocardial depression.  []   Cyclosporine: Amiodarone increases levels of cyclosporine. Reduced dose of cyclosporine is recommended.  []  Digoxin dose should be halved when amiodarone is started.  []  Diuretics: increased risk of cardiotoxicity if hypokalemia occurs.  []  Oral hypoglycemic agents (glyburide, glipizide, glimepiride): increased risk of hypoglycemia. Patient's glucose levels should be monitored closely when initiating amiodarone therapy.   []  Drugs that prolong the QT interval:  Torsades de pointes risk may be increased with  concurrent use - avoid if possible.  Monitor QTc, also keep magnesium/potassium WNL if concurrent therapy can't be avoided. Marland Kitchen Antibiotics: e.g. fluoroquinolones, erythromycin. . Antiarrhythmics: e.g. quinidine, procainamide, disopyramide, sotalol. . Antipsychotics: e.g. phenothiazines, haloperidol.  . Lithium, tricyclic antidepressants, and methadone.   Bonnita Nasuti Pharm.D. CPP, [place] Pharmacist [phone] 11/12/2018 6:34 PM

## 2018-11-13 LAB — MAGNESIUM: Magnesium: 1.9 mg/dL (ref 1.7–2.4)

## 2018-11-13 LAB — TSH: TSH: 1.132 u[IU]/mL (ref 0.350–4.500)

## 2018-11-13 MED ORDER — ASPIRIN EC 325 MG PO TBEC
325.0000 mg | DELAYED_RELEASE_TABLET | Freq: Every day | ORAL | Status: DC
  Administered 2018-11-13: 325 mg via ORAL
  Filled 2018-11-13: qty 1

## 2018-11-13 MED ORDER — MOVING RIGHT ALONG BOOK
Freq: Once | Status: AC
  Administered 2018-11-13: 09:00:00
  Filled 2018-11-13: qty 1

## 2018-11-13 MED ORDER — SODIUM CHLORIDE 0.9% FLUSH
3.0000 mL | Freq: Two times a day (BID) | INTRAVENOUS | Status: DC
  Administered 2018-11-13: 6 mL via INTRAVENOUS
  Administered 2018-11-13 – 2018-11-14 (×3): 3 mL via INTRAVENOUS

## 2018-11-13 MED ORDER — ONDANSETRON HCL 4 MG PO TABS: 4.0000 mg | ORAL_TABLET | Freq: Four times a day (QID) | ORAL | Status: DC | PRN

## 2018-11-13 MED ORDER — SODIUM CHLORIDE 0.9 % IV SOLN: 250.0000 mL | INTRAVENOUS | Status: DC | PRN

## 2018-11-13 MED ORDER — ENOXAPARIN SODIUM 30 MG/0.3ML ~~LOC~~ SOLN
30.0000 mg | SUBCUTANEOUS | Status: DC
  Administered 2018-11-13 – 2018-11-15 (×3): 30 mg via SUBCUTANEOUS
  Filled 2018-11-13 (×3): qty 0.3

## 2018-11-13 MED ORDER — ONDANSETRON HCL 4 MG/2ML IJ SOLN: 4.0000 mg | Freq: Four times a day (QID) | INTRAMUSCULAR | Status: DC | PRN

## 2018-11-13 MED ORDER — GUAIFENESIN ER 600 MG PO TB12: 600.0000 mg | ORAL_TABLET | Freq: Two times a day (BID) | ORAL | Status: DC | PRN

## 2018-11-13 MED ORDER — METOPROLOL TARTRATE 12.5 MG HALF TABLET
12.5000 mg | ORAL_TABLET | Freq: Two times a day (BID) | ORAL | Status: DC
  Administered 2018-11-13: 12.5 mg via ORAL
  Filled 2018-11-13 (×2): qty 1

## 2018-11-13 MED ORDER — ACETAMINOPHEN 325 MG PO TABS: 650.0000 mg | ORAL_TABLET | Freq: Four times a day (QID) | ORAL | Status: DC | PRN

## 2018-11-13 MED ORDER — SODIUM CHLORIDE 0.9% FLUSH: 3.0000 mL | INTRAVENOUS | Status: DC | PRN

## 2018-11-13 MED ORDER — BISACODYL 10 MG RE SUPP: 10.0000 mg | Freq: Every day | RECTAL | Status: DC | PRN

## 2018-11-13 MED ORDER — BISACODYL 5 MG PO TBEC: 10.0000 mg | DELAYED_RELEASE_TABLET | Freq: Every day | ORAL | Status: DC | PRN

## 2018-11-13 NOTE — Progress Notes (Signed)
Patient ID: Joshua Hess, male   DOB: 02-21-1941, 78 y.o.   MRN: [id number] TCTS DAILY ICU PROGRESS NOTE                   [address]            [place] [address]          [phone]   3 Days Post-Op Procedure(s) (LRB): AORTIC VALVE REPLACEMENT (AVR), USING 23MM INSPIRIS (N/A) TRANSESOPHAGEAL ECHOCARDIOGRAM (TEE) (N/A)  Total Length of Stay:  LOS: 5 days   Subjective: Patient up ambulating this morning.  Had bowel movement this morning.  Patient still wants to go home today.  Brief episodes of atrial fib yesterday.  This morning in sinus rhythm with PACs.  Started on low-dose beta-blocker and p.o. amiodarone yesterday.   Objective: Vital signs in last 24 hours: Temp:  [97.6 F (36.4 C)-98.3 F (36.8 C)] 97.6 F (36.4 C) (05/03 0719) Pulse Rate:  [73-106] 106 (05/02 2200) Cardiac Rhythm: Atrial fibrillation (05/02 2000) Resp:  [13-22] 15 (05/03 0700) BP: (92-152)/(52-83) 110/77 (05/03 0700) SpO2:  [94 %-100 %] 97 % (05/03 0545) Weight:  [75 kg] 75 kg (05/03 0431)  Filed Weights   11/11/18 0500 11/12/18 0337 11/13/18 0431  Weight: 76.5 kg 76.9 kg 75 kg    Weight change: -1.92 kg   Hemodynamic parameters for last 24 hours:    Intake/Output from previous day: 05/02 0701 - 05/03 0700 In: 840 [P.O.:840] Out: -   Intake/Output this shift: No intake/output data recorded.  Current Meds: Scheduled Meds: . acetaminophen  1,000 mg Oral Q6H   Or  . acetaminophen (TYLENOL) oral liquid 160 mg/5 mL  1,000 mg Per Tube Q6H  . amiodarone  200 mg Oral BID   Followed by  . [START ON 11/20/2018] amiodarone  200 mg Oral Daily  . aspirin EC  325 mg Oral Daily   Or  . aspirin  324 mg Per Tube Daily  . bisacodyl  10 mg Oral Daily   Or  . bisacodyl  10 mg Rectal Daily  . Chlorhexidine Gluconate Cloth  6 each Topical Daily  . docusate sodium  200 mg Oral Daily  . pantoprazole  40 mg Oral Daily  . sodium chloride flush  3 mL Intravenous Q12H   Continuous  Infusions: . sodium chloride Stopped (11/11/18 1100)  . sodium chloride    . sodium chloride    . lactated ringers Stopped (11/11/18 1858)  . nitroGLYCERIN Stopped (11/10/18 1643)  . phenylephrine (NEO-SYNEPHRINE) Adult infusion Stopped (11/11/18 0700)   PRN Meds:.sodium chloride, ketorolac, metoprolol tartrate, morphine injection, ondansetron (ZOFRAN) IV, oxyCODONE, sodium chloride flush, sodium chloride flush, traMADol, traZODone  General appearance: alert, cooperative and no distress Neurologic: intact Heart: regular rate and rhythm, S1, S2 normal, no murmur, click, rub or gallop Lungs: clear to auscultation bilaterally Abdomen: soft, non-tender; bowel sounds normal; no masses,  no organomegaly Extremities: extremities normal, atraumatic, no cyanosis or edema and Homans sign is negative, no sign of DVT Wound: Sternum stable and healing  Lab Results: CBC: Recent Labs    11/11/18 1558 11/12/18 0246  WBC 14.7* 14.5*  HGB 12.4* 12.5*  HCT 35.6* 35.8*  PLT 116* 126*   BMET:  Recent Labs    11/11/18 1558 11/12/18 0246  NA 137 138  K 4.0 4.0  CL 101 102  CO2 27 26  GLUCOSE 139* 123*  BUN 27* 32*  CREATININE 1.11 1.13  CALCIUM 8.2* 8.5*  CMET: Lab Results  Component Value Date   WBC 14.5 (H) 11/12/2018   HGB 12.5 (L) 11/12/2018   HCT 35.8 (L) 11/12/2018   PLT 126 (L) 11/12/2018   GLUCOSE 123 (H) 11/12/2018   ALT 14 11/04/2018   AST 22 11/04/2018   NA 138 11/12/2018   K 4.0 11/12/2018   CL 102 11/12/2018   CREATININE 1.13 11/12/2018   BUN 32 (H) 11/12/2018   CO2 26 11/12/2018   TSH 1.132 11/13/2018   PSA 2.79 11/01/2012   INR 1.5 (H) 11/10/2018   HGBA1C 5.0 11/04/2018      PT/INR:  Recent Labs    11/10/18 1210  LABPROT 17.8*  INR 1.5*   Radiology: No results found.   Assessment/Plan: S/P Procedure(s) (LRB): AORTIC VALVE REPLACEMENT (AVR), USING 23MM INSPIRIS (N/A) TRANSESOPHAGEAL ECHOCARDIOGRAM (TEE) (N/A) Mobilize Diuresis Plan for  transfer to step-down: see transfer orders Monitor heart rhythm another 24 hours, possible discharge 24 to 48 hours     Grace Isaac 11/13/2018 8:01 AM

## 2018-11-13 NOTE — Progress Notes (Signed)
Patient arrived to 4E room 10 at this time. Telemetry applied and CCMD notified. V/s and assessment done. Patient oriented to room and how to call nurse with any needs.   Emelda Fear, RN

## 2018-11-13 NOTE — Plan of Care (Signed)
  Problem: Education: Goal: Knowledge of General Education information will improve Description Including pain rating scale, medication(s)/side effects and non-pharmacologic comfort measures Outcome: Progressing   Problem: Health Behavior/Discharge Planning: Goal: Ability to manage health-related needs will improve Outcome: Progressing   Problem: Clinical Measurements: Goal: Ability to maintain clinical measurements within normal limits will improve Outcome: Progressing Goal: Will remain free from infection Outcome: Progressing Goal: Diagnostic test results will improve Outcome: Progressing Goal: Respiratory complications will improve Outcome: Progressing Goal: Cardiovascular complication will be avoided Outcome: Progressing   Problem: Activity: Goal: Risk for activity intolerance will decrease Outcome: Progressing   Problem: Nutrition: Goal: Adequate nutrition will be maintained Outcome: Progressing   Problem: Coping: Goal: Level of anxiety will decrease Outcome: Progressing   Problem: Elimination: Goal: Will not experience complications related to bowel motility Outcome: Progressing   Problem: Pain Managment: Goal: General experience of comfort will improve Outcome: Progressing   Problem: Safety: Goal: Ability to remain free from injury will improve Outcome: Progressing   Problem: Skin Integrity: Goal: Risk for impaired skin integrity will decrease Outcome: Progressing   Problem: Education: Goal: Will demonstrate proper wound care and an understanding of methods to prevent future damage Outcome: Progressing Goal: Knowledge of disease or condition will improve Outcome: Progressing Goal: Knowledge of the prescribed therapeutic regimen will improve Outcome: Progressing   Problem: Cardiac: Goal: Will achieve and/or maintain hemodynamic stability Outcome: Progressing   Problem: Clinical Measurements: Goal: Postoperative complications will be avoided or  minimized Outcome: Progressing   Problem: Respiratory: Goal: Respiratory status will improve Outcome: Progressing   Problem: Skin Integrity: Goal: Wound healing without signs and symptoms of infection Outcome: Progressing Goal: Risk for impaired skin integrity will decrease Outcome: Progressing

## 2018-11-14 ENCOUNTER — Inpatient Hospital Stay (HOSPITAL_COMMUNITY): Payer: Medicare HMO

## 2018-11-14 DIAGNOSIS — Z952 Presence of prosthetic heart valve: Secondary | ICD-10-CM

## 2018-11-14 LAB — CBC
HCT: 31.7 % — ABNORMAL LOW (ref 39.0–52.0)
Hemoglobin: 11 g/dL — ABNORMAL LOW (ref 13.0–17.0)
MCH: 33.7 pg (ref 26.0–34.0)
MCHC: 34.7 g/dL (ref 30.0–36.0)
MCV: 97.2 fL (ref 80.0–100.0)
Platelets: 134 10*3/uL — ABNORMAL LOW (ref 150–400)
RBC: 3.26 MIL/uL — ABNORMAL LOW (ref 4.22–5.81)
RDW: 11.9 % (ref 11.5–15.5)
WBC: 8.3 10*3/uL (ref 4.0–10.5)
nRBC: 0 % (ref 0.0–0.2)

## 2018-11-14 LAB — BASIC METABOLIC PANEL
Anion gap: 7 (ref 5–15)
BUN: 26 mg/dL — ABNORMAL HIGH (ref 8–23)
CO2: 30 mmol/L (ref 22–32)
Calcium: 8.2 mg/dL — ABNORMAL LOW (ref 8.9–10.3)
Chloride: 104 mmol/L (ref 98–111)
Creatinine, Ser: 0.93 mg/dL (ref 0.61–1.24)
GFR calc Af Amer: >60 mL/min (ref >60)
GFR calc non Af Amer: >60 mL/min (ref >60)
Glucose, Bld: 106 mg/dL — ABNORMAL HIGH (ref 70–99)
Potassium: 3.6 mmol/L (ref 3.5–5.1)
Sodium: 141 mmol/L (ref 135–145)

## 2018-11-14 MED ORDER — POTASSIUM CHLORIDE CRYS ER 20 MEQ PO TBCR
40.0000 meq | EXTENDED_RELEASE_TABLET | Freq: Once | ORAL | Status: AC
  Administered 2018-11-14: 40 meq via ORAL
  Filled 2018-11-14: qty 2

## 2018-11-14 MED ORDER — METOPROLOL TARTRATE 25 MG PO TABS
25.0000 mg | ORAL_TABLET | Freq: Two times a day (BID) | ORAL | Status: DC
  Administered 2018-11-14 – 2018-11-15 (×3): 25 mg via ORAL
  Filled 2018-11-14 (×3): qty 1

## 2018-11-14 MED ORDER — ASPIRIN EC 81 MG PO TBEC
81.0000 mg | DELAYED_RELEASE_TABLET | Freq: Every day | ORAL | Status: DC
  Administered 2018-11-14 – 2018-11-15 (×2): 81 mg via ORAL
  Filled 2018-11-14 (×2): qty 1

## 2018-11-14 NOTE — Progress Notes (Signed)
4 Days Post-Op Procedure(s) (LRB): AORTIC VALVE REPLACEMENT (AVR), USING 23MM INSPIRIS (N/A) TRANSESOPHAGEAL ECHOCARDIOGRAM (TEE) (N/A) Subjective: Complains of persistent nausea. Not sure when it started. Has had BM.  Objective: Vital signs in last 24 hours: Temp:  [97.7 F (36.5 C)-98.1 F (36.7 C)] 98 F (36.7 C) (05/04 0312) Pulse Rate:  [62-77] 66 (05/04 0312) Cardiac Rhythm: Normal sinus rhythm (05/03 2036) Resp:  [11-25] 14 (05/04 0312) BP: (94-144)/(57-79) 144/71 (05/04 0312) SpO2:  [97 %-100 %] 97 % (05/04 0312) Weight:  [75.1 kg] 75.1 kg (05/04 0312)  Hemodynamic parameters for last 24 hours:    Intake/Output from previous day: 05/03 0701 - 05/04 0700 In: 480 [P.O.:480] Out: -  Intake/Output this shift: No intake/output data recorded.  General appearance: alert and cooperative Neurologic: intact Heart: regular rate and rhythm, S1, S2 normal, no murmur, click, rub or gallop Lungs: clear to auscultation bilaterally Abdomen: soft, non-tender; bowel sounds normal Extremities: extremities normal, atraumatic, no cyanosis or edema Wound: dressing dry  Lab Results: Recent Labs    11/12/18 0246 11/14/18 0336  WBC 14.5* 8.3  HGB 12.5* 11.0*  HCT 35.8* 31.7*  PLT 126* 134*   BMET:  Recent Labs    11/12/18 0246 11/14/18 0336  NA 138 141  K 4.0 3.6  CL 102 104  CO2 26 30  GLUCOSE 123* 106*  BUN 32* 26*  CREATININE 1.13 0.93  CALCIUM 8.5* 8.2*    PT/INR: No results for input(s): LABPROT, INR in the last 72 hours. ABG    Component Value Date/Time   PHART 7.336 (L) 11/10/2018 1712   HCO3 25.0 11/10/2018 1712   TCO2 26 11/10/2018 1712   ACIDBASEDEF 1.0 11/10/2018 1712   O2SAT 99.0 11/10/2018 1712   CBG (last 3)  No results for input(s): GLUCAP in the last 72 hours.  Assessment/Plan: S/P Procedure(s) (LRB): AORTIC VALVE REPLACEMENT (AVR), USING 23MM INSPIRIS (N/A) TRANSESOPHAGEAL ECHOCARDIOGRAM (TEE) (N/A)  POD 4 Hemodynamically stable in sinus  rhythm. He has some brief atrial fib over the weekend and was started on oral amio. His persistent nausea may be related to this so will stop it and increase Lopressor to 25 bid.  Wt is below preop.  K+ 3.6 this am and gave a dose of Kdur.  Remove pacing wires.  Persistent nausea: probably related to meds so will stop amio and ultram. Use Tylenol for pain.   Plan home tomorrow if nausea better.     LOS: 6 days    Gaye Pollack 11/14/2018

## 2018-11-14 NOTE — Progress Notes (Signed)
Pt educated on the plan of care for him today - pacing wire removal in AM after breakfast if no arrhythmias overnight. Pt maintained NSR-SB throughout the night with no complaints of pain. Pt eager to get discharged. Will continue to monitor. Lajoyce Corners, RN

## 2018-11-14 NOTE — Progress Notes (Signed)
Pacing wires removed per order.  Tolerated well.  Wires intact upon removal.  No drainage noted.  Vitals taken prior to removal and q15 mins.

## 2018-11-14 NOTE — Progress Notes (Signed)
Cardiac Rehab Advisory Cardiac Rehab Phase I is not seeing pts face to face at this time due to Covid 19 restrictions. Ambulation is occurring through nursing, PT, and mobility teams. We will help facilitate that process as needed. We are calling pts in their rooms and discussing education. We will then deliver education materials to pts RN for delivery to pt.   [phone] Called pt and educated by phone. Discussed staying in the tube and sternal precautions, incision care, walking for exercise, gave heart healthy diet and discussed CRP 2. Referring to [place] CRP 2. Pt has been walking in hallway without walker. Encouraged pt to increase ex slowly. Encouraged pt to watch discharge video. Pt voiced understanding. Graylon Good RN BSN 11/14/2018 2:48 PM

## 2018-11-14 NOTE — Care Management Important Message (Signed)
Important Message  Patient Details  Name: Joshua Hess MRN: [id number] Date of Birth: March 29, 1941   Medicare Important Message Given:  Yes    Orbie Pyo 11/14/2018, 3:49 PM

## 2018-11-15 LAB — POCT I-STAT 7, (LYTES, BLD GAS, ICA,H+H)
Acid-Base Excess: 6 mmol/L — ABNORMAL HIGH (ref 0.0–2.0)
Bicarbonate: 32.2 mmol/L — ABNORMAL HIGH (ref 20.0–28.0)
Calcium, Ion: 1.21 mmol/L (ref 1.15–1.40)
HCT: 38 % — ABNORMAL LOW (ref 39.0–52.0)
Hemoglobin: 12.9 g/dL — ABNORMAL LOW (ref 13.0–17.0)
O2 Saturation: 100 %
Potassium: 4.7 mmol/L (ref 3.5–5.1)
Sodium: 139 mmol/L (ref 135–145)
TCO2: 34 mmol/L — ABNORMAL HIGH (ref 22–32)
pCO2 arterial: 54.4 mmHg — ABNORMAL HIGH (ref 32.0–48.0)
pH, Arterial: 7.38 (ref 7.350–7.450)
pO2, Arterial: 511 mmHg — ABNORMAL HIGH (ref 83.0–108.0)

## 2018-11-15 LAB — POCT I-STAT 4, (NA,K, GLUC, HGB,HCT)
Glucose, Bld: 99 mg/dL (ref 70–99)
HCT: 37 % — ABNORMAL LOW (ref 39.0–52.0)
Hemoglobin: 12.6 g/dL — ABNORMAL LOW (ref 13.0–17.0)
Potassium: 4.7 mmol/L (ref 3.5–5.1)
Sodium: 138 mmol/L (ref 135–145)

## 2018-11-15 MED ORDER — TRAMADOL HCL 50 MG PO TABS: 50.0000 mg | ORAL_TABLET | Freq: Four times a day (QID) | ORAL | 0 refills | Status: AC | PRN

## 2018-11-15 MED ORDER — ASPIRIN 81 MG PO TBEC: 81.0000 mg | DELAYED_RELEASE_TABLET | Freq: Every day | ORAL | Status: DC

## 2018-11-15 MED ORDER — METOPROLOL TARTRATE 25 MG PO TABS: 25.0000 mg | ORAL_TABLET | Freq: Two times a day (BID) | ORAL | 1 refills | Status: DC

## 2018-11-15 MED FILL — Sodium Chloride IV Soln 0.9%: INTRAVENOUS | Qty: 2000 | Status: AC

## 2018-11-15 MED FILL — Sodium Bicarbonate IV Soln 8.4%: INTRAVENOUS | Qty: 50 | Status: AC

## 2018-11-15 MED FILL — Mannitol IV Soln 20%: INTRAVENOUS | Qty: 500 | Status: AC

## 2018-11-15 MED FILL — Heparin Sodium (Porcine) Inj 1000 Unit/ML: INTRAMUSCULAR | Qty: 10 | Status: AC

## 2018-11-15 MED FILL — Electrolyte-R (PH 7.4) Solution: INTRAVENOUS | Qty: 3000 | Status: AC

## 2018-11-15 MED FILL — Lidocaine HCl(Cardiac) IV PF Soln Pref Syr 100 MG/5ML (2%): INTRAVENOUS | Qty: 5 | Status: AC

## 2018-11-15 NOTE — Discharge Instructions (Signed)
Aortic Valve Replacement, Care After  Refer to this sheet in the next few weeks. These instructions provide you with information about caring for yourself after your procedure. Your health care provider may also give you more specific instructions. Your treatment has been planned according to current medical practices, but problems sometimes occur. Call your health care provider if you have any problems or questions after your procedure.  What can I expect after the procedure?  After the procedure, it is common to have:  · Pain around your incision area.  · A small amount of blood or clear fluid coming from your incision.  Follow these instructions at home:  Eating and drinking         · Follow instructions from your health care provider about eating or drinking restrictions.  ? Limit alcohol intake to no more than 1 drink per day for nonpregnant women and 2 drinks per day for men. One drink equals 12 oz of beer, 5 oz of wine, or 1½ oz of hard liquor.  ? Limit how much caffeine you drink. Caffeine can affect your heart's rate and rhythm.  · Drink enough fluid to keep your urine clear or pale yellow.  · Eat a heart-healthy diet. This should include plenty of fresh fruits and vegetables. If you eat meat, it should be lean cuts. Avoid foods that are:  ? High in salt, saturated fat, or sugar.  ? Canned or highly processed.  ? Fried.  Activity  · Return to your normal activities as told by your health care provider. Ask your health care provider what activities are safe for you.  · Exercise regularly once you have recovered, as told by your health care provider.  · Avoid sitting for more than 2 hours at a time without moving. Get up and move around at least once every 1-2 hours. This helps to prevent blood clots in the legs.  · Do not lift anything that is heavier than 10 lb (4.5 kg) until your health care provider approves.  · Avoid pushing or pulling things with your arms until your health care provider approves. This  includes pulling on handrails to help you climb stairs.  Incision care    · Follow instructions from your health care provider about how to take care of your incision. Make sure you:  ? Wash your hands with soap and water before you change your bandage (dressing). If soap and water are not available, use hand sanitizer.  ? Change your dressing as told by your health care provider.  ? Leave stitches (sutures), skin glue, or adhesive strips in place. These skin closures may need to stay in place for 2 weeks or longer. If adhesive strip edges start to loosen and curl up, you may trim the loose edges. Do not remove adhesive strips completely unless your health care provider tells you to do that.  · Check your incision area every day for signs of infection. Check for:  ? More redness, swelling, or pain.  ? More fluid or blood.  ? Warmth.  ? Pus or a bad smell.  Medicines  · Take over-the-counter and prescription medicines only as told by your health care provider.  · If you were prescribed an antibiotic medicine, take it as told by your health care provider. Do not stop taking the antibiotic even if you start to feel better.  Travel  · Avoid airplane travel for as long as told by your health care provider.  · When   your health care provider approves.  Do not drive or operate heavy machinery while taking prescription pain medicine. Lifestyle  Do not use any tobacco products, such as cigarettes, chewing tobacco, or e-cigarettes. If you need help quitting, ask your health care provider.  Resume sexual activity as told by your health care provider. Do not use medicines for erectile dysfunction unless your health care provider approves, if this applies.  Work with your health care provider to keep your  blood pressure and cholesterol under control, and to manage any other heart conditions that you have.  Maintain a healthy weight. General instructions  Do not take baths, swim, or use a hot tub until your health care provider approves.You may shower  Do not strain to have a bowel movement.  Avoid crossing your legs while sitting down.  Check your temperature every day for a fever. A fever may be a sign of infection.  If you are a woman and you plan to become pregnant, talk with your health care provider before you become pregnant.  Wear compression stockings if your health care provider instructs you to do this. These stockings help to prevent blood clots and reduce swelling in your legs.  Tell all health care providers who care for you that you have an artificial (prosthetic) aortic valve. If you have or have had heart disease or endocarditis, tell all health care providers about these conditions as well.  Keep all follow-up visits as told by your health care provider. This is important. Contact a health care provider if:  You develop a skin rash.  You experience sudden, unexplained changes in your weight.  You have more redness, swelling, or pain around your incision.  You have more fluid or blood coming from your incision.  Your incision feels warm to the touch.  You have pus or a bad smell coming from your incision.  You have a fever. Get help right away if:  You develop chest pain that is different from the pain coming from your incision.  You develop shortness of breath or difficulty breathing.  You start to feel light-headed. These symptoms may represent a serious problem that is an emergency. Do not wait to see if the symptoms will go away. Get medical help right away. Call your local emergency services (911 in the [place] Do not drive yourself to the hospital. This information is not intended to replace advice given to you by your health care provider. Make sure you  discuss any questions you have with your health care provider. Document Released: 01/15/2005 Document Revised: 10/29/2016 Document Reviewed: 06/02/2015 Elsevier Interactive Patient Education  2019 [employer].

## 2018-11-15 NOTE — Progress Notes (Addendum)
[address]       [place], [address]             [phone]      5 Days Post-Op Procedure(s) (LRB): AORTIC VALVE REPLACEMENT (AVR), USING 23MM INSPIRIS (N/A) TRANSESOPHAGEAL ECHOCARDIOGRAM (TEE) (N/A) Subjective: Feels well, nausea resolved  Objective: Vital signs in last 24 hours: Temp:  [97.7 F (36.5 C)-98.6 F (37 C)] 98 F (36.7 C) (05/05 0515) Pulse Rate:  [61-69] 62 (05/05 0515) Cardiac Rhythm: Normal sinus rhythm (05/04 1925) Resp:  [15-20] 15 (05/05 0515) BP: (126-146)/(62-77) 141/74 (05/05 0515) SpO2:  [96 %-100 %] 100 % (05/05 0515) Weight:  [75.3 kg] 75.3 kg (05/05 0515)  Hemodynamic parameters for last 24 hours:    Intake/Output from previous day: 05/04 0701 - 05/05 0700 In: 120 [P.O.:120] Out: -  Intake/Output this shift: No intake/output data recorded.  General appearance: alert, cooperative and no distress Heart: regular rate and rhythm Lungs: clear to auscultation bilaterally Abdomen: benign Extremities: trace edema Wound: incis healing well  Lab Results: Recent Labs    11/14/18 0336  WBC 8.3  HGB 11.0*  HCT 31.7*  PLT 134*   BMET:  Recent Labs    11/14/18 0336  NA 141  K 3.6  CL 104  CO2 30  GLUCOSE 106*  BUN 26*  CREATININE 0.93  CALCIUM 8.2*    PT/INR: No results for input(s): LABPROT, INR in the last 72 hours. ABG    Component Value Date/Time   PHART 7.336 (L) 11/10/2018 1712   HCO3 25.0 11/10/2018 1712   TCO2 26 11/10/2018 1712   ACIDBASEDEF 1.0 11/10/2018 1712   O2SAT 99.0 11/10/2018 1712   CBG (last 3)  No results for input(s): GLUCAP in the last 72 hours.  Meds Scheduled Meds: . aspirin EC  81 mg Oral Daily  . enoxaparin (LOVENOX) injection  30 mg Subcutaneous Q24H  . metoprolol tartrate  25 mg Oral BID  . sodium chloride flush  3 mL Intravenous Q12H   Continuous Infusions: . sodium chloride     PRN Meds:.sodium chloride, acetaminophen, ondansetron **OR** ondansetron (ZOFRAN) IV,  sodium chloride flush, traZODone  Xrays Dg Chest 2 View  Result Date: 11/14/2018 CLINICAL DATA:  78 year old male with aortic valve replacement EXAM: CHEST - 2 VIEW COMPARISON:  11/12/2018, 10/24/2018 FINDINGS: Cardiomediastinal silhouette within normal limits. Surgical changes of median sternotomy and aortic valve repair. Interval removal of right IJ central line. Adequate lung volumes with blunting of the costophrenic sulcus on the lateral view and blunting of the right costophrenic and cardiophrenic angle. Blunting of the left costophrenic angle on the frontal. No confluent airspace disease or pneumothorax. No interlobular septal thickening. No displaced fracture. Epicardial pacing leads remain in place. IMPRESSION: Trace bilateral pleural effusions with no overt edema. Epicardial pacing leads remain. Surgical changes of median sternotomy and CABG. Interval removal of right IJ catheter. Electronically Signed   By: Corrie Mckusick D.O.   On: 11/14/2018 08:05    Assessment/Plan: S/P Procedure(s) (LRB): AORTIC VALVE REPLACEMENT (AVR), USING 23MM INSPIRIS (N/A) TRANSESOPHAGEAL ECHOCARDIOGRAM (TEE) (N/A) Plan for discharge: see discharge orders hemodyn stable in sinus rhythm, SBP 130's- 140's, occ PVC sats good on RA No new labs or Xrays   LOS: 7 days    [facility] 11/15/2018 Pager [phone]   Chart reviewed, patient examined, agree with above. Feels much better, nausea resolved.  Encouraged to use Tylenol or Ibuprofen for pain at home. He can  go home today.

## 2018-11-15 NOTE — Progress Notes (Signed)
Patient given discharge instructions, medication list  And follow up appointments.Patient prescriptions sent to personal pharmacy. Patient verbalized understanding. IV and tele were dcd. Will discharge home as ordered. Transported to exit with nursing staff and wheel chair. Nelda Bucks, Bettina Gavia RN

## 2018-11-16 MED FILL — Magnesium Sulfate Inj 50%: INTRAMUSCULAR | Qty: 10 | Status: AC

## 2018-11-16 MED FILL — Thrombin (Recombinant) For Soln 20000 Unit: CUTANEOUS | Qty: 1 | Status: AC

## 2018-11-16 MED FILL — Heparin Sodium (Porcine) Inj 1000 Unit/ML: INTRAMUSCULAR | Qty: 30 | Status: AC

## 2018-11-16 MED FILL — Potassium Chloride Inj 2 mEq/ML: INTRAVENOUS | Qty: 40 | Status: AC

## 2018-11-21 ENCOUNTER — Other Ambulatory Visit: Payer: Self-pay

## 2018-11-21 ENCOUNTER — Telehealth: Payer: Self-pay | Admitting: Physician Assistant

## 2018-11-21 ENCOUNTER — Telehealth: Payer: Self-pay | Admitting: Cardiovascular Disease

## 2018-11-21 NOTE — Telephone Encounter (Signed)
° °  Patient called with concerns about not being able to see Dr Angelena Form for his f/u visit from the hospital. Requesting to speak with nurse Fraser Din. Patient will need to be followed by Dr Sallyanne Kuster per  the [employer].  Patient declined appt at the [place] office, he was scheduled for appointment with APP at [facility]. This appointment was made prior to Dr Angelena Form sending a response to patient.   The appointment was not changed back to [facility] at this time to avoid confusion for the patient.

## 2018-11-21 NOTE — Telephone Encounter (Signed)
Patient wishes to switch from Dr.Croitoru to Dr. Angelena Form. Explained to patient that Dr. Angelena Form would follow him regarding his TAVR procedure but he was not accepting new patients at this time. I will route this message to Dr. Angelena Form and Dr. Sallyanne Kuster.

## 2018-11-21 NOTE — Telephone Encounter (Signed)
I spoke with pt who reports he is aware of  Video appointment with Melina Copa, PA on May 18,2020.  He has no questions at this time.

## 2018-11-22 ENCOUNTER — Ambulatory Visit (INDEPENDENT_AMBULATORY_CARE_PROVIDER_SITE_OTHER): Payer: Self-pay | Admitting: *Deleted

## 2018-11-22 ENCOUNTER — Telehealth: Payer: Medicare HMO | Admitting: Physician Assistant

## 2018-11-22 VITALS — BP 124/80 | HR 60 | Temp 97.3°F | Resp 16

## 2018-11-22 DIAGNOSIS — Z4802 Encounter for removal of sutures: Secondary | ICD-10-CM

## 2018-11-22 DIAGNOSIS — Z952 Presence of prosthetic heart valve: Secondary | ICD-10-CM

## 2018-11-22 NOTE — Progress Notes (Signed)
Mr. Ollis returns to the office, s/p AVR on 11/10/18 for suture removal of three previous chest tube sites.  These as well as the sternal incision are very well healed and the sutures were easily removed/.  Hee is doing well at [employer], walking.  His only true complaint is nausea.  Tramadol and Metoprolol are the only new meds.  He says the nausea comes and goes.  He relates that he is only doing one walk a day, but that is over one mile and he gets exhausted.  I suggested starting 2-3 walks but shorter distances.  He was "previously a runner for years, is getting very bored and feels like he is wasting time."  I reassured him that he was only 2 weeks post surgery.  V/S stable and lungs CTA all fields. No LE edema. He is eating, but not as before.  Bowels good, only using Tramadol very sparingly. He also complains of being sleepy, hung over feeling.  I suggested only taking half of his Trazodone hs.  He agreed.  He will return as scheduled with a CXR.  I will consult with Dr. Cyndia Bent regarding his nausea and call him.

## 2018-11-24 NOTE — Telephone Encounter (Signed)
Hi Chris, I have not met this patient before but he will be seeing me virtually soon, hence why I got the message. There seem to have been quite a few MyChart messages flying around and he recently also switched from [facility] to you. I do not feel like I can answer this question easily without having met him, so appreciate your help - do you have any input on whether you would change or decrease the beta blocker as below? Thanks, [employer]

## 2018-11-25 ENCOUNTER — Encounter: Payer: Self-pay | Admitting: Physician Assistant

## 2018-11-25 ENCOUNTER — Telehealth: Payer: Self-pay | Admitting: Physician Assistant

## 2018-11-25 NOTE — Telephone Encounter (Signed)
.Patient set up for MyChart? Sent consent to mychart   Is patient using Smartphone/computer/tablet? Yes   Did audio/video work?  Does patient need telephone visit? No   Best phone number to use? [phone]  Special Instructions? Patient is aware to have vitals, states he has already sent them to [facility] via [place] Phone Call  "(Name), I am calling you today to discuss your upcoming appointment. We are currently trying to limit exposure to the virus that causes COVID-19 by seeing patients at home rather than in the office."  1. "What is the BEST phone number to call the day of the visit?" - include this in appointment notes  2. Do you have or have access to (through a family member/friend) a smartphone with video capability that we can use for your visit?" a. If yes - list this number in appt notes as cell (if different from BEST phone #) and list the appointment type as a VIDEO visit in appointment notes b. If no - list the appointment type as a PHONE visit in appointment notes  3. Confirm consent - "In the setting of the current Covid19 crisis, you are scheduled for a (phone or video) visit with your provider on (date) at (time).  Just as we do with many in-office visits, in order for you to participate in this visit, we must obtain consent.  If you'd like, I can send this to your mychart (if signed up) or email for you to review.  Otherwise, I can obtain your verbal consent now.  All virtual visits are billed to your insurance company just like a normal visit would be.  By agreeing to a virtual visit, we'd like you to understand that the technology does not allow for your provider to perform an examination, and thus may limit your provider's ability to fully assess your condition. If your provider identifies any concerns that need to be evaluated in person, we will make arrangements to do so.  Finally, though the technology is pretty good, we cannot  assure that it will always work on either your or our end, and in the setting of a video visit, we may have to convert it to a phone-only visit.  In either situation, we cannot ensure that we have a secure connection.  Are you willing to proceed?" STAFF: Did the patient verbally acknowledge consent to telehealth visit? Document YES/NO here:  yes  4. Advise patient to be prepared - "Two hours prior to your appointment, go ahead and check your blood pressure, pulse, oxygen saturation, and your weight (if you have the equipment to check those) and write them all down. When your visit starts, your provider will ask you for this information. If you have an Apple Watch or Kardia device, please plan to have heart rate information ready on the day of your appointment. Please have a pen and paper handy nearby the day of the visit as well."  5. Give patient instructions for MyChart download to smartphone OR Doximity/Doxy.me as below if video visit (depending on what platform provider is using)  6. Inform patient they will receive a phone call 15 minutes prior to their appointment time (may be from unknown caller ID) so they should be prepared to answer    TELEPHONE CALL NOTE  [facility] has been deemed a candidate for a follow-up tele-health visit to limit community exposure during the Covid-19 pandemic. I spoke with the patient via phone to ensure  availability of phone/video source, confirm preferred email & phone number, and discuss instructions and expectations.  I reminded Joshua Hess to be prepared with any vital sign and/or heart rhythm information that could potentially be obtained via home monitoring, at the time of his visit. I reminded [facility] to expect a phone call prior to his visit.  [name] 11/25/2018 8:55 AM   INSTRUCTIONS FOR DOWNLOADING THE MYCHART APP TO SMARTPHONE  - The patient must first make sure to have activated MyChart and know their login information - If Apple,  go to [employer] and type in MyChart in the search bar and download the app. If Android, ask patient to go to [employer] and type in [place] in the search bar and download the app. The app is free but as with any other app downloads, their phone may require them to verify saved payment information or Apple/Android password.  - The patient will need to then log into the app with their MyChart username and password, and select [place] as their healthcare provider to link the account. When it is time for your visit, go to the MyChart app, find appointments, and click Begin Video Visit. Be sure to Select Allow for your device to access the Microphone and Camera for your visit. You will then be connected, and your provider will be with you shortly.  **If they have any issues connecting, or need assistance please contact MyChart service desk [phone])**  **If using a computer, in order to ensure the best quality for their visit they will need to use either of the following Internet Browsers: [employer], or Google Chrome**  IF USING DOXIMITY or DOXY.ME - The patient will receive a link just prior to their visit by text.     FULL LENGTH CONSENT FOR TELE-HEALTH VISIT   I hereby voluntarily request, consent and authorize [facility] and its employed or contracted physicians, physician assistants, nurse practitioners or other licensed health care professionals (the Practitioner), to provide me with telemedicine health care services (the Services") as deemed necessary by the treating Practitioner. I acknowledge and consent to receive the Services by the Practitioner via telemedicine. I understand that the telemedicine visit will involve communicating with the Practitioner through live audiovisual communication technology and the disclosure of certain medical information by electronic transmission. I acknowledge that I have been given the opportunity to request an in-person  assessment or other available alternative prior to the telemedicine visit and am voluntarily participating in the telemedicine visit.  I understand that I have the right to withhold or withdraw my consent to the use of telemedicine in the course of my care at any time, without affecting my right to future care or treatment, and that the Practitioner or I may terminate the telemedicine visit at any time. I understand that I have the right to inspect all information obtained and/or recorded in the course of the telemedicine visit and may receive copies of available information for a reasonable fee.  I understand that some of the potential risks of receiving the Services via telemedicine include:   Delay or interruption in medical evaluation due to technological equipment failure or disruption;  Information transmitted may not be sufficient (e.g. poor resolution of images) to allow for appropriate medical decision making by the Practitioner; and/or   In rare instances, security protocols could fail, causing a breach of personal health information.  Furthermore, I acknowledge that it is my responsibility to provide information  about my medical history, conditions and care that is complete and accurate to the best of my ability. I acknowledge that Practitioner's advice, recommendations, and/or decision may be based on factors not within their control, such as incomplete or inaccurate data provided by me or distortions of diagnostic images or specimens that may result from electronic transmissions. I understand that the practice of medicine is not an exact science and that Practitioner makes no warranties or guarantees regarding treatment outcomes. I acknowledge that I will receive a copy of this consent concurrently upon execution via email to the email address I last provided but may also request a printed copy by calling the office of [facility].    I understand that my insurance will be billed for this  visit.   I have read or had this consent read to me.  I understand the contents of this consent, which adequately explains the benefits and risks of the Services being provided via telemedicine.   I have been provided ample opportunity to ask questions regarding this consent and the Services and have had my questions answered to my satisfaction.  I give my informed consent for the services to be provided through the use of telemedicine in my medical care  By participating in this telemedicine visit I agree to the above.

## 2018-11-25 NOTE — Telephone Encounter (Signed)
Inadvertantly routed back to me as a duplicate - see message below to Dr. Angelena Form, routed to his basket 5/14.

## 2018-11-25 NOTE — Progress Notes (Signed)
Virtual Visit via Video Note   This visit type was conducted due to national recommendations for restrictions regarding the COVID-19 Pandemic (e.g. social distancing) in an effort to limit this patient's exposure and mitigate transmission in our community.  Due to his co-morbid illnesses, this patient is at least at moderate risk for complications without adequate follow up.  This format is felt to be most appropriate for this patient at this time.  All issues noted in this document were discussed and addressed.  A limited physical exam was performed with this format.  Please refer to the patient's chart for his consent to telehealth for [facility].   Date:  11/28/2018   ID:  Joshua Hess, DOB 12/22/1940, MRN [id number]  Patient Location: Home Provider Location: Home  PCP:  [name], MD  Cardiologist:  Lauree Chandler, MD (switched from Dr. Sallyanne Kuster) Electrophysiologist:  None   Evaluation Performed:  Follow-Up Visit  Chief Complaint:  F/u AVR  History of Present Illness:    Joshua Hess is a 78 y.o. male with severe aortic stenosis s/p recent AVR with post-op atrial fib, baseline sinus bradycardia, nonobstructive CAD by cath 09/2018, carotid artery stenosis (1-39% by duplex 10/2018), ED, elevated PSA, prior skin cancer who presents f/u cardiac surgery. He is a marathon runner. He recently began to notice fatigue with extreme exercise and was found to have severe aortic stenosis. Pre-op cath showed mild nonobstructive CAD. Originally, TAVR was suggested. The day he presened for his surgery, he was found to have a slightly mobile thrombus in the aortic sinus. TAVR was felt contraindicated so he was woken up and had conversation of open tissue AVR instead. This was performed on 11/10/18. Op note states, "There was significant lesion below the right/non-coronary commissure on the ventricular surface of the commissure which was a mass of non-calcified, organized thrombus or  inflammatory debri." He was started on ASA and metoprolol. He was briefly on amiodarone for brief run of atrial fib but this was stopped due to nausea. He was in NSR at discharge per notes and not felt to require anticoagulation. Otherwise his recent labs 11/2018 showed K 3.6, Cr 0.93, Hgb 11.0, plt 134, TSH wnl, 10/2018 LFTS wnl.  He is seen virtually today accompanied by partner [name]. The patient reports persistent nausea which is keeping him from eating well - maybe about 65% of meals and fluid intake. He is taking PRN Tramadol. He denies any CP, SOB, palpitations, edema, orthopnea. It sounds like 2 years of not being able to do as much activity, plus compounded by the recovery of heart surgery, is catching up with him mentally. On Saturday he had a panic attack where his BP spiked so they restarted metoprolol. His partner had to instruct him to stop raking and doing yardwork, but he has returned to walking 1 mile a day and does so without any ill effects. His incision looks good. Mark did a pulse check and HR is regular. The patient does not have symptoms concerning for COVID-19 infection (fever, chills, cough, or new shortness of breath).    Past Medical History:  Diagnosis Date   BPH (benign prostatic hyperplasia)    Cancer (HCC)    behind ear- [place]   Carotid artery stenosis    Chronotropic incompetence    ED (erectile dysfunction)    Elevated PSA    HOH (hard of hearing)    Mild CAD    Postoperative atrial fibrillation (HCC)    Severe aortic stenosis  a. s/p tissue AVR 10/2018.   Sinus bradycardia    baseline   Wears glasses    Past Surgical History:  Procedure Laterality Date   AORTIC VALVE REPLACEMENT N/A 11/10/2018   Procedure: AORTIC VALVE REPLACEMENT (AVR), USING 23MM INSPIRIS;  Surgeon: Gaye Pollack, MD;  Location: [place];  Service: Open Heart Surgery;  Laterality: N/A;   EXTERNAL EAR SURGERY     melanoma removed   HERNIA REPAIR     RIGHT/LEFT HEART CATH  AND CORONARY ANGIOGRAPHY N/A 09/29/2018   Procedure: RIGHT/LEFT HEART CATH AND CORONARY ANGIOGRAPHY;  Surgeon: Burnell Blanks, MD;  Location: [place] [facility];  Service: Cardiovascular;  Laterality: N/A;   TEE WITHOUT CARDIOVERSION N/A 11/10/2018   Procedure: TRANSESOPHAGEAL ECHOCARDIOGRAM (TEE);  Surgeon: Gaye Pollack, MD;  Location: [place];  Service: Open Heart Surgery;  Laterality: N/A;   TRANSCATHETER AORTIC VALVE REPLACEMENT, TRANSFEMORAL     PROCEDURE ATTEMPTED & ABORTED      Current Meds  Medication Sig   aspirin EC 81 MG EC tablet Take 1 tablet (81 mg total) by mouth daily.   metoprolol tartrate (LOPRESSOR) 25 MG tablet Take 1 tablet (25 mg total) by mouth 2 (two) times daily.   traMADol (ULTRAM) 50 MG tablet Take 50 mg by mouth every 6 (six) hours as needed.   traZODone (DESYREL) 50 MG tablet Take 50 mg by mouth at bedtime as needed (sleep).      Allergies:   Patient has no known allergies.   Social History   Tobacco Use   Smoking status: Former Smoker    Years: 1.00    Last attempt to quit: 1970    Years since quitting: 50.4   Smokeless tobacco: Never Used  Substance Use Topics   Alcohol use: No   Drug use: No     Family Hx: The patient's family history includes Heart disease in his brother.  ROS:   Please see the history of present illness.    All other systems reviewed and are negative.   Prior CV studies:   The following studies were reviewed today:  Cardiac Cath 09/2018 Conclusion    Prox RCA to Mid RCA lesion is 20% stenosed.  Mid RCA lesion is 30% stenosed.  Ost Cx to Prox Cx lesion is 30% stenosed.  Prox LAD lesion is 10% stenosed.  Mid LAD lesion is 20% stenosed.  Mid LAD to Dist LAD lesion is 20% stenosed.   1. Mild non-obstructive CAD 2. Severe aortic stenosis by echo (by cath mean gradient 17. 7mmHg and peak to peak gradient 24 mmHg)    Labs/Other Tests and Data Reviewed:    EKG:  An ECG dated 11/12/18 was  personally reviewed today and demonstrated:  atrial fib with RVR 108bpm, nonspecific TW changes  Recent Labs: 11/04/2018: ALT 14; B Natriuretic Peptide 395.7 11/13/2018: Magnesium 1.9; TSH 1.132 11/14/2018: BUN 26; Creatinine, Ser 0.93; Hemoglobin 11.0; Platelets 134; Potassium 3.6; Sodium 141   Recent Lipid Panel No results found for: CHOL, TRIG, HDL, CHOLHDL, LDLCALC, LDLDIRECT  Wt Readings from Last 3 Encounters:  11/28/18 161 lb (73 kg)  11/15/18 165 lb 14.4 oz (75.3 kg)  11/04/18 171 lb 1.6 oz (77.6 kg)     Objective:    Vital Signs:  BP 112/71    Pulse 61    Ht 5\' 9"  (1.753 m)    Wt 161 lb (73 kg)    BMI 23.78 kg/m    VITAL SIGNS:  reviewed  General -  thin WM in no acute distress HEENT - NCAT, EOM intact Pulm - No labored breathing, no coughing during visit, no audible wheezing, speaking in full sentences Neuro - A+Ox3, no slurred speech, answers questions appropriately Skin - sternal incision and endoscopic sites appear to be healing well without dehiscence/suppuration or erythema Psych - Pleasant affect     ASSESSMENT & PLAN:    1. AS s/p AVR - aside from nausea appears to be doing clinically well. I do see he is struggling with the concept of not being able to do as much the last 2 years after previously running marathons regularly. I have encouraged him to make sure he is in contact with his primary care to address potential anxiety/depression. Regarding surgical follow-up, he has f/u with CVTS in person 5/26 per his report. SBE ppx reviewed. 2. Mild CAD - no angina to report. Would hold off on any statin initiation at this time given his nausea so as not to compound the question. 3. Post-op atrial fib - this was noted briefly in the hospital. TCTS did not feel anticoag was necessary. HR is regular today per partner report. Continue to monitor. They follow BP and HR diligently. If he has recurrent elevated HR, consider placement of event monitor. Also suggested Jodelle Red as an  option to follow. 4. Nausea - I suspect this is compounded by hemodynamic changes from corrected AS, compounded by poor oral intake and PRN pain medication use. I have advised he make sure to eat before taking Tramadol, and have prescribed Zofran 4mg  every 6 hours as needed for nausea. I advised he take this before taking pain medication if possible to help prevent the vicious cycle of nausea, then poor oral intake, then persistent nausea. If this persists may need to think about other causes. He will keep [place] updated.   COVID-19 Education: The signs and symptoms of COVID-19 were discussed with the patient and how to seek care for testing (follow up with PCP or arrange E-visit).  The importance of social distancing was discussed today.  Time:   Today, I have spent 21 minutes with the patient with telehealth technology discussing the above problems.     Medication Adjustments/Labs and Tests Ordered: Current medicines are reviewed at length with the patient today.  Concerns regarding medicines are outlined above.   Disposition:  Follow up in 2 weeks for VOV with Dr. McAlhany/care team  Signed, Charlie Pitter, PA-C  11/28/2018 10:27 AM    [place]

## 2018-11-28 ENCOUNTER — Telehealth (INDEPENDENT_AMBULATORY_CARE_PROVIDER_SITE_OTHER): Payer: Medicare HMO | Admitting: Physician Assistant

## 2018-11-28 ENCOUNTER — Other Ambulatory Visit: Payer: Self-pay

## 2018-11-28 ENCOUNTER — Telehealth (HOSPITAL_COMMUNITY): Payer: Self-pay | Admitting: *Deleted

## 2018-11-28 ENCOUNTER — Encounter: Payer: Self-pay | Admitting: Physician Assistant

## 2018-11-28 VITALS — BP 112/71 | HR 61 | Ht 69.0 in | Wt 161.0 lb

## 2018-11-28 DIAGNOSIS — I9789 Other postprocedural complications and disorders of the circulatory system, not elsewhere classified: Secondary | ICD-10-CM

## 2018-11-28 DIAGNOSIS — I2510 Atherosclerotic heart disease of native coronary artery without angina pectoris: Secondary | ICD-10-CM

## 2018-11-28 DIAGNOSIS — I350 Nonrheumatic aortic (valve) stenosis: Secondary | ICD-10-CM | POA: Diagnosis not present

## 2018-11-28 DIAGNOSIS — R110 Nausea: Secondary | ICD-10-CM

## 2018-11-28 DIAGNOSIS — Z952 Presence of prosthetic heart valve: Secondary | ICD-10-CM

## 2018-11-28 DIAGNOSIS — I4891 Unspecified atrial fibrillation: Secondary | ICD-10-CM

## 2018-11-28 MED ORDER — ONDANSETRON HCL 4 MG PO TABS: 4.0000 mg | ORAL_TABLET | Freq: Three times a day (TID) | ORAL | 0 refills | Status: DC | PRN

## 2018-11-28 NOTE — Telephone Encounter (Signed)
Called pt who was in [place] up prescriptions.  Pt will call back when he is back at home. Cherre Huger, BSN Cardiac and Training and development officer

## 2018-11-28 NOTE — Telephone Encounter (Signed)
Addressed concerns in today's OV.

## 2018-11-28 NOTE — Patient Instructions (Addendum)
Mdication Instructions:  Your physician has recommended you make the following change in your medication:  1.  STOP Metoprolol 2.  START Zofran 4 mg tablets use as directed on the bottle   If you need a refill on your cardiac medications before your next appointment, please call your pharmacy.   Lab work: None ordered  If you have labs (blood work) drawn today and your tests are completely normal, you will receive your results only by: Marland Kitchen MyChart Message (if you have MyChart) OR . A paper copy in the mail If you have any lab test that is abnormal or we need to change your treatment, we will call you to review the results.  Testing/Procedures: None ordered   Follow-Up: Your physician recommends that you schedule a follow-up appointment in: [place] VISIT, 12/09/2018 @ 10:30 WITH DAYNA DUNN.  THIS WORKS THE SAME AS TODAY'S VISIT.  Any Other Special Instructions Will Be Listed Below (If Applicable)  ENDOCARDITIS PREVENTION   You may be at risk for developing endocarditis since you have an artificial heart valve or a repaired heart valve. Endocarditis is an infection of the lining of the heart or heart valves. Certain surgical and dental procedures may put you at risk, such as teeth cleaning or other dental procedures or any surgery involving the respiratory, urinary, gastrointestinal tract, gallbladder or prostate.   To reduce this risk, you will need to take antibiotics before certain procedures. Notify your doctor or dentist before having any procedures.    To prevent endocarditis, maintain good oral health. Seek prompt medical attention for any mouth/gum, skin or urinary tract infections.

## 2018-11-28 NOTE — Telephone Encounter (Signed)
Addressed concerns in today's visit.

## 2018-12-02 ENCOUNTER — Other Ambulatory Visit: Payer: Self-pay | Admitting: Surgery

## 2018-12-02 DIAGNOSIS — Z952 Presence of prosthetic heart valve: Secondary | ICD-10-CM

## 2018-12-06 ENCOUNTER — Ambulatory Visit (INDEPENDENT_AMBULATORY_CARE_PROVIDER_SITE_OTHER): Payer: Self-pay | Admitting: Physician Assistant

## 2018-12-06 ENCOUNTER — Ambulatory Visit
Admission: RE | Admit: 2018-12-06 | Discharge: 2018-12-06 | Disposition: A | Discharge status: Home / Self Care | Payer: Medicare HMO | Source: Ambulatory Visit | Attending: Surgery | Admitting: Surgery

## 2018-12-06 ENCOUNTER — Other Ambulatory Visit: Payer: Self-pay

## 2018-12-06 VITALS — BP 153/94 | HR 68 | Temp 97.7°F | Resp 16 | Ht 69.0 in | Wt 166.0 lb

## 2018-12-06 DIAGNOSIS — Z953 Presence of xenogenic heart valve: Secondary | ICD-10-CM | POA: Diagnosis not present

## 2018-12-06 DIAGNOSIS — Z952 Presence of prosthetic heart valve: Secondary | ICD-10-CM

## 2018-12-06 DIAGNOSIS — I350 Nonrheumatic aortic (valve) stenosis: Secondary | ICD-10-CM

## 2018-12-06 MED ORDER — ALPRAZOLAM 0.5 MG PO TABS: 0.5000 mg | ORAL_TABLET | Freq: Three times a day (TID) | ORAL | 0 refills | Status: DC | PRN

## 2018-12-06 NOTE — Progress Notes (Signed)
HPI: Patient returns for routine postoperative follow-up having undergone AVR on 11/10/2018. The patient's early postoperative recovery while in the hospital was notable for Atrial Fibrillation which responded to Amiodarone. Since hospital discharge the patient reports he is not doing well.  He states that he used to run 26 miles at a time without it being any big deal.  He states that he is having horrible nausea which gets relieved with walking, cool compresses.  He states that zofran made it worse.  He states that he is having difficulty sleeping, stating he worries, and feels like he cant breath, and is fearful he may not wake up.  He is walking without any difficulty.  His incisions are healing without evidence of infection.  He states his chest is sensitive to touch.   Current Outpatient Medications  Medication Sig Dispense Refill  . aspirin EC 81 MG EC tablet Take 1 tablet (81 mg total) by mouth daily.    . traZODone (DESYREL) 50 MG tablet Take 50 mg by mouth at bedtime as needed (sleep).      No current facility-administered medications for this visit.     Physical Exam:  BP (!) 153/94 (BP Location: Right Arm, Patient Position: Sitting, Cuff Size: Normal)   Pulse 68   Temp 97.7 F (36.5 C) (Skin)   Resp 16   Ht 5\' 9"  (1.753 m)   Wt 166 lb (75.3 kg)   SpO2 96% Comment: RA  BMI 24.51 kg/m   Gen: visibly anxious, pacing room, ringing hands Heart: RRR Lungs; CTA bilaterally Ext: no edema Incision: well healed  Diagnostic Tests:  CXR: no pneumothorax, no pleural effusion  A/P;  1. S/P AVR 2. Post op Atrial Fibrillation- has been maintaining NSR since prior to hospital discharge- not on Amiodarone or Lopressor due to Bradycardia 3. Anxiety/Depression- patient is having a hard time not being as active as is usually is.  He is also suffering from what I suspect are panic attacks with episodes of nausea and fear... will give a prescription for xanax prn, but stressed the  importance of being evaluated by PCP for consistent treatment and management 4. Activity- increase as tolerated, instructed patient to increase his walking distance as long as he can tolerate, okay to start riding a bike on level ground, instructed on importance of maintaining lifting restrictions for the next several weeks.  He will participate in cardiac rehab 5. Dispo- RTC in 1 year, if he doesn't feel he is improving will return to office sooner  Ellwood Handler, PA-C [facility] [phone]

## 2018-12-06 NOTE — Patient Instructions (Signed)
You may return to driving an automobile as long as you are no longer requiring oral narcotic pain relievers during the daytime.  It would be wise to start driving only short distances during the daylight and gradually increase from there as you feel comfortable.  Endocarditis is a potentially serious infection of heart valves or inside lining of the heart.  It occurs more commonly in patients with diseased heart valves (such as patient's with aortic or mitral valve disease) and in patients who have undergone heart valve repair or replacement.  Certain surgical and dental procedures may put you at risk, such as dental cleaning, other dental procedures, or any surgery involving the respiratory, urinary, gastrointestinal tract, gallbladder or prostate gland.   To minimize your chances for develooping endocarditis, maintain good oral health and seek prompt medical attention for any infections involving the mouth, teeth, gums, skin or urinary tract.    Always notify your doctor or dentist about your underlying heart valve condition before having any invasive procedures. You will need to take antibiotics before certain procedures, including all routine dental cleanings or other dental procedures.  Your cardiologist or dentist should prescribe these antibiotics for you to be taken ahead of time.  Make every effort to maintain a "heart-healthy" lifestyle with regular physical exercise and adherence to a low-fat, low-carbohydrate diet.  Continue to seek regular follow-up appointments with your primary care physician and/or cardiologist.   You may continue to gradually increase your physical activity as tolerated.  Refrain from any heavy lifting or strenuous use of your arms and shoulders until at least 8 weeks from the time of your surgery, and avoid activities that cause increased pain in your chest on the side of your surgical incision.  Otherwise you may continue to increase activities without any particular  limitations.  Increase the intensity and duration of physical activity gradually.        

## 2018-12-08 DIAGNOSIS — Z952 Presence of prosthetic heart valve: Secondary | ICD-10-CM | POA: Diagnosis not present

## 2018-12-08 DIAGNOSIS — I6523 Occlusion and stenosis of bilateral carotid arteries: Secondary | ICD-10-CM | POA: Diagnosis not present

## 2018-12-08 DIAGNOSIS — N3943 Post-void dribbling: Secondary | ICD-10-CM | POA: Diagnosis not present

## 2018-12-08 DIAGNOSIS — R5383 Other fatigue: Secondary | ICD-10-CM | POA: Diagnosis not present

## 2018-12-08 DIAGNOSIS — F419 Anxiety disorder, unspecified: Secondary | ICD-10-CM | POA: Diagnosis not present

## 2018-12-08 NOTE — Progress Notes (Signed)
Virtual Visit via Video Note   This visit type was conducted due to national recommendations for restrictions regarding the COVID-19 Pandemic (e.g. social distancing) in an effort to limit this patient's exposure and mitigate transmission in our community.  Due to his co-morbid illnesses, this patient is at least at moderate risk for complications without adequate follow up.  This format is felt to be most appropriate for this patient at this time.  All issues noted in this document were discussed and addressed.  A limited physical exam was performed with this format.  Please refer to the patient's chart for his consent to telehealth for [facility].   Date:  12/09/2018   ID:  Joshua CHAPPLE, DOB Nov 03, 1940, MRN [id number]  Patient Location: Home Provider Location: Home  PCP:  [name], MD  Cardiologist:  Lauree Chandler, MD  Electrophysiologist:  None   Evaluation Performed:  Follow-Up Visit  Chief Complaint:  F/u AVR  History of Present Illness:    Joshua Hess is a 78 y.o. male with severe aortic stenosis s/p recent AVR with post-op atrial fib, baseline sinus bradycardia, nonobstructive CAD by cath 09/2018, carotid artery stenosis (1-39% by duplex 10/2018), ED, elevated PSA, prior skin cancer who presents f/u cardiac surgery. He is a marathon runner. He recently began to notice fatigue with extreme exercise and was found to have severe aortic stenosis. Pre-op cath showed mild nonobstructive CAD. Originally, TAVR was suggested. The day he presened for his surgery, he was found to have a slightly mobile thrombus in the aortic sinus. TAVR was felt contraindicated so he was woken up and had conversation of open tissue AVR instead. This was performed on 11/10/18. Op note states, "There was significant lesion below the right/non-coronary commissure on the ventricular surface of the commissure which was a mass of non-calcified, organized thrombus or inflammatory debri." He was started on  ASA and metoprolol. He was briefly on amiodarone for brief run of atrial fib but this was stopped due to nausea. He was in NSR at discharge per notes and not felt to require anticoagulation. He has struggled with some post-op nausea, anxiety and insomnia. Metoprolol was stopped after discharge due to this. Otherwise his recent labs 11/2018 showed K 3.6, Cr 0.93, Hgb 11.0, plt 134, TSH wnl, 10/2018 LFTS wnl. At [place] 5/26 with TCTS he was still in NSR.   He is seen today virtually again with partner at his side. Nausea is finally slowly improving. He began the visit shortly after an anxiety attack and had just taken an alprazolam. This was prescribed by TCTS. He saw Dr. Shelia Media yesterday to work through a plan for his persistent anxiety post-surgery. By the end of the visit he reported feeling much calmer. He typically tries to walk off the panic attacks in the yard. He denies any anginal pain, dyspnea, edema, syncope. He has been increasing walking and even did 5 miles the other day. He does have occasional waves of dizziness throughout the week. When they check vitals his BP and HR have become elevated and they wonder if it is also related to anxiety. He has not felt any acute palpitations. He notes sensitivity of his incision site but no specific acute complications otherwise.  The patient does not have symptoms concerning for COVID-19 infection (fever, chills, cough, or new shortness of breath).    Past Medical History:  Diagnosis Date   BPH (benign prostatic hyperplasia)    Cancer ([place])    behind ear- [place]  Carotid artery stenosis    Chronotropic incompetence    ED (erectile dysfunction)    Elevated PSA    HOH (hard of hearing)    Mild CAD    Postoperative atrial fibrillation (HCC)    Severe aortic stenosis    a. s/p tissue AVR 10/2018.   Sinus bradycardia    baseline   Wears glasses    Past Surgical History:  Procedure Laterality Date   AORTIC VALVE REPLACEMENT N/A 11/10/2018    Procedure: AORTIC VALVE REPLACEMENT (AVR), USING 23MM INSPIRIS;  Surgeon: Gaye Pollack, MD;  Location: [facility];  Service: Open Heart Surgery;  Laterality: N/A;   EXTERNAL EAR SURGERY     melanoma removed   HERNIA REPAIR     RIGHT/LEFT HEART CATH AND CORONARY ANGIOGRAPHY N/A 09/29/2018   Procedure: RIGHT/LEFT HEART CATH AND CORONARY ANGIOGRAPHY;  Surgeon: Burnell Blanks, MD;  Location: [facility];  Service: Cardiovascular;  Laterality: N/A;   TEE WITHOUT CARDIOVERSION N/A 11/10/2018   Procedure: TRANSESOPHAGEAL ECHOCARDIOGRAM (TEE);  Surgeon: Gaye Pollack, MD;  Location: [facility];  Service: Open Heart Surgery;  Laterality: N/A;   TRANSCATHETER AORTIC VALVE REPLACEMENT, TRANSFEMORAL     PROCEDURE ATTEMPTED & ABORTED      Current Meds  Medication Sig   ALPRAZolam (XANAX) 0.5 MG tablet Take 1 tablet (0.5 mg total) by mouth 3 (three) times daily as needed for anxiety.   aspirin EC 81 MG EC tablet Take 1 tablet (81 mg total) by mouth daily.   LIDOCAINE EX Apply 1 application topically as needed.   [employer] [employer] [facility]) GEL Apply 1 application topically daily.   tamsulosin (FLOMAX) 0.4 MG CAPS capsule Take 0.4 mg by mouth daily. For 30 days   traZODone (DESYREL) 50 MG tablet Take 50 mg by mouth at bedtime as needed (sleep).      Allergies:   Patient has no known allergies.   Social History   Tobacco Use   Smoking status: Former Smoker    Years: 1.00    Last attempt to quit: 1970    Years since quitting: 50.4   Smokeless tobacco: Never Used  Substance Use Topics   Alcohol use: No   Drug use: No     Family Hx: The patient's family history includes Heart disease in his brother.  ROS:   Please see the history of present illness.    All other systems reviewed and are negative.   Prior CV studies:   Most recent pertinent cardiac studies are outlined above, plus:  Cardiac Cath 09/2018 Conclusion              Prox RCA to Mid RCA lesion  is 20% stenosed.            Mid RCA lesion is 30% stenosed.            Ost Cx to Prox Cx lesion is 30% stenosed.            Prox LAD lesion is 10% stenosed.            Mid LAD lesion is 20% stenosed.            Mid LAD to Dist LAD lesion is 20% stenosed.   1. Mild non-obstructive CAD 2. Severe aortic stenosis by echo (by cath mean gradient 17. 44mmHg and peak to peak gradient 24 mmHg)  Labs/Other Tests and Data Reviewed:    EKG:  EKG:  An ECG dated 11/12/18 was personally reviewed  today and demonstrated:  atrial fib with RVR 108bpm, nonspecific TW changes  Recent Labs: 11/04/2018: ALT 14; B Natriuretic Peptide 395.7 11/13/2018: Magnesium 1.9; TSH 1.132 11/14/2018: BUN 26; Creatinine, Ser 0.93; Hemoglobin 11.0; Platelets 134; Potassium 3.6; Sodium 141   Recent Lipid Panel No results found for: CHOL, TRIG, HDL, CHOLHDL, LDLCALC, LDLDIRECT  Wt Readings from Last 3 Encounters:  12/09/18 170 lb (77.1 kg)  12/06/18 166 lb (75.3 kg)  11/28/18 161 lb (73 kg)     Objective:    Vital Signs:  BP 134/84    Pulse 68    Ht 5\' 9"  (1.753 m)    Wt 170 lb (77.1 kg)    BMI 25.10 kg/m    VITAL SIGNS:  reviewed  General - elderly white male in no acute distress HEENT - NCAT, EOM intact Pulm - No labored breathing, no coughing during visit, no audible wheezing, speaking in full sentences Neuro - A+Ox3, no slurred speech, answers questions appropriately Skin - sternal incision appears to be healing well without acute complication Psych - Stable affect, calm     ASSESSMENT & PLAN:    1. AS s/p AVR - aside from anxiety I think he is recovering well. Continue ASA. He had some questions about lifting with yardwork and applying lidocaine cream to his incision. These are typically questions we defer to surgical team so they will call Dr. Vivi Martens office to clarify. Also reviewed SBE ppx. Send in standing rx for amoxicillin 500mg  4 tabs 30-60 minutes prior to dental work.  2. Mild CAD - continue ASA. No  BB due to baseline bradycardia. He is generally averse to taking medicines and continues to have some dizziness, depression, anxiety and nausea. I do not think now is the right to time to broach the issue of introducing an additional med such as  statin but this can be considered in follow-up. 3. Post-op atrial fibrillation - he was in NSR by TCTS visit notes. Also saw PCP yesterday as well. See below regarding dizziness.  4. Anxiety - remains an active issue, and I am glad to hear he's working through a plan with Dr. Shelia Media. Given his desire to generally avoid medications I gave him Dr. Orion Crook number (psychologist). Dr. Dorris Carnes has referred several patients there suffering from trauma of health problems and reports success. 5. Dizziness - unclear what this represents. BP is not high during episodes, but HR can be. May be related to anxiety and hemodynamic shifts from surgery, but will arrange 7 day Zio to exclude bradycardia or breakthrough PAF.  COVID-19 Education: The signs and symptoms of COVID-19 were discussed with the patient and how to seek care for testing (follow up with PCP or arrange E-visit).  The importance of social distancing was discussed today.  Time:   Today, I have spent 17 minutes with the patient with telehealth technology discussing the above problems.     Medication Adjustments/Labs and Tests Ordered: Current medicines are reviewed at length with the patient today.  Concerns regarding medicines are outlined above.   Disposition:  Follow up in 3 months with Dr. Angelena Form  Signed, Charlie Pitter, PA-C  12/09/2018 10:36 AM    [place]

## 2018-12-09 ENCOUNTER — Telehealth (HOSPITAL_COMMUNITY): Payer: Self-pay | Admitting: *Deleted

## 2018-12-09 ENCOUNTER — Telehealth: Payer: Self-pay | Admitting: Radiology

## 2018-12-09 ENCOUNTER — Encounter: Payer: Self-pay | Admitting: Physician Assistant

## 2018-12-09 ENCOUNTER — Other Ambulatory Visit: Payer: Self-pay

## 2018-12-09 ENCOUNTER — Telehealth (INDEPENDENT_AMBULATORY_CARE_PROVIDER_SITE_OTHER): Payer: Medicare HMO | Admitting: Physician Assistant

## 2018-12-09 VITALS — BP 134/84 | HR 68 | Ht 69.0 in | Wt 170.0 lb

## 2018-12-09 DIAGNOSIS — Z952 Presence of prosthetic heart valve: Secondary | ICD-10-CM

## 2018-12-09 DIAGNOSIS — I9789 Other postprocedural complications and disorders of the circulatory system, not elsewhere classified: Secondary | ICD-10-CM

## 2018-12-09 DIAGNOSIS — R42 Dizziness and giddiness: Secondary | ICD-10-CM

## 2018-12-09 DIAGNOSIS — F419 Anxiety disorder, unspecified: Secondary | ICD-10-CM

## 2018-12-09 DIAGNOSIS — I4891 Unspecified atrial fibrillation: Secondary | ICD-10-CM

## 2018-12-09 DIAGNOSIS — I2510 Atherosclerotic heart disease of native coronary artery without angina pectoris: Secondary | ICD-10-CM

## 2018-12-09 MED ORDER — AMOXICILLIN 500 MG PO CAPS: ORAL_CAPSULE | ORAL | 1 refills | Status: DC

## 2018-12-09 NOTE — Telephone Encounter (Signed)
Enrolled patient for a 7 day Zio monitor to be mailed. Brief instructions were gone over with the patient and he knows to expect the monitor to arrive in 3-4 days

## 2018-12-09 NOTE — Telephone Encounter (Signed)
Received notification that pt would like to know his options for cardiac rehab.  Called and pt answered but was out in [place] and could not speak with me.  Pt acknowledge that I was with Cardiac Rehab.  Pt stated he will call me when he gets home. Advised pt that we as staff are not in the department everyday. I would be available until 4:00pm.  Pt verbalized understanding Jadea Shiffer Armed forces operational officer, BSN Cardiac and Pulmonary Rehab Nurse Navigator

## 2018-12-09 NOTE — Patient Instructions (Addendum)
Medication Instructions:  Your physician recommends that you continue on your current medications as directed. Please refer to the Current Medication list given to you today.   We have sent in Amoxicillin 500 mg.  You take 4 tablets 30-60 minutes before dental procedure.  If you need a refill on your cardiac medications before your next appointment, please call your pharmacy.   Lab work: None ordered  If you have labs (blood work) drawn today and your tests are completely normal, you will receive your results only by: Marland Kitchen MyChart Message (if you have MyChart) OR . A paper copy in the mail If you have any lab test that is abnormal or we need to change your treatment, we will call you to review the results.  Testing/Procedures: Your physician has recommended that you wear a heart monitor.  Someone will call you to arrange this.  Monitors are medical devices that record the heart's electrical activity. Doctors most often use these monitors to diagnose arrhythmias. Arrhythmias are problems with the speed or rhythm of the heartbeat. The monitor is a small, portable device. You can wear one while you do your normal daily activities. This is usually used to diagnose what is causing palpitations/syncope (passing out).    Follow-Up: At [facility], you and your health needs are our priority.  As part of our continuing mission to provide you with exceptional heart care, we have created designated Provider Care Teams.  These Care Teams include your primary Cardiologist (physician) and Advanced Practice Providers (APPs -  Physician Assistants and Nurse Practitioners) who all work together to provide you with the care you need, when you need it. You will need a follow up appointment in 3 months IN OFFICE.   I will call you with this appointment.  You may see Lauree Chandler, MD or one of the following Advanced Practice Providers on your designated Care Team:   [place], PA-C Melina Copa,  PA-C . Ermalinda Barrios, PA-C  Any Other Special Instructions Will Be Listed Below (If Applicable).  Please talk to Dr. Vivi Martens office about their approval of ointments and what lifting/yardwork restrictions they suggest.   Hang in there. You were strong before this and are showing you are strong through this!

## 2018-12-09 NOTE — Telephone Encounter (Signed)
Received call back from both pt and significant other on speaker phone.  Advised them of our continued closure for group exercise due to Covid-19. However we are able to offer virtual cardiac rehab.  Reviewed general practice of pt downloading better hearts app to his phone.  He would use this to log information such as exercise and vital signs.  Opportunities to [employer] as well.  Pt luke warm to the idea since he was such an avid exerciser prior to his heart surgery. Explained the benefits and the periodic monitoring and feedback provided by rehab staff.  Pt significant other thought this would be a benefit.  Pt to anticipate a call from our staff to schedule a intake appointment.  Will send pt information on warm up, cool down stretches and safe movement post heart surgery. Pt and significant other appreciative of the information and would like to be contacted as soon as possible. Cherre Huger, BSN Cardiac and Training and development officer

## 2018-12-12 DIAGNOSIS — D692 Other nonthrombocytopenic purpura: Secondary | ICD-10-CM | POA: Diagnosis not present

## 2018-12-12 DIAGNOSIS — Z85828 Personal history of other malignant neoplasm of skin: Secondary | ICD-10-CM | POA: Diagnosis not present

## 2018-12-12 DIAGNOSIS — D225 Melanocytic nevi of trunk: Secondary | ICD-10-CM | POA: Diagnosis not present

## 2018-12-12 DIAGNOSIS — L821 Other seborrheic keratosis: Secondary | ICD-10-CM | POA: Diagnosis not present

## 2018-12-12 DIAGNOSIS — L570 Actinic keratosis: Secondary | ICD-10-CM | POA: Diagnosis not present

## 2018-12-12 DIAGNOSIS — C44722 Squamous cell carcinoma of skin of right lower limb, including hip: Secondary | ICD-10-CM | POA: Diagnosis not present

## 2018-12-12 DIAGNOSIS — L905 Scar conditions and fibrosis of skin: Secondary | ICD-10-CM | POA: Diagnosis not present

## 2018-12-12 DIAGNOSIS — L814 Other melanin hyperpigmentation: Secondary | ICD-10-CM | POA: Diagnosis not present

## 2018-12-12 DIAGNOSIS — D1801 Hemangioma of skin and subcutaneous tissue: Secondary | ICD-10-CM | POA: Diagnosis not present

## 2018-12-12 DIAGNOSIS — Z85820 Personal history of malignant melanoma of skin: Secondary | ICD-10-CM | POA: Diagnosis not present

## 2018-12-12 DIAGNOSIS — D0472 Carcinoma in situ of skin of left lower limb, including hip: Secondary | ICD-10-CM | POA: Diagnosis not present

## 2018-12-14 ENCOUNTER — Ambulatory Visit (INDEPENDENT_AMBULATORY_CARE_PROVIDER_SITE_OTHER): Payer: Medicare HMO | Admitting: Psychology

## 2018-12-14 DIAGNOSIS — F411 Generalized anxiety disorder: Secondary | ICD-10-CM

## 2018-12-15 ENCOUNTER — Ambulatory Visit (INDEPENDENT_AMBULATORY_CARE_PROVIDER_SITE_OTHER): Payer: Medicare HMO

## 2018-12-15 DIAGNOSIS — R42 Dizziness and giddiness: Secondary | ICD-10-CM

## 2018-12-19 NOTE — Telephone Encounter (Signed)
Joshua Hess, this patient underwent conventional AVR on 4/30 after his TAVR was aborted 4/28 due to "sizable slightly mobile thrombus present in the sinus. Therefore TAVR is contraindicated." Thoughts on exercise that he is requesting? Appreciate your help. Do not get many post surgical patients requesting to start running again. Dayna

## 2018-12-22 ENCOUNTER — Telehealth (HOSPITAL_COMMUNITY): Payer: Self-pay | Admitting: *Deleted

## 2018-12-22 NOTE — Telephone Encounter (Signed)
Pt is interested in participating in Virtual Cardiac Rehab. Pt advised that Virtual Cardiac Rehab is provided at no cost to the patient.  Checklist:  1. Pt has smart device  ie smartphone and/or ipad for downloading an app  Yes 2. Reliable internet/wifi service    Yes 3. Understands how to use their smartphone and navigate within an app.  Yes 4.  Reviewed with pt the scheduling process for virtual cardiac rehab.  Pt verbalized understanding.           Confirm Consent - In the setting of the current Covid19 crisis, you are scheduled for a phone visit with your Cardiac or Pulmonary team member.  Just as we do with many in-gym visits, in order for you to participate in this visit, we must obtain consent.  If you'd like, I can send this to your mychart (if signed up) or email for you to review.  Otherwise, I can obtain your verbal consent now.  By agreeing to a telephone visit, we'd like you to understand that the technology does not allow for your Cardiac or Pulmonary Rehab team member to perform a physical assessment, and thus may limit their ability to fully assess your ability to perform exercise programs. If your provider identifies any concerns that need to be evaluated in person, we will make arrangements to do so.  Finally, though the technology is pretty good, we cannot assure that it will always work on either your or our end and we cannot ensure that we have a secure connection.  Cardiac and Pulmonary Rehab Telehealth visits and "At Home" cardiac and pulmonary rehab are provided at no cost to you.        Are you willing to proceed?"        STAFF: Did the patient verbally acknowledge consent to telehealth visit? Document YES/NO here: Yes   Virtual appointment made for tomorrow for the patient to set up the APP. Patient asked about running. I asked to the patient to check with Dr Cyndia Bent.  Barnet Pall RN  Cardiac and Pulmonary Rehab Staff        June 11  2:56 pm

## 2018-12-23 ENCOUNTER — Encounter (HOSPITAL_COMMUNITY)
Admission: RE | Admit: 2018-12-23 | Discharge: 2018-12-23 | Disposition: A | Discharge status: Home / Self Care | Payer: Self-pay | Source: Ambulatory Visit | Attending: Cardiovascular Disease | Admitting: Cardiovascular Disease

## 2018-12-23 ENCOUNTER — Other Ambulatory Visit: Payer: Self-pay

## 2018-12-23 ENCOUNTER — Telehealth (HOSPITAL_COMMUNITY): Payer: Self-pay | Admitting: *Deleted

## 2018-12-23 NOTE — Telephone Encounter (Signed)
Called and spoke to pt regarding Virtual Cardiac Rehab.  Pt  was able to download the Better Hearts app on their smart device with no issues. Pt set up their account and received the following welcome message -"Welcome to the [facility] and Pulmonary Rehabilitation program. We hope that you will find the exercise program beneficial in your recovery process. Our staff is available to assist with any questions/concerns about your exercise routine. Best wishes". Brief orientation provided to with the advisement to watch the "Intro to Rehab" series located under the Resource tab. Pt verbalized understanding. Will continue to follow and monitor pt progress with feedback as needed. Patient reminded not to jog until he gets clearance from the surgeon.Barnet Pall, RN,BSN 12/23/2018 10:17 AM

## 2018-12-26 ENCOUNTER — Ambulatory Visit (INDEPENDENT_AMBULATORY_CARE_PROVIDER_SITE_OTHER): Payer: Medicare HMO | Admitting: Psychology

## 2018-12-26 DIAGNOSIS — F411 Generalized anxiety disorder: Secondary | ICD-10-CM

## 2018-12-27 ENCOUNTER — Encounter: Payer: Self-pay | Admitting: *Deleted

## 2019-01-03 DIAGNOSIS — R42 Dizziness and giddiness: Secondary | ICD-10-CM | POA: Diagnosis not present

## 2019-01-04 ENCOUNTER — Telehealth (HOSPITAL_COMMUNITY): Payer: Self-pay | Admitting: *Deleted

## 2019-01-04 NOTE — Telephone Encounter (Signed)
Received message from inpatient rehabilitation that Mr. Joshua Hess had called and left message for cardiac rehab.  Contacted pt and pt is very anxious about what he can and can not do for exercise.  Pt is on our virtual cardiac rehab app and has logged a couple of exercise sessions.  Based upon his discussion, the app is not working for his needs.  He would like more of a "personal" touch where he can interact with "trainers" about his exercise program.  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 Pt is fearful that he may harm himself by doing too much.   Advised pt that I will discharge him from the app and once we are able to schedule for in facility cardiac rehab he will be contacted.  Up until that point he should continue to walk as tolerated daily.  Pt remarked that he walked 2 1/2 miles twice today.  Reluctance with the handweights.  Reminded him of the no more than 10 pounds.  Pt thanked me for the call. Cherre Huger, BSN Cardiac and Training and development officer

## 2019-01-05 ENCOUNTER — Other Ambulatory Visit: Payer: Self-pay

## 2019-01-05 NOTE — Telephone Encounter (Signed)
Will route to [facility] w/ cardiac rehab to review and advise on any new updates. Thanks.

## 2019-01-06 ENCOUNTER — Telehealth: Payer: Self-pay | Admitting: *Deleted

## 2019-01-06 ENCOUNTER — Ambulatory Visit (INDEPENDENT_AMBULATORY_CARE_PROVIDER_SITE_OTHER): Payer: Medicare HMO | Admitting: Psychology

## 2019-01-06 ENCOUNTER — Telehealth: Payer: Self-pay | Admitting: Physician Assistant

## 2019-01-06 DIAGNOSIS — F411 Generalized anxiety disorder: Secondary | ICD-10-CM | POA: Diagnosis not present

## 2019-01-06 NOTE — Telephone Encounter (Signed)
New message ° ° °Patient is returning call for heart monitor results. Please call. °

## 2019-01-06 NOTE — Telephone Encounter (Signed)
See result note on monitor.  Pt to call back

## 2019-01-06 NOTE — Telephone Encounter (Signed)
I spoke with pt and reviewed monitor results with him.  He did not tolerate low dose metoprolol and does not want to have prn dose at this time.  I told him he could call back if needed and prescription for prn lopressor could be sent in.  I reviewed vagal maneuvers with him.

## 2019-01-06 NOTE — Telephone Encounter (Signed)
-----   Message from Burnell Blanks, MD sent at 01/06/2019  8:14 AM EDT ----- Can we let him know that his monitor did not show atrial fib. It does show very quick bursts of SVT (longest 9 beats so basically these last for about 2-3 seconds only). We can give him Lopressor 25 mg to use only if his heart rate is high. Also review vagal maneuvers. Thanks, chris

## 2019-01-06 NOTE — Telephone Encounter (Signed)
See other phone note from today.

## 2019-01-09 ENCOUNTER — Telehealth (HOSPITAL_COMMUNITY): Payer: Self-pay

## 2019-01-09 NOTE — Telephone Encounter (Signed)
Pt insurance is active and benefits verified through [facility]. Co-pay $10.00, DED $0.00/$0.00 met, out of pocket $3,400.00/$493.69 met, co-insurance 0%. No pre-authorization required. Passport, 01/09/2019 @ 3:58PM, [phone]

## 2019-01-10 NOTE — Telephone Encounter (Signed)
Opened in error

## 2019-01-18 ENCOUNTER — Telehealth (HOSPITAL_COMMUNITY): Payer: Self-pay

## 2019-01-18 ENCOUNTER — Other Ambulatory Visit: Payer: Self-pay

## 2019-01-18 ENCOUNTER — Ambulatory Visit (INDEPENDENT_AMBULATORY_CARE_PROVIDER_SITE_OTHER): Payer: Self-pay | Admitting: Surgery

## 2019-01-18 VITALS — BP 160/94 | HR 69 | Temp 97.7°F | Resp 20 | Ht 69.0 in | Wt 171.0 lb

## 2019-01-18 DIAGNOSIS — I350 Nonrheumatic aortic (valve) stenosis: Secondary | ICD-10-CM

## 2019-01-18 DIAGNOSIS — Z952 Presence of prosthetic heart valve: Secondary | ICD-10-CM

## 2019-01-19 ENCOUNTER — Telehealth (HOSPITAL_COMMUNITY): Payer: Self-pay

## 2019-01-19 ENCOUNTER — Encounter: Payer: Self-pay | Admitting: Surgery

## 2019-01-19 ENCOUNTER — Telehealth: Payer: Self-pay | Admitting: Physician Assistant

## 2019-01-19 DIAGNOSIS — Z952 Presence of prosthetic heart valve: Secondary | ICD-10-CM

## 2019-01-19 NOTE — Progress Notes (Signed)
     HPI: Patient returns for routine postoperative follow-up having undergone aortic valve replacement using a 23 mm Edwards pericardial valve on 11/10/2018.  He is here today with his partner, [name].   The patient's early postoperative recovery while in the hospital was notable for development of postoperative atrial fibrillation converted with amiodarone.  This was subsequently stopped as an outpatient due to nausea. Since he was last seen in our office he has continued to improve.  He has been walking a lot without chest pain or shortness of breath.  His main complaint is sensitivity along the incision for which she has to pull his shirt away from the chest.  He asked about using topical lidocaine or Mederma which I think is fine at this point.  He has had some problems with anxiety and panic attacks and has been seeing Dr. Cheryln Manly.    Current Outpatient Medications  Medication Sig Dispense Refill  . aspirin EC 81 MG EC tablet Take 1 tablet (81 mg total) by mouth daily.    . traZODone (DESYREL) 50 MG tablet Take 50 mg by mouth at bedtime as needed (sleep).      No current facility-administered medications for this visit.     Physical Exam: BP (!) 160/94   Pulse 69   Temp 97.7 F (36.5 C) (Skin)   Resp 20   Ht 5\' 9"  (1.753 m)   Wt 171 lb (77.6 kg)   SpO2 95%   BMI 25.25 kg/m  He looks well. Cardiac exam shows regular rate and rhythm with normal heart sounds.  There is a 1/6 systolic flow murmur across the prosthetic aortic valve.  There is no diastolic murmur. Lungs are clear. The incision is healing well and sternum is stable. There is no peripheral edema.  Diagnostic Tests:  None today.  His last chest x-ray on 12/06/2018 was clear.  Impression:  Overall I think he is doing very well 2 months following aortic valve replacement.  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  I would expect his chest wall sensitivity to continue improving over time but may take 6 months or more to completely resolve.  I think a lot of his anxiety will resolve as he gets back to his normal routine of running and weightlifting and he sees that his stamina and strength are improving.  Plan:  He is going to follow-up with Dr. Angelena Form and Dr. Shelia Media.  He will contact me if he develops any further problems with his chest incision.     Gaye Pollack, MD [facility] [phone]

## 2019-01-19 NOTE — Telephone Encounter (Signed)
Call placed to pt re: Echocardiogram. Pt is aware that we would like to repeat this July 30 or a week or so later and that someone from the scheduling dept will contact him to set this up.  Pt was very grateful for the follow-up he has received.

## 2019-01-19 NOTE — Telephone Encounter (Signed)
Cardiac Rehab Medication Review by a Pharmacist  Does the patient  feel that his/her medications are working for him/her?  yes  Has the patient been experiencing any side effects to the medications prescribed?  no  Does the patient measure his/her own blood pressure or blood glucose at home?  no   Does the patient have any problems obtaining medications due to transportation or finances?   no  Understanding of regimen: good Understanding of indications: good Potential of compliance: good    Pharmacist comments: N/A   Cristela Felt, PharmD PGY1 Pharmacy Resident [employer]: [phone]

## 2019-01-19 NOTE — Telephone Encounter (Signed)
   Since patient is approaching 3 months post-op (surgery date 4/30), I discussed his case with Dr. Angelena Form about timing of f/u echo now that we have re-opened things with procedures in place for safety. He agrees 3 months would be good time to recheck baseline post op echo. Can you please arrange for on/after 02/09/19? Thanks, Melina Copa PA-C

## 2019-01-24 ENCOUNTER — Ambulatory Visit (HOSPITAL_COMMUNITY): Payer: Medicare HMO

## 2019-01-24 ENCOUNTER — Other Ambulatory Visit: Payer: Self-pay

## 2019-01-24 ENCOUNTER — Other Ambulatory Visit (HOSPITAL_COMMUNITY): Payer: Medicare HMO

## 2019-01-24 ENCOUNTER — Encounter (HOSPITAL_COMMUNITY)
Admission: RE | Admit: 2019-01-24 | Discharge: 2019-01-24 | Disposition: A | Discharge status: Home / Self Care | Payer: Medicare HMO | Source: Ambulatory Visit | Attending: Cardiovascular Disease | Admitting: Cardiovascular Disease

## 2019-01-24 VITALS — BP 130/80 | HR 58 | Ht 67.75 in | Wt 173.7 lb

## 2019-01-24 DIAGNOSIS — Z9889 Other specified postprocedural states: Secondary | ICD-10-CM

## 2019-01-24 DIAGNOSIS — Z952 Presence of prosthetic heart valve: Secondary | ICD-10-CM | POA: Insufficient documentation

## 2019-01-24 NOTE — Progress Notes (Signed)
Cardiac Individual Treatment Plan  Patient Details  Name: Joshua Hess MRN: [id number] Date of Birth: 10-28-1940 Referring Provider:     CARDIAC REHAB PHASE II ORIENTATION from 01/24/2019 in [place]  Referring Provider  Lauree Chandler, MD      Initial Encounter Date:    CARDIAC REHAB PHASE II ORIENTATION from 01/24/2019 in [place]  Date  01/24/19      Visit Diagnosis: S/P aortic valve replacement 11/10/18  Patient's Home Medications on Admission:  Current Outpatient Medications:  .  aspirin EC 81 MG EC tablet, Take 1 tablet (81 mg total) by mouth daily., Disp: , Rfl:  .  traZODone (DESYREL) 50 MG tablet, Take 50 mg by mouth at bedtime as needed (sleep). , Disp: , Rfl:   Past Medical History: Past Medical History:  Diagnosis Date  . BPH (benign prostatic hyperplasia)   . Cancer ([place])    behind ear- [place]  . Carotid artery stenosis   . Chronotropic incompetence   . ED (erectile dysfunction)   . Elevated PSA   . HOH (hard of hearing)   . Mild CAD   . Postoperative atrial fibrillation ([place])   . Severe aortic stenosis    a. s/p tissue AVR 10/2018.  Marland Kitchen Sinus bradycardia    baseline  . Wears glasses     Tobacco Use: Social History   Tobacco Use  Smoking Status Former Smoker  . Years: 1.00  . Quit date: 13  . Years since quitting: 50.5  Smokeless Tobacco Never Used    Labs: Recent Chemical engineer    Labs for ITP Cardiac and Pulmonary Rehab Latest Ref Rng & Units 11/08/2018 11/10/2018 11/10/2018 11/10/2018 11/10/2018   Hemoglobin A1c 4.8 - 5.6 % - - - - -   PHART 7.350 - 7.450 7.380 7.390 7.404 7.510(H) 7.336(L)   PCO2ART 32.0 - 48.0 mmHg 54.4(H) 44.1 46.2 30.4(L) 46.7   HCO3 20.0 - 28.0 mmol/L 32.2(H) 26.6 28.7(H) 24.2 25.0   TCO2 22 - 32 mmol/L 34(H) 28 30 25 26    ACIDBASEDEF 0.0 - 2.0 mmol/L - - - - 1.0   O2SAT % 100.0 98.0 99.0 99.0 99.0      Capillary Blood Glucose: Lab  Results  Component Value Date   GLUCAP 116 (H) 11/11/2018   GLUCAP 113 (H) 11/11/2018   GLUCAP 121 (H) 11/11/2018   GLUCAP 130 (H) 11/10/2018   GLUCAP 121 (H) 11/10/2018     Exercise Target Goals: Exercise Program Goal: Individual exercise prescription set using results from initial 6 min walk test and THRR while considering  patient's activity barriers and safety.   Exercise Prescription Goal: Starting with aerobic activity 30 plus minutes a day, 3 days per week for initial exercise prescription. Provide home exercise prescription and guidelines that participant acknowledges understanding prior to discharge.  Activity Barriers & Risk Stratification: Activity Barriers & Cardiac Risk Stratification - 01/24/19 1529      Activity Barriers & Cardiac Risk Stratification   Activity Barriers  None;Other (comment)    Comments  Minor hip pain    Cardiac Risk Stratification  Moderate       6 Minute Walk: 6 Minute Walk    Row Name 01/24/19 1510         6 Minute Walk   Phase  Initial     Distance  1972 feet     Walk Time  6 minutes     # of Rest Breaks  0     MPH  3.73     METS  3.74     RPE  9     Perceived Dyspnea   0     VO2 Peak  13.08     Symptoms  No     Resting HR  58 bpm     Resting BP  130/80     Resting Oxygen Saturation   97 %     Exercise Oxygen Saturation  during 6 min walk  97 %     Max Ex. HR  97 bpm     Max Ex. BP  140/78     2 Minute Post BP  120/82        Oxygen Initial Assessment:   Oxygen Re-Evaluation:   Oxygen Discharge (Final Oxygen Re-Evaluation):   Initial Exercise Prescription: Initial Exercise Prescription - 01/24/19 1500      Date of Initial Exercise RX and Referring Provider   Date  01/24/19    Referring Provider  Lauree Chandler, MD    Expected Discharge Date  03/13/19      Treadmill   MPH  3    Grade  0    Minutes  15    METs  3.3      NuStep   Level  3    SPM  85    Minutes  15    METs  3      Prescription  Details   Frequency (times per week)  3    Duration  Progress to 30 minutes of continuous aerobic without signs/symptoms of physical distress      Intensity   THRR 40-80% of Max Heartrate  57-114    Ratings of Perceived Exertion  11-13    Perceived Dyspnea  0-4      Progression   Progression  Continue to progress workloads to maintain intensity without signs/symptoms of physical distress.      Resistance Training   Training Prescription  Yes    Weight  3lbs    Reps  10-15       Perform Capillary Blood Glucose checks as needed.  Exercise Prescription Changes:   Exercise Comments:   Exercise Goals and Review: Exercise Goals    Row Name 01/24/19 1530             Exercise Goals   Increase Physical Activity  Yes       Intervention  Provide advice, education, support and counseling about physical activity/exercise needs.;Develop an individualized exercise prescription for aerobic and resistive training based on initial evaluation findings, risk stratification, comorbidities and participant's personal goals.       Expected Outcomes  Short Term: Attend rehab on a regular basis to increase amount of physical activity.;Long Term: Exercising regularly at least 3-5 days a week.;Long Term: Add in home exercise to make exercise part of routine and to increase amount of physical activity.       Increase Strength and Stamina  Yes       Intervention  Provide advice, education, support and counseling about physical activity/exercise needs.;Develop an individualized exercise prescription for aerobic and resistive training based on initial evaluation findings, risk stratification, comorbidities and participant's personal goals.       Expected Outcomes  Short Term: Increase workloads from initial exercise prescription for resistance, speed, and METs.;Short Term: Perform resistance training exercises routinely during rehab and add in resistance training at home;Long Term: Improve cardiorespiratory  fitness, muscular endurance and strength as measured by  increased METs and functional capacity (6MWT)       Able to understand and use rate of perceived exertion (RPE) scale  Yes       Intervention  Provide education and explanation on how to use RPE scale       Expected Outcomes  Short Term: Able to use RPE daily in rehab to express subjective intensity level;Long Term:  Able to use RPE to guide intensity level when exercising independently       Knowledge and understanding of Target Heart Rate Range (THRR)  Yes       Intervention  Provide education and explanation of THRR including how the numbers were predicted and where they are located for reference       Expected Outcomes  Short Term: Able to state/look up THRR;Long Term: Able to use THRR to govern intensity when exercising independently;Short Term: Able to use daily as guideline for intensity in rehab       Able to check pulse independently  Yes       Intervention  Provide education and demonstration on how to check pulse in carotid and radial arteries.;Review the importance of being able to check your own pulse for safety during independent exercise       Expected Outcomes  Short Term: Able to explain why pulse checking is important during independent exercise;Long Term: Able to check pulse independently and accurately       Understanding of Exercise Prescription  Yes       Intervention  Provide education, explanation, and written materials on patient's individual exercise prescription       Expected Outcomes  Short Term: Able to explain program exercise prescription;Long Term: Able to explain home exercise prescription to exercise independently          Exercise Goals Re-Evaluation :    Discharge Exercise Prescription (Final Exercise Prescription Changes):   Nutrition:  Target Goals: Understanding of nutrition guidelines, daily intake of sodium 1500mg , cholesterol 200mg , calories 30% from fat and 7% or less from saturated fats,  daily to have 5 or more servings of fruits and vegetables.  Biometrics: Pre Biometrics - 01/24/19 1458      Pre Biometrics   Height  5' 7.75" (1.721 m)    Weight  78.8 kg    Waist Circumference  36 inches    Hip Circumference  40 inches    Waist to Hip Ratio  0.9 %    BMI (Calculated)  26.61    Triceps Skinfold  25 mm    % Body Fat  27.7 %    Grip Strength  36 kg    Flexibility  13 in    Single Leg Stand  27.31 seconds        Nutrition Therapy Plan and Nutrition Goals:   Nutrition Assessments:   Nutrition Goals Re-Evaluation:   Nutrition Goals Discharge (Final Nutrition Goals Re-Evaluation):   Psychosocial: Target Goals: Acknowledge presence or absence of significant depression and/or stress, maximize coping skills, provide positive support system. Participant is able to verbalize types and ability to use techniques and skills needed for reducing stress and depression.  Initial Review & Psychosocial Screening: Initial Psych Review & Screening - 01/24/19 1648      Initial Review   Current issues with  None Identified      Family Dynamics   Good Support System?  Yes   Samrat has his partner [name] for support     Barriers   Psychosocial barriers to participate in  program  There are no identifiable barriers or psychosocial needs.      Screening Interventions   Interventions  Encouraged to exercise       Quality of Life Scores: Quality of Life - 01/24/19 1545      Quality of Life   Select  Quality of Life      Quality of Life Scores   Health/Function Pre  24.87 %    Socioeconomic Pre  25.57 %    Psych/Spiritual Pre  24.75 %    Family Pre  27.88 %    GLOBAL Pre  25.38 %      Scores of 19 and below usually indicate a poorer quality of life in these areas.  A difference of  2-3 points is a clinically meaningful difference.  A difference of 2-3 points in the total score of the Quality of Life Index has been associated with significant improvement in overall  quality of life, self-image, physical symptoms, and general health in studies assessing change in quality of life.  PHQ-9: Recent Review Flowsheet Data    Depression screen [facility] 2/9 01/24/2019   Decreased Interest 0   Down, Depressed, Hopeless 0   PHQ - 2 Score 0     Interpretation of Total Score  Total Score Depression Severity:  1-4 = Minimal depression, 5-9 = Mild depression, 10-14 = Moderate depression, 15-19 = Moderately severe depression, 20-27 = Severe depression   Psychosocial Evaluation and Intervention:   Psychosocial Re-Evaluation:   Psychosocial Discharge (Final Psychosocial Re-Evaluation):   Vocational Rehabilitation: Provide vocational rehab assistance to qualifying candidates.   Vocational Rehab Evaluation & Intervention: Vocational Rehab - 01/24/19 1649      Initial Vocational Rehab Evaluation & Intervention   Assessment shows need for Vocational Rehabilitation  No       Education: Education Goals: Education classes will be provided on a weekly basis, covering required topics. Participant will state understanding/return demonstration of topics presented.  Learning Barriers/Preferences: Learning Barriers/Preferences - 01/24/19 1550      Learning Barriers/Preferences   Learning Barriers  Hearing    Learning Preferences  Written Material       Education Topics: Hypertension, Hypertension Reduction -Define heart disease and high blood pressure. Discus how high blood pressure affects the body and ways to reduce high blood pressure.   Exercise and Your Heart -Discuss why it is important to exercise, the FITT principles of exercise, normal and abnormal responses to exercise, and how to exercise safely.   Angina -Discuss definition of angina, causes of angina, treatment of angina, and how to decrease risk of having angina.   Cardiac Medications -Review what the following cardiac medications are used for, how they affect the body, and side effects that  may occur when taking the medications.  Medications include Aspirin, Beta blockers, calcium channel blockers, ACE Inhibitors, angiotensin receptor blockers, diuretics, digoxin, and antihyperlipidemics.   Congestive Heart Failure -Discuss the definition of CHF, how to live with CHF, the signs and symptoms of CHF, and how keep track of weight and sodium intake.   Heart Disease and Intimacy -Discus the effect sexual activity has on the heart, how changes occur during intimacy as we age, and safety during sexual activity.   Smoking Cessation / COPD -Discuss different methods to quit smoking, the health benefits of quitting smoking, and the definition of COPD.   Nutrition I: Fats -Discuss the types of cholesterol, what cholesterol does to the heart, and how cholesterol levels can be controlled.   Nutrition  II: Labels -Discuss the different components of food labels and how to read food label   Heart Parts/Heart Disease and PAD -Discuss the anatomy of the heart, the pathway of blood circulation through the heart, and these are affected by heart disease.   Stress I: Signs and Symptoms -Discuss the causes of stress, how stress may lead to anxiety and depression, and ways to limit stress.   Stress II: Relaxation -Discuss different types of relaxation techniques to limit stress.   Warning Signs of Stroke / TIA -Discuss definition of a stroke, what the signs and symptoms are of a stroke, and how to identify when someone is having stroke.   Knowledge Questionnaire Score: Knowledge Questionnaire Score - 01/24/19 1545      Knowledge Questionnaire Score   Pre Score  17/24       Core Components/Risk Factors/Patient Goals at Admission: Personal Goals and Risk Factors at Admission - 01/24/19 1546      Core Components/Risk Factors/Patient Goals on Admission   Stress  Yes    Intervention  Offer individual and/or small group education and counseling on adjustment to heart disease,  stress management and health-related lifestyle change. Teach and support self-help strategies.;Refer participants experiencing significant psychosocial distress to appropriate mental health specialists for further evaluation and treatment. When possible, include family members and significant others in education/counseling sessions.    Expected Outcomes  Short Term: Participant demonstrates changes in health-related behavior, relaxation and other stress management skills, ability to obtain effective social support, and compliance with psychotropic medications if prescribed.;Long Term: Emotional wellbeing is indicated by absence of clinically significant psychosocial distress or social isolation.    Personal Goal Other  Yes    Personal Goal  Increase running mileage. Be able to run a marathon.    Intervention  Develop an individualized exercise routine that patient can participate in at home to help achieve increased stamina and endurance, so that patient will be able increase running distance.    Expected Outcomes  Patient will be able to increase running distance and endurance.       Core Components/Risk Factors/Patient Goals Review:    Core Components/Risk Factors/Patient Goals at Discharge (Final Review):    ITP Comments: ITP Comments    Row Name 01/24/19 1445           ITP Comments  Dr. Fransico Him,  Medical Director          Comments: Patient attended orientation on 01/24/2019 to review rules and guidelines for program.  Completed 6 minute walk test, Intitial ITP, and exercise prescription.  VSS. Telemetry-Sinus Rhythm with PVC's and PAC's.  Asymptomatic. Safety measures and social distancing in place per CDC guidelines.Barnet Pall, RN,BSN 01/24/2019 4:52 PM

## 2019-01-26 ENCOUNTER — Ambulatory Visit (HOSPITAL_COMMUNITY): Payer: Medicare HMO | Attending: Internal Medicine

## 2019-01-26 ENCOUNTER — Other Ambulatory Visit: Payer: Self-pay

## 2019-01-26 DIAGNOSIS — Z952 Presence of prosthetic heart valve: Secondary | ICD-10-CM | POA: Diagnosis not present

## 2019-01-26 DIAGNOSIS — Z0000 Encounter for general adult medical examination without abnormal findings: Secondary | ICD-10-CM | POA: Diagnosis not present

## 2019-01-26 DIAGNOSIS — Z125 Encounter for screening for malignant neoplasm of prostate: Secondary | ICD-10-CM | POA: Diagnosis not present

## 2019-01-28 ENCOUNTER — Other Ambulatory Visit: Payer: Self-pay | Admitting: Physician Assistant

## 2019-01-30 ENCOUNTER — Other Ambulatory Visit: Payer: Self-pay

## 2019-01-30 ENCOUNTER — Encounter (HOSPITAL_COMMUNITY)
Admission: RE | Admit: 2019-01-30 | Discharge: 2019-01-30 | Disposition: A | Discharge status: Home / Self Care | Payer: Medicare HMO | Source: Ambulatory Visit | Attending: Cardiovascular Disease | Admitting: Cardiovascular Disease

## 2019-01-30 ENCOUNTER — Encounter (HOSPITAL_COMMUNITY): Payer: Medicare HMO

## 2019-01-30 DIAGNOSIS — Z9889 Other specified postprocedural states: Secondary | ICD-10-CM

## 2019-01-30 DIAGNOSIS — Z952 Presence of prosthetic heart valve: Secondary | ICD-10-CM | POA: Diagnosis not present

## 2019-01-30 NOTE — Progress Notes (Signed)
Daily Session Note  Patient Details  Name: Joshua Hess MRN: [id number] Date of Birth: 05-01-1941 Referring Provider:     CARDIAC REHAB PHASE II ORIENTATION from 01/24/2019 in [place]  Referring Provider  Lauree Chandler, MD      Encounter Date: 01/30/2019  Check In: Session Check In - 01/30/19 [place] physician immediately available to respond to emergencies  [facility] immediately available    Physician(s)  Dr. Earnest Conroy    Location  [facility]    Staff Present  Barnet Pall, RN, BSN;Brittany Durene Fruits, BS, ACSM CEP, Exercise Physiologist;Tyara Carol Ada, MS,ACSM CEP, Exercise Physiologist;Carollee Nussbaumer Karle Starch, RN, BSN    Virtual Visit  No    Medication changes reported      No    Fall or balance concerns reported     No    Tobacco Cessation  No Change    Warm-up and Cool-down  Performed on first and last piece of equipment    Resistance Training Performed  Yes    VAD Patient?  No    PAD/SET Patient?  No      Pain Assessment   Currently in Pain?  No/denies    Multiple Pain Sites  No       Capillary Blood Glucose: No results found for this or any previous visit (from the past 24 hour(s)).  Exercise Prescription Changes - 01/30/19 1600      Response to Exercise   Blood Pressure (Admit)  118/78    Blood Pressure (Exercise)  130/78    Blood Pressure (Exit)  124/72    Heart Rate (Admit)  69 bpm    Heart Rate (Exercise)  102 bpm    Heart Rate (Exit)  71 bpm    Rating of Perceived Exertion (Exercise)  13    Symptoms  None    Comments  Pt first day of exercise.     Duration  Continue with 30 min of aerobic exercise without signs/symptoms of physical distress.    Intensity  THRR unchanged      Progression   Progression  Continue to progress workloads to maintain intensity without signs/symptoms of physical distress.    Average METs  2.7      Resistance Training   Training Prescription  Yes     Weight  3lbs    Reps  10-15    Time  10 Minutes      Treadmill   MPH  2.8    Grade  0    Minutes  15    METs  3.14      NuStep   Level  3    SPM  85    Minutes  15    METs  2.4       Social History   Tobacco Use  Smoking Status Former Smoker  . Years: 1.00  . Quit date: 26  . Years since quitting: 50.5  Smokeless Tobacco Never Used    Goals Met:  Exercise tolerated well  Goals Unmet:  Not Applicable  Comments: Pt started cardiac rehab today.  Pt tolerated light exercise without difficulty. VSS, telemetry-SR with PACs, asymptomatic.  Medication list reconciled. Pt denies barriers to medicaiton compliance.  PSYCHOSOCIAL ASSESSMENT:  PHQ-0. Pt exhibits positive coping skills, hopeful outlook with supportive family. No psychosocial needs identified at this time, no psychosocial interventions necessary.  Pt oriented to exercise equipment and routine.    Understanding verbalized.  Dr. Traci Turner is Medical Director for Cardiac Rehab at [facility]. 

## 2019-01-31 DIAGNOSIS — N3943 Post-void dribbling: Secondary | ICD-10-CM | POA: Diagnosis not present

## 2019-01-31 DIAGNOSIS — I6523 Occlusion and stenosis of bilateral carotid arteries: Secondary | ICD-10-CM | POA: Diagnosis not present

## 2019-01-31 DIAGNOSIS — F5104 Psychophysiologic insomnia: Secondary | ICD-10-CM | POA: Diagnosis not present

## 2019-01-31 DIAGNOSIS — Z0000 Encounter for general adult medical examination without abnormal findings: Secondary | ICD-10-CM | POA: Diagnosis not present

## 2019-01-31 DIAGNOSIS — Z952 Presence of prosthetic heart valve: Secondary | ICD-10-CM | POA: Diagnosis not present

## 2019-01-31 DIAGNOSIS — R5383 Other fatigue: Secondary | ICD-10-CM | POA: Diagnosis not present

## 2019-01-31 DIAGNOSIS — N401 Enlarged prostate with lower urinary tract symptoms: Secondary | ICD-10-CM | POA: Diagnosis not present

## 2019-01-31 DIAGNOSIS — R413 Other amnesia: Secondary | ICD-10-CM | POA: Diagnosis not present

## 2019-02-01 ENCOUNTER — Encounter (HOSPITAL_COMMUNITY)
Admission: RE | Admit: 2019-02-01 | Discharge: 2019-02-01 | Disposition: A | Discharge status: Home / Self Care | Payer: Medicare HMO | Source: Ambulatory Visit | Attending: Cardiovascular Disease | Admitting: Cardiovascular Disease

## 2019-02-01 ENCOUNTER — Ambulatory Visit: Payer: Medicare HMO | Admitting: Surgery

## 2019-02-01 ENCOUNTER — Encounter (HOSPITAL_COMMUNITY): Payer: Medicare HMO

## 2019-02-01 ENCOUNTER — Other Ambulatory Visit: Payer: Self-pay

## 2019-02-01 DIAGNOSIS — Z952 Presence of prosthetic heart valve: Secondary | ICD-10-CM | POA: Diagnosis not present

## 2019-02-01 DIAGNOSIS — Z9889 Other specified postprocedural states: Secondary | ICD-10-CM

## 2019-02-02 NOTE — Progress Notes (Signed)
Cardiac Individual Treatment Plan  Patient Details  Name: Joshua Hess MRN: [id number] Date of Birth: [birth year] Referring Provider:     CARDIAC REHAB PHASE II ORIENTATION from 01/24/2019 in [place]  Referring Provider  Lauree Chandler, MD      Initial Encounter Date:    CARDIAC REHAB PHASE II ORIENTATION from 01/24/2019 in [place]  Date  01/24/19      Visit Diagnosis: S/P aortic valve replacement 11/10/18  Patient's Home Medications on Admission:  Current Outpatient Medications:  .  aspirin EC 81 MG EC tablet, Take 1 tablet (81 mg total) by mouth daily., Disp: , Rfl:  .  traZODone (DESYREL) 50 MG tablet, Take 50 mg by mouth at bedtime as needed (sleep). , Disp: , Rfl:   Past Medical History: Past Medical History:  Diagnosis Date  . BPH (benign prostatic hyperplasia)   . Cancer ([place])    behind ear- [place]  . Carotid artery stenosis   . Chronotropic incompetence   . ED (erectile dysfunction)   . Elevated PSA   . HOH (hard of hearing)   . Mild CAD   . Postoperative atrial fibrillation ([place])   . Severe aortic stenosis    a. s/p tissue AVR 10/2018.  Marland Kitchen Sinus bradycardia    baseline  . Wears glasses     Tobacco Use: Social History   Tobacco Use  Smoking Status Former Smoker  . Years: 1.00  . Quit date: 48  . Years since quitting: 50.5  Smokeless Tobacco Never Used    Labs: Recent Chemical engineer    Labs for ITP Cardiac and Pulmonary Rehab Latest Ref Rng & Units 11/08/2018 11/10/2018 11/10/2018 11/10/2018 11/10/2018   Hemoglobin A1c 4.8 - 5.6 % - - - - -   PHART 7.350 - 7.450 7.380 7.390 7.404 7.510(H) 7.336(L)   PCO2ART 32.0 - 48.0 mmHg 54.4(H) 44.1 46.2 30.4(L) 46.7   HCO3 20.0 - 28.0 mmol/L 32.2(H) 26.6 28.7(H) 24.2 25.0   TCO2 22 - 32 mmol/L 34(H) 28 30 25 26    ACIDBASEDEF 0.0 - 2.0 mmol/L - - - - 1.0   O2SAT % 100.0 98.0 99.0 99.0 99.0      Capillary Blood Glucose: Lab  Results  Component Value Date   GLUCAP 116 (H) 11/11/2018   GLUCAP 113 (H) 11/11/2018   GLUCAP 121 (H) 11/11/2018   GLUCAP 130 (H) 11/10/2018   GLUCAP 121 (H) 11/10/2018     Exercise Target Goals: Exercise Program Goal: Individual exercise prescription set using results from initial 6 min walk test and THRR while considering  patient's activity barriers and safety.   Exercise Prescription Goal: Initial exercise prescription builds to 30-45 minutes a day of aerobic activity, 2-3 days per week.  Home exercise guidelines will be given to patient during program as part of exercise prescription that the participant will acknowledge.  Activity Barriers & Risk Stratification: Activity Barriers & Cardiac Risk Stratification - 01/24/19 1529      Activity Barriers & Cardiac Risk Stratification   Activity Barriers  None;Other (comment)    Comments  Minor hip pain    Cardiac Risk Stratification  Moderate       6 Minute Walk: 6 Minute Walk    Row Name 01/24/19 1510         6 Minute Walk   Phase  Initial     Distance  1972 feet     Walk Time  6 minutes     #  of Rest Breaks  0     MPH  3.73     METS  3.74     RPE  9     Perceived Dyspnea   0     VO2 Peak  13.08     Symptoms  No     Resting HR  58 bpm     Resting BP  130/80     Resting Oxygen Saturation   97 %     Exercise Oxygen Saturation  during 6 min walk  97 %     Max Ex. HR  97 bpm     Max Ex. BP  140/78     2 Minute Post BP  120/82        Oxygen Initial Assessment:   Oxygen Re-Evaluation:   Oxygen Discharge (Final Oxygen Re-Evaluation):   Initial Exercise Prescription: Initial Exercise Prescription - 01/24/19 1500      Date of Initial Exercise RX and Referring Provider   Date  01/24/19    Referring Provider  Lauree Chandler, MD    Expected Discharge Date  03/13/19      Treadmill   MPH  3    Grade  0    Minutes  15    METs  3.3      NuStep   Level  3    SPM  85    Minutes  15    METs  3       Prescription Details   Frequency (times per week)  3    Duration  Progress to 30 minutes of continuous aerobic without signs/symptoms of physical distress      Intensity   THRR 40-80% of Max Heartrate  57-114    Ratings of Perceived Exertion  11-13    Perceived Dyspnea  0-4      Progression   Progression  Continue to progress workloads to maintain intensity without signs/symptoms of physical distress.      Resistance Training   Training Prescription  Yes    Weight  3lbs    Reps  10-15       Perform Capillary Blood Glucose checks as needed.  Exercise Prescription Changes: Exercise Prescription Changes    Row Name 01/30/19 1600             Response to Exercise   Blood Pressure (Admit)  118/78       Blood Pressure (Exercise)  130/78       Blood Pressure (Exit)  124/72       Heart Rate (Admit)  69 bpm       Heart Rate (Exercise)  102 bpm       Heart Rate (Exit)  71 bpm       Rating of Perceived Exertion (Exercise)  13       Symptoms  None       Comments  Pt first day of exercise.        Duration  Continue with 30 min of aerobic exercise without signs/symptoms of physical distress.       Intensity  THRR unchanged         Progression   Progression  Continue to progress workloads to maintain intensity without signs/symptoms of physical distress.       Average METs  2.7         Resistance Training   Training Prescription  Yes       Weight  3lbs       Reps  10-15  Time  10 Minutes         Treadmill   MPH  2.8       Grade  0       Minutes  15       METs  3.14         NuStep   Level  3       SPM  85       Minutes  15       METs  2.4          Exercise Comments: Exercise Comments    Row Name 01/30/19 1623           Exercise Comments  Pt first day of exercise. Tolerated exercise well.          Exercise Goals and Review: Exercise Goals    Row Name 01/24/19 1530             Exercise Goals   Increase Physical Activity  Yes        Intervention  Provide advice, education, support and counseling about physical activity/exercise needs.;Develop an individualized exercise prescription for aerobic and resistive training based on initial evaluation findings, risk stratification, comorbidities and participant's personal goals.       Expected Outcomes  Short Term: Attend rehab on a regular basis to increase amount of physical activity.;Long Term: Exercising regularly at least 3-5 days a week.;Long Term: Add in home exercise to make exercise part of routine and to increase amount of physical activity.       Increase Strength and Stamina  Yes       Intervention  Provide advice, education, support and counseling about physical activity/exercise needs.;Develop an individualized exercise prescription for aerobic and resistive training based on initial evaluation findings, risk stratification, comorbidities and participant's personal goals.       Expected Outcomes  Short Term: Increase workloads from initial exercise prescription for resistance, speed, and METs.;Short Term: Perform resistance training exercises routinely during rehab and add in resistance training at home;Long Term: Improve cardiorespiratory fitness, muscular endurance and strength as measured by increased METs and functional capacity (6MWT)       Able to understand and use rate of perceived exertion (RPE) scale  Yes       Intervention  Provide education and explanation on how to use RPE scale       Expected Outcomes  Short Term: Able to use RPE daily in rehab to express subjective intensity level;Long Term:  Able to use RPE to guide intensity level when exercising independently       Knowledge and understanding of Target Heart Rate Range (THRR)  Yes       Intervention  Provide education and explanation of THRR including how the numbers were predicted and where they are located for reference       Expected Outcomes  Short Term: Able to state/look up THRR;Long Term: Able to use  THRR to govern intensity when exercising independently;Short Term: Able to use daily as guideline for intensity in rehab       Able to check pulse independently  Yes       Intervention  Provide education and demonstration on how to check pulse in carotid and radial arteries.;Review the importance of being able to check your own pulse for safety during independent exercise       Expected Outcomes  Short Term: Able to explain why pulse checking is important during independent exercise;Long Term: Able to check pulse independently and  accurately       Understanding of Exercise Prescription  Yes       Intervention  Provide education, explanation, and written materials on patient's individual exercise prescription       Expected Outcomes  Short Term: Able to explain program exercise prescription;Long Term: Able to explain home exercise prescription to exercise independently          Exercise Goals Re-Evaluation : Exercise Goals Re-Evaluation    Row Name 01/30/19 1622             Exercise Goal Re-Evaluation   Exercise Goals Review  Increase Physical Activity;Increase Strength and Stamina;Able to understand and use rate of perceived exertion (RPE) scale;Knowledge and understanding of Target Heart Rate Range (THRR);Understanding of Exercise Prescription       Comments  Pt first day of exercise in CR program. Pt tolerated exercise prescription well.       Expected Outcomes  Will continue to monitor and progress Pt as tolerated.          Discharge Exercise Prescription (Final Exercise Prescription Changes): Exercise Prescription Changes - 01/30/19 1600      Response to Exercise   Blood Pressure (Admit)  118/78    Blood Pressure (Exercise)  130/78    Blood Pressure (Exit)  124/72    Heart Rate (Admit)  69 bpm    Heart Rate (Exercise)  102 bpm    Heart Rate (Exit)  71 bpm    Rating of Perceived Exertion (Exercise)  13    Symptoms  None    Comments  Pt first day of exercise.     Duration   Continue with 30 min of aerobic exercise without signs/symptoms of physical distress.    Intensity  THRR unchanged      Progression   Progression  Continue to progress workloads to maintain intensity without signs/symptoms of physical distress.    Average METs  2.7      Resistance Training   Training Prescription  Yes    Weight  3lbs    Reps  10-15    Time  10 Minutes      Treadmill   MPH  2.8    Grade  0    Minutes  15    METs  3.14      NuStep   Level  3    SPM  85    Minutes  15    METs  2.4       Nutrition:  Target Goals: Understanding of nutrition guidelines, daily intake of sodium 1500mg , cholesterol 200mg , calories 30% from fat and 7% or less from saturated fats, daily to have 5 or more servings of fruits and vegetables.  Biometrics: Pre Biometrics - 01/24/19 1458      Pre Biometrics   Height  5' 7.75" (1.721 m)    Weight  78.8 kg    Waist Circumference  36 inches    Hip Circumference  40 inches    Waist to Hip Ratio  0.9 %    BMI (Calculated)  26.61    Triceps Skinfold  25 mm    % Body Fat  27.7 %    Grip Strength  36 kg    Flexibility  13 in    Single Leg Stand  27.31 seconds        Nutrition Therapy Plan and Nutrition Goals:   Nutrition Assessments:   Nutrition Goals Re-Evaluation:   Nutrition Goals Re-Evaluation:   Nutrition Goals Discharge (Final Nutrition  Goals Re-Evaluation):   Psychosocial: Target Goals: Acknowledge presence or absence of significant depression and/or stress, maximize coping skills, provide positive support system. Participant is able to verbalize types and ability to use techniques and skills needed for reducing stress and depression.  Initial Review & Psychosocial Screening: Initial Psych Review & Screening - 01/24/19 1648      Initial Review   Current issues with  None Identified      Family Dynamics   Good Support System?  Yes   Jung has his partner [name] for support     Barriers   Psychosocial barriers  to participate in program  There are no identifiable barriers or psychosocial needs.      Screening Interventions   Interventions  Encouraged to exercise       Quality of Life Scores: Quality of Life - 01/24/19 1545      Quality of Life   Select  Quality of Life      Quality of Life Scores   Health/Function Pre  24.87 %    Socioeconomic Pre  25.57 %    Psych/Spiritual Pre  24.75 %    Family Pre  27.88 %    GLOBAL Pre  25.38 %      Scores of 19 and below usually indicate a poorer quality of life in these areas.  A difference of  2-3 points is a clinically meaningful difference.  A difference of 2-3 points in the total score of the Quality of Life Index has been associated with significant improvement in overall quality of life, self-image, physical symptoms, and general health in studies assessing change in quality of life.  PHQ-9: Recent Review Flowsheet Data    Depression screen [facility] 2/9 01/24/2019   Decreased Interest 0   Down, Depressed, Hopeless 0   PHQ - 2 Score 0     Interpretation of Total Score  Total Score Depression Severity:  1-4 = Minimal depression, 5-9 = Mild depression, 10-14 = Moderate depression, 15-19 = Moderately severe depression, 20-27 = Severe depression   Psychosocial Evaluation and Intervention: Psychosocial Evaluation - 01/30/19 1623      Psychosocial Evaluation & Interventions   Interventions  Encouraged to exercise with the program and follow exercise prescription    Comments  No psychosocial needs identified.  Jerrol enjoys Haematologist and running.    Expected Outcomes  Arthur will maintain a positive outlook with good coping skills.    Continue Psychosocial Services   No Follow up required       Psychosocial Re-Evaluation:   Psychosocial Discharge (Final Psychosocial Re-Evaluation):   Vocational Rehabilitation: Provide vocational rehab assistance to qualifying candidates.   Vocational Rehab Evaluation & Intervention: Vocational Rehab -  01/24/19 1649      Initial Vocational Rehab Evaluation & Intervention   Assessment shows need for Vocational Rehabilitation  No       Education: Education Goals: Education classes will be provided on a weekly basis, covering required topics. Participant will state understanding/return demonstration of topics presented.  Learning Barriers/Preferences: Learning Barriers/Preferences - 01/24/19 1550      Learning Barriers/Preferences   Learning Barriers  Hearing    Learning Preferences  Written Material       Education Topics: Count Your Pulse:  -Group instruction provided by verbal instruction, demonstration, patient participation and written materials to support subject.  Instructors address importance of being able to find your pulse and how to count your pulse when at home without a heart monitor.  Patients get hands  on experience counting their pulse with staff help and individually.   Heart Attack, Angina, and Risk Factor Modification:  -Group instruction provided by verbal instruction, video, and written materials to support subject.  Instructors address signs and symptoms of angina and heart attacks.    Also discuss risk factors for heart disease and how to make changes to improve heart health risk factors.   Functional Fitness:  -Group instruction provided by verbal instruction, demonstration, patient participation, and written materials to support subject.  Instructors address safety measures for doing things around the house.  Discuss how to get up and down off the floor, how to pick things up properly, how to safely get out of a chair without assistance, and balance training.   Meditation and Mindfulness:  -Group instruction provided by verbal instruction, patient participation, and written materials to support subject.  Instructor addresses importance of mindfulness and meditation practice to help reduce stress and improve awareness.  Instructor also leads participants  through a meditation exercise.    Stretching for Flexibility and Mobility:  -Group instruction provided by verbal instruction, patient participation, and written materials to support subject.  Instructors lead participants through series of stretches that are designed to increase flexibility thus improving mobility.  These stretches are additional exercise for major muscle groups that are typically performed during regular warm up and cool down.   Hands Only CPR:  -Group verbal, video, and participation provides a basic overview of AHA guidelines for community CPR. Role-play of emergencies allow participants the opportunity to practice calling for help and chest compression technique with discussion of AED use.   Hypertension: -Group verbal and written instruction that provides a basic overview of hypertension including the most recent diagnostic guidelines, risk factor reduction with self-care instructions and medication management.    Nutrition I class: Heart Healthy Eating:  -Group instruction provided by PowerPoint slides, verbal discussion, and written materials to support subject matter. The instructor gives an explanation and review of the Therapeutic Lifestyle Changes diet recommendations, which includes a discussion on lipid goals, dietary fat, sodium, fiber, plant stanol/sterol esters, sugar, and the components of a well-balanced, healthy diet.   Nutrition II class: Lifestyle Skills:  -Group instruction provided by PowerPoint slides, verbal discussion, and written materials to support subject matter. The instructor gives an explanation and review of label reading, grocery shopping for heart health, heart healthy recipe modifications, and ways to make healthier choices when eating out.   Diabetes Question & Answer:  -Group instruction provided by PowerPoint slides, verbal discussion, and written materials to support subject matter. The instructor gives an explanation and review of  diabetes co-morbidities, pre- and post-prandial blood glucose goals, pre-exercise blood glucose goals, signs, symptoms, and treatment of hypoglycemia and hyperglycemia, and foot care basics.   Diabetes Blitz:  -Group instruction provided by PowerPoint slides, verbal discussion, and written materials to support subject matter. The instructor gives an explanation and review of the physiology behind type 1 and type 2 diabetes, diabetes medications and rational behind using different medications, pre- and post-prandial blood glucose recommendations and Hemoglobin A1c goals, diabetes diet, and exercise including blood glucose guidelines for exercising safely.    Portion Distortion:  -Group instruction provided by PowerPoint slides, verbal discussion, written materials, and food models to support subject matter. The instructor gives an explanation of serving size versus portion size, changes in portions sizes over the last 20 years, and what consists of a serving from each food group.   Stress Management:  -Group instruction provided  by verbal instruction, video, and written materials to support subject matter.  Instructors review role of stress in heart disease and how to cope with stress positively.     Exercising on Your Own:  -Group instruction provided by verbal instruction, power point, and written materials to support subject.  Instructors discuss benefits of exercise, components of exercise, frequency and intensity of exercise, and end points for exercise.  Also discuss use of nitroglycerin and activating EMS.  Review options of places to exercise outside of rehab.  Review guidelines for sex with heart disease.   Cardiac Drugs I:  -Group instruction provided by verbal instruction and written materials to support subject.  Instructor reviews cardiac drug classes: antiplatelets, anticoagulants, beta blockers, and statins.  Instructor discusses reasons, side effects, and lifestyle considerations  for each drug class.   Cardiac Drugs II:  -Group instruction provided by verbal instruction and written materials to support subject.  Instructor reviews cardiac drug classes: angiotensin converting enzyme inhibitors (ACE-I), angiotensin II receptor blockers (ARBs), nitrates, and calcium channel blockers.  Instructor discusses reasons, side effects, and lifestyle considerations for each drug class.   Anatomy and Physiology of the Circulatory System:  Group verbal and written instruction and models provide basic cardiac anatomy and physiology, with the coronary electrical and arterial systems. Review of: AMI, Angina, Valve disease, Heart Failure, Peripheral Artery Disease, Cardiac Arrhythmia, Pacemakers, and the ICD.   Other Education:  -Group or individual verbal, written, or video instructions that support the educational goals of the cardiac rehab program.   Holiday Eating Survival Tips:  -Group instruction provided by PowerPoint slides, verbal discussion, and written materials to support subject matter. The instructor gives patients tips, tricks, and techniques to help them not only survive but enjoy the holidays despite the onslaught of food that accompanies the holidays.   Knowledge Questionnaire Score: Knowledge Questionnaire Score - 01/24/19 1545      Knowledge Questionnaire Score   Pre Score  17/24       Core Components/Risk Factors/Patient Goals at Admission: Personal Goals and Risk Factors at Admission - 01/24/19 1546      Core Components/Risk Factors/Patient Goals on Admission   Stress  Yes    Intervention  Offer individual and/or small group education and counseling on adjustment to heart disease, stress management and health-related lifestyle change. Teach and support self-help strategies.;Refer participants experiencing significant psychosocial distress to appropriate mental health specialists for further evaluation and treatment. When possible, include family members  and significant others in education/counseling sessions.    Expected Outcomes  Short Term: Participant demonstrates changes in health-related behavior, relaxation and other stress management skills, ability to obtain effective social support, and compliance with psychotropic medications if prescribed.;Long Term: Emotional wellbeing is indicated by absence of clinically significant psychosocial distress or social isolation.    Personal Goal Other  Yes    Personal Goal  Increase running mileage. Be able to run a marathon.    Intervention  Develop an individualized exercise routine that patient can participate in at home to help achieve increased stamina and endurance, so that patient will be able increase running distance.    Expected Outcomes  Patient will be able to increase running distance and endurance.       Core Components/Risk Factors/Patient Goals Review:  Goals and Risk Factor Review    Row Name 01/30/19 1630             Core Components/Risk Factors/Patient Goals Review   Personal Goals Review  Stress;Other  Review  Pt with few multiple CAD RFs.  Ayodele would like to increase the amount of miles when he runs.       Expected Outcomes  Pt will continue to participate in CR exercise, nutrition, and lifestyle modification opportunities.          Core Components/Risk Factors/Patient Goals at Discharge (Final Review):  Goals and Risk Factor Review - 01/30/19 1630      Core Components/Risk Factors/Patient Goals Review   Personal Goals Review  Stress;Other    Review  Pt with few multiple CAD RFs.  Hyatt would like to increase the amount of miles when he runs.    Expected Outcomes  Pt will continue to participate in CR exercise, nutrition, and lifestyle modification opportunities.       ITP Comments: ITP Comments    Row Name 01/24/19 1445 01/30/19 1621         ITP Comments  Dr. Fransico Him,  Medical Director  30 Day ITP Review. Pt started exercise today and tolerated it  well.         Comments: See ITP Comments.

## 2019-02-03 ENCOUNTER — Other Ambulatory Visit: Payer: Self-pay

## 2019-02-03 ENCOUNTER — Encounter (HOSPITAL_COMMUNITY): Payer: Medicare HMO

## 2019-02-03 ENCOUNTER — Encounter (HOSPITAL_COMMUNITY)
Admission: RE | Admit: 2019-02-03 | Discharge: 2019-02-03 | Disposition: A | Discharge status: Home / Self Care | Payer: Medicare HMO | Source: Ambulatory Visit | Attending: Cardiovascular Disease | Admitting: Cardiovascular Disease

## 2019-02-03 DIAGNOSIS — Z9889 Other specified postprocedural states: Secondary | ICD-10-CM

## 2019-02-03 DIAGNOSIS — Z952 Presence of prosthetic heart valve: Secondary | ICD-10-CM | POA: Diagnosis not present

## 2019-02-06 ENCOUNTER — Encounter (HOSPITAL_COMMUNITY)
Admission: RE | Admit: 2019-02-06 | Discharge: 2019-02-06 | Disposition: A | Discharge status: Home / Self Care | Payer: Medicare HMO | Source: Ambulatory Visit | Attending: Cardiovascular Disease | Admitting: Cardiovascular Disease

## 2019-02-06 ENCOUNTER — Encounter (HOSPITAL_COMMUNITY): Payer: Medicare HMO

## 2019-02-06 ENCOUNTER — Other Ambulatory Visit: Payer: Self-pay

## 2019-02-06 DIAGNOSIS — Z952 Presence of prosthetic heart valve: Secondary | ICD-10-CM | POA: Diagnosis not present

## 2019-02-06 DIAGNOSIS — Z9889 Other specified postprocedural states: Secondary | ICD-10-CM

## 2019-02-08 ENCOUNTER — Encounter (HOSPITAL_COMMUNITY)
Admission: RE | Admit: 2019-02-08 | Discharge: 2019-02-08 | Disposition: A | Discharge status: Home / Self Care | Payer: Medicare HMO | Source: Ambulatory Visit | Attending: Cardiovascular Disease | Admitting: Cardiovascular Disease

## 2019-02-08 ENCOUNTER — Other Ambulatory Visit: Payer: Self-pay

## 2019-02-08 ENCOUNTER — Encounter (HOSPITAL_COMMUNITY): Payer: Medicare HMO

## 2019-02-08 DIAGNOSIS — Z9889 Other specified postprocedural states: Secondary | ICD-10-CM

## 2019-02-08 DIAGNOSIS — Z952 Presence of prosthetic heart valve: Secondary | ICD-10-CM | POA: Diagnosis not present

## 2019-02-10 ENCOUNTER — Encounter (HOSPITAL_COMMUNITY): Payer: Medicare HMO

## 2019-02-10 ENCOUNTER — Other Ambulatory Visit: Payer: Self-pay

## 2019-02-10 ENCOUNTER — Encounter (HOSPITAL_COMMUNITY)
Admission: RE | Admit: 2019-02-10 | Discharge: 2019-02-10 | Disposition: A | Discharge status: Home / Self Care | Payer: Medicare HMO | Source: Ambulatory Visit | Attending: Cardiovascular Disease | Admitting: Cardiovascular Disease

## 2019-02-10 DIAGNOSIS — Z9889 Other specified postprocedural states: Secondary | ICD-10-CM

## 2019-02-10 DIAGNOSIS — Z952 Presence of prosthetic heart valve: Secondary | ICD-10-CM | POA: Diagnosis not present

## 2019-02-13 ENCOUNTER — Encounter (HOSPITAL_COMMUNITY): Payer: Medicare HMO

## 2019-02-13 ENCOUNTER — Other Ambulatory Visit: Payer: Self-pay

## 2019-02-13 ENCOUNTER — Encounter (HOSPITAL_COMMUNITY)
Admission: RE | Admit: 2019-02-13 | Discharge: 2019-02-13 | Disposition: A | Discharge status: Home / Self Care | Payer: Medicare HMO | Source: Ambulatory Visit | Attending: Cardiovascular Disease | Admitting: Cardiovascular Disease

## 2019-02-13 DIAGNOSIS — Z952 Presence of prosthetic heart valve: Secondary | ICD-10-CM | POA: Insufficient documentation

## 2019-02-13 DIAGNOSIS — Z9889 Other specified postprocedural states: Secondary | ICD-10-CM

## 2019-02-15 ENCOUNTER — Other Ambulatory Visit: Payer: Self-pay

## 2019-02-15 ENCOUNTER — Encounter (HOSPITAL_COMMUNITY)
Admission: RE | Admit: 2019-02-15 | Discharge: 2019-02-15 | Disposition: A | Discharge status: Home / Self Care | Payer: Medicare HMO | Source: Ambulatory Visit | Attending: Cardiovascular Disease | Admitting: Cardiovascular Disease

## 2019-02-15 ENCOUNTER — Encounter (HOSPITAL_COMMUNITY): Payer: Medicare HMO

## 2019-02-15 DIAGNOSIS — Z952 Presence of prosthetic heart valve: Secondary | ICD-10-CM | POA: Diagnosis not present

## 2019-02-15 DIAGNOSIS — Z9889 Other specified postprocedural states: Secondary | ICD-10-CM

## 2019-02-17 ENCOUNTER — Other Ambulatory Visit: Payer: Self-pay

## 2019-02-17 ENCOUNTER — Encounter (HOSPITAL_COMMUNITY)
Admission: RE | Admit: 2019-02-17 | Discharge: 2019-02-17 | Disposition: A | Discharge status: Home / Self Care | Payer: Medicare HMO | Source: Ambulatory Visit | Attending: Cardiovascular Disease | Admitting: Cardiovascular Disease

## 2019-02-17 ENCOUNTER — Encounter (HOSPITAL_COMMUNITY): Payer: Medicare HMO

## 2019-02-17 DIAGNOSIS — Z9889 Other specified postprocedural states: Secondary | ICD-10-CM

## 2019-02-17 DIAGNOSIS — Z952 Presence of prosthetic heart valve: Secondary | ICD-10-CM | POA: Diagnosis not present

## 2019-02-20 ENCOUNTER — Encounter (HOSPITAL_COMMUNITY): Payer: Medicare HMO

## 2019-02-20 ENCOUNTER — Other Ambulatory Visit: Payer: Self-pay

## 2019-02-20 ENCOUNTER — Encounter (HOSPITAL_COMMUNITY)
Admission: RE | Admit: 2019-02-20 | Discharge: 2019-02-20 | Disposition: A | Discharge status: Home / Self Care | Payer: Medicare HMO | Source: Ambulatory Visit | Attending: Cardiovascular Disease | Admitting: Cardiovascular Disease

## 2019-02-20 DIAGNOSIS — Z952 Presence of prosthetic heart valve: Secondary | ICD-10-CM | POA: Diagnosis not present

## 2019-02-20 DIAGNOSIS — Z9889 Other specified postprocedural states: Secondary | ICD-10-CM

## 2019-02-22 ENCOUNTER — Encounter (HOSPITAL_COMMUNITY): Payer: Medicare HMO

## 2019-02-22 ENCOUNTER — Encounter (HOSPITAL_COMMUNITY)
Admission: RE | Admit: 2019-02-22 | Discharge: 2019-02-22 | Disposition: A | Discharge status: Home / Self Care | Payer: Medicare HMO | Source: Ambulatory Visit | Attending: Cardiovascular Disease | Admitting: Cardiovascular Disease

## 2019-02-22 ENCOUNTER — Other Ambulatory Visit: Payer: Self-pay

## 2019-02-22 DIAGNOSIS — Z9889 Other specified postprocedural states: Secondary | ICD-10-CM

## 2019-02-22 DIAGNOSIS — Z952 Presence of prosthetic heart valve: Secondary | ICD-10-CM | POA: Diagnosis not present

## 2019-02-24 ENCOUNTER — Encounter (HOSPITAL_COMMUNITY)
Admission: RE | Admit: 2019-02-24 | Discharge: 2019-02-24 | Disposition: A | Discharge status: Home / Self Care | Payer: Medicare HMO | Source: Ambulatory Visit | Attending: Cardiovascular Disease | Admitting: Cardiovascular Disease

## 2019-02-24 ENCOUNTER — Other Ambulatory Visit: Payer: Self-pay

## 2019-02-24 ENCOUNTER — Encounter (HOSPITAL_COMMUNITY): Payer: Medicare HMO

## 2019-02-24 DIAGNOSIS — Z9889 Other specified postprocedural states: Secondary | ICD-10-CM

## 2019-02-24 DIAGNOSIS — Z952 Presence of prosthetic heart valve: Secondary | ICD-10-CM | POA: Diagnosis not present

## 2019-02-24 NOTE — Progress Notes (Signed)
Reviewed home exercise guidelines with patient including endpoints, temperature precautions, target heart rate and rate of perceived exertion. Pt states he does a 3-4 mile walk/run daily as his mode of home exercise. Pt voices understanding of instructions given.  Sol Passer, MS, ACSM CEP

## 2019-02-27 ENCOUNTER — Other Ambulatory Visit: Payer: Self-pay

## 2019-02-27 ENCOUNTER — Encounter (HOSPITAL_COMMUNITY): Payer: Medicare HMO

## 2019-02-27 ENCOUNTER — Encounter (HOSPITAL_COMMUNITY)
Admission: RE | Admit: 2019-02-27 | Discharge: 2019-02-27 | Disposition: A | Discharge status: Home / Self Care | Payer: Medicare HMO | Source: Ambulatory Visit | Attending: Cardiovascular Disease | Admitting: Cardiovascular Disease

## 2019-02-27 DIAGNOSIS — Z9889 Other specified postprocedural states: Secondary | ICD-10-CM

## 2019-02-27 DIAGNOSIS — Z952 Presence of prosthetic heart valve: Secondary | ICD-10-CM | POA: Diagnosis not present

## 2019-02-28 DIAGNOSIS — H6123 Impacted cerumen, bilateral: Secondary | ICD-10-CM | POA: Diagnosis not present

## 2019-02-28 DIAGNOSIS — H903 Sensorineural hearing loss, bilateral: Secondary | ICD-10-CM | POA: Diagnosis not present

## 2019-03-01 ENCOUNTER — Other Ambulatory Visit: Payer: Self-pay

## 2019-03-01 ENCOUNTER — Encounter (HOSPITAL_COMMUNITY): Payer: Medicare HMO

## 2019-03-01 ENCOUNTER — Encounter (HOSPITAL_COMMUNITY)
Admission: RE | Admit: 2019-03-01 | Discharge: 2019-03-01 | Disposition: A | Discharge status: Home / Self Care | Payer: Medicare HMO | Source: Ambulatory Visit | Attending: Cardiovascular Disease | Admitting: Cardiovascular Disease

## 2019-03-01 DIAGNOSIS — Z9889 Other specified postprocedural states: Secondary | ICD-10-CM

## 2019-03-01 DIAGNOSIS — Z952 Presence of prosthetic heart valve: Secondary | ICD-10-CM | POA: Diagnosis not present

## 2019-03-02 NOTE — Progress Notes (Signed)
Cardiac Individual Treatment Plan  Patient Details  Name: Joshua Hess MRN: [id number] Date of Birth: 1941/05/29 Referring Provider:     CARDIAC REHAB PHASE II ORIENTATION from 01/24/2019 in [facility]  Referring Provider  Lauree Chandler, MD      Initial Encounter Date:    CARDIAC REHAB PHASE II ORIENTATION from 01/24/2019 in [facility]  Date  01/24/19      Visit Diagnosis: S/P aortic valve replacement 11/10/18  Patient's Home Medications on Admission:  Current Outpatient Medications:  .  aspirin EC 81 MG EC tablet, Take 1 tablet (81 mg total) by mouth daily., Disp: , Rfl:  .  traZODone (DESYREL) 50 MG tablet, Take 50 mg by mouth at bedtime as needed (sleep). , Disp: , Rfl:   Past Medical History: Past Medical History:  Diagnosis Date  . BPH (benign prostatic hyperplasia)   . 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 Carotid artery stenosis   . Chronotropic incompetence   . ED (erectile dysfunction)   . Elevated PSA   . HOH (hard of hearing)   . Mild CAD   . Postoperative atrial fibrillation ([facility])   . Severe aortic stenosis    a. s/p tissue AVR 10/2018.  Marland Kitchen Sinus bradycardia    baseline  . Wears glasses     Tobacco Use: Social History   Tobacco Use  Smoking Status Former Smoker  . Years: 1.00  . Quit date: 92  . Years since quitting: 50.6  Smokeless Tobacco Never Used    Labs: Recent Chemical engineer    Labs for ITP Cardiac and Pulmonary Rehab Latest Ref Rng & Units 11/08/2018 11/10/2018 11/10/2018 11/10/2018 11/10/2018   Hemoglobin A1c 4.8 - 5.6 % - - - - -   PHART 7.350 - 7.450 7.380 7.390 7.404 7.510(H) 7.336(L)   PCO2ART 32.0 - 48.0 mmHg 54.4(H) 44.1 46.2 30.4(L) 46.7   HCO3 20.0 - 28.0 mmol/L 32.2(H) 26.6 28.7(H) 24.2 25.0   TCO2 22 - 32 mmol/L 34(H) _0 ACIDBASEDEF 0.0 - 2.0 mmol/L - - - - 1.0   O2SAT % 100.0 98.0 99.0 99.0 99.0      Capillary Blood Glucose: Lab  Results  Component Value Date   GLUCAP 116 (H) 11/11/2018   GLUCAP 113 (H) 11/11/2018   GLUCAP 121 (H) 11/11/2018   GLUCAP 130 (H) 11/10/2018   GLUCAP 121 (H) 11/10/2018     Exercise Target Goals: Exercise Program Goal: Individual exercise prescription set using results from initial 6 min walk test and THRR while considering  patient's activity barriers and safety.   Exercise Prescription Goal: Initial exercise prescription builds to 30-45 minutes a day of aerobic activity, 2-3 days per week.  Home exercise guidelines will be given to patient during program as part of exercise prescription that the participant will acknowledge.  Activity Barriers & Risk Stratification: Activity Barriers & Cardiac Risk Stratification - 01/24/19 1529      Activity Barriers & Cardiac Risk Stratification   Activity Barriers  None;Other (comment)    Comments  Minor hip pain    Cardiac Risk Stratification  Moderate       6 Minute Walk: 6 Minute Walk    Row Name 01/24/19 1510         6 Minute Walk   Phase  Initial     Distance  1972 feet     Walk Time  6 minutes     #  of Rest Breaks  0     MPH  3.73     METS  3.74     RPE  9     Perceived Dyspnea   0     VO2 Peak  13.08     Symptoms  No     Resting HR  58 bpm     Resting BP  130/80     Resting Oxygen Saturation   97 %     Exercise Oxygen Saturation  during 6 min walk  97 %     Max Ex. HR  97 bpm     Max Ex. BP  140/78     2 Minute Post BP  120/82        Oxygen Initial Assessment:   Oxygen Re-Evaluation:   Oxygen Discharge (Final Oxygen Re-Evaluation):   Initial Exercise Prescription: Initial Exercise Prescription - 01/24/19 1500      Date of Initial Exercise RX and Referring Provider   Date  01/24/19    Referring Provider  Lauree Chandler, MD    Expected Discharge Date  03/13/19      Treadmill   MPH  3    Grade  0    Minutes  15    METs  3.3      NuStep   Level  3    SPM  85    Minutes  15    METs  3       Prescription Details   Frequency (times per week)  3    Duration  Progress to 30 minutes of continuous aerobic without signs/symptoms of physical distress      Intensity   THRR 40-80% of Max Heartrate  57-114    Ratings of Perceived Exertion  11-13    Perceived Dyspnea  0-4      Progression   Progression  Continue to progress workloads to maintain intensity without signs/symptoms of physical distress.      Resistance Training   Training Prescription  Yes    Weight  3lbs    Reps  10-15       Perform Capillary Blood Glucose checks as needed.  Exercise Prescription Changes: Exercise Prescription Changes    Row Name 01/30/19 1600 02/24/19 1525           Response to Exercise   Blood Pressure (Admit)  118/78  138/74      Blood Pressure (Exercise)  130/78  138/72      Blood Pressure (Exit)  124/72  138/80      Heart Rate (Admit)  69 bpm  77 bpm      Heart Rate (Exercise)  102 bpm  99 bpm      Heart Rate (Exit)  71 bpm  79 bpm      Rating of Perceived Exertion (Exercise)  13  11      Symptoms  None  None      Comments  Pt first day of exercise.   -      Duration  Continue with 30 min of aerobic exercise without signs/symptoms of physical distress.  Continue with 30 min of aerobic exercise without signs/symptoms of physical distress.      Intensity  THRR unchanged  THRR unchanged        Progression   Progression  Continue to progress workloads to maintain intensity without signs/symptoms of physical distress.  Continue to progress workloads to maintain intensity without signs/symptoms of physical distress.  Average METs  2.7  2.7        Resistance Training   Training Prescription  Yes  Yes      Weight  3lbs  4lbs      Reps  10-15  10-15      Time  10 Minutes  10 Minutes        Interval Training   Interval Training  -  No        Treadmill   MPH  2.8  2.8      Grade  0  1      Minutes  15  15      METs  3.14  3.53        NuStep   Level  3  4      SPM  85  85       Minutes  15  15      METs  2.4  1.9        Home Exercise Plan   Plans to continue exercise at  -  Home (comment) Walking      Frequency  -  Add 4 additional days to program exercise sessions.      Initial Home Exercises Provided  -  02/24/19         Exercise Comments: Exercise Comments    Row Name 01/30/19 1623 02/24/19 1523         Exercise Comments  Pt first day of exercise. Tolerated exercise well.  Reviewed home exercise gudielines and goals with patient.         Exercise Goals and Review: Exercise Goals    Row Name 01/24/19 1530             Exercise Goals   Increase Physical Activity  Yes       Intervention  Provide advice, education, support and counseling about physical activity/exercise needs.;Develop an individualized exercise prescription for aerobic and resistive training based on initial evaluation findings, risk stratification, comorbidities and participant's personal goals.       Expected Outcomes  Short Term: Attend rehab on a regular basis to increase amount of physical activity.;Long Term: Exercising regularly at least 3-5 days a week.;Long Term: Add in home exercise to make exercise part of routine and to increase amount of physical activity.       Increase Strength and Stamina  Yes       Intervention  Provide advice, education, support and counseling about physical activity/exercise needs.;Develop an individualized exercise prescription for aerobic and resistive training based on initial evaluation findings, risk stratification, comorbidities and participant's personal goals.       Expected Outcomes  Short Term: Increase workloads from initial exercise prescription for resistance, speed, and METs.;Short Term: Perform resistance training exercises routinely during rehab and add in resistance training at home;Long Term: Improve cardiorespiratory fitness, muscular endurance and strength as measured by increased METs and functional capacity (6MWT)       Able to  understand and use rate of perceived exertion (RPE) scale  Yes       Intervention  Provide education and explanation on how to use RPE scale       Expected Outcomes  Short Term: Able to use RPE daily in rehab to express subjective intensity level;Long Term:  Able to use RPE to guide intensity level when exercising independently       Knowledge and understanding of Target Heart Rate Range (THRR)  Yes       Intervention  Provide education and explanation of THRR including how the numbers were predicted and where they are located for reference       Expected Outcomes  Short Term: Able to state/look up THRR;Long Term: Able to use THRR to govern intensity when exercising independently;Short Term: Able to use daily as guideline for intensity in rehab       Able to check pulse independently  Yes       Intervention  Provide education and demonstration on how to check pulse in carotid and radial arteries.;Review the importance of being able to check your own pulse for safety during independent exercise       Expected Outcomes  Short Term: Able to explain why pulse checking is important during independent exercise;Long Term: Able to check pulse independently and accurately       Understanding of Exercise Prescription  Yes       Intervention  Provide education, explanation, and written materials on patient's individual exercise prescription       Expected Outcomes  Short Term: Able to explain program exercise prescription;Long Term: Able to explain home exercise prescription to exercise independently          Exercise Goals Re-Evaluation : Exercise Goals Re-Evaluation    Row Name 01/30/19 1622 02/24/19 1523           Exercise Goal Re-Evaluation   Exercise Goals Review  Increase Physical Activity;Increase Strength and Stamina;Able to understand and use rate of perceived exertion (RPE) scale;Knowledge and understanding of Target Heart Rate Range (THRR);Understanding of Exercise Prescription  Increase  Physical Activity;Increase Strength and Stamina;Able to understand and use rate of perceived exertion (RPE) scale;Knowledge and understanding of Target Heart Rate Range (THRR);Understanding of Exercise Prescription      Comments  Pt first day of exercise in CR program. Pt tolerated exercise prescription well.  Reviewed home exercise guidelines with patient including THRR, RPE scale, and endpoints for exercise. Pt stated that he does a 3-4 mile walk/run, ~45 minutes, daily. Pt given handout on pulse coutning.      Expected Outcomes  Will continue to monitor and progress Pt as tolerated.  Patient will continue daily exercise to help achieve personal health and fitness goals.         Discharge Exercise Prescription (Final Exercise Prescription Changes): Exercise Prescription Changes - 02/24/19 1525      Response to Exercise   Blood Pressure (Admit)  138/74    Blood Pressure (Exercise)  138/72    Blood Pressure (Exit)  138/80    Heart Rate (Admit)  77 bpm    Heart Rate (Exercise)  99 bpm    Heart Rate (Exit)  79 bpm    Rating of Perceived Exertion (Exercise)  11    Symptoms  None    Duration  Continue with 30 min of aerobic exercise without signs/symptoms of physical distress.    Intensity  THRR unchanged      Progression   Progression  Continue to progress workloads to maintain intensity without signs/symptoms of physical distress.    Average METs  2.7      Resistance Training   Training Prescription  Yes    Weight  4lbs    Reps  10-15    Time  10 Minutes      Interval Training   Interval Training  No      Treadmill   MPH  2.8    Grade  1    Minutes  15    METs  3.53      NuStep   Level  4    SPM  85    Minutes  15    METs  1.9      Home Exercise Plan   Plans to continue exercise at  Home (comment)   Walking   Frequency  Add 4 additional days to program exercise sessions.    Initial Home Exercises Provided  02/24/19       Nutrition:  Target Goals: Understanding of  nutrition guidelines, daily intake of sodium <1570m, cholesterol <2028m calories 30% from fat and 7% or less from saturated fats, daily to have 5 or more servings of fruits and vegetables.  Biometrics: Pre Biometrics - 01/24/19 1458      Pre Biometrics   Height  5' 7.75" (1.721 m)    Weight  78.8 kg    Waist Circumference  36 inches    Hip Circumference  40 inches    Waist to Hip Ratio  0.9 %    BMI (Calculated)  26.61    Triceps Skinfold  25 mm    % Body Fat  27.7 %    Grip Strength  36 kg    Flexibility  13 in    Single Leg Stand  27.31 seconds        Nutrition Therapy Plan and Nutrition Goals:   Nutrition Assessments:   Nutrition Goals Re-Evaluation:   Nutrition Goals Re-Evaluation:   Nutrition Goals Discharge (Final Nutrition Goals Re-Evaluation):   Psychosocial: Target Goals: Acknowledge presence or absence of significant depression and/or stress, maximize coping skills, provide positive support system. Participant is able to verbalize types and ability to use techniques and skills needed for reducing stress and depression.  Initial Review & Psychosocial Screening: Initial Psych Review & Screening - 01/24/19 1648      Initial Review   Current issues with  None Identified      Family Dynamics   Good Support System?  Yes   Joshua Hess his partner Joshua Hess support     Barriers   Psychosocial barriers to participate in program  There are no identifiable barriers or psychosocial needs.      Screening Interventions   Interventions  Encouraged to exercise       Quality of Life Scores: Quality of Life - 01/24/19 1545      Quality of Life   Select  Quality of Life      Quality of Life Scores   Health/Function Pre  24.87 %    Socioeconomic Pre  25.57 %    Psych/Spiritual Pre  24.75 %    Family Pre  27.88 %    GLOBAL Pre  25.38 %      Scores of 19 and below usually indicate a poorer quality of life in these areas.  A difference of  2-3 points is a  clinically meaningful difference.  A difference of 2-3 points in the total score of the Quality of Life Index has been associated with significant improvement in overall quality of life, self-image, physical symptoms, and general health in studies assessing change in quality of life.  PHQ-9: Recent Review Flowsheet Data    [facility]   Decreased Interest 0   Down, Depressed, Hopeless 0   PHQ - 2 Score 0     Interpretation of Total Score  Total Score Depression Severity:  1-4 = Minimal depression, 5-9 = Mild depression, 10-14 = Moderate depression, 15-19 = Moderately severe depression, 20-27 =  Severe depression   Psychosocial Evaluation and Intervention: Psychosocial Evaluation - 01/30/19 1623      Psychosocial Evaluation & Interventions   Interventions  Encouraged to exercise with the program and follow exercise prescription    Comments  No psychosocial needs identified.  Joshua Hess enjoys Haematologist and running.    Expected Outcomes  Joshua Hess will maintain a positive outlook with good coping skills.    Continue Psychosocial Services   No Follow up required       Psychosocial Re-Evaluation: Psychosocial Re-Evaluation    [place] Name 03/02/19 [phone]             Psychosocial Re-Evaluation   Current issues with  Current Anxiety/Panic       Comments  Joshua Hess endorses some anxiety in the afternoon but cannot pin point the source.  He has met with his physician and a psychologist.  He did not find working with the psychologist helpful.  Joshua Hess finds that walking and getting some fresh air is helpful to calm his anxiety.       Expected Outcomes  Joshua Hess will continue to work with his physician to develop a plan with his anxiety.       Interventions  Stress management education;Encouraged to attend Cardiac Rehabilitation for the exercise;Relaxation education;Physician referral       Continue Psychosocial Services   Follow up required by counselor          Psychosocial Discharge  (Final Psychosocial Re-Evaluation): Psychosocial Re-Evaluation - 03/02/19 4709      Psychosocial Re-Evaluation   Current issues with  Current Anxiety/Panic    Comments  Sayf endorses some anxiety in the afternoon but cannot pin point the source.  He has met with his physician and a psychologist.  He did not find working with the psychologist helpful.  Joshua Hess finds that walking and getting some fresh air is helpful to calm his anxiety.    Expected Outcomes  Joshua Hess will continue to work with his physician to develop a plan with his anxiety.    Interventions  Stress management education;Encouraged to attend Cardiac Rehabilitation for the exercise;Relaxation education;Physician referral    Continue Psychosocial Services   Follow up required by counselor       Vocational Rehabilitation: Provide vocational rehab assistance to qualifying candidates.   Vocational Rehab Evaluation & Intervention: Vocational Rehab - 01/24/19 1649      Initial Vocational Rehab Evaluation & Intervention   Assessment shows need for Vocational Rehabilitation  No       Education: Education Goals: Education classes will be provided on a weekly basis, covering required topics. Participant will state understanding/return demonstration of topics presented.  Learning Barriers/Preferences: Learning Barriers/Preferences - 01/24/19 1550      Learning Barriers/Preferences   Learning Barriers  Hearing    Learning Preferences  Written Material       Education Topics: Count Your Pulse:  -Group instruction provided by verbal instruction, demonstration, patient participation and written materials to support subject.  Instructors address importance of being able to find your pulse and how to count your pulse when at home without a heart monitor.  Patients get hands on experience counting their pulse with staff help and individually.   Heart Attack, Angina, and Risk Factor Modification:  -Group instruction provided by verbal  instruction, video, and written materials to support subject.  Instructors address signs and symptoms of angina and heart attacks.    Also discuss risk factors for heart disease and how to make changes to improve heart health  risk factors.   Functional Fitness:  -Group instruction provided by verbal instruction, demonstration, patient participation, and written materials to support subject.  Instructors address safety measures for doing things around the house.  Discuss how to get up and down off the floor, how to pick things up properly, how to safely get out of a chair without assistance, and balance training.   Meditation and Mindfulness:  -Group instruction provided by verbal instruction, patient participation, and written materials to support subject.  Instructor addresses importance of mindfulness and meditation practice to help reduce stress and improve awareness.  Instructor also leads participants through a meditation exercise.    Stretching for Flexibility and Mobility:  -Group instruction provided by verbal instruction, patient participation, and written materials to support subject.  Instructors lead participants through series of stretches that are designed to increase flexibility thus improving mobility.  These stretches are additional exercise for major muscle groups that are typically performed during regular warm up and cool down.   Hands Only CPR:  -Group verbal, video, and participation provides a basic overview of AHA guidelines for community CPR. Role-play of emergencies allow participants the opportunity to practice calling for help and chest compression technique with discussion of AED use.   Hypertension: -Group verbal and written instruction that provides a basic overview of hypertension including the most recent diagnostic guidelines, risk factor reduction with self-care instructions and medication management.    Nutrition I class: Heart Healthy Eating:  -Group  instruction provided by PowerPoint slides, verbal discussion, and written materials to support subject matter. The instructor gives an explanation and review of the Therapeutic Lifestyle Changes diet recommendations, which includes a discussion on lipid goals, dietary fat, sodium, fiber, plant stanol/sterol esters, sugar, and the components of a well-balanced, healthy diet.   Nutrition II class: Lifestyle Skills:  -Group instruction provided by PowerPoint slides, verbal discussion, and written materials to support subject matter. The instructor gives an explanation and review of label reading, grocery shopping for heart health, heart healthy recipe modifications, and ways to make healthier choices when eating out.   Diabetes Question & Answer:  -Group instruction provided by PowerPoint slides, verbal discussion, and written materials to support subject matter. The instructor gives an explanation and review of diabetes co-morbidities, pre- and post-prandial blood glucose goals, pre-exercise blood glucose goals, signs, symptoms, and treatment of hypoglycemia and hyperglycemia, and foot care basics.   Diabetes Blitz:  -Group instruction provided by PowerPoint slides, verbal discussion, and written materials to support subject matter. The instructor gives an explanation and review of the physiology behind type 1 and type 2 diabetes, diabetes medications and rational behind using different medications, pre- and post-prandial blood glucose recommendations and Hemoglobin A1c goals, diabetes diet, and exercise including blood glucose guidelines for exercising safely.    Portion Distortion:  -Group instruction provided by PowerPoint slides, verbal discussion, written materials, and food models to support subject matter. The instructor gives an explanation of serving size versus portion size, changes in portions sizes over the last 20 years, and what consists of a serving from each food group.   Stress  Management:  -Group instruction provided by verbal instruction, video, and written materials to support subject matter.  Instructors review role of stress in heart disease and how to cope with stress positively.     Exercising on Your Own:  -Group instruction provided by verbal instruction, power point, and written materials to support subject.  Instructors discuss benefits of exercise, components of exercise, frequency and intensity of  exercise, and end points for exercise.  Also discuss use of nitroglycerin and activating EMS.  Review options of places to exercise outside of rehab.  Review guidelines for sex with heart disease.   Cardiac Drugs I:  -Group instruction provided by verbal instruction and written materials to support subject.  Instructor reviews cardiac drug classes: antiplatelets, anticoagulants, beta blockers, and statins.  Instructor discusses reasons, side effects, and lifestyle considerations for each drug class.   Cardiac Drugs II:  -Group instruction provided by verbal instruction and written materials to support subject.  Instructor reviews cardiac drug classes: angiotensin converting enzyme inhibitors (ACE-I), angiotensin II receptor blockers (ARBs), nitrates, and calcium channel blockers.  Instructor discusses reasons, side effects, and lifestyle considerations for each drug class.   Anatomy and Physiology of the Circulatory System:  Group verbal and written instruction and models provide basic cardiac anatomy and physiology, with the coronary electrical and arterial systems. Review of: AMI, Angina, Valve disease, Heart Failure, Peripheral Artery Disease, Cardiac Arrhythmia, Pacemakers, and the ICD.   Other Education:  -Group or individual verbal, written, or video instructions that support the educational goals of the cardiac rehab program.   Holiday Eating Survival Tips:  -Group instruction provided by PowerPoint slides, verbal discussion, and written materials to  support subject matter. The instructor gives patients tips, tricks, and techniques to help them not only survive but enjoy the holidays despite the onslaught of food that accompanies the holidays.   Knowledge Questionnaire Score: Knowledge Questionnaire Score - 01/24/19 1545      Knowledge Questionnaire Score   Pre Score  17/24       Core Components/Risk Factors/Patient Goals at Admission: Personal Goals and Risk Factors at Admission - 01/24/19 1546      Core Components/Risk Factors/Patient Goals on Admission   Stress  Yes    Intervention  Offer individual and/or small group education and counseling on adjustment to heart disease, stress management and health-related lifestyle change. Teach and support self-help strategies.;Refer participants experiencing significant psychosocial distress to appropriate mental health specialists for further evaluation and treatment. When possible, include family members and significant others in education/counseling sessions.    Expected Outcomes  Short Term: Participant demonstrates changes in health-related behavior, relaxation and other stress management skills, ability to obtain effective social support, and compliance with psychotropic medications if prescribed.;Long Term: Emotional wellbeing is indicated by absence of clinically significant psychosocial distress or social isolation.    Personal Goal Other  Yes    Personal Goal  Increase running mileage. Be able to run a marathon.    Intervention  Develop an individualized exercise routine that patient can participate in at home to help achieve increased stamina and endurance, so that patient will be able increase running distance.    Expected Outcomes  Patient will be able to increase running distance and endurance.       Core Components/Risk Factors/Patient Goals Review:  Goals and Risk Factor Review    Row Name 01/30/19 1630 03/02/19 0826           Core Components/Risk Factors/Patient Goals  Review   Personal Goals Review  Stress;Other  Stress;Other      Review  Pt with few multiple CAD RFs.  Kohl would like to increase the amount of miles when he runs.  Pt with few multiple CAD RFs.  Ethen continues to tolerate exercise well. VSS.      Expected Outcomes  Pt will continue to participate in CR exercise, nutrition, and lifestyle modification opportunities.  Pt will continue to participate in CR exercise, nutrition, and lifestyle modification opportunities.         Core Components/Risk Factors/Patient Goals at Discharge (Final Review):  Goals and Risk Factor Review - 03/02/19 0826      Core Components/Risk Factors/Patient Goals Review   Personal Goals Review  Stress;Other    Review  Pt with few multiple CAD RFs.  Kimarion continues to tolerate exercise well. VSS.    Expected Outcomes  Pt will continue to participate in CR exercise, nutrition, and lifestyle modification opportunities.       ITP Comments: ITP Comments    Row Name 01/24/19 1445 01/30/19 1621 03/02/19 9688       ITP Comments  Dr. Fransico Him,  Medical Director  30 Day ITP Review. Pt started exercise today and tolerated it well.  30 Day ITP Review.  Lynard continues to tolerate exercise well.  VSS.  He has reported some anxiety that he cannot pin point the source.        Comments: See ITP Comments.

## 2019-03-03 ENCOUNTER — Other Ambulatory Visit: Payer: Self-pay

## 2019-03-03 ENCOUNTER — Encounter (HOSPITAL_COMMUNITY)
Admission: RE | Admit: 2019-03-03 | Discharge: 2019-03-03 | Disposition: A | Discharge status: Home / Self Care | Payer: Medicare HMO | Source: Ambulatory Visit | Attending: Cardiovascular Disease | Admitting: Cardiovascular Disease

## 2019-03-03 ENCOUNTER — Encounter (HOSPITAL_COMMUNITY): Payer: Medicare HMO

## 2019-03-03 DIAGNOSIS — Z9889 Other specified postprocedural states: Secondary | ICD-10-CM

## 2019-03-03 DIAGNOSIS — Z952 Presence of prosthetic heart valve: Secondary | ICD-10-CM | POA: Diagnosis not present

## 2019-03-06 ENCOUNTER — Encounter (HOSPITAL_COMMUNITY): Payer: Medicare HMO

## 2019-03-06 ENCOUNTER — Encounter (HOSPITAL_COMMUNITY)
Admission: RE | Admit: 2019-03-06 | Discharge: 2019-03-06 | Disposition: A | Discharge status: Home / Self Care | Payer: Medicare HMO | Source: Ambulatory Visit | Attending: Cardiovascular Disease | Admitting: Cardiovascular Disease

## 2019-03-06 ENCOUNTER — Other Ambulatory Visit: Payer: Self-pay

## 2019-03-06 DIAGNOSIS — Z9889 Other specified postprocedural states: Secondary | ICD-10-CM

## 2019-03-06 DIAGNOSIS — Z952 Presence of prosthetic heart valve: Secondary | ICD-10-CM | POA: Diagnosis not present

## 2019-03-08 ENCOUNTER — Encounter (HOSPITAL_COMMUNITY)
Admission: RE | Admit: 2019-03-08 | Discharge: 2019-03-08 | Disposition: A | Discharge status: Home / Self Care | Payer: Medicare HMO | Source: Ambulatory Visit | Attending: Cardiovascular Disease | Admitting: Cardiovascular Disease

## 2019-03-08 ENCOUNTER — Encounter (HOSPITAL_COMMUNITY): Payer: Medicare HMO

## 2019-03-08 ENCOUNTER — Other Ambulatory Visit: Payer: Self-pay

## 2019-03-08 DIAGNOSIS — Z952 Presence of prosthetic heart valve: Secondary | ICD-10-CM | POA: Diagnosis not present

## 2019-03-08 DIAGNOSIS — Z9889 Other specified postprocedural states: Secondary | ICD-10-CM

## 2019-03-10 ENCOUNTER — Other Ambulatory Visit: Payer: Self-pay

## 2019-03-10 ENCOUNTER — Encounter (HOSPITAL_COMMUNITY): Payer: Medicare HMO

## 2019-03-10 ENCOUNTER — Encounter (HOSPITAL_COMMUNITY)
Admission: RE | Admit: 2019-03-10 | Discharge: 2019-03-10 | Disposition: A | Discharge status: Home / Self Care | Payer: Medicare HMO | Source: Ambulatory Visit | Attending: Cardiovascular Disease | Admitting: Cardiovascular Disease

## 2019-03-10 VITALS — BP 122/68 | HR 78 | Temp 98.2°F | Ht 67.75 in | Wt 173.7 lb

## 2019-03-10 DIAGNOSIS — Z9889 Other specified postprocedural states: Secondary | ICD-10-CM

## 2019-03-10 DIAGNOSIS — Z952 Presence of prosthetic heart valve: Secondary | ICD-10-CM | POA: Diagnosis not present

## 2019-03-13 ENCOUNTER — Encounter (HOSPITAL_COMMUNITY): Payer: Medicare HMO

## 2019-03-14 DIAGNOSIS — C44622 Squamous cell carcinoma of skin of right upper limb, including shoulder: Secondary | ICD-10-CM | POA: Diagnosis not present

## 2019-03-14 DIAGNOSIS — Z85820 Personal history of malignant melanoma of skin: Secondary | ICD-10-CM | POA: Diagnosis not present

## 2019-03-14 DIAGNOSIS — Z85828 Personal history of other malignant neoplasm of skin: Secondary | ICD-10-CM | POA: Diagnosis not present

## 2019-03-14 DIAGNOSIS — C4442 Squamous cell carcinoma of skin of scalp and neck: Secondary | ICD-10-CM | POA: Diagnosis not present

## 2019-03-14 DIAGNOSIS — L570 Actinic keratosis: Secondary | ICD-10-CM | POA: Diagnosis not present

## 2019-03-14 DIAGNOSIS — L820 Inflamed seborrheic keratosis: Secondary | ICD-10-CM | POA: Diagnosis not present

## 2019-03-14 DIAGNOSIS — L821 Other seborrheic keratosis: Secondary | ICD-10-CM | POA: Diagnosis not present

## 2019-03-14 DIAGNOSIS — D045 Carcinoma in situ of skin of trunk: Secondary | ICD-10-CM | POA: Diagnosis not present

## 2019-03-14 DIAGNOSIS — C44729 Squamous cell carcinoma of skin of left lower limb, including hip: Secondary | ICD-10-CM | POA: Diagnosis not present

## 2019-03-14 DIAGNOSIS — D0472 Carcinoma in situ of skin of left lower limb, including hip: Secondary | ICD-10-CM | POA: Diagnosis not present

## 2019-03-14 DIAGNOSIS — D692 Other nonthrombocytopenic purpura: Secondary | ICD-10-CM | POA: Diagnosis not present

## 2019-03-15 ENCOUNTER — Encounter (HOSPITAL_COMMUNITY): Payer: Medicare HMO

## 2019-03-16 NOTE — Progress Notes (Signed)
Discharge Progress Report  Patient Details  Name: Joshua Hess MRN: [id number] Date of Birth: October 18, 1940 Referring Provider:     CARDIAC REHAB PHASE II ORIENTATION from 01/24/2019 in [place]  Referring Provider  Lauree Chandler, MD       Number of Visits: 18  Reason for Discharge:  Patient reached a stable level of exercise. Patient independent in their exercise. Patient has met program and personal goals.  Smoking History:  Social History   Tobacco Use  Smoking Status Former Smoker  . Years: 1.00  . Quit date: 44  . Years since quitting: 50.7  Smokeless Tobacco Never Used    Diagnosis:  S/P aortic valve replacement 11/10/18  ADL UCSD:   Initial Exercise Prescription: Initial Exercise Prescription - 01/24/19 1500      Date of Initial Exercise RX and Referring Provider   Date  01/24/19    Referring Provider  Lauree Chandler, MD    Expected Discharge Date  03/13/19      Treadmill   MPH  3    Grade  0    Minutes  15    METs  3.3      NuStep   Level  3    SPM  85    Minutes  15    METs  3      Prescription Details   Frequency (times per week)  3    Duration  Progress to 30 minutes of continuous aerobic without signs/symptoms of physical distress      Intensity   THRR 40-80% of Max Heartrate  57-114    Ratings of Perceived Exertion  11-13    Perceived Dyspnea  0-4      Progression   Progression  Continue to progress workloads to maintain intensity without signs/symptoms of physical distress.      Resistance Training   Training Prescription  Yes    Weight  3lbs    Reps  10-15       Discharge Exercise Prescription (Final Exercise Prescription Changes): Exercise Prescription Changes - 03/10/19 1501      Response to Exercise   Blood Pressure (Admit)  122/68    Blood Pressure (Exercise)  160/82    Blood Pressure (Exit)  138/82    Heart Rate (Admit)  78 bpm    Heart Rate (Exercise)  119 bpm    Heart  Rate (Exit)  67 bpm    Rating of Perceived Exertion (Exercise)  11    Symptoms  None    Duration  Continue with 30 min of aerobic exercise without signs/symptoms of physical distress.    Intensity  THRR unchanged      Progression   Progression  Continue to progress workloads to maintain intensity without signs/symptoms of physical distress.    Average METs  2.8      Resistance Training   Training Prescription  Yes    Weight  4lbs    Reps  10-15    Time  10 Minutes      Interval Training   Interval Training  No      Treadmill   MPH  2.8    Grade  1    Minutes  15    METs  3.53      NuStep   Level  4    SPM  85    Minutes  15    METs  2.1      Home Exercise Plan  Plans to continue exercise at  Home (comment)   Walking   Frequency  Add 4 additional days to program exercise sessions.    Initial Home Exercises Provided  02/24/19       Functional Capacity: 6 Minute Walk    Row Name 01/24/19 1510 03/06/19 1501       6 Minute Walk   Phase  Initial  Discharge    Distance  1972 feet  1790 feet    Distance % Change  -  9.23 %    Walk Time  6 minutes  6 minutes    # of Rest Breaks  0  0    MPH  3.73  3.39    METS  3.74  3.35    RPE  9  11    Perceived Dyspnea   0  0    VO2 Peak  13.08  11.73    Symptoms  No  No    Resting HR  58 bpm  65 bpm    Resting BP  130/80  124/60    Resting Oxygen Saturation   97 %  -    Exercise Oxygen Saturation  during 6 min walk  97 %  -    Max Ex. HR  97 bpm  96 bpm    Max Ex. BP  140/78  132/80    2 Minute Post BP  120/82  124/74       Psychological, QOL, Others - Outcomes: PHQ 2/9: Depression screen [facility] 2/9 03/10/2019 01/24/2019  Decreased Interest 0 0  Down, Depressed, Hopeless 0 0  PHQ - 2 Score 0 0    Quality of Life: Quality of Life - 03/15/19 1028      Quality of Life   Select  Quality of Life      Quality of Life Scores   Health/Function Pre  24.87 %    Health/Function Post  26.29 %    Health/Function % Change   5.71 %    Socioeconomic Pre  25.57 %    Socioeconomic Post  25.8 %    Socioeconomic % Change   0.9 %    Psych/Spiritual Pre  24.75 %    Psych/Spiritual Post  23.7 %    Psych/Spiritual % Change  -4.24 %    Family Pre  27.88 %    Family Post  24.75 %    Family % Change  -11.23 %    GLOBAL Pre  25.38 %    GLOBAL Post  25.52 %    GLOBAL % Change  0.55 %       Personal Goals: Goals established at orientation with interventions provided to work toward goal. Personal Goals and Risk Factors at Admission - 01/24/19 1546      Core Components/Risk Factors/Patient Goals on Admission   Stress  Yes    Intervention  Offer individual and/or small group education and counseling on adjustment to heart disease, stress management and health-related lifestyle change. Teach and support self-help strategies.;Refer participants experiencing significant psychosocial distress to appropriate mental health specialists for further evaluation and treatment. When possible, include family members and significant others in education/counseling sessions.    Expected Outcomes  Short Term: Participant demonstrates changes in health-related behavior, relaxation and other stress management skills, ability to obtain effective social support, and compliance with psychotropic medications if prescribed.;Long Term: Emotional wellbeing is indicated by absence of clinically significant psychosocial distress or social isolation.    Personal Goal Other  Yes  Personal Goal  Increase running mileage. Be able to run a marathon.    Intervention  Develop an individualized exercise routine that patient can participate in at home to help achieve increased stamina and endurance, so that patient will be able increase running distance.    Expected Outcomes  Patient will be able to increase running distance and endurance.        Personal Goals Discharge: Goals and Risk Factor Review    Row Name 01/30/19 1630 03/02/19 0826 03/10/19 1645          Core Components/Risk Factors/Patient Goals Review   Personal Goals Review  Stress;Other  Stress;Other  -     Review  Pt with few multiple CAD RFs.  Niklas would like to increase the amount of miles when he runs.  Pt with few multiple CAD RFs.  Rajeev continues to tolerate exercise well. VSS.  Kvion graduated CR today.  He tolerated exercise well and enjoyed establishing a routine.  Teion had perfect attendance in the program.     Expected Outcomes  Pt will continue to participate in CR exercise, nutrition, and lifestyle modification opportunities.  Pt will continue to participate in CR exercise, nutrition, and lifestyle modification opportunities.  Pt will continue to participate in exercise, nutrition, and lifestyle modification opportunities. He plans to walk/jog for exercise.  This also helps with his anxiety.        Exercise Goals and Review: Exercise Goals    Row Name 01/24/19 1530             Exercise Goals   Increase Physical Activity  Yes       Intervention  Provide advice, education, support and counseling about physical activity/exercise needs.;Develop an individualized exercise prescription for aerobic and resistive training based on initial evaluation findings, risk stratification, comorbidities and participant's personal goals.       Expected Outcomes  Short Term: Attend rehab on a regular basis to increase amount of physical activity.;Long Term: Exercising regularly at least 3-5 days a week.;Long Term: Add in home exercise to make exercise part of routine and to increase amount of physical activity.       Increase Strength and Stamina  Yes       Intervention  Provide advice, education, support and counseling about physical activity/exercise needs.;Develop an individualized exercise prescription for aerobic and resistive training based on initial evaluation findings, risk stratification, comorbidities and participant's personal goals.       Expected Outcomes  Short Term: Increase  workloads from initial exercise prescription for resistance, speed, and METs.;Short Term: Perform resistance training exercises routinely during rehab and add in resistance training at home;Long Term: Improve cardiorespiratory fitness, muscular endurance and strength as measured by increased METs and functional capacity (6MWT)       Able to understand and use rate of perceived exertion (RPE) scale  Yes       Intervention  Provide education and explanation on how to use RPE scale       Expected Outcomes  Short Term: Able to use RPE daily in rehab to express subjective intensity level;Long Term:  Able to use RPE to guide intensity level when exercising independently       Knowledge and understanding of Target Heart Rate Range (THRR)  Yes       Intervention  Provide education and explanation of THRR including how the numbers were predicted and where they are located for reference       Expected Outcomes  Short Term: Able  to state/look up THRR;Long Term: Able to use THRR to govern intensity when exercising independently;Short Term: Able to use daily as guideline for intensity in rehab       Able to check pulse independently  Yes       Intervention  Provide education and demonstration on how to check pulse in carotid and radial arteries.;Review the importance of being able to check your own pulse for safety during independent exercise       Expected Outcomes  Short Term: Able to explain why pulse checking is important during independent exercise;Long Term: Able to check pulse independently and accurately       Understanding of Exercise Prescription  Yes       Intervention  Provide education, explanation, and written materials on patient's individual exercise prescription       Expected Outcomes  Short Term: Able to explain program exercise prescription;Long Term: Able to explain home exercise prescription to exercise independently          Exercise Goals Re-Evaluation: Exercise Goals Re-Evaluation     Row Name 01/30/19 1622 02/24/19 1523 03/10/19 1542         Exercise Goal Re-Evaluation   Exercise Goals Review  Increase Physical Activity;Increase Strength and Stamina;Able to understand and use rate of perceived exertion (RPE) scale;Knowledge and understanding of Target Heart Rate Range (THRR);Understanding of Exercise Prescription  Increase Physical Activity;Increase Strength and Stamina;Able to understand and use rate of perceived exertion (RPE) scale;Knowledge and understanding of Target Heart Rate Range (THRR);Understanding of Exercise Prescription  Increase Physical Activity;Increase Strength and Stamina;Able to understand and use rate of perceived exertion (RPE) scale;Knowledge and understanding of Target Heart Rate Range (THRR);Understanding of Exercise Prescription     Comments  Pt first day of exercise in CR program. Pt tolerated exercise prescription well.  Reviewed home exercise guidelines with patient including THRR, RPE scale, and endpoints for exercise. Pt stated that he does a 3-4 mile walk/run, ~45 minutes, daily. Pt given handout on pulse coutning.  Patient completed the phase 2 cardiac rehab program and will continue walking daily as his mode of exercise.     Expected Outcomes  Will continue to monitor and progress Pt as tolerated.  Patient will continue daily exercise to help achieve personal health and fitness goals.  Patient will exercise by walking 45 minutes daily to help maintain strength and stamina.        Nutrition & Weight - Outcomes: Pre Biometrics - 01/24/19 1458      Pre Biometrics   Height  5' 7.75" (1.721 m)    Weight  78.8 kg    Waist Circumference  36 inches    Hip Circumference  40 inches    Waist to Hip Ratio  0.9 %    BMI (Calculated)  26.61    Triceps Skinfold  25 mm    % Body Fat  27.7 %    Grip Strength  36 kg    Flexibility  13 in    Single Leg Stand  27.31 seconds      Post Biometrics - 03/10/19 1501       Post  Biometrics   Height  5' 7.75"  (1.721 m)    Weight  78.8 kg    Waist Circumference  36.25 inches    Hip Circumference  40.75 inches    Waist to Hip Ratio  0.89 %    BMI (Calculated)  26.61    Triceps Skinfold  19 mm    %  Body Fat  26.7 %    Grip Strength  42 kg    Flexibility  17.75 in    Single Leg Stand  2.25 seconds       Nutrition:   Nutrition Discharge:   Education Questionnaire Score: Knowledge Questionnaire Score - 03/10/19 1447      Knowledge Questionnaire Score   Pre Score  17/24    Post Score  21/24       Goals reviewed with patient; copy given to patient.

## 2019-03-17 ENCOUNTER — Encounter (HOSPITAL_COMMUNITY): Payer: Medicare HMO

## 2019-03-22 ENCOUNTER — Encounter (HOSPITAL_COMMUNITY): Payer: Medicare HMO

## 2019-03-23 ENCOUNTER — Encounter: Payer: Self-pay | Admitting: Cardiovascular Disease

## 2019-03-23 ENCOUNTER — Other Ambulatory Visit: Payer: Self-pay

## 2019-03-23 ENCOUNTER — Ambulatory Visit (INDEPENDENT_AMBULATORY_CARE_PROVIDER_SITE_OTHER): Payer: Medicare HMO | Admitting: Cardiovascular Disease

## 2019-03-23 VITALS — BP 150/90 | HR 58 | Ht 67.0 in | Wt 174.0 lb

## 2019-03-23 DIAGNOSIS — I2510 Atherosclerotic heart disease of native coronary artery without angina pectoris: Secondary | ICD-10-CM

## 2019-03-23 DIAGNOSIS — I350 Nonrheumatic aortic (valve) stenosis: Secondary | ICD-10-CM | POA: Diagnosis not present

## 2019-03-23 NOTE — Patient Instructions (Signed)
Medication Instructions:  Your provider recommends that you continue on your current medications as directed. Please refer to the Current Medication list given to you today.    Labwork: None  Testing/Procedures: None  Follow-Up: Your provider wants you to follow-up in: 6 months with Dr. McAlhany. You will receive a reminder letter in the mail two months in advance. If you don't receive a letter, please call our office to schedule the follow-up appointment.    Any Other Special Instructions Will Be Listed Below (If Applicable).     If you need a refill on your cardiac medications before your next appointment, please call your pharmacy.   

## 2019-03-23 NOTE — Progress Notes (Signed)
Chief Complaint  Patient presents with  . Follow-up    aortic valve disease   History of Present Illness: 78 yo male with history of severe aortic stenosis s/p AVR, post op atrial fib, mild CAD, carotid artery disease who is here today for cardiac follow up. He recently underwent surgical AVR 11/10/18. He had been worked up for TAVR but a mobile mass was seen in the aortic sinus so the TAVR was cancelled. 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 He was started on ASA and Metoprolol post op. Due to a brief run of atrial fib post op, amiodarone was used but stopped due to nausea. He continue to struggle with nausea and fatigue post op so metoprolol was stopped. He had anxiety following his surgery and has been followed in primary care for this. Echo 01/26/19 with LVEF=50-55%. The bioprosthetic AVR is working well with mean gradient 16 mmHg.   He is here today for follow up. The patient denies any chest pain, dyspnea, palpitations, lower extremity edema, orthopnea, PND, dizziness, near syncope or syncope.   Primary Care Physician: [name], MD  Past Medical History:  Diagnosis Date  . BPH (benign prostatic hyperplasia)   . Cancer ([place])    behind ear- [place]  . Carotid artery stenosis   . Chronotropic incompetence   . ED (erectile dysfunction)   . Elevated PSA   . HOH (hard of hearing)   . Mild CAD   . Postoperative atrial fibrillation ([place])   . Severe aortic stenosis    a. s/p tissue AVR 10/2018.  Marland Kitchen Sinus bradycardia    baseline  . Wears glasses     Past Surgical History:  Procedure Laterality Date  . AORTIC VALVE REPLACEMENT N/A 11/10/2018   Procedure: AORTIC VALVE REPLACEMENT (AVR), USING 23MM INSPIRIS;  Surgeon: Gaye Pollack, MD;  Location: [facility];  Service: Open Heart Surgery;  Laterality: N/A;  . EXTERNAL EAR SURGERY     melanoma removed  . HERNIA REPAIR    . RIGHT/LEFT HEART CATH AND CORONARY ANGIOGRAPHY N/A  09/29/2018   Procedure: RIGHT/LEFT HEART CATH AND CORONARY ANGIOGRAPHY;  Surgeon: Burnell Blanks, MD;  Location: [facility];  Service: Cardiovascular;  Laterality: N/A;  . TEE WITHOUT CARDIOVERSION N/A 11/10/2018   Procedure: TRANSESOPHAGEAL ECHOCARDIOGRAM (TEE);  Surgeon: Gaye Pollack, MD;  Location: [facility];  Service: Open Heart Surgery;  Laterality: N/A;  . TRANSCATHETER AORTIC VALVE REPLACEMENT, TRANSFEMORAL     PROCEDURE ATTEMPTED & ABORTED     Current Outpatient Medications  Medication Sig Dispense Refill  . ALPRAZolam (XANAX) 0.5 MG tablet Take 0.5 mg by mouth at bedtime as needed for anxiety.    Marland Kitchen aspirin EC 81 MG EC tablet Take 1 tablet (81 mg total) by mouth daily.    . rosuvastatin (CRESTOR) 10 MG tablet Take 10 mg by mouth daily.    . traZODone (DESYREL) 50 MG tablet Take 50 mg by mouth at bedtime as needed (sleep).      No current facility-administered medications for this visit.     No Known Allergies  Social History   Socioeconomic History  . Marital status: Married    Spouse name: Not on file  . Number of children: 0  . Years of education: Not on file  . Highest education level: Not on file  Occupational History  . Occupation: Set designer  . Financial resource strain: Not on file  .  Food insecurity    Worry: Not on file    Inability: Not on file  . Transportation needs    Medical: Not on file    Non-medical: Not on file  Tobacco Use  . Smoking status: Former Smoker    Years: 1.00    Quit date: 1970    Years since quitting: 50.7  . Smokeless tobacco: Never Used  Substance and Sexual Activity  . Alcohol use: No  . Drug use: No  . Sexual activity: Not on file  Lifestyle  . Physical activity    Days per week: Not on file    Minutes per session: Not on file  . Stress: Not on file  Relationships  . Social Herbalist on phone: Not on file    Gets together: Not on file    Attends religious service:  Not on file    Active member of club or organization: Not on file    Attends meetings of clubs or organizations: Not on file    Relationship status: Not on file  . Intimate partner violence    Fear of current or ex partner: Not on file    Emotionally abused: Not on file    Physically abused: Not on file    Forced sexual activity: Not on file  Other Topics Concern  . Not on file  Social History Narrative  . Not on file    Family History  Problem Relation Age of Onset  . Heart disease Brother     Review of Systems:  As stated in the HPI and otherwise negative.   BP (!) 150/90   Pulse (!) 58   Ht 5\' 7"  (1.702 m)   Wt 174 lb (78.9 kg)   SpO2 98%   BMI 27.25 kg/m   Physical Examination: General: Well developed, well nourished, NAD  HEENT: OP clear, mucus membranes moist  SKIN: warm, dry. No rashes. Neuro: No focal deficits  Musculoskeletal: Muscle strength 5/5 all ext  Psychiatric: Mood and affect normal  Neck: No JVD, no carotid bruits, no thyromegaly, no lymphadenopathy.  Lungs:Clear bilaterally, no wheezes, rhonci, crackles Cardiovascular: Regular rate and rhythm. No murmurs, gallops or rubs. Abdomen:Soft. Bowel sounds present. Non-tender.  Extremities: No lower extremity edema. Pulses are 2 + in the bilateral DP/PT.  Echo July 2020:   1. The left ventricle has low normal systolic function, with an ejection fraction of 50-55%. The cavity size was normal. Left ventricular diastolic Doppler parameters are consistent with impaired relaxation.  2. LVEF is normal at 50 to 55% with anterior and lateral hypokinesis.  3. The right ventricle has normal systolic function.  4. AV prosthesis is difficult to see. Peak and mean gradients through the prosthesis are 33 and 16 mm Hg respectively.  EKG:  EKG is ordered today. The ekg ordered today demonstrates Sinus brady, raet 58 bpm. PACs.   Recent Labs: 11/04/2018: ALT 14; B Natriuretic Peptide 395.7 11/13/2018: Magnesium 1.9; TSH  1.132 11/14/2018: BUN 26; Creatinine, Ser 0.93; Hemoglobin 11.0; Platelets 134; Potassium 3.6; Sodium 141   Lipid Panel No results found for: CHOL, TRIG, HDL, CHOLHDL, VLDL, LDLCALC, LDLDIRECT   Wt Readings from Last 3 Encounters:  03/23/19 174 lb (78.9 kg)  03/10/19 173 lb 11.6 oz (78.8 kg)  01/24/19 173 lb 11.6 oz (78.8 kg)     Assessment and Plan:   1. Aortic stenosis: He is now s/p surgical AVR on 11/10/18 and doing well. The bioprosthetic AVR is working well  by echo July 2020. Continue ASA  2. CAD without angina: Mild CAD on cath pre AVR in 2020. He did not tolerate beta blockers due to fatigue. Continue ASA and statin  3. Post op atrial fibrillation: No recurrence. Continue ASA only   Current medicines are reviewed at length with the patient today.  The patient does not have concerns regarding medicines.  The following changes have been made:  no change  Labs/ tests ordered today include:   Orders Placed This Encounter  Procedures  . EKG 12-Lead     Disposition:   FU with me in 6 months.    Signed, Lauree Chandler, MD 03/23/2019 11:21 AM    [facility] [address], [place], [place]  [id number] Phone: [phone]; Fax: [phone]

## 2019-03-24 ENCOUNTER — Encounter (HOSPITAL_COMMUNITY): Payer: Medicare HMO

## 2019-03-27 ENCOUNTER — Encounter (HOSPITAL_COMMUNITY): Payer: Medicare HMO

## 2019-03-29 ENCOUNTER — Encounter (HOSPITAL_COMMUNITY): Payer: Medicare HMO

## 2019-03-31 ENCOUNTER — Other Ambulatory Visit: Payer: Self-pay | Admitting: Nurse Practitioner

## 2019-03-31 ENCOUNTER — Encounter (HOSPITAL_COMMUNITY): Payer: Medicare HMO

## 2019-03-31 MED ORDER — AMOXICILLIN 500 MG PO TABS: ORAL_TABLET | ORAL | 1 refills | Status: DC

## 2019-04-03 ENCOUNTER — Encounter (HOSPITAL_COMMUNITY): Payer: Medicare HMO

## 2019-04-03 DIAGNOSIS — I6523 Occlusion and stenosis of bilateral carotid arteries: Secondary | ICD-10-CM | POA: Diagnosis not present

## 2019-04-05 ENCOUNTER — Encounter (HOSPITAL_COMMUNITY): Payer: Medicare HMO

## 2019-04-05 DIAGNOSIS — Z23 Encounter for immunization: Secondary | ICD-10-CM | POA: Diagnosis not present

## 2019-04-05 DIAGNOSIS — F5104 Psychophysiologic insomnia: Secondary | ICD-10-CM | POA: Diagnosis not present

## 2019-04-05 DIAGNOSIS — R4589 Other symptoms and signs involving emotional state: Secondary | ICD-10-CM | POA: Diagnosis not present

## 2019-04-05 DIAGNOSIS — I6523 Occlusion and stenosis of bilateral carotid arteries: Secondary | ICD-10-CM | POA: Diagnosis not present

## 2019-04-07 ENCOUNTER — Encounter (HOSPITAL_COMMUNITY): Payer: Medicare HMO

## 2019-04-10 ENCOUNTER — Encounter (HOSPITAL_COMMUNITY): Payer: Medicare HMO

## 2019-04-12 ENCOUNTER — Encounter (HOSPITAL_COMMUNITY): Payer: Medicare HMO

## 2019-04-14 ENCOUNTER — Encounter (HOSPITAL_COMMUNITY): Payer: Medicare HMO

## 2019-04-17 ENCOUNTER — Encounter (HOSPITAL_COMMUNITY): Payer: Medicare HMO

## 2019-04-19 ENCOUNTER — Encounter (HOSPITAL_COMMUNITY): Payer: Medicare HMO

## 2019-04-26 DIAGNOSIS — C44629 Squamous cell carcinoma of skin of left upper limb, including shoulder: Secondary | ICD-10-CM | POA: Diagnosis not present

## 2019-04-26 DIAGNOSIS — D692 Other nonthrombocytopenic purpura: Secondary | ICD-10-CM | POA: Diagnosis not present

## 2019-04-26 DIAGNOSIS — Z85828 Personal history of other malignant neoplasm of skin: Secondary | ICD-10-CM | POA: Diagnosis not present

## 2019-04-26 DIAGNOSIS — L820 Inflamed seborrheic keratosis: Secondary | ICD-10-CM | POA: Diagnosis not present

## 2019-04-26 DIAGNOSIS — Z85820 Personal history of malignant melanoma of skin: Secondary | ICD-10-CM | POA: Diagnosis not present

## 2019-04-26 DIAGNOSIS — L821 Other seborrheic keratosis: Secondary | ICD-10-CM | POA: Diagnosis not present

## 2019-04-26 DIAGNOSIS — C44729 Squamous cell carcinoma of skin of left lower limb, including hip: Secondary | ICD-10-CM | POA: Diagnosis not present

## 2019-04-26 DIAGNOSIS — L570 Actinic keratosis: Secondary | ICD-10-CM | POA: Diagnosis not present

## 2019-04-26 DIAGNOSIS — C44622 Squamous cell carcinoma of skin of right upper limb, including shoulder: Secondary | ICD-10-CM | POA: Diagnosis not present

## 2019-04-26 DIAGNOSIS — D0462 Carcinoma in situ of skin of left upper limb, including shoulder: Secondary | ICD-10-CM | POA: Diagnosis not present

## 2019-05-10 ENCOUNTER — Telehealth: Payer: Self-pay | Admitting: *Deleted

## 2019-05-10 NOTE — Telephone Encounter (Signed)
Our received a cardiac clearance request that has no letter head, no surgeon listed. I left message for Joshua Hess patient care coordinator @ [phone]. I requested to please fax over a cardiac clearance form on letterhead as well to include anesthesia and name of the surgeon.

## 2019-05-16 ENCOUNTER — Other Ambulatory Visit: Payer: Self-pay | Admitting: Cardiology

## 2019-05-16 MED ORDER — AMOXICILLIN 500 MG PO TABS: ORAL_TABLET | ORAL | 1 refills | Status: DC

## 2019-05-16 NOTE — Telephone Encounter (Signed)
Dr. Angelena Form  Can you please make a comment regarding this patients ASA therapy? He is to undergo dental extractions as well as bone grafting. Will place order for SBE. Dental team asking for recommendations for holding ASA.   He has a history of severe aortic stenosis s/p AVR, post op atrial fib, mild CAD, carotid artery disease who is here today for cardiac follow up. He recently underwent surgical AVR 11/10/18.  Please route your comments to the pre-op pool   Thank you  Sharee Pimple

## 2019-05-16 NOTE — Telephone Encounter (Signed)
WILL ALSO HAVE BONE GRAFT

## 2019-05-16 NOTE — Telephone Encounter (Signed)
   [facility] Pre-operative Risk Assessment    Request for surgical clearance:  1. What type of surgery is being performed? DENTAL EXTRACTIONS and RESTORATIONS   2. When is this surgery scheduled? TBD   3. What type of clearance is required (medical clearance vs. Pharmacy clearance to hold med vs. Both)? MEDICAL  4. Are there any medications that need to be held prior to surgery and how long? ASA ; PER SURGEON PT WILL NEED TO HAVE SBE (2g AMOXICILLIN 1 HOUR PRIOR TO APPT)  5. Practice name and name of physician performing surgery? [facility]; DR. Legrand Como RAMIREZ   6. What is your office phone number [phone]    7.   What is your office fax number [phone]  8.   Anesthesia type (None, local, MAC, general) ? 2% LIDOCAINE, 4% SEPTOCAINE (ARTICAINE HCI) 1:100,000 EPINEPHRINE   Julaine Hua 05/16/2019, 10:26 AM  _________________________________________________________________   (provider comments below)

## 2019-05-16 NOTE — Telephone Encounter (Signed)
   Primary Cardiologist: Lauree Chandler, MD  Chart reviewed as part of pre-operative protocol coverage. Given past medical history and time since last visit, based on ACC/AHA guidelines, Joshua Hess would be at acceptable risk for the planned procedure without further cardiovascular testing.   Per Dr. Angelena Form, patient may hold ASA one week prior to procedure if needed and restart after safe per dental team. Per medication list, he has been prescribed 2g Amoxicillin PO to be taken 1-2 hours prior to procedure.   I will route this recommendation to the requesting party via Epic fax function and remove from pre-op pool.  Please call with questions.  Kathyrn Drown, NP 05/16/2019, 12:02 PM

## 2019-05-16 NOTE — Telephone Encounter (Signed)
If they think he has to hold ASA for his procedure then that should be ok to hold for one week prior to the dental surgery. Thanks, chris

## 2019-05-18 NOTE — Telephone Encounter (Signed)
Follow Up  Winfred Burn from [facility] is returning call. Please give him a call back at [phone]

## 2019-06-14 DIAGNOSIS — L821 Other seborrheic keratosis: Secondary | ICD-10-CM | POA: Diagnosis not present

## 2019-06-14 DIAGNOSIS — L814 Other melanin hyperpigmentation: Secondary | ICD-10-CM | POA: Diagnosis not present

## 2019-06-14 DIAGNOSIS — C44729 Squamous cell carcinoma of skin of left lower limb, including hip: Secondary | ICD-10-CM | POA: Diagnosis not present

## 2019-06-14 DIAGNOSIS — Z85820 Personal history of malignant melanoma of skin: Secondary | ICD-10-CM | POA: Diagnosis not present

## 2019-06-14 DIAGNOSIS — I788 Other diseases of capillaries: Secondary | ICD-10-CM | POA: Diagnosis not present

## 2019-06-14 DIAGNOSIS — L905 Scar conditions and fibrosis of skin: Secondary | ICD-10-CM | POA: Diagnosis not present

## 2019-06-14 DIAGNOSIS — L820 Inflamed seborrheic keratosis: Secondary | ICD-10-CM | POA: Diagnosis not present

## 2019-06-14 DIAGNOSIS — L570 Actinic keratosis: Secondary | ICD-10-CM | POA: Diagnosis not present

## 2019-06-14 DIAGNOSIS — D0461 Carcinoma in situ of skin of right upper limb, including shoulder: Secondary | ICD-10-CM | POA: Diagnosis not present

## 2019-06-14 DIAGNOSIS — D692 Other nonthrombocytopenic purpura: Secondary | ICD-10-CM | POA: Diagnosis not present

## 2019-06-14 DIAGNOSIS — Z85828 Personal history of other malignant neoplasm of skin: Secondary | ICD-10-CM | POA: Diagnosis not present

## 2019-07-05 DIAGNOSIS — H524 Presbyopia: Secondary | ICD-10-CM | POA: Diagnosis not present

## 2019-07-11 DIAGNOSIS — K4090 Unilateral inguinal hernia, without obstruction or gangrene, not specified as recurrent: Secondary | ICD-10-CM | POA: Diagnosis not present

## 2019-07-11 DIAGNOSIS — L910 Hypertrophic scar: Secondary | ICD-10-CM | POA: Diagnosis not present

## 2019-07-11 DIAGNOSIS — L578 Other skin changes due to chronic exposure to nonionizing radiation: Secondary | ICD-10-CM | POA: Diagnosis not present

## 2019-07-11 DIAGNOSIS — B351 Tinea unguium: Secondary | ICD-10-CM | POA: Diagnosis not present

## 2019-07-31 ENCOUNTER — Other Ambulatory Visit: Payer: Self-pay

## 2019-07-31 ENCOUNTER — Encounter (HOSPITAL_COMMUNITY): Payer: Self-pay | Admitting: Emergency Medicine

## 2019-07-31 ENCOUNTER — Emergency Department (HOSPITAL_COMMUNITY): Payer: Medicare HMO

## 2019-07-31 ENCOUNTER — Emergency Department (HOSPITAL_COMMUNITY)
Admission: EM | Admit: 2019-07-31 | Discharge: 2019-07-31 | Disposition: A | Discharge status: Home / Self Care | Payer: Medicare HMO | Attending: Emergency Medicine | Admitting: Emergency Medicine

## 2019-07-31 ENCOUNTER — Telehealth: Payer: Self-pay

## 2019-07-31 DIAGNOSIS — Z79899 Other long term (current) drug therapy: Secondary | ICD-10-CM | POA: Diagnosis not present

## 2019-07-31 DIAGNOSIS — R9431 Abnormal electrocardiogram [ECG] [EKG]: Secondary | ICD-10-CM | POA: Diagnosis not present

## 2019-07-31 DIAGNOSIS — Z87891 Personal history of nicotine dependence: Secondary | ICD-10-CM | POA: Insufficient documentation

## 2019-07-31 DIAGNOSIS — Z7982 Long term (current) use of aspirin: Secondary | ICD-10-CM | POA: Diagnosis not present

## 2019-07-31 DIAGNOSIS — I4891 Unspecified atrial fibrillation: Secondary | ICD-10-CM | POA: Insufficient documentation

## 2019-07-31 DIAGNOSIS — R4701 Aphasia: Secondary | ICD-10-CM | POA: Diagnosis not present

## 2019-07-31 DIAGNOSIS — I2510 Atherosclerotic heart disease of native coronary artery without angina pectoris: Secondary | ICD-10-CM | POA: Insufficient documentation

## 2019-07-31 LAB — COMPREHENSIVE METABOLIC PANEL
ALT: 23 U/L (ref 0–44)
AST: 23 U/L (ref 15–41)
Albumin: 3.8 g/dL (ref 3.5–5.0)
Alkaline Phosphatase: 66 U/L (ref 38–126)
Anion gap: 10 (ref 5–15)
BUN: 28 mg/dL — ABNORMAL HIGH (ref 8–23)
CO2: 24 mmol/L (ref 22–32)
Calcium: 9.2 mg/dL (ref 8.9–10.3)
Chloride: 105 mmol/L (ref 98–111)
Creatinine, Ser: 1.03 mg/dL (ref 0.61–1.24)
GFR calc Af Amer: >60 mL/min (ref >60)
GFR calc non Af Amer: >60 mL/min (ref >60)
Glucose, Bld: 109 mg/dL — ABNORMAL HIGH (ref 70–99)
Potassium: 4.5 mmol/L (ref 3.5–5.1)
Sodium: 139 mmol/L (ref 135–145)
Total Bilirubin: 0.9 mg/dL (ref 0.3–1.2)
Total Protein: 6.8 g/dL (ref 6.5–8.1)

## 2019-07-31 LAB — DIFFERENTIAL
Abs Immature Granulocytes: 0.01 10*3/uL (ref 0.00–0.07)
Basophils Absolute: 0 10*3/uL (ref 0.0–0.1)
Basophils Relative: 0 %
Eosinophils Absolute: 0.1 10*3/uL (ref 0.0–0.5)
Eosinophils Relative: 1 %
Immature Granulocytes: 0 %
Lymphocytes Relative: 30 %
Lymphs Abs: 1.9 10*3/uL (ref 0.7–4.0)
Monocytes Absolute: 0.6 10*3/uL (ref 0.1–1.0)
Monocytes Relative: 9 %
Neutro Abs: 3.8 10*3/uL (ref 1.7–7.7)
Neutrophils Relative %: 60 %

## 2019-07-31 LAB — I-STAT CHEM 8, ED
BUN: 29 mg/dL — ABNORMAL HIGH (ref 8–23)
Calcium, Ion: 1.13 mmol/L — ABNORMAL LOW (ref 1.15–1.40)
Chloride: 104 mmol/L (ref 98–111)
Creatinine, Ser: 1 mg/dL (ref 0.61–1.24)
Glucose, Bld: 105 mg/dL — ABNORMAL HIGH (ref 70–99)
HCT: 43 % (ref 39.0–52.0)
Hemoglobin: 14.6 g/dL (ref 13.0–17.0)
Potassium: 4.4 mmol/L (ref 3.5–5.1)
Sodium: 139 mmol/L (ref 135–145)
TCO2: 27 mmol/L (ref 22–32)

## 2019-07-31 LAB — PROTIME-INR
INR: 1 (ref 0.8–1.2)
Prothrombin Time: 13.5 seconds (ref 11.4–15.2)

## 2019-07-31 LAB — CBC
HCT: 44.3 % (ref 39.0–52.0)
Hemoglobin: 14.8 g/dL (ref 13.0–17.0)
MCH: 32.6 pg (ref 26.0–34.0)
MCHC: 33.4 g/dL (ref 30.0–36.0)
MCV: 97.6 fL (ref 80.0–100.0)
Platelets: 183 10*3/uL (ref 150–400)
RBC: 4.54 MIL/uL (ref 4.22–5.81)
RDW: 12 % (ref 11.5–15.5)
WBC: 6.5 10*3/uL (ref 4.0–10.5)
nRBC: 0 % (ref 0.0–0.2)

## 2019-07-31 LAB — APTT: aPTT: 30 seconds (ref 24–36)

## 2019-07-31 MED ORDER — SODIUM CHLORIDE 0.9% FLUSH: 3.0000 mL | Freq: Once | INTRAVENOUS | Status: DC

## 2019-07-31 NOTE — ED Triage Notes (Signed)
Pt reports episode of aphasia lasting approx 2 min, then resolved. About 9 am this morning. States was normal when he woke up at 5am. No slurred speech, droop, drift. VAN negative.

## 2019-07-31 NOTE — ED Notes (Signed)
Patient Alert and oriented to baseline. Stable and ambulatory to baseline. Patient verbalized understanding of the discharge instructions.  Patient belongings were taken by the patient.   

## 2019-07-31 NOTE — ED Notes (Signed)
Joshua Hess [phone]) called for an update.  Thank you

## 2019-07-31 NOTE — ED Provider Notes (Signed)
[facility] EMERGENCY DEPARTMENT Provider Note   CSN: [id number] Arrival date & time: 07/31/19  1221     History Chief Complaint  Patient presents with  . Aphasia    Joshua Hess is a 79 y.o. male.  79 year old male presents with about 30 second episode of confusion and trouble with forming words.  He denies any associated headache or visual changes.  No weakness in his arms or legs.  No ataxia symptoms resolved spontaneously.  Denies any use of medications prior to this.  Has had multiple episodes like this in the past but has not sought to have them evaluated.  Does have a history of a aortic valve replacement.  Denies any syncope or near syncope.  No chest pain or chest pressure.  Patient states that this occurred earlier today and he has been on his baseline since.  Denies any recent history of illnesses        Past Medical History:  Diagnosis Date  . BPH (benign prostatic hyperplasia)   . 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 Carotid artery stenosis   . Chronotropic incompetence   . ED (erectile dysfunction)   . Elevated PSA   . HOH (hard of hearing)   . Mild CAD   . Postoperative atrial fibrillation ([place])   . Severe aortic stenosis    a. s/p tissue AVR 10/2018.  Marland Kitchen Sinus bradycardia    baseline  . Wears glasses     Patient Active Problem List   Diagnosis Date Noted  . S/P AVR (aortic valve replacement)   . Carotid artery stenosis   . Chronotropic incompetence   . Severe aortic stenosis   . Sinus bradycardia 08/12/2018  . Benign prostatic hyperplasia with nocturia 11/01/2012    Past Surgical History:  Procedure Laterality Date  . AORTIC VALVE REPLACEMENT N/A 11/10/2018   Procedure: AORTIC VALVE REPLACEMENT (AVR), USING 23MM INSPIRIS;  Surgeon: Gaye Pollack, MD;  Location: [place];  Service: Open Heart Surgery;  Laterality: N/A;  . EXTERNAL EAR SURGERY     melanoma removed  . HERNIA REPAIR    . RIGHT/LEFT HEART CATH AND CORONARY  ANGIOGRAPHY N/A 09/29/2018   Procedure: RIGHT/LEFT HEART CATH AND CORONARY ANGIOGRAPHY;  Surgeon: Burnell Blanks, MD;  Location: [place] [facility];  Service: Cardiovascular;  Laterality: N/A;  . TEE WITHOUT CARDIOVERSION N/A 11/10/2018   Procedure: TRANSESOPHAGEAL ECHOCARDIOGRAM (TEE);  Surgeon: Gaye Pollack, MD;  Location[place];  Service: Open Heart Surgery;  Laterality: N/A;  . TRANSCATHETER AORTIC VALVE REPLACEMENT, TRANSFEMORAL     PROCEDURE ATTEMPTED & ABORTED        Family History  Problem Relation Age of Onset  . Heart disease Brother     Social History   Tobacco Use  . Smoking status: Former Smoker    Years: 1.00    Quit date: 1970    Years since quitting: 51.0  . Smokeless tobacco: Never Used  Substance Use Topics  . Alcohol use: No  . Drug use: No    Home Medications Prior to Admission medications   Medication Sig Start Date End Date Taking? Authorizing Provider  ALPRAZolam Duanne Moron) 0.5 MG tablet Take 0.5 mg by mouth at bedtime as needed for anxiety.    [provider]  amoxicillin (AMOXIL) 500 MG tablet Take 2000 mg 1-2 hours prior to dental treatment 05/16/19   Tommie Raymond, NP  aspirin EC 81 MG EC tablet Take 1 tablet (81  mg total) by mouth daily. 11/15/18   Gold, Wayne E, PA-C  rosuvastatin (CRESTOR) 10 MG tablet Take 10 mg by mouth daily. 01/31/19   [provider]  traZODone (DESYREL) 50 MG tablet Take 50 mg by mouth at bedtime as needed (sleep).     [provider]    Allergies    Patient has no known allergies.  Review of Systems   Review of Systems  All other systems reviewed and are negative.   Physical Exam Updated Vital Signs BP (!) 155/79 (BP Location: Left Arm)   Pulse (!) 57   Temp 98.6 F (37 C) (Oral)   Resp 16   Ht 1.778 m (5\' 10" )   Wt 79.8 kg   SpO2 97%   BMI 25.25 kg/m   Physical Exam Vitals and nursing note reviewed.  Constitutional:      General: He is not in acute distress.     Appearance: Normal appearance. He is well-developed. He is not toxic-appearing.  HENT:     Head: Normocephalic and atraumatic.  Eyes:     General: Lids are normal.     Conjunctiva/sclera: Conjunctivae normal.     Pupils: Pupils are equal, round, and reactive to light.  Neck:     Thyroid: No thyroid mass.     Trachea: No tracheal deviation.  Cardiovascular:     Rate and Rhythm: Normal rate and regular rhythm.     Heart sounds: Normal heart sounds. No murmur. No gallop.   Pulmonary:     Effort: Pulmonary effort is normal. No respiratory distress.     Breath sounds: Normal breath sounds. No stridor. No decreased breath sounds, wheezing, rhonchi or rales.  Abdominal:     General: Bowel sounds are normal. There is no distension.     Palpations: Abdomen is soft.     Tenderness: There is no abdominal tenderness. There is no rebound.  Musculoskeletal:        General: No tenderness. Normal range of motion.     Cervical back: Normal range of motion and neck supple.  Skin:    General: Skin is warm and dry.     Findings: No abrasion or rash.  Neurological:     General: No focal deficit present.     Mental Status: He is alert and oriented to person, place, and time.     GCS: GCS eye subscore is 4. GCS verbal subscore is 5. GCS motor subscore is 6.     Cranial Nerves: No cranial nerve deficit.     Sensory: No sensory deficit.     Motor: Motor function is intact.     Gait: Gait normal.  Psychiatric:        Speech: Speech normal.        Behavior: Behavior normal.     ED Results / Procedures / Treatments   Labs (all labs ordered are listed, but only abnormal results are displayed) Labs Reviewed  COMPREHENSIVE METABOLIC PANEL - Abnormal; Notable for the following components:      Result Value   Glucose, Bld 109 (*)    BUN 28 (*)    All other components within normal limits  I-STAT CHEM 8, ED - Abnormal; Notable for the following components:   BUN 29 (*)    Glucose, Bld 105 (*)     Calcium, Ion 1.13 (*)    All other components within normal limits  PROTIME-INR  APTT  CBC  DIFFERENTIAL  CBG MONITORING, ED  EKG EKG Interpretation  Date/Time:  Monday July 31 2019 12:35:05 EST Ventricular Rate:  69 PR Interval:    QRS Duration: 96 QT Interval:  386 QTC Calculation: 413 R Axis:   11 Text Interpretation: Sinus rhythm with frequent Premature ventricular complexes and Premature atrial complexes Anterior infarct , age undetermined Abnormal ECG Confirmed by Lacretia Leigh ([id number]) on 07/31/2019 4:42:39 PM   Radiology CT HEAD WO CONTRAST  Result Date: 07/31/2019 CLINICAL DATA:  Episode of aphasia lasting 2 minutes at 9 a.m. this morning. No reported injury. EXAM: CT HEAD WITHOUT CONTRAST TECHNIQUE: Contiguous axial images were obtained from the base of the skull through the vertex without intravenous contrast. COMPARISON:  11/26/2015 brain MRI. FINDINGS: Brain: No evidence of parenchymal hemorrhage or extra-axial fluid collection. No mass lesion, mass effect, or midline shift. No CT evidence of acute infarction. Nonspecific mild subcortical and periventricular white matter hypodensity, most in keeping with chronic small vessel ischemic change. Cerebral volume is age appropriate. No ventriculomegaly. Vascular: No acute abnormality. Skull: No evidence of calvarial fracture. Sinuses/Orbits: The visualized paranasal sinuses are essentially clear. Other:  The mastoid air cells are unopacified. IMPRESSION: 1.  No evidence of acute intracranial abnormality. 2. Mild chronic small vessel ischemic changes in the cerebral white matter. Electronically Signed   By: Ilona Sorrel M.D.   On: 07/31/2019 14:34    Procedures Procedures (including critical care time)  Medications Ordered in ED Medications  sodium chloride flush (NS) 0.9 % injection 3 mL (has no administration in time range)    ED Course  I have reviewed the triage vital signs and the nursing notes.  Pertinent labs &  imaging results that were available during my care of the patient were reviewed by me and considered in my medical decision making (see chart for details).    MDM Rules/Calculators/A&P                      Patient's head CT and labs here are reassuring.  Patient with possible TIA type symptoms.  He was offered inpatient admission which she has deferred at this time.  He understands the risk of developing a CVA and he accepts this and will follow up with his doctor. Final Clinical Impression(s) / ED Diagnoses Final diagnoses:  None    Rx / DC Orders ED Discharge Orders    None       Lacretia Leigh, MD 07/31/19 1720

## 2019-07-31 NOTE — Discharge Instructions (Addendum)
You were offered admission at this time and have deferred.  Please follow-up with your doctor or return here for any problems

## 2019-07-31 NOTE — Telephone Encounter (Signed)
I spoke to [place], patient's spouse in regards to the patient's stroke symptoms and recommended that the patient go to the ED for further evaluation.  He verbalized understanding.

## 2019-08-02 DIAGNOSIS — R4701 Aphasia: Secondary | ICD-10-CM | POA: Diagnosis not present

## 2019-08-02 DIAGNOSIS — R0781 Pleurodynia: Secondary | ICD-10-CM | POA: Diagnosis not present

## 2019-08-02 DIAGNOSIS — Z952 Presence of prosthetic heart valve: Secondary | ICD-10-CM | POA: Diagnosis not present

## 2019-08-02 DIAGNOSIS — R0989 Other specified symptoms and signs involving the circulatory and respiratory systems: Secondary | ICD-10-CM | POA: Diagnosis not present

## 2019-08-02 DIAGNOSIS — I6523 Occlusion and stenosis of bilateral carotid arteries: Secondary | ICD-10-CM | POA: Diagnosis not present

## 2019-08-02 DIAGNOSIS — W19XXXD Unspecified fall, subsequent encounter: Secondary | ICD-10-CM | POA: Diagnosis not present

## 2019-08-02 DIAGNOSIS — S299XXA Unspecified injury of thorax, initial encounter: Secondary | ICD-10-CM | POA: Diagnosis not present

## 2019-08-03 ENCOUNTER — Encounter: Payer: Self-pay | Admitting: *Deleted

## 2019-08-03 DIAGNOSIS — I6523 Occlusion and stenosis of bilateral carotid arteries: Secondary | ICD-10-CM | POA: Diagnosis not present

## 2019-08-03 DIAGNOSIS — R0989 Other specified symptoms and signs involving the circulatory and respiratory systems: Secondary | ICD-10-CM | POA: Diagnosis not present

## 2019-08-03 DIAGNOSIS — R4701 Aphasia: Secondary | ICD-10-CM | POA: Diagnosis not present

## 2019-08-07 ENCOUNTER — Encounter: Payer: Self-pay | Admitting: Neurology

## 2019-08-07 ENCOUNTER — Ambulatory Visit: Payer: Medicare HMO | Admitting: Neurology

## 2019-08-07 ENCOUNTER — Other Ambulatory Visit: Payer: Self-pay

## 2019-08-07 VITALS — BP 142/92 | HR 58 | Temp 97.4°F | Ht 70.0 in | Wt 179.5 lb

## 2019-08-07 DIAGNOSIS — R479 Unspecified speech disturbances: Secondary | ICD-10-CM | POA: Diagnosis not present

## 2019-08-07 DIAGNOSIS — R4701 Aphasia: Secondary | ICD-10-CM | POA: Diagnosis not present

## 2019-08-07 NOTE — Progress Notes (Signed)
PATIENT: Joshua Hess DOB: [birth year]  Chief Complaint  Patient presents with  . Aphasia    He is here with his spouse, Joshua Hess. ED follow up from episode of speech difficulty lasting approximately two minutes before resolving.  He proceeded to the hospital for full evaluation. No further events have occurred.  Marland Kitchen PCP    Joshua Pretty, MD     HISTORICAL  Joshua Hess is a 79 year old male, seen in request by his primary care physician Dr. Shelia Hess, Joshua Hess for evaluation of transient speech difficulty, he is accompanied by his spouse Joshua Hess at today's visit on August 07, 2019.  I have reviewed and summarized the referring note from the referring physician.  He has past medical history of hyperlipidemia, prostate cancer, carotid artery stenosis, aortic valve replacement, taking aspirin 81 mg daily.  On July 31, 2019, when he tried to communicate with Joshua Hess, he was noted to have word finding difficulties, he denied loss of consciousness, there was no confusion, apparently he is frustrated by his difficulty, recurrent similar symptoms last about few minutes, he was taken to the emergency room,  I personally reviewed CT head without contrast on July 31, 2019: No evidence of acute intracranial abnormality, supratentorium small vessel disease  Laboratory evaluation seen January 2021: CMP showed normal creatinine of 1, hemoglobin of 14.6,  Echocardiogram on January 26, 2019, normal ejection fraction 50 to 55%, impaired relaxation, anterior and lateral wall hypokinesia, mild mitral valve regurgitation, aortic root and ascending aorta are normal, prosthetic aortic valve was not well visualized, peak and mean gradients through the prosthesis at 33 and 31mmHg respectively  He is now back to his baseline, exercise regularly, including running and speed walking less each day, about 3 weeks ago in early January 2021, while running, he fell down, landed on his right shoulder, still complains of right chest  pain  Ultrasound of carotid artery was done by his primary care physician, report pending  Patient has mild hard of hearing, history is mainly from Joshua Hess, who reported patient began to have mild memory loss since 2018, he tends to misplace things, sometimes word finding difficulties, he retired from [employer], 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  His difficulty become most noticeable following general anesthesia and aortic valve replacement in April, over the past few months all has improved.  REVIEW OF SYSTEMS: Full 14 system review of systems performed and notable only for as above All other review of systems were negative.  ALLERGIES: No Known Allergies  HOME MEDICATIONS: Current Outpatient Medications  Medication Sig Dispense Refill  . ALPRAZolam (XANAX) 0.5 MG tablet Take 0.5 mg by mouth at bedtime as needed for anxiety.    Marland Kitchen amoxicillin (AMOXIL) 500 MG tablet Take 2000 mg 1-2 hours prior to dental treatment 16 tablet 1  . Apoaequorin (PREVAGEN PO) Take 1 tablet by mouth daily.    Marland Kitchen aspirin EC 81 MG EC tablet Take 1 tablet (81 mg total) by mouth daily.    . rosuvastatin (CRESTOR) 10 MG tablet Take 10 mg by mouth daily.    . traZODone (DESYREL) 50 MG tablet Take 50 mg by mouth at bedtime as needed (sleep).      No current facility-administered medications for this visit.    PAST MEDICAL HISTORY: Past Medical History:  Diagnosis Date  . Aphasia   . BPH (benign prostatic hyperplasia)   . 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 Carotid artery stenosis   .  Chronotropic incompetence   . ED (erectile dysfunction)   . Elevated PSA   . HOH (hard of hearing)   . Mild CAD   . Plantar fasciitis   . Postoperative atrial fibrillation ([place])   . Severe aortic stenosis    a. s/p tissue AVR 10/2018.  Marland Kitchen Sinus bradycardia    baseline  . Wears glasses     PAST SURGICAL HISTORY: Past Surgical History:  Procedure Laterality Date  . AORTIC VALVE REPLACEMENT N/A  11/10/2018   Procedure: AORTIC VALVE REPLACEMENT (AVR), USING 23MM INSPIRIS;  Surgeon: Gaye Pollack, MD;  Location: [place];  Service: Open Heart Surgery;  Laterality: N/A;  . EXTERNAL EAR SURGERY     melanoma removed  . HERNIA REPAIR Right    inguinal  . RIGHT/LEFT HEART CATH AND CORONARY ANGIOGRAPHY N/A 09/29/2018   Procedure: RIGHT/LEFT HEART CATH AND CORONARY ANGIOGRAPHY;  Surgeon: Burnell Blanks, MD;  Location: [place] [facility];  Service: Cardiovascular;  Laterality: N/A;  . TEE WITHOUT CARDIOVERSION N/A 11/10/2018   Procedure: TRANSESOPHAGEAL ECHOCARDIOGRAM (TEE);  Surgeon: Gaye Pollack, MD;  Location: [place];  Service: Open Heart Surgery;  Laterality: N/A;  . TRANSCATHETER AORTIC VALVE REPLACEMENT, TRANSFEMORAL     PROCEDURE ATTEMPTED & ABORTED     FAMILY HISTORY: Family History  Problem Relation Age of Onset  . Heart attack Mother   . Other Father        unsure of history  . Heart disease Brother   . Dementia Sister     SOCIAL HISTORY: Social History   Socioeconomic History  . Marital status: Married    Spouse name: Not on file  . Number of children: 0  . Years of education: college  . Highest education level: Bachelor's degree (e.g., BA, AB, BS)  Occupational History  . Occupation: Retired-Human Resources  Tobacco Use  . Smoking status: Former Smoker    Years: 1.00    Quit date: 1970    Years since quitting: 51.1  . Smokeless tobacco: Never Used  Substance and Sexual Activity  . Alcohol use: No  . Drug use: No  . Sexual activity: Not on file  Other Topics Concern  . Not on file  Social History Narrative   Caffeine: 2 cups daily   Right-handed.   Lives with spouse.   Social Determinants of Health   Financial Resource Strain:   . Difficulty of Paying Living Expenses: Not on file  Food Insecurity:   . Worried About Charity fundraiser in the Last Year: Not on file  . Ran Out of Food in the Last Year: Not on file  Transportation Needs:   .  Lack of Transportation (Medical): Not on file  . Lack of Transportation (Non-Medical): Not on file  Physical Activity:   . Days of Exercise per Week: Not on file  . Minutes of Exercise per Session: Not on file  Stress:   . Feeling of Stress : Not on file  Social Connections:   . Frequency of Communication with Friends and Family: Not on file  . Frequency of Social Gatherings with Friends and Family: Not on file  . Attends Religious Services: Not on file  . Active Member of Clubs or Organizations: Not on file  . Attends Archivist Meetings: Not on file  . Marital Status: Not on file  Intimate Partner Violence:   . Fear of Current or Ex-Partner: Not on file  . Emotionally Abused: Not on file  .  Physically Abused: Not on file  . Sexually Abused: Not on file     PHYSICAL EXAM   Vitals:   08/07/19 1411  BP: (!) 142/92  Pulse: (!) 58  Temp: (!) 97.4 F (36.3 C)  Weight: 179 lb 8 oz (81.4 kg)  Height: 5\' 10"  (1.778 m)    Not recorded      Body mass index is 25.76 kg/m.  PHYSICAL EXAMNIATION:  Gen: NAD, conversant, well nourised, well groomed                     Cardiovascular: Regular rate rhythm, no peripheral edema, warm, nontender. Eyes: Conjunctivae clear without exudates or hemorrhage Neck: Supple, no carotid bruits. Pulmonary: Clear to auscultation bilaterally   NEUROLOGICAL EXAM:  MENTAL STATUS: Speech:    Speech is normal; fluent and spontaneous with normal comprehension.  Cognition:     Orientation to time, place and person     Normal recent and remote memory     Normal Attention span and concentration     Normal Language, naming, repeating,spontaneous speech     Fund of knowledge   CRANIAL NERVES: CN II: Visual fields are full to confrontation. Pupils are round equal and briskly reactive to light. CN III, IV, VI: extraocular movement are normal. No ptosis. CN V: Facial sensation is intact to light touch CN VII: Face is symmetric with normal  eye closure  CN VIII: Hearing is normal to causal conversation. CN IX, X: Phonation is normal. CN XI: Head turning and shoulder shrug are intact  MOTOR: There is no pronator drift of out-stretched arms. Muscle bulk and tone are normal. Muscle strength is normal.  REFLEXES: Reflexes are 2+ and symmetric at the biceps, triceps, knees, and ankles. Plantar responses are flexor.  SENSORY: Intact to light touch, pinprick and vibratory sensation are intact in fingers and toes.  COORDINATION: There is no trunk or limb dysmetria noted.  GAIT/STANCE: Posture is normal. Gait is steady with normal steps, base, arm swing, and turning. Heel and toe walking are normal. Tandem gait is normal.  Romberg is absent.   DIAGNOSTIC DATA (LABS, IMAGING, TESTING) - I reviewed patient records, labs, notes, testing and imaging myself where available.   ASSESSMENT AND PLAN  MALIKA AURICH is a 79 y.o. male   Acute onset aphasia History of aortic valve replacement for aortic stenosis, bovine valve on November 10, 2018, on aspirin only  MRI of brain contrast to rule out stroke  Differentiation diagnoses also including mild cognitive impairment  Marcial Pacas, M.D. Ph.D.  [facility] [address], [place], [place] [address] Ph: [phone] Fax: [phone]  [id number], Joshua Jew, MD

## 2019-08-08 ENCOUNTER — Other Ambulatory Visit: Payer: Self-pay | Admitting: Surgery

## 2019-08-08 DIAGNOSIS — K4091 Unilateral inguinal hernia, without obstruction or gangrene, recurrent: Secondary | ICD-10-CM | POA: Diagnosis not present

## 2019-08-08 DIAGNOSIS — K429 Umbilical hernia without obstruction or gangrene: Secondary | ICD-10-CM | POA: Diagnosis not present

## 2019-08-16 ENCOUNTER — Telehealth: Payer: Self-pay | Admitting: Neurology

## 2019-08-16 NOTE — Telephone Encounter (Signed)
[employer] Auth: [id number] (exp. 08/16/19 to 09/15/19) order sent to GI. They will reach out to the patient to schedule.

## 2019-08-21 ENCOUNTER — Telehealth: Payer: Self-pay | Admitting: Neurology

## 2019-08-21 NOTE — Telephone Encounter (Signed)
Please get ultrasound of carotid artery report from his primary care physician," it showed blockage per email through [facility] by patient"

## 2019-08-21 NOTE — Telephone Encounter (Signed)
That sounds like a good idea to me. Will defer to neuro on their own clearance.

## 2019-08-22 NOTE — Telephone Encounter (Signed)
Done request faxed to Dr [name] [phone]

## 2019-08-24 ENCOUNTER — Other Ambulatory Visit: Payer: Self-pay

## 2019-08-24 ENCOUNTER — Ambulatory Visit (INDEPENDENT_AMBULATORY_CARE_PROVIDER_SITE_OTHER): Payer: Medicare HMO | Admitting: Physician Assistant

## 2019-08-24 VITALS — BP 171/95 | HR 61 | Temp 97.2°F | Resp 16 | Ht 70.0 in | Wt 177.0 lb

## 2019-08-24 DIAGNOSIS — I6523 Occlusion and stenosis of bilateral carotid arteries: Secondary | ICD-10-CM

## 2019-08-24 DIAGNOSIS — R4701 Aphasia: Secondary | ICD-10-CM | POA: Diagnosis not present

## 2019-08-24 NOTE — Telephone Encounter (Signed)
Report received and placed on Dr. Rhea Belton desk for review.

## 2019-08-24 NOTE — Progress Notes (Signed)
Established Carotid Patient   History of Present Illness   Joshua Hess is a 79 y.o. (1940/10/24) male who returns to clinic to go over carotid duplex.  The study was performed on 08/03/2019 and ordered by his primary care provider.  Based on duplex left ICA stenosis estimated to be 50 to 69% with right ICA stenosis less than 50%.  Duplex was ordered due to a transient speech difficulty episode the patient experienced.  The patient is unsure if this was related to being 'distracted" and not being able to come up with the words or if this truly was related to a TIA/CVA.  CT head at the time was negative.  He was evaluated by neurology on 08/07/2019 who ordered a MRI brain to rule out CVA, which will be performed later this month.  He denies any other symptoms including vision changes or one-sided weakness.  He is taking an aspirin and statin daily.  He is a non-smoker.  He is quite active and runs about 20 to 30 miles weekly.  Surgical history also significant for aortic valve replacement with bovine valve in April 2020 by Dr. Cyndia Bent.   The patient's PMH, PSH, SH, and FamHx were reviewed on and are unchanged from prior visit.  Current Outpatient Medications  Medication Sig Dispense Refill  . ALPRAZolam (XANAX) 0.5 MG tablet Take 0.5 mg by mouth at bedtime as needed for anxiety.    Marland Kitchen amoxicillin (AMOXIL) 500 MG tablet Take 2000 mg 1-2 hours prior to dental treatment 16 tablet 1  . Apoaequorin (PREVAGEN PO) Take 1 tablet by mouth daily.    Marland Kitchen aspirin EC 81 MG EC tablet Take 1 tablet (81 mg total) by mouth daily.    . rosuvastatin (CRESTOR) 10 MG tablet Take 10 mg by mouth daily.    . traZODone (DESYREL) 50 MG tablet Take 50 mg by mouth at bedtime as needed (sleep).      No current facility-administered medications for this visit.    On ROS today: 10 system ROS negative unless otherwise noted in HPI   Physical Examination   Vitals:   08/24/19 0839 08/24/19 0848  BP: (!) 156/94 (!) 171/95   Pulse: 61   Resp: 16   Temp: (!) 97.2 F (36.2 C)   TempSrc: Temporal   SpO2: 100%   Weight: 177 lb (80.3 kg)   Height: 5\' 10"  (1.778 m)    Body mass index is 25.4 kg/m.  General Alert, O x 3, WD, NAD  Neck Supple, mid-line trachea,    Pulmonary Sym exp, good B air movt  Cardiac RRR, Nl S1, S2  Vascular Vessel Right Left  Radial Palpable Palpable  Brachial Palpable Palpable    Musculo- skeletal M/S 5/5 throughout  , Extremities without ischemic changes    Neurologic Cranial nerves 2-12 intact , Pain and light touch intact in extremities , Motor exam as listed above    Non-Invasive Vascular Imaging   B Carotid Duplex (08/03/19): /  R ICA stenosis:  1-39%/  R VA:  patent and antegrade  L ICA stenosis:  50-69%  L VA:  patent and antegrade   Medical Decision Making   Joshua Hess is a 78 y.o. male who presents to go over carotid duplex   Carotid duplex findings of 50 to 69% stenosis of left ICA similar to 01/2018 carotid duplex  MRI brain scheduled later this month per neurology  I offered the patient further workup with CTA neck given that  the concern is for an aphasic episode related to a L sided ICA stenosis.  I also think it would be reasonable to proceed conservatively with repeat carotid duplex in 6 months given the vague symptoms and duplex findings.  The patient is not concerned with this episode of speech difficulty as he has history of an occasional stutter or difficulty putting his thoughts into words in the past.  He would rather proceed conservatively and his partner Joshua Hess, who was on speaker phone during visit, agrees.  Continue aspirin and statin  Recheck carotid duplex in 6 months   Joshua Ligas PA-C [facility] Office: [phone]  Clinic MD: Joshua Hess

## 2019-08-25 ENCOUNTER — Other Ambulatory Visit: Payer: Self-pay | Admitting: *Deleted

## 2019-08-25 DIAGNOSIS — I6523 Occlusion and stenosis of bilateral carotid arteries: Secondary | ICD-10-CM

## 2019-09-07 NOTE — Telephone Encounter (Signed)
I reviewed ultrasound of carotid artery from [facility] dated August 03, 2019: 50 to 69% stenosis in the left cervical internal carotid artery, mild to moderate plaque in the distal common carotid artery bulb, and proximal left internal carotid artery, less than 50% stenosis in the right internal carotid artery.  Patient was seen by vascular surgeon on Aug 24 2019:  Joshua Hess is a 79 y.o. male who presents to go over carotid duplex   Carotid duplex findings of 50 to 69% stenosis of left ICA similar to 01/2018 carotid duplex  MRI brain scheduled later this month per neurology  I offered the patient further workup with CTA neck given that the concern is for an aphasic episode related to a L sided ICA stenosis.  I also think it would be reasonable to proceed conservatively with repeat carotid duplex in 6 months given the vague symptoms and duplex findings.  The patient is not concerned with this episode of speech difficulty as he has history of an occasional stutter or difficulty putting his thoughts into words in the past.  He would rather proceed conservatively and his partner [name], who was on speaker phone during visit, agrees.  Continue aspirin and statin  Recheck carotid duplex in 6 months   Dagoberto Ligas PA-C [facility] Office: [phone]

## 2019-09-09 ENCOUNTER — Other Ambulatory Visit: Payer: Self-pay

## 2019-09-09 ENCOUNTER — Ambulatory Visit
Admission: RE | Admit: 2019-09-09 | Discharge: 2019-09-09 | Disposition: A | Discharge status: Home / Self Care | Payer: Medicare HMO | Source: Ambulatory Visit | Attending: Neurology | Admitting: Neurology

## 2019-09-09 DIAGNOSIS — R479 Unspecified speech disturbances: Secondary | ICD-10-CM | POA: Diagnosis not present

## 2019-10-11 NOTE — Progress Notes (Signed)
PATIENT: Joshua Hess DOB: [birth year]  REASON FOR VISIT: follow up HISTORY FROM: patient  HISTORY OF PRESENT ILLNESS: Today 10/12/19  HISTORY Coreyon TYKWAN Hess is a 79 year old male, seen in request by his primary care physician Dr. [name] for evaluation of transient speech difficulty, he is accompanied by his spouse Joshua Hess at today's visit on August 07, 2019.  I have reviewed and summarized the referring note from the referring physician.  He has past medical history of hyperlipidemia, prostate cancer, carotid artery stenosis, aortic valve replacement, taking aspirin 81 mg daily.  On July 31, 2019, when he tried to communicate with Joshua Hess, he was noted to have word finding difficulties, he denied loss of consciousness, there was no confusion, apparently he is frustrated by his difficulty, recurrent similar symptoms last about few minutes, he was taken to the emergency room,  I personally reviewed CT head without contrast on July 31, 2019: No evidence of acute intracranial abnormality, supratentorium small vessel disease  Laboratory evaluation seen January 2021: CMP showed normal creatinine of 1, hemoglobin of 14.6,  Echocardiogram on January 26, 2019, normal ejection fraction 50 to 55%, impaired relaxation, anterior and lateral wall hypokinesia, mild mitral valve regurgitation, aortic root and ascending aorta are normal, prosthetic aortic valve was not well visualized, peak and mean gradients through the prosthesis at 33 and 55mmHg respectively  He is now back to his baseline, exercise regularly, including running and speed walking less each day, about 3 weeks ago in early January 2021, while running, he fell down, landed on his right shoulder, still complains of right chest pain  Ultrasound of carotid artery was done by his primary care physician, report pending  Patient has mild hard of hearing, history is mainly from [place], who reported patient began to have mild memory  loss since 2018, he tends to misplace things, sometimes word finding difficulties, he retired from [employer], 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  His difficulty become most noticeable following general anesthesia and aortic valve replacement in April, over the past few months all has improved.   Update October 12, 2019 SS: He is planning to have hernia surgery with Dr. Ninfa Linden (pre-op April 12th).  MRI of the brain 09/09/2019 showed mild chronic small vessel ischemic disease, no acute findings.  Copied Dr. Krista Blue 09/07/2019: I reviewed ultrasound of carotid artery from [facility] dated August 03, 2019: 50 to 69% stenosis in the left cervical internal carotid artery, mild to moderate plaque in the distal common carotid artery bulb, and proximal left internal carotid artery, less than 50% stenosis in the right internal carotid artery.  Was seen by vascular surgeon 08/24/2019, cardiac duplex findings 50 to 69% stenosis of left ICA similar to July 2019 carotid duplex.  Offered CTA neck, given concern for aphasic episode related to left-sided ICA stenosis.  Patient decided to proceed conservatively, repeat carotid duplex in 6 months, patient not overly concerned with episode of speech difficulty, reports history of occasional stutter, difficulty putting his thoughts into words.  He will continue aspirin and statin, recheck carotid duplex in 6 months  He walks or runs 3 miles every other day, works out in the yard. Is hard of hearing. Having a lot of dental work, implants, is big runner marathons in his life, since 1 year ago having health problems, disappointed about things. Taking trazodone at night for sleep, rarely Xanax.  No further episodes of aphasia. Here today with Joshua Hess  REVIEW OF SYSTEMS: Out  of a complete 14 system review of symptoms, the patient complains only of the following symptoms, and all other reviewed systems are negative.  Aphasia, hard of  hearing  ALLERGIES: No Known Allergies  HOME MEDICATIONS: Outpatient Medications Prior to Visit  Medication Sig Dispense Refill  . ALPRAZolam (XANAX) 0.5 MG tablet Take 0.5 mg by mouth at bedtime as needed for anxiety.    Marland Kitchen amoxicillin (AMOXIL) 500 MG tablet Take 2000 mg 1-2 hours prior to dental treatment 16 tablet 1  . Apoaequorin (PREVAGEN PO) Take 1 tablet by mouth daily.    Marland Kitchen aspirin EC 81 MG EC tablet Take 1 tablet (81 mg total) by mouth daily.    . rosuvastatin (CRESTOR) 10 MG tablet Take 10 mg by mouth daily.    . traZODone (DESYREL) 50 MG tablet Take 50 mg by mouth at bedtime as needed (sleep).      No facility-administered medications prior to visit.    PAST MEDICAL HISTORY: Past Medical History:  Diagnosis Date  . Aphasia   . BPH (benign prostatic hyperplasia)   . Cancer ([place])    behind ear- melomona  . Carotid artery stenosis   . Chronotropic incompetence   . ED (erectile dysfunction)   . Elevated PSA   . HOH (hard of hearing)   . Mild CAD   . Plantar fasciitis   . Postoperative atrial fibrillation ([place])   . Severe aortic stenosis    a. s/p tissue AVR 10/2018.  Marland Kitchen Sinus bradycardia    baseline  . Wears glasses     PAST SURGICAL HISTORY: Past Surgical History:  Procedure Laterality Date  . AORTIC VALVE REPLACEMENT N/A 11/10/2018   Procedure: AORTIC VALVE REPLACEMENT (AVR), USING 23MM INSPIRIS;  Surgeon: Gaye Pollack, MD;  Location: [place];  Service: Open Heart Surgery;  Laterality: N/A;  . EXTERNAL EAR SURGERY     melanoma removed  . HERNIA REPAIR Right    inguinal  . RIGHT/LEFT HEART CATH AND CORONARY ANGIOGRAPHY N/A 09/29/2018   Procedure: RIGHT/LEFT HEART CATH AND CORONARY ANGIOGRAPHY;  Surgeon: Burnell Blanks, MD;  Location: [place] [facility];  Service: Cardiovascular;  Laterality: N/A;  . TEE WITHOUT CARDIOVERSION N/A 11/10/2018   Procedure: TRANSESOPHAGEAL ECHOCARDIOGRAM (TEE);  Surgeon: Gaye Pollack, MD;  Location: [place];  Service: Open  Heart Surgery;  Laterality: N/A;  . TRANSCATHETER AORTIC VALVE REPLACEMENT, TRANSFEMORAL     PROCEDURE ATTEMPTED & ABORTED     FAMILY HISTORY: Family History  Problem Relation Age of Onset  . Heart attack Mother   . Other Father        unsure of history  . Heart disease Brother   . Dementia Sister     SOCIAL HISTORY: Social History   Socioeconomic History  . Marital status: Married    Spouse name: Not on file  . Number of children: 0  . Years of education: college  . Highest education level: Bachelor's degree (e.g., BA, AB, BS)  Occupational History  . Occupation: Retired-Human Resources  Tobacco Use  . Smoking status: Former Smoker    Years: 1.00    Quit date: 1970    Years since quitting: 51.2  . Smokeless tobacco: Never Used  Substance and Sexual Activity  . Alcohol use: No  . Drug use: No  . Sexual activity: Not on file  Other Topics Concern  . Not on file  Social History Narrative   Caffeine: 2 cups daily   Right-handed.   Lives with spouse.  Social Determinants of Health   Financial Resource Strain:   . Difficulty of Paying Living Expenses:   Food Insecurity:   . Worried About Charity fundraiser in the Last Year:   . Arboriculturist in the Last Year:   Transportation Needs:   . Film/video editor (Medical):   Marland Kitchen Lack of Transportation (Non-Medical):   Physical Activity:   . Days of Exercise per Week:   . Minutes of Exercise per Session:   Stress:   . Feeling of Stress :   Social Connections:   . Frequency of Communication with Friends and Family:   . Frequency of Social Gatherings with Friends and Family:   . Attends Religious Services:   . Active Member of Clubs or Organizations:   . Attends Archivist Meetings:   Marland Kitchen Marital Status:   Intimate Partner Violence:   . Fear of Current or Ex-Partner:   . Emotionally Abused:   Marland Kitchen Physically Abused:   . Sexually Abused:    PHYSICAL EXAM  Vitals:   10/12/19 0849  BP: (!) 145/78   Pulse: (!) 54  Temp: (!) 97.2 F (36.2 C)  Weight: 177 lb (80.3 kg)  Height: 5\' 9"  (1.753 m)   Body mass index is 26.14 kg/m.  Generalized: Well developed, in no acute distress   Neurological examination  Mentation: Alert oriented to time, place. Follows all commands speech and language fluent, somewhat hard of hearing, most of history is provided by significant other Cranial nerve II-XII: Pupils were equal round reactive to light. Extraocular movements were full, visual field were full on confrontational test. Facial sensation and strength were normal.  Head turning and shoulder shrug  were normal and symmetric. Motor: The motor testing reveals 5 over 5 strength of all 4 extremities. Good symmetric motor tone is noted throughout.  Sensory: Sensory testing is intact to soft touch on all 4 extremities. No evidence of extinction is noted.  Coordination: Cerebellar testing reveals good finger-nose-finger and heel-to-shin bilaterally.  Gait and station: Gait is normal. Tandem gait is normal. Romberg is negative. No drift is seen.  Reflexes: Deep tendon reflexes are symmetric and normal bilaterally.   DIAGNOSTIC DATA (LABS, IMAGING, TESTING) - I reviewed patient records, labs, notes, testing and imaging myself where available.  Lab Results  Component Value Date   WBC 6.5 07/31/2019   HGB 14.6 07/31/2019   HCT 43.0 07/31/2019   MCV 97.6 07/31/2019   PLT 183 07/31/2019      Component Value Date/Time   NA 139 07/31/2019 1313   NA 142 09/26/2018 1142   K 4.4 07/31/2019 1313   CL 104 07/31/2019 1313   CO2 24 07/31/2019 1250   GLUCOSE 105 (H) 07/31/2019 1313   BUN 29 (H) 07/31/2019 1313   BUN 25 09/26/2018 1142   CREATININE 1.00 07/31/2019 1313   CREATININE 0.91 11/01/2012 1209   CALCIUM 9.2 07/31/2019 1250   PROT 6.8 07/31/2019 1250   ALBUMIN 3.8 07/31/2019 1250   AST 23 07/31/2019 1250   ALT 23 07/31/2019 1250   ALKPHOS 66 07/31/2019 1250   BILITOT 0.9 07/31/2019 1250    GFRNONAA >60 07/31/2019 1250   GFRAA >60 07/31/2019 1250   No results found for: CHOL, HDL, LDLCALC, LDLDIRECT, TRIG, CHOLHDL Lab Results  Component Value Date   HGBA1C 5.0 11/04/2018   Lab Results  Component Value Date   [id number] 05/23/2015   Lab Results  Component Value Date   TSH 1.132 11/13/2018  ASSESSMENT AND PLAN 79 y.o. year old male  has a past medical history of Aphasia, BPH (benign prostatic hyperplasia), Cancer ([place]), Carotid artery stenosis, Chronotropic incompetence, ED (erectile dysfunction), Elevated PSA, HOH (hard of hearing), Mild CAD, Plantar fasciitis, Postoperative atrial fibrillation ([facility]), Severe aortic stenosis, Sinus bradycardia, and Wears glasses. here with:  1.  Acute onset aphasia, July 31, 2019  2.  History of aortic valve replacement for aortic stenosis, bovine valve November 10, 2018, on aspirin alone  -MRI of the brain 09/09/2019 showed mild chronic small vessel ischemic disease, no acute findings -Carotid artery ultrasound 08/03/2019 showed 50 to 69% stenosis of the left cervical internal carotid artery, mild to moderate plaque in the distal common carotid artery bulb, proximal left internal carotid artery, less than 50% stenosis in the right internal carotid artery -Seeing vascular surgery, plan to repeat in carotid duplex 6 months (August), remain on aspirin and statin -No further episodes of aphasia, will be having hernia surgery, hearing evaluation for hearing loss -We will have patient follow-up in 5 months with Dr. Krista Blue following repeat carotid duplex  I spent 30 minutes of face-to-face and non-face-to-face time with patient.  This included previsit chart review, lab review, study review, order entry, electronic health record documentation, patient education.  Butler Denmark, AGNP-C, DNP 10/12/2019, 9:32 AM [facility] [address], [place], [place] [address] [phone]

## 2019-10-12 ENCOUNTER — Encounter: Payer: Self-pay | Admitting: Neurology

## 2019-10-12 ENCOUNTER — Other Ambulatory Visit: Payer: Self-pay

## 2019-10-12 ENCOUNTER — Ambulatory Visit: Payer: Medicare HMO | Admitting: Neurology

## 2019-10-12 VITALS — BP 145/78 | HR 54 | Temp 97.2°F | Ht 69.0 in | Wt 177.0 lb

## 2019-10-12 DIAGNOSIS — R4701 Aphasia: Secondary | ICD-10-CM | POA: Diagnosis not present

## 2019-10-12 NOTE — Patient Instructions (Signed)
It was nice to see you today! Call for any further episodes Keep [place] appointment with vascular  See you back in 5 months

## 2019-10-17 ENCOUNTER — Other Ambulatory Visit: Payer: Self-pay | Admitting: Cardiovascular Disease

## 2019-10-17 NOTE — Telephone Encounter (Signed)
Pt's pharmacy is requesting a refill on amoxicillin. Would Dr. Angelena Form like to refill this medication? Please address

## 2019-10-23 ENCOUNTER — Other Ambulatory Visit: Payer: Self-pay | Admitting: Surgery

## 2019-10-23 DIAGNOSIS — L821 Other seborrheic keratosis: Secondary | ICD-10-CM | POA: Diagnosis not present

## 2019-10-23 DIAGNOSIS — L570 Actinic keratosis: Secondary | ICD-10-CM | POA: Diagnosis not present

## 2019-10-23 DIAGNOSIS — C44722 Squamous cell carcinoma of skin of right lower limb, including hip: Secondary | ICD-10-CM | POA: Diagnosis not present

## 2019-10-23 DIAGNOSIS — L814 Other melanin hyperpigmentation: Secondary | ICD-10-CM | POA: Diagnosis not present

## 2019-10-23 DIAGNOSIS — K4091 Unilateral inguinal hernia, without obstruction or gangrene, recurrent: Secondary | ICD-10-CM | POA: Diagnosis not present

## 2019-10-23 DIAGNOSIS — C44729 Squamous cell carcinoma of skin of left lower limb, including hip: Secondary | ICD-10-CM | POA: Diagnosis not present

## 2019-10-23 DIAGNOSIS — D692 Other nonthrombocytopenic purpura: Secondary | ICD-10-CM | POA: Diagnosis not present

## 2019-10-23 DIAGNOSIS — D1801 Hemangioma of skin and subcutaneous tissue: Secondary | ICD-10-CM | POA: Diagnosis not present

## 2019-10-23 DIAGNOSIS — K429 Umbilical hernia without obstruction or gangrene: Secondary | ICD-10-CM | POA: Diagnosis not present

## 2019-10-23 DIAGNOSIS — Z85828 Personal history of other malignant neoplasm of skin: Secondary | ICD-10-CM | POA: Diagnosis not present

## 2019-10-23 DIAGNOSIS — Z85820 Personal history of malignant melanoma of skin: Secondary | ICD-10-CM | POA: Diagnosis not present

## 2019-11-02 ENCOUNTER — Other Ambulatory Visit: Payer: Self-pay

## 2019-11-02 ENCOUNTER — Ambulatory Visit: Payer: Medicare HMO | Admitting: Cardiovascular Disease

## 2019-11-02 ENCOUNTER — Encounter: Payer: Self-pay | Admitting: Cardiovascular Disease

## 2019-11-02 VITALS — BP 130/68 | HR 46 | Ht 69.0 in | Wt 176.6 lb

## 2019-11-02 DIAGNOSIS — I350 Nonrheumatic aortic (valve) stenosis: Secondary | ICD-10-CM | POA: Diagnosis not present

## 2019-11-02 DIAGNOSIS — I2510 Atherosclerotic heart disease of native coronary artery without angina pectoris: Secondary | ICD-10-CM

## 2019-11-02 DIAGNOSIS — Z01810 Encounter for preprocedural cardiovascular examination: Secondary | ICD-10-CM

## 2019-11-02 NOTE — Progress Notes (Signed)
Chief Complaint  Patient presents with  . Follow-up    CAD   History of Present Illness: 79 yo male with history of severe aortic stenosis s/p AVR, post op atrial fib, mild CAD and carotid artery disease who is here today for cardiac follow up. He underwent surgical AVR 11/10/18. He had been worked up for TAVR but a mobile mass was seen in the aortic sinus so the TAVR was cancelled. 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 He was started on ASA and Metoprolol post op. Due to a brief run of atrial fib post op, amiodarone was used but stopped due to nausea. He continue to struggle with nausea and fatigue post op so metoprolol was stopped. He had anxiety following his surgery and has been followed in primary care for this. Echo 01/26/19 with LVEF=50-55%. The bioprosthetic AVR is working well with mean gradient 16 mmHg.   He is here today for follow up. The patient denies any chest pain, dyspnea, palpitations, lower extremity edema, orthopnea, PND, dizziness, near syncope or syncope. He is running again. He is feeling well overall. Only c/o nerve pain over sternal incision. He has an upcoming dental surgery and an upcoming hernia surgery.   Primary Care Physician: [name], MD  Past Medical History:  Diagnosis Date  . Aphasia   . BPH (benign prostatic hyperplasia)   . Cancer ([place])    behind ear- melomona  . Carotid artery stenosis   . Chronotropic incompetence   . ED (erectile dysfunction)   . Elevated PSA   . HOH (hard of hearing)   . Mild CAD   . Plantar fasciitis   . Postoperative atrial fibrillation ([place])   . Severe aortic stenosis    a. s/p tissue AVR 10/2018.  Marland Kitchen Sinus bradycardia    baseline  . Wears glasses     Past Surgical History:  Procedure Laterality Date  . AORTIC VALVE REPLACEMENT N/A 11/10/2018   Procedure: AORTIC VALVE REPLACEMENT (AVR), USING 23MM INSPIRIS;  Surgeon: Gaye Pollack, MD;  Location: [place];   Service: Open Heart Surgery;  Laterality: N/A;  . EXTERNAL EAR SURGERY     melanoma removed  . HERNIA REPAIR Right    inguinal  . RIGHT/LEFT HEART CATH AND CORONARY ANGIOGRAPHY N/A 09/29/2018   Procedure: RIGHT/LEFT HEART CATH AND CORONARY ANGIOGRAPHY;  Surgeon: Burnell Blanks, MD;  Location: [place] [facility];  Service: Cardiovascular;  Laterality: N/A;  . TEE WITHOUT CARDIOVERSION N/A 11/10/2018   Procedure: TRANSESOPHAGEAL ECHOCARDIOGRAM (TEE);  Surgeon: Gaye Pollack, MD;  Location: [place];  Service: Open Heart Surgery;  Laterality: N/A;  . TRANSCATHETER AORTIC VALVE REPLACEMENT, TRANSFEMORAL     PROCEDURE ATTEMPTED & ABORTED     Current Outpatient Medications  Medication Sig Dispense Refill  . ALPRAZolam (XANAX) 0.5 MG tablet Take 0.5 mg by mouth daily as needed for anxiety.     Marland Kitchen amoxicillin (AMOXIL) 500 MG capsule TAKE 4 CAPSULES (2000 mg) BY MOUTH 60 MINUTES PRIOR TO DENTAL APPOINTMENT (Patient taking differently: Take 2,000 mg by mouth See admin instructions. TAKE 4 CAPSULES (2000 mg) BY MOUTH 60 MINUTES PRIOR TO DENTAL APPOINTMENT) 16 capsule 1  . ampicillin (PRINCIPEN) 500 MG capsule Take 500 mg by mouth 3 (three) times daily.     Marland Kitchen Apoaequorin (PREVAGEN PO) Take 1 tablet by mouth daily.    Marland Kitchen aspirin EC 81 MG EC tablet Take 1 tablet (81 mg total) by  mouth daily.    . chlorhexidine (PERIDEX) 0.12 % solution 15 mLs by Mouth Rinse route in the morning and at bedtime.    Marland Kitchen ibuprofen (ADVIL) 600 MG tablet Take 600 mg by mouth every 6 (six) hours as needed for pain.    . rosuvastatin (CRESTOR) 10 MG tablet Take 10 mg by mouth daily.    . sildenafil (REVATIO) 20 MG tablet Take 20 mg by mouth daily as needed (ED).    . traZODone (DESYREL) 50 MG tablet Take 50-75 mg by mouth at bedtime.      No current facility-administered medications for this visit.    No Known Allergies  Social History   Socioeconomic History  . Marital status: Married    Spouse name: Not on file  .  Number of children: 0  . Years of education: college  . Highest education level: Bachelor's degree (e.g., BA, AB, BS)  Occupational History  . Occupation: Retired-Human Resources  Tobacco Use  . Smoking status: Former Smoker    Years: 1.00    Quit date: 1970    Years since quitting: 51.3  . Smokeless tobacco: Never Used  Substance and Sexual Activity  . Alcohol use: No  . Drug use: No  . Sexual activity: Not on file  Other Topics Concern  . Not on file  Social History Narrative   Caffeine: 2 cups daily   Right-handed.   Lives with spouse.   Social Determinants of Health   Financial Resource Strain:   . Difficulty of Paying Living Expenses:   Food Insecurity:   . Worried About Charity fundraiser in the Last Year:   . Arboriculturist in the Last Year:   Transportation Needs:   . Film/video editor (Medical):   Marland Kitchen Lack of Transportation (Non-Medical):   Physical Activity:   . Days of Exercise per Week:   . Minutes of Exercise per Session:   Stress:   . Feeling of Stress :   Social Connections:   . Frequency of Communication with Friends and Family:   . Frequency of Social Gatherings with Friends and Family:   . Attends Religious Services:   . Active Member of Clubs or Organizations:   . Attends Archivist Meetings:   Marland Kitchen Marital Status:   Intimate Partner Violence:   . Fear of Current or Ex-Partner:   . Emotionally Abused:   Marland Kitchen Physically Abused:   . Sexually Abused:     Family History  Problem Relation Age of Onset  . Heart attack Mother   . Other Father        unsure of history  . Heart disease Brother   . Dementia Sister     Review of Systems:  As stated in the HPI and otherwise negative.   BP 130/68   Pulse (!) 46   Ht 5\' 9"  (1.753 m)   Wt 176 lb 9.6 oz (80.1 kg)   SpO2 98%   BMI 26.08 kg/m   Physical Examination:  General: Well developed, well nourished, NAD  HEENT: OP clear, mucus membranes moist  SKIN: warm, dry. No  rashes. Neuro: No focal deficits  Musculoskeletal: Muscle strength 5/5 all ext  Psychiatric: Mood and affect normal  Neck: No JVD, no carotid bruits, no thyromegaly, no lymphadenopathy.  Lungs:Clear bilaterally, no wheezes, rhonci, crackles Cardiovascular: Regular rate and rhythm. Soft systolic murmur.  Abdomen:Soft. Bowel sounds present. Non-tender.  Extremities: No lower extremity edema. Pulses are 2 + in the  bilateral DP/PT.  Echo July 2020:  1. The left ventricle has low normal systolic function, with an ejection fraction of 50-55%. The cavity size was normal. Left ventricular diastolic Doppler parameters are consistent with impaired relaxation.  2. LVEF is normal at 50 to 55% with anterior and lateral hypokinesis.  3. The right ventricle has normal systolic function.  4. AV prosthesis is difficult to see. Peak and mean gradients through the prosthesis are 33 and 16 mm Hg respectively.  EKG:  EKG is not ordered today. The ekg ordered today demonstrates   Recent Labs: 11/04/2018: B Natriuretic Peptide 395.7 11/13/2018: Magnesium 1.9; TSH 1.132 07/31/2019: ALT 23; BUN 29; Creatinine, Ser 1.00; Hemoglobin 14.6; Platelets 183; Potassium 4.4; Sodium 139   Lipid Panel No results found for: CHOL, TRIG, HDL, CHOLHDL, VLDL, LDLCALC, LDLDIRECT   Wt Readings from Last 3 Encounters:  11/02/19 176 lb 9.6 oz (80.1 kg)  10/12/19 177 lb (80.3 kg)  08/24/19 177 lb (80.3 kg)     Assessment and Plan:   1. Aortic stenosis: He is now s/p surgical AVR on 11/10/18 and doing well. The bioprosthetic AVR is working well by echo July 2020. Will continue ASA daily and SBE prophylaxis as need prior to dental procedures and other indicated surgeries.   2. CAD without angina: Mild CAD on cath pre AVR in 2020. He did not tolerate beta blockers due to fatigue. Will continue ASA and statin.   3. Post op atrial fibrillation: No recurrence since then. No indication for anticoagulation.   4. Pre-operative cardiac  risk assessment: He has no limitations. He is very active. Known to have mild CAD. No worrisome cardiac findings. He can proceed with his planned dental surgery and hernia surgery. OK to hold ASA one week prior to his procedure if this is necessary.   Current medicines are reviewed at length with the patient today.  The patient does not have concerns regarding medicines.  The following changes have been made:  no change  Labs/ tests ordered today include:   No orders of the defined types were placed in this encounter.    Disposition:   FU with me in 12 months.    Signed, Lauree Chandler, MD 11/02/2019 12:20 PM    [facility] [place], [place], [place]  [address] Phone: [phone]; Fax: [phone]

## 2019-11-02 NOTE — Patient Instructions (Signed)

## 2019-11-08 ENCOUNTER — Encounter (HOSPITAL_COMMUNITY)
Admission: RE | Admit: 2019-11-08 | Discharge: 2019-11-08 | Disposition: A | Discharge status: Home / Self Care | Payer: Medicare HMO | Source: Ambulatory Visit | Attending: Surgery | Admitting: Surgery

## 2019-11-08 ENCOUNTER — Other Ambulatory Visit: Payer: Self-pay

## 2019-11-08 ENCOUNTER — Encounter (HOSPITAL_COMMUNITY): Payer: Self-pay

## 2019-11-08 DIAGNOSIS — K4091 Unilateral inguinal hernia, without obstruction or gangrene, recurrent: Secondary | ICD-10-CM | POA: Insufficient documentation

## 2019-11-08 DIAGNOSIS — Z01812 Encounter for preprocedural laboratory examination: Secondary | ICD-10-CM | POA: Diagnosis not present

## 2019-11-08 DIAGNOSIS — Z79899 Other long term (current) drug therapy: Secondary | ICD-10-CM | POA: Diagnosis not present

## 2019-11-08 DIAGNOSIS — Z7982 Long term (current) use of aspirin: Secondary | ICD-10-CM | POA: Diagnosis not present

## 2019-11-08 LAB — CBC
HCT: 45.7 % (ref 39.0–52.0)
Hemoglobin: 15.6 g/dL (ref 13.0–17.0)
MCH: 33.5 pg (ref 26.0–34.0)
MCHC: 34.1 g/dL (ref 30.0–36.0)
MCV: 98.3 fL (ref 80.0–100.0)
Platelets: 171 10*3/uL (ref 150–400)
RBC: 4.65 MIL/uL (ref 4.22–5.81)
RDW: 12.1 % (ref 11.5–15.5)
WBC: 8.3 10*3/uL (ref 4.0–10.5)
nRBC: 0 % (ref 0.0–0.2)

## 2019-11-08 LAB — BASIC METABOLIC PANEL
Anion gap: 10 (ref 5–15)
BUN: 31 mg/dL — ABNORMAL HIGH (ref 8–23)
CO2: 26 mmol/L (ref 22–32)
Calcium: 9 mg/dL (ref 8.9–10.3)
Chloride: 104 mmol/L (ref 98–111)
Creatinine, Ser: 1.01 mg/dL (ref 0.61–1.24)
GFR calc Af Amer: >60 mL/min (ref >60)
GFR calc non Af Amer: >60 mL/min (ref >60)
Glucose, Bld: 100 mg/dL — ABNORMAL HIGH (ref 70–99)
Potassium: 4.6 mmol/L (ref 3.5–5.1)
Sodium: 140 mmol/L (ref 135–145)

## 2019-11-08 NOTE — Progress Notes (Signed)
PCP Shelia Media, MD Cardiologist Angelena Form, MD   Chest x-ray - 12/06/18 EKG - 08/01/19 Stress Test - pt deneis ECHO - 01/26/19 Cardiac Cath - 09/29/18   Blood Thinner Instructions: n/a Aspirin Instructions: Follow your surgeon's instructions on when to stop Aspirin.  If no instructions were given by your surgeon then you will need to call the office to get those instructions.     ERAS Protcol - yes PRE-SURGERY Ensure or G2- ensure  COVID TEST- 11/11/19   Coronavirus Screening  Have you experienced the following symptoms:  Cough yes/no: No Fever (>100.71F)  yes/no: No Runny nose yes/no: No Sore throat yes/no: No Difficulty breathing/shortness of breath  yes/no: No  Have you or a family member traveled in the last 14 days and where? yes/no: No   If the patient indicates "YES" to the above questions, their PAT will be rescheduled to limit the exposure to others and, the surgeon will be notified. THE PATIENT WILL NEED TO BE ASYMPTOMATIC FOR 14 DAYS.   If the patient is not experiencing any of these symptoms, the PAT nurse will instruct them to NOT bring anyone with them to their appointment since they may have these symptoms or traveled as well.   Please remind your patients and families that hospital visitation restrictions are in effect and the importance of the restrictions.     Anesthesia review: yes - cardiac hx  Patient denies shortness of breath, fever, cough and chest pain at PAT appointment   All instructions explained to the patient, with a verbal understanding of the material. Patient agrees to go over the instructions while at home for a better understanding. Patient also instructed to self quarantine after being tested for COVID-19. The opportunity to ask questions was provided.

## 2019-11-08 NOTE — Pre-Procedure Instructions (Signed)
Joshua Hess [facility] [address] [place] [address] Phone: [phone] Fax: [phone]  Joshua Hess at [facility], [place] - [address] [place] [address] Phone: [phone] Fax: [phone]     Your procedure is scheduled on Wednesday, May 5 from 09:45 AM- 10:45 AM.  Report to [facility]tr[facility] "A" at 07:45 A.M., and check in at the Admitting office.  Call this number if you have problems the morning of surgery:  [phone]  Call [phone] if you have any questions prior to your surgery date Monday-Friday 8am-4pm.    Remember:  Do not eat after midnight the night before your surgery.  You may drink clear liquids until 06:45 AM the morning of your surgery.    Clear liquids allowed are: Water, Non-Citrus Juices (without pulp), Carbonated Beverages, Clear Tea, Black Coffee Only, and Gatorade.   Enhanced Recovery after Surgery Enhanced Recovery after Surgery is a protocol used to improve the stress on your body and your recovery after surgery.  . The day of surgery:  o Drink ONE (1) Pre-Surgery Clear Ensure by 06:45 AM  o This drink was given to you during your hospital  o Nothing else to drink after completing the  Pre-Surgery Clear Ensure.     Take these medicines the morning of surgery with A SIP OF WATER : rosuvastatin (CRESTOR)  IF NEEDED: ALPRAZolam (XANAX)  Follow your surgeon's instructions on when to stop Aspirin.  If no instructions were given by your surgeon then you will need to call the office to get those instructions.    As of today, STOP taking any Aspirin containing products, Aleve, Naproxen, Ibuprofen, Motrin, Advil, Goody's, BC's, all herbal medications, fish oil, and all vitamins.                      Do not wear jewelry.            Do not wear lotions, powders, colognes, or deodorant.            Men may shave face and neck.            Do  not bring valuables to the hospital.            [facility] is not responsible for any belongings or valuables.  Do NOT Smoke (Tobacco/Vapping) or drink Alcohol 24 hours prior to your procedure.  If you use a CPAP at night, you may bring all equipment for your overnight stay.   Contacts, glasses, dentures or bridgework may not be worn into surgery.      For patients admitted to the hospital, discharge time will be determined by your treatment team.   Patients discharged the day of surgery will not be allowed to drive home, and someone needs to stay with them for 24 hours.    Special instructions:   [facility]- Preparing For Surgery  Before surgery, you can play an important role. Because skin is not sterile, your skin needs to be as free of germs as possible. You can reduce the number of germs on your skin by washing with CHG (chlorahexidine gluconate) Soap before surgery.  CHG is an antiseptic cleaner which kills germs and bonds with the skin to continue killing germs even after washing.    Oral Hygiene is also important to reduce your risk of infection.  Remember - BRUSH YOUR TEETH  THE MORNING OF SURGERY WITH YOUR REGULAR TOOTHPASTE  Please do not use if you have an allergy to CHG or antibacterial soaps. If your skin becomes reddened/irritated stop using the CHG.  Do not shave (including legs and underarms) for at least 48 hours prior to first CHG shower. It is OK to shave your face.  Please follow these instructions carefully.   1. Shower the NIGHT BEFORE SURGERY and the MORNING OF SURGERY with CHG Soap.   2. If you chose to wash your hair, wash your hair first as usual with your normal shampoo.  3. After you shampoo, rinse your hair and body thoroughly to remove the shampoo.  4. Use CHG as you would any other liquid soap. You can apply CHG directly to the skin and wash gently with a scrungie or a clean washcloth.   5. Apply the CHG Soap to your body ONLY FROM THE NECK DOWN.   Do not use on open wounds or open sores. Avoid contact with your eyes, ears, mouth and genitals (private parts). Wash Face and genitals (private parts)  with your normal soap.   6. Wash thoroughly, paying special attention to the area where your surgery will be performed.  7. Thoroughly rinse your body with warm water from the neck down.  8. DO NOT shower/wash with your normal soap after using and rinsing off the CHG Soap.  9. Pat yourself dry with a CLEAN TOWEL.  10. Wear CLEAN PAJAMAS to bed the night before surgery, wear comfortable clothes the morning of surgery  11. Place CLEAN SHEETS on your bed the night of your first shower and DO NOT SLEEP WITH PETS.   Day of Surgery:    Do not apply any deodorants/lotions.  Please wear clean clothes to the hospital/surgery center.   Remember to brush your teeth WITH YOUR REGULAR TOOTHPASTE.   Please read over the following fact sheets that you were given.

## 2019-11-09 NOTE — Anesthesia Preprocedure Evaluation (Addendum)
Anesthesia Evaluation  Patient identified by MRN, date of birth, ID band Patient awake    Reviewed: Allergy & Precautions, NPO status , Patient's Chart, lab work & pertinent test results  Airway Mallampati: II  TM Distance: >3 FB Neck ROM: Full    Dental  (+) Teeth Intact, Missing, Partial Lower   Pulmonary former smoker,    Pulmonary exam normal breath sounds clear to auscultation       Cardiovascular + CAD  + Valvular Problems/Murmurs AS  Rhythm:Regular Rate:Normal + Systolic murmurs    Neuro/Psych negative neurological ROS  negative psych ROS   GI/Hepatic negative GI ROS, Neg liver ROS,   Endo/Other  negative endocrine ROS  Renal/GU negative Renal ROS     Musculoskeletal negative musculoskeletal ROS (+)   Abdominal   Peds  Hematology negative hematology ROS (+)   Anesthesia Other Findings   Reproductive/Obstetrics                                                            Anesthesia Evaluation  Patient identified by MRN, date of birth, ID band Patient awake    Reviewed: Allergy & Precautions, NPO status , Patient's Chart, lab work & pertinent test results  Airway Mallampati: I  TM Distance: >3 FB Neck ROM: Full    Dental no notable dental hx.    Pulmonary former smoker,    Pulmonary exam normal breath sounds clear to auscultation       Cardiovascular + Valvular Problems/Murmurs AS  Rhythm:Regular Rate:Normal + Systolic murmurs ECG: SR, rate 59  CATH: Prox RCA to Mid RCA lesion is 20% stenosed. Mid RCA lesion is 30% stenosed. Ost Cx to Prox Cx lesion is 30% stenosed. Prox LAD lesion is 10% stenosed. Mid LAD lesion is 20% stenosed. Mid LAD to Dist LAD lesion is 20% stenosed.  1. Mild non-obstructive CAD 2. Severe aortic stenosis by echo (by cath mean gradient 17. 26mmHg and peak to peak gradient 24 mmHg)  ECHO:  1. The left ventricle appears to be mildly  increased in size, have normal wall thickness, with 60-65%. Echo evidence of impaired relaxation in diastolic filling patterns.  2. Right ventricular systolic pressure is is moderately elevated.  3. The right ventricle is normal in size, has normal wall thickness and normal systolic function.  4. Normal left atrial size.  5. Normal right atrial size.  6. The mitral valve is degenerative.  7. Normal tricuspid valve.  8. Aortic valve regurgitation is moderate by color flow Doppler.  9. Aortic valve tricuspid. 10. Severe stenosis of the aortic valve. 11. There is moderate sclerosis of the aortic valve. 12. There is severe thickening of the aortic valve. 13. No atrial level shunt detected by color flow Doppler.   Neuro/Psych negative neurological ROS  negative psych ROS   GI/Hepatic negative GI ROS, Neg liver ROS,   Endo/Other  negative endocrine ROS  Renal/GU negative Renal ROS     Musculoskeletal negative musculoskeletal ROS (+)   Abdominal   Peds  Hematology negative hematology ROS (+)   Anesthesia Other Findings SEVERE AS  Reproductive/Obstetrics                           Anesthesia Physical Anesthesia Plan  ASA: IV  Anesthesia  Plan: General   Post-op Pain Management:    Induction: Intravenous  PONV Risk Score and Plan: 2 and Midazolam, Ondansetron, Dexamethasone and Treatment may vary due to age or medical condition  Airway Management Planned: Oral ETT  Additional Equipment: Arterial line, TEE and CVP  Intra-op Plan:   Post-operative Plan: Post-operative intubation/ventilation  Informed Consent: I have reviewed the patients History and Physical, chart, labs and discussed the procedure including the risks, benefits and alternatives for the proposed anesthesia with the patient or authorized representative who has indicated his/her understanding and acceptance.     Dental advisory given  Plan Discussed with: CRNA  Anesthesia Plan  Comments:       Anesthesia Quick Evaluation  Anesthesia Physical Anesthesia Plan  ASA: III  Anesthesia Plan: General   Post-op Pain Management:    Induction: Intravenous  PONV Risk Score and Plan: 4 or greater and Ondansetron, Dexamethasone and Treatment may vary due to age or medical condition  Airway Management Planned: Oral ETT  Additional Equipment: None  Intra-op Plan:   Post-operative Plan: Extubation in OR  Informed Consent: I have reviewed the patients History and Physical, chart, labs and discussed the procedure including the risks, benefits and alternatives for the proposed anesthesia with the patient or authorized representative who has indicated his/her understanding and acceptance.     Dental advisory given  Plan Discussed with: CRNA  Anesthesia Plan Comments: (Follows with cardiology for hx of severe aortic stenosis s/p bioprosthetic AVR 11/10/18, post op atrial fib, mild CAD and carotid artery disease. Last seen by Dr. Angelena Form 11/02/19, doing well, cleared for surgery. Per note, "Pre-operative cardiac risk assessment: He has no limitations. He is very active. Known to have mild CAD. No worrisome cardiac findings. He can proceed with his planned dental surgery and hernia surgery. OK to hold ASA one week prior to his procedure if this is necessary."  Being followed by vascular for carotid stenosis. Last seen 08/24/19. Per note, recent carotid duplex was ordered by PCP to eval transient episode of difficulty with word finding which showed 50 to 69% stenosis of left ICA similar to 01/2018 carotid duplex. Recommended repeat in 6 months. Pt was also seen by neurology regarding this, MRI of the brain 09/09/2019 showed mild chronic small vessel ischemic disease, no acute findings. They also recommended f/u carotid duplex in 6 months as planned.   Preop labs reviewed, unremarkable.   EKG 07/31/19: Sinus rhythm with frequent Premature ventricular complexes and Premature  atrial complexes. Rate 69. Anterior infarct , age undetermined  TTE 01/26/19: 1. The left ventricle has low normal systolic function, with an ejection  fraction of 50-55%. The cavity size was normal. Left ventricular diastolic  Doppler parameters are consistent with impaired relaxation.  2. LVEF is normal at 50 to 55% with anterior and lateral hypokinesis.  3. The right ventricle has normal systolic function.  4. AV prosthesis is difficult to see. Peak and mean gradients through the  prosthesis are 33 and 16 mm Hg respectively.   Event monitor 01/05/19: Sinus rhythm with sinus bradycardia Short runs of supraventricular tachycardia. (longest 9 beats) Rare premature ventricular contractions Rare premature atrial contractions   Cath 09/29/18 (pre AVR): 1. Mild non-obstructive CAD 2. Severe aortic stenosis by echo (by cath mean gradient 17. 31mmHg and peak to peak gradient 24 mmHg))       Anesthesia Quick Evaluation

## 2019-11-09 NOTE — Progress Notes (Signed)
Anesthesia Chart Review:  Follows with cardiology for hx of severe aortic stenosis s/p bioprosthetic AVR 11/10/18, post op atrial fib, mild CAD and carotid artery disease. Last seen by Dr. Angelena Form 11/02/19, doing well, cleared for surgery. Per note, "Pre-operative cardiac risk assessment: He has no limitations. He is very active. Known to have mild CAD. No worrisome cardiac findings. He can proceed with his planned dental surgery and hernia surgery. OK to hold ASA one week prior to his procedure if this is necessary."  Being followed by vascular for carotid stenosis. Last seen 08/24/19. Per note, recent carotid duplex was ordered by PCP to eval transient episode of difficulty with word finding which showed 50 to 69% stenosis of left ICA similar to 01/2018 carotid duplex. Recommended repeat in 6 months. Pt was also seen by neurology regarding this, MRI of the brain 09/09/2019 showed mild chronic small vessel ischemic disease, no acute findings. They also recommended f/u carotid duplex in 6 months as planned.   Preop labs reviewed, unremarkable.   EKG 07/31/19: Sinus rhythm with frequent Premature ventricular complexes and Premature atrial complexes. Rate 69. Anterior infarct , age undetermined  TTE 01/26/19: 1. The left ventricle has low normal systolic function, with an ejection  fraction of 50-55%. The cavity size was normal. Left ventricular diastolic  Doppler parameters are consistent with impaired relaxation.  2. LVEF is normal at 50 to 55% with anterior and lateral hypokinesis.  3. The right ventricle has normal systolic function.  4. AV prosthesis is difficult to see. Peak and mean gradients through the  prosthesis are 33 and 16 mm Hg respectively.   Event monitor 01/05/19: Sinus rhythm with sinus bradycardia Short runs of supraventricular tachycardia. (longest 9 beats) Rare premature ventricular contractions Rare premature atrial contractions   Cath 09/29/18 (pre AVR): 1. Mild  non-obstructive CAD 2. Severe aortic stenosis by echo (by cath mean gradient 17. 39mmHg and peak to peak gradient 24 mmHg)   Karoline Caldwell, [facility] Short Stay Center/Anesthesiology Phone [phone] 11/09/2019 11:22 AM

## 2019-11-11 ENCOUNTER — Other Ambulatory Visit (HOSPITAL_COMMUNITY)
Admission: RE | Admit: 2019-11-11 | Discharge: 2019-11-11 | Disposition: A | Discharge status: Home / Self Care | Payer: Medicare HMO | Source: Ambulatory Visit | Attending: Surgery | Admitting: Surgery

## 2019-11-11 DIAGNOSIS — Z01812 Encounter for preprocedural laboratory examination: Secondary | ICD-10-CM | POA: Insufficient documentation

## 2019-11-11 DIAGNOSIS — Z20822 Contact with and (suspected) exposure to covid-19: Secondary | ICD-10-CM | POA: Diagnosis not present

## 2019-11-11 LAB — SARS CORONAVIRUS 2 (TAT 6-24 HRS): SARS Coronavirus 2: NEGATIVE

## 2019-11-14 NOTE — H&P (Signed)
   Ronnald Collum Documented: 10/23/2019 1:30 PM Location: [facility] Patient #: [id number] DOB: 1941/04/09 Married / Language: Cleophus Molt / Race: White Male   History of Present Illness Nathaneil Canary A. Ninfa Linden MD; 10/23/2019 1:44 PM) The patient is a 79 year old male who presents for an evaluation of a hernia. He is here for a follow-up today with his partner regarding his right inguinal hernia which is recurrent and umbilical hernia. He has been cleared by neurology for general anesthesia and hernia repair. He has noticed no change in the hernias.   Allergies (Chanel Teressa Senter, CMA; 10/23/2019 1:31 PM) No Known Allergies  [08/08/2019]: No Known Drug Allergies  [08/08/2019]: Allergies Reconciled   Medication History (Chanel Teressa Senter, CMA; 10/23/2019 1:31 PM) Aspirin (81MG  Tablet, Oral) Active. traZODone HCl (50MG  Tablet, Oral) Active. Rosuvastatin Calcium (10MG  Tablet, Oral) Active. Apoaequorin (Oral) Specific strength unknown - Active. Medications Reconciled  Vitals (Chanel Nolan CMA; 10/23/2019 1:31 PM) 10/23/2019 1:31 PM Weight: 176 lb Height: 70in Body Surface Area: 1.98 m Body Mass Index: 25.25 kg/m  Temp.: 98.34F  Pulse: 36 (Regular)  BP: 136/72(Sitting, Left Arm, Standard)       Physical Exam (Forest Pruden A. Ninfa Linden MD; 10/23/2019 1:44 PM) The physical exam findings are as follows: Note: On exam, there is a chronically incarcerated umbilical hernia and a tiny very medial right inguinal hernia without obvious left inguinal hernia    Assessment & Plan (Isak Sotomayor A. Ninfa Linden MD; 10/23/2019 1:45 PM) RECURRENT RIGHT INGUINAL HERNIA (K40.91) Impression: We again discussed the diagnosis as well as proceeding with a laparoscopic right inguinal hernia repair with mesh and an open umbilical hernia repair with possible mesh. I again discussed the risks in detail. I will still be able to evaluate the left side arthroscopically as well in place mesh there if needed.  They wished to proceed with surgery UMBILICAL HERNIA ([id number])

## 2019-11-15 ENCOUNTER — Encounter (HOSPITAL_COMMUNITY): Payer: Self-pay | Admitting: Surgery

## 2019-11-15 ENCOUNTER — Encounter (HOSPITAL_COMMUNITY)
Admission: RE | Disposition: A | Discharge status: Home / Self Care | Payer: Self-pay | Source: Ambulatory Visit | Attending: Surgery

## 2019-11-15 ENCOUNTER — Ambulatory Visit (HOSPITAL_COMMUNITY): Payer: Medicare HMO | Admitting: Anesthesiology

## 2019-11-15 ENCOUNTER — Other Ambulatory Visit: Payer: Self-pay

## 2019-11-15 ENCOUNTER — Ambulatory Visit (HOSPITAL_COMMUNITY)
Admission: RE | Admit: 2019-11-15 | Discharge: 2019-11-15 | Disposition: A | Discharge status: Home / Self Care | Payer: Medicare HMO | Source: Ambulatory Visit | Attending: Surgery | Admitting: Surgery

## 2019-11-15 ENCOUNTER — Ambulatory Visit (HOSPITAL_COMMUNITY): Payer: Medicare HMO | Admitting: Physician Assistant

## 2019-11-15 DIAGNOSIS — Z7982 Long term (current) use of aspirin: Secondary | ICD-10-CM | POA: Insufficient documentation

## 2019-11-15 DIAGNOSIS — I2510 Atherosclerotic heart disease of native coronary artery without angina pectoris: Secondary | ICD-10-CM | POA: Insufficient documentation

## 2019-11-15 DIAGNOSIS — Z79899 Other long term (current) drug therapy: Secondary | ICD-10-CM | POA: Insufficient documentation

## 2019-11-15 DIAGNOSIS — Z87891 Personal history of nicotine dependence: Secondary | ICD-10-CM | POA: Insufficient documentation

## 2019-11-15 DIAGNOSIS — I350 Nonrheumatic aortic (valve) stenosis: Secondary | ICD-10-CM | POA: Diagnosis not present

## 2019-11-15 DIAGNOSIS — K420 Umbilical hernia with obstruction, without gangrene: Secondary | ICD-10-CM | POA: Diagnosis not present

## 2019-11-15 DIAGNOSIS — K429 Umbilical hernia without obstruction or gangrene: Secondary | ICD-10-CM | POA: Diagnosis not present

## 2019-11-15 DIAGNOSIS — K4091 Unilateral inguinal hernia, without obstruction or gangrene, recurrent: Secondary | ICD-10-CM | POA: Diagnosis not present

## 2019-11-15 DIAGNOSIS — K4090 Unilateral inguinal hernia, without obstruction or gangrene, not specified as recurrent: Secondary | ICD-10-CM | POA: Diagnosis not present

## 2019-11-15 HISTORY — PX: INGUINAL HERNIA REPAIR: SHX194

## 2019-11-15 HISTORY — PX: UMBILICAL HERNIA REPAIR: SHX196

## 2019-11-15 SURGERY — REPAIR, HERNIA, INGUINAL, LAPAROSCOPIC
Anesthesia: General | Site: Inguinal | Laterality: Right

## 2019-11-15 MED ORDER — GLYCOPYRROLATE PF 0.2 MG/ML IJ SOSY
PREFILLED_SYRINGE | INTRAMUSCULAR | Status: DC | PRN
  Administered 2019-11-15: .2 mg via INTRAVENOUS

## 2019-11-15 MED ORDER — ACETAMINOPHEN 500 MG PO TABS: 1000.0000 mg | ORAL_TABLET | ORAL | Status: AC

## 2019-11-15 MED ORDER — TRAMADOL HCL 50 MG PO TABS: 50.0000 mg | ORAL_TABLET | Freq: Four times a day (QID) | ORAL | 0 refills | Status: DC | PRN

## 2019-11-15 MED ORDER — GABAPENTIN 300 MG PO CAPS
ORAL_CAPSULE | ORAL | Status: AC
  Administered 2019-11-15: 300 mg via ORAL
  Filled 2019-11-15: qty 1

## 2019-11-15 MED ORDER — EPHEDRINE SULFATE 50 MG/ML IJ SOLN
INTRAMUSCULAR | Status: DC | PRN
Start: 2019-11-15 — End: 2019-11-15
  Administered 2019-11-15: 10 mg via INTRAVENOUS

## 2019-11-15 MED ORDER — ACETAMINOPHEN 500 MG PO TABS
ORAL_TABLET | ORAL | Status: AC
  Administered 2019-11-15: 1000 mg via ORAL
  Filled 2019-11-15: qty 2

## 2019-11-15 MED ORDER — ONDANSETRON HCL 4 MG/2ML IJ SOLN
INTRAMUSCULAR | Status: AC
  Filled 2019-11-15: qty 2

## 2019-11-15 MED ORDER — BUPIVACAINE HCL (PF) 0.25 % IJ SOLN
INTRAMUSCULAR | Status: DC | PRN
  Administered 2019-11-15: 20 mL

## 2019-11-15 MED ORDER — PROPOFOL 10 MG/ML IV BOLUS
INTRAVENOUS | Status: AC
  Filled 2019-11-15: qty 20

## 2019-11-15 MED ORDER — ENSURE PRE-SURGERY PO LIQD: 296.0000 mL | Freq: Once | ORAL | Status: DC

## 2019-11-15 MED ORDER — BUPIVACAINE HCL (PF) 0.25 % IJ SOLN
INTRAMUSCULAR | Status: AC
  Filled 2019-11-15: qty 30

## 2019-11-15 MED ORDER — ONDANSETRON HCL 4 MG/2ML IJ SOLN
4.0000 mg | Freq: Once | INTRAMUSCULAR | Status: AC | PRN
  Administered 2019-11-15: 4 mg via INTRAVENOUS

## 2019-11-15 MED ORDER — FENTANYL CITRATE (PF) 100 MCG/2ML IJ SOLN
INTRAMUSCULAR | Status: DC | PRN
  Administered 2019-11-15 (×3): 50 ug via INTRAVENOUS

## 2019-11-15 MED ORDER — MEPERIDINE HCL 25 MG/ML IJ SOLN: 6.2500 mg | INTRAMUSCULAR | Status: DC | PRN

## 2019-11-15 MED ORDER — CHLORHEXIDINE GLUCONATE CLOTH 2 % EX PADS: 6.0000 | MEDICATED_PAD | Freq: Once | CUTANEOUS | Status: DC

## 2019-11-15 MED ORDER — OXYCODONE HCL 5 MG/5ML PO SOLN: 5.0000 mg | Freq: Once | ORAL | Status: DC | PRN

## 2019-11-15 MED ORDER — ROCURONIUM BROMIDE 10 MG/ML (PF) SYRINGE
PREFILLED_SYRINGE | INTRAVENOUS | Status: DC | PRN
  Administered 2019-11-15: 50 mg via INTRAVENOUS

## 2019-11-15 MED ORDER — 0.9 % SODIUM CHLORIDE (POUR BTL) OPTIME
TOPICAL | Status: DC | PRN
  Administered 2019-11-15: 1000 mL

## 2019-11-15 MED ORDER — DEXAMETHASONE SODIUM PHOSPHATE 10 MG/ML IJ SOLN
INTRAMUSCULAR | Status: AC
  Filled 2019-11-15: qty 3

## 2019-11-15 MED ORDER — HYDROMORPHONE HCL 1 MG/ML IJ SOLN
INTRAMUSCULAR | Status: AC
  Filled 2019-11-15: qty 1

## 2019-11-15 MED ORDER — PHENYLEPHRINE 40 MCG/ML (10ML) SYRINGE FOR IV PUSH (FOR BLOOD PRESSURE SUPPORT)
PREFILLED_SYRINGE | INTRAVENOUS | Status: AC
  Filled 2019-11-15: qty 10

## 2019-11-15 MED ORDER — ONDANSETRON HCL 4 MG/2ML IJ SOLN
INTRAMUSCULAR | Status: DC | PRN
  Administered 2019-11-15: 4 mg via INTRAVENOUS

## 2019-11-15 MED ORDER — LIDOCAINE 2% (20 MG/ML) 5 ML SYRINGE
INTRAMUSCULAR | Status: DC | PRN
  Administered 2019-11-15: 100 mg via INTRAVENOUS

## 2019-11-15 MED ORDER — PROPOFOL 10 MG/ML IV BOLUS
INTRAVENOUS | Status: DC | PRN
  Administered 2019-11-15: 110 mg via INTRAVENOUS

## 2019-11-15 MED ORDER — GABAPENTIN 300 MG PO CAPS: 300.0000 mg | ORAL_CAPSULE | ORAL | Status: AC

## 2019-11-15 MED ORDER — ONDANSETRON HCL 4 MG/2ML IJ SOLN
INTRAMUSCULAR | Status: AC
  Filled 2019-11-15: qty 6

## 2019-11-15 MED ORDER — FENTANYL CITRATE (PF) 250 MCG/5ML IJ SOLN
INTRAMUSCULAR | Status: AC
  Filled 2019-11-15: qty 5

## 2019-11-15 MED ORDER — OXYCODONE HCL 5 MG PO TABS: 5.0000 mg | ORAL_TABLET | Freq: Once | ORAL | Status: DC | PRN

## 2019-11-15 MED ORDER — LACTATED RINGERS IV SOLN: INTRAVENOUS | Status: DC | PRN

## 2019-11-15 MED ORDER — EPHEDRINE 5 MG/ML INJ
INTRAVENOUS | Status: AC
  Filled 2019-11-15: qty 10

## 2019-11-15 MED ORDER — LIDOCAINE 2% (20 MG/ML) 5 ML SYRINGE
INTRAMUSCULAR | Status: AC
  Filled 2019-11-15: qty 10

## 2019-11-15 MED ORDER — DEXAMETHASONE SODIUM PHOSPHATE 10 MG/ML IJ SOLN
INTRAMUSCULAR | Status: DC | PRN
  Administered 2019-11-15: 10 mg via INTRAVENOUS

## 2019-11-15 MED ORDER — CEFAZOLIN SODIUM-DEXTROSE 2-4 GM/100ML-% IV SOLN
2.0000 g | INTRAVENOUS | Status: AC
  Administered 2019-11-15: 2 g via INTRAVENOUS

## 2019-11-15 MED ORDER — SUGAMMADEX SODIUM 200 MG/2ML IV SOLN
INTRAVENOUS | Status: DC | PRN
  Administered 2019-11-15: 200 mg via INTRAVENOUS

## 2019-11-15 MED ORDER — STERILE WATER FOR IRRIGATION IR SOLN
Status: DC | PRN
  Administered 2019-11-15: 1000 mL

## 2019-11-15 MED ORDER — CEFAZOLIN SODIUM-DEXTROSE 2-4 GM/100ML-% IV SOLN
INTRAVENOUS | Status: AC
  Filled 2019-11-15: qty 100

## 2019-11-15 MED ORDER — HYDROMORPHONE HCL 1 MG/ML IJ SOLN
0.2500 mg | INTRAMUSCULAR | Status: DC | PRN
  Administered 2019-11-15 (×2): 0.5 mg via INTRAVENOUS

## 2019-11-15 MED ORDER — ROCURONIUM BROMIDE 10 MG/ML (PF) SYRINGE
PREFILLED_SYRINGE | INTRAVENOUS | Status: AC
  Filled 2019-11-15: qty 10

## 2019-11-15 SURGICAL SUPPLY — 46 items
APPLIER CLIP LOGIC TI 5 (MISCELLANEOUS) IMPLANT
BLADE CLIPPER SURG (BLADE) ×1 IMPLANT
CANISTER SUCT 3000ML PPV (MISCELLANEOUS) IMPLANT
CHLORAPREP W/TINT 26 (MISCELLANEOUS) ×3 IMPLANT
COVER SURGICAL LIGHT HANDLE (MISCELLANEOUS) ×3 IMPLANT
COVER WAND RF STERILE (DRAPES) ×3 IMPLANT
DECANTER SPIKE VIAL GLASS SM (MISCELLANEOUS) ×3 IMPLANT
DERMABOND ADVANCED (GAUZE/BANDAGES/DRESSINGS) ×1
DERMABOND ADVANCED .7 DNX12 (GAUZE/BANDAGES/DRESSINGS) ×2 IMPLANT
DEVICE SECURE STRAP 25 ABSORB (INSTRUMENTS) ×3 IMPLANT
DISSECT BALLN SPACEMKR + OVL (BALLOONS) ×3
DISSECTOR BALLN SPACEMKR + OVL (BALLOONS) ×2 IMPLANT
DISSECTOR BLUNT TIP ENDO 5MM (MISCELLANEOUS) IMPLANT
DRAPE LAPAROSCOPIC ABDOMINAL (DRAPES) ×3 IMPLANT
DRAPE LAPAROTOMY 100X72 PEDS (DRAPES) ×3 IMPLANT
ELECT REM PT RETURN 9FT ADLT (ELECTROSURGICAL) ×3
ELECTRODE REM PT RTRN 9FT ADLT (ELECTROSURGICAL) ×2 IMPLANT
GLOVE SURG SIGNA 7.5 PF LTX (GLOVE) ×3 IMPLANT
GOWN STRL REUS W/ TWL LRG LVL3 (GOWN DISPOSABLE) ×4 IMPLANT
GOWN STRL REUS W/ TWL XL LVL3 (GOWN DISPOSABLE) ×2 IMPLANT
GOWN STRL REUS W/TWL LRG LVL3 (GOWN DISPOSABLE) ×2
GOWN STRL REUS W/TWL XL LVL3 (GOWN DISPOSABLE) ×1
KIT BASIN OR (CUSTOM PROCEDURE TRAY) ×3 IMPLANT
KIT TURNOVER KIT B (KITS) ×3 IMPLANT
MESH 3DMAX 4X6 RT LRG (Mesh General) ×1 IMPLANT
NDL HYPO 25GX1X1/2 BEV (NEEDLE) ×2 IMPLANT
NEEDLE HYPO 25GX1X1/2 BEV (NEEDLE) ×3 IMPLANT
NS IRRIG 1000ML POUR BTL (IV SOLUTION) ×3 IMPLANT
PACK GENERAL/GYN (CUSTOM PROCEDURE TRAY) ×3 IMPLANT
PAD ARMBOARD 7.5X6 YLW CONV (MISCELLANEOUS) ×3 IMPLANT
PENCIL SMOKE EVACUATOR (MISCELLANEOUS) ×3 IMPLANT
SCISSORS LAP 5X35 DISP (ENDOMECHANICALS) ×1 IMPLANT
SET IRRIG TUBING LAPAROSCOPIC (IRRIGATION / IRRIGATOR) IMPLANT
SET TROCAR LAP APPLE-HUNT 5MM (ENDOMECHANICALS) ×3 IMPLANT
SET TUBE SMOKE EVAC HIGH FLOW (TUBING) ×3 IMPLANT
SUT MNCRL AB 4-0 PS2 18 (SUTURE) ×3 IMPLANT
SUT NOVA NAB DX-16 0-1 5-0 T12 (SUTURE) ×3 IMPLANT
SUT VIC AB 3-0 SH 27 (SUTURE)
SUT VIC AB 3-0 SH 27X BRD (SUTURE) ×2 IMPLANT
SYR CONTROL 10ML LL (SYRINGE) ×3 IMPLANT
TOWEL GREEN STERILE (TOWEL DISPOSABLE) ×3 IMPLANT
TOWEL GREEN STERILE FF (TOWEL DISPOSABLE) ×3 IMPLANT
TRAY FOLEY W/BAG SLVR 16FR (SET/KITS/TRAYS/PACK)
TRAY FOLEY W/BAG SLVR 16FR ST (SET/KITS/TRAYS/PACK) IMPLANT
TRAY LAPAROSCOPIC MC (CUSTOM PROCEDURE TRAY) ×3 IMPLANT
WATER STERILE IRR 1000ML POUR (IV SOLUTION) ×3 IMPLANT

## 2019-11-15 NOTE — Interval H&P Note (Signed)
History and Physical Interval Note: no change in H and P  11/15/2019 8:14 AM  Joshua Hess  has presented today for surgery, with the diagnosis of RIGHT INGUINAL HERNIA, UMBILICAL HERNIA.  The various methods of treatment have been discussed with the patient and family. After consideration of risks, benefits and other options for treatment, the patient has consented to  Procedure(s): LAPAROSCOPIC RIGHT INGUINAL HERNIA REPAIR WITH MESH (Right) UMBILICAL HERNIA REPAIR WITH POSSIBLE MESH (N/A) as a surgical intervention.  The patient's history has been reviewed, patient examined, no change in status, stable for surgery.  I have reviewed the patient's chart and labs.  Questions were answered to the patient's satisfaction.     Coralie Keens

## 2019-11-15 NOTE — Op Note (Signed)
LAPAROSCOPIC RIGHT INGUINAL HERNIA REPAIR WITH MESH, UMBILICAL HERNIA REPAIR  Procedure Note  Joshua Hess 11/15/2019   Pre-op Diagnosis: RIGHT INGUINAL HERNIA, UMBILICAL HERNIA     Post-op Diagnosis: same  Procedure(s): LAPAROSCOPIC RIGHT INGUINAL HERNIA REPAIR WITH MESH UMBILICAL HERNIA REPAIR  Surgeon(s): Coralie Keens, MD  Anesthesia: General  Staff:  Circulator: Carlynn Purl, RN Scrub Person: Hitchcock, Delight Stare, RN; Lois Huxley RN First Assistant: Cyd Silence, RN  Estimated Blood Loss: Minimal               Procedure: The patient was brought to the operating room identifies correct patient.  He is placed upon the operating table and general anesthesia was induced.  His abdomen was prepped and draped in usual sterile fashion.  I made a transverse incision at the lower edge of the umbilicus with a scalpel.  I take this down to the fascia which was opened just to the right of the midline.  The rectus muscle was then elevated.  I placed the dissecting balloon underneath the rectus muscle and manipulated toward the pubis.  The dissecting balloon was then insufflated dissecting out the preperitoneal space.  The balloon was then removed.  Insufflation was then begun with carbon dioxide.  I placed two 5 mm trochars in the patient's lower midline under direct vision.  The patient had had a previous open right inguinal hernia repair.  As I was dissecting out the cord structures I can see a piece of mesh which appeared to be a plug that had been placed through the internal ring.  I freed up the hernia sac from this plug.  The plug of mesh was very thickened and I cannot cut it out or remove it without converting to an open procedure.  I thus decided to leave it in place.  There was no evidence of direct hernia.  Once I had the hernia sac completely reduced I then brought a piece of large Prolene 3 DMax mesh onto the field.  I placed the right-sided piece of mesh through the  umbilical trocar and opened as an onlay on the inguinal floor.  I then tacked the mesh to Cooper's ligament, up the medial abdominal wall, and slightly laterally covering the previous plug and cord structures well.  At this point, hemostasis appeared to be achieved.  All ports were removed under direct vision and the preperitoneal space collapsed appropriately leaving the mesh in place.  I next dissected out the umbilical hernia defect with the cautery.  I excised the sac and gain entrance to the abdominal cavity.  I then closed the fascial defect with 2 separate #1 Novafil sutures.  I then closed the fascial defect at the laparoscopic repair site with a figure-of-eight 0 Vicryl suture.  All incisions were anesthetized Marcaine I performed a right ilioinguinal nerve block with Marcaine as well.  All incisions were then closed with 4-0 Monocryl sutures and Dermabond.  The patient tolerated the procedure well.  All the counts were correct at the end of the procedure.  The patient was then extubated in the operating room and taken in stable addition to the recovery room.          Coralie Keens   Date: 11/15/2019  Time: 10:47 AM

## 2019-11-15 NOTE — Transfer of Care (Signed)
Immediate Anesthesia Transfer of Care Note  Patient: Joshua Hess  Procedure(s) Performed: LAPAROSCOPIC RIGHT INGUINAL HERNIA REPAIR WITH MESH (Right Inguinal) UMBILICAL HERNIA REPAIR (N/A Abdomen)  Patient Location: PACU  Anesthesia Type:General  Level of Consciousness: awake, alert  and oriented  Airway & Oxygen Therapy: Patient Spontanous Breathing and Patient connected to nasal cannula oxygen  Post-op Assessment: Report given to RN, Post -op Vital signs reviewed and stable and Patient moving all extremities X 4  Post vital signs: Reviewed and stable  Last Vitals:  Vitals Value Taken Time  BP 151/78 11/15/19 1101  Temp    Pulse 57 11/15/19 1101  Resp 15 11/15/19 1101  SpO2 100 % 11/15/19 1101  Vitals shown include unvalidated device data.  Last Pain: There were no vitals filed for this visit.       Complications: No apparent anesthesia complications

## 2019-11-15 NOTE — Discharge Instructions (Signed)
[facility] _______[facility] ° °UMBILICAL OR INGUINAL HERNIA REPAIR: POST OP INSTRUCTIONS ° °Always review your discharge instruction sheet given to you by the facility where your surgery was performed. °IF YOU HAVE DISABILITY OR FAMILY LEAVE FORMS, YOU MUST BRING THEM TO THE OFFICE FOR PROCESSING.   °DO NOT GIVE THEM TO YOUR DOCTOR. ° °1. A  prescription for pain medication may be given to you upon discharge.  Take your pain medication as prescribed, if needed.  If narcotic pain medicine is not needed, then you may take acetaminophen (Tylenol) or ibuprofen (Advil) as needed. °2. Take your usually prescribed medications unless otherwise directed. °If you need a refill on your pain medication, please contact your pharmacy.  They will contact our office to request authorization. Prescriptions will not be filled after 5 pm or on week-ends. °3. You should follow a light diet the first 24 hours after arrival home, such as soup and crackers, etc.  Be sure to include lots of fluids daily.  Resume your normal diet the day after surgery. °4.Most patients will experience some swelling and bruising around the umbilicus or in the groin and scrotum.  Ice packs and reclining will help.  Swelling and bruising can take several days to resolve.  °6. It is common to experience some constipation if taking pain medication after surgery.  Increasing fluid intake and taking a stool softener (such as Colace) will usually help or prevent this problem from occurring.  A mild laxative (Milk of Magnesia or Miralax) should be taken according to package directions if there are no bowel movements after 48 hours. °7. Unless discharge instructions indicate otherwise, you may remove your bandages 24-48 hours after surgery, and you may shower at that time.  You may have steri-strips (small skin tapes) in place directly over the incision.  These strips should be left on the skin for 7-10 days.  If your surgeon used skin glue on the  incision, you may shower in 24 hours.  The glue will flake off over the next 2-3 weeks.  Any sutures or staples will be removed at the office during your follow-up visit. °8. ACTIVITIES:  You may resume regular (light) daily activities beginning the next day--such as daily self-care, walking, climbing stairs--gradually increasing activities as tolerated.  You may have sexual intercourse when it is comfortable.  Refrain from any heavy lifting or straining until approved by your doctor. ° °a.You may drive when you are no longer taking prescription pain medication, you can comfortably wear a seatbelt, and you can safely maneuver your car and apply brakes. °b.RETURN TO WORK:   °_____________________________________________ ° °9.You should see your doctor in the office for a follow-up appointment approximately 2-3 weeks after your surgery.  Make sure that you call for this appointment within a day or two after you arrive home to insure a convenient appointment time. °10.OTHER INSTRUCTIONS: __OK TO SHOWER STARTING TOMORROW °ICE PACK, TYLENOL, IBUPROFEN ALSO FOR PAIN °NO LIFTING MORE THAN 15 TO 20 POUNDS FOR 4 WEEKS_______________________ °   _____________________________________ ° °WHEN TO CALL YOUR DOCTOR: °1. Fever over 101.0 °2. Inability to urinate °3. Nausea and/or vomiting °4. Extreme swelling or bruising °5. Continued bleeding from incision. °6. Increased pain, redness, or drainage from the incision ° °The clinic staff is available to answer your questions during regular business hours.  Please don’t hesitate to call and ask to speak to one of the nurses for clinical concerns.  If you have a medical emergency, go to the   the nearest emergency room or call 911.  A surgeon from [facility] is always on call at the hospital   [address], [address], [place], [place]  [address] ?  [address] [address], , [place]   [address] [phone] ? [phone] ? FAX [phone] Web site:  [email or web address] 

## 2019-11-15 NOTE — Anesthesia Procedure Notes (Signed)
Procedure Name: Intubation Date/Time: 11/15/2019 9:46 AM Performed by: Neldon Newport, CRNA Pre-anesthesia Checklist: Timeout performed, Patient being monitored, Suction available, Emergency Drugs available and Patient identified Patient Re-evaluated:Patient Re-evaluated prior to induction Oxygen Delivery Method: Circle system utilized Preoxygenation: Pre-oxygenation with 100% oxygen Induction Type: IV induction Ventilation: Mask ventilation without difficulty Laryngoscope Size: Mac and 4 Grade View: Grade I Tube type: Oral Tube size: 7.5 mm Number of attempts: 1 Placement Confirmation: breath sounds checked- equal and bilateral,  positive ETCO2 and ETT inserted through vocal cords under direct vision Secured at: 23 cm Tube secured with: Tape Dental Injury: Teeth and Oropharynx as per pre-operative assessment

## 2019-11-16 ENCOUNTER — Encounter: Payer: Self-pay | Admitting: *Deleted

## 2019-11-16 NOTE — Anesthesia Postprocedure Evaluation (Signed)
Anesthesia Post Note  Patient: Joshua Hess  Procedure(s) Performed: LAPAROSCOPIC RIGHT INGUINAL HERNIA REPAIR WITH MESH (Right Inguinal) UMBILICAL HERNIA REPAIR (N/A Abdomen)     Patient location during evaluation: PACU Anesthesia Type: General Level of consciousness: sedated and patient cooperative Pain management: pain level controlled Vital Signs Assessment: post-procedure vital signs reviewed and stable Respiratory status: spontaneous breathing Cardiovascular status: stable Anesthetic complications: no    Last Vitals:  Vitals:   11/15/19 1217 11/15/19 1224  BP:    Pulse: 67 (!) 58  Resp: 19 13  Temp:    SpO2: 98% 100%    Last Pain:  Vitals:   11/15/19 1200  PainSc: 10-Worst pain ever                 Nolon Nations

## 2019-11-17 ENCOUNTER — Inpatient Hospital Stay (HOSPITAL_COMMUNITY)
Admission: EM | Admit: 2019-11-17 | Discharge: 2019-11-21 | DRG: 071 | Disposition: A | Discharge status: Home / Self Care | Payer: Medicare HMO | Attending: Internal Medicine | Admitting: Internal Medicine

## 2019-11-17 DIAGNOSIS — I6522 Occlusion and stenosis of left carotid artery: Secondary | ICD-10-CM | POA: Diagnosis present

## 2019-11-17 DIAGNOSIS — F05 Delirium due to known physiological condition: Secondary | ICD-10-CM | POA: Diagnosis present

## 2019-11-17 DIAGNOSIS — T4145XA Adverse effect of unspecified anesthetic, initial encounter: Secondary | ICD-10-CM | POA: Diagnosis present

## 2019-11-17 DIAGNOSIS — Z952 Presence of prosthetic heart valve: Secondary | ICD-10-CM | POA: Diagnosis not present

## 2019-11-17 DIAGNOSIS — I6523 Occlusion and stenosis of bilateral carotid arteries: Secondary | ICD-10-CM | POA: Diagnosis not present

## 2019-11-17 DIAGNOSIS — Z79899 Other long term (current) drug therapy: Secondary | ICD-10-CM

## 2019-11-17 DIAGNOSIS — R109 Unspecified abdominal pain: Secondary | ICD-10-CM | POA: Diagnosis not present

## 2019-11-17 DIAGNOSIS — Z20822 Contact with and (suspected) exposure to covid-19: Secondary | ICD-10-CM | POA: Diagnosis present

## 2019-11-17 DIAGNOSIS — Z85820 Personal history of malignant melanoma of skin: Secondary | ICD-10-CM

## 2019-11-17 DIAGNOSIS — N401 Enlarged prostate with lower urinary tract symptoms: Secondary | ICD-10-CM | POA: Diagnosis present

## 2019-11-17 DIAGNOSIS — Z8249 Family history of ischemic heart disease and other diseases of the circulatory system: Secondary | ICD-10-CM

## 2019-11-17 DIAGNOSIS — I6529 Occlusion and stenosis of unspecified carotid artery: Secondary | ICD-10-CM | POA: Diagnosis present

## 2019-11-17 DIAGNOSIS — Z87891 Personal history of nicotine dependence: Secondary | ICD-10-CM

## 2019-11-17 DIAGNOSIS — G9341 Metabolic encephalopathy: Principal | ICD-10-CM | POA: Diagnosis present

## 2019-11-17 DIAGNOSIS — K089 Disorder of teeth and supporting structures, unspecified: Secondary | ICD-10-CM | POA: Diagnosis present

## 2019-11-17 DIAGNOSIS — G9340 Encephalopathy, unspecified: Secondary | ICD-10-CM | POA: Diagnosis present

## 2019-11-17 DIAGNOSIS — Z66 Do not resuscitate: Secondary | ICD-10-CM | POA: Diagnosis not present

## 2019-11-17 DIAGNOSIS — Z7982 Long term (current) use of aspirin: Secondary | ICD-10-CM | POA: Diagnosis not present

## 2019-11-17 DIAGNOSIS — R1013 Epigastric pain: Secondary | ICD-10-CM | POA: Diagnosis present

## 2019-11-17 DIAGNOSIS — Z86718 Personal history of other venous thrombosis and embolism: Secondary | ICD-10-CM | POA: Diagnosis not present

## 2019-11-17 DIAGNOSIS — E785 Hyperlipidemia, unspecified: Secondary | ICD-10-CM | POA: Diagnosis present

## 2019-11-17 DIAGNOSIS — Z781 Physical restraint status: Secondary | ICD-10-CM

## 2019-11-17 DIAGNOSIS — N1330 Unspecified hydronephrosis: Secondary | ICD-10-CM | POA: Diagnosis not present

## 2019-11-17 DIAGNOSIS — R351 Nocturia: Secondary | ICD-10-CM | POA: Diagnosis present

## 2019-11-17 DIAGNOSIS — R339 Retention of urine, unspecified: Secondary | ICD-10-CM

## 2019-11-17 DIAGNOSIS — R338 Other retention of urine: Secondary | ICD-10-CM | POA: Diagnosis not present

## 2019-11-17 DIAGNOSIS — Z9889 Other specified postprocedural states: Secondary | ICD-10-CM | POA: Diagnosis not present

## 2019-11-17 DIAGNOSIS — K5900 Constipation, unspecified: Secondary | ICD-10-CM | POA: Diagnosis present

## 2019-11-17 LAB — CBC
HCT: 43.3 % (ref 39.0–52.0)
Hemoglobin: 15.2 g/dL (ref 13.0–17.0)
MCH: 33.9 pg (ref 26.0–34.0)
MCHC: 35.1 g/dL (ref 30.0–36.0)
MCV: 96.7 fL (ref 80.0–100.0)
Platelets: 182 10*3/uL (ref 150–400)
RBC: 4.48 MIL/uL (ref 4.22–5.81)
RDW: 12 % (ref 11.5–15.5)
WBC: 8.8 10*3/uL (ref 4.0–10.5)
nRBC: 0 % (ref 0.0–0.2)

## 2019-11-17 LAB — COMPREHENSIVE METABOLIC PANEL
ALT: 23 U/L (ref 0–44)
AST: 37 U/L (ref 15–41)
Albumin: 4 g/dL (ref 3.5–5.0)
Alkaline Phosphatase: 51 U/L (ref 38–126)
Anion gap: 11 (ref 5–15)
BUN: 33 mg/dL — ABNORMAL HIGH (ref 8–23)
CO2: 25 mmol/L (ref 22–32)
Calcium: 9 mg/dL (ref 8.9–10.3)
Chloride: 96 mmol/L — ABNORMAL LOW (ref 98–111)
Creatinine, Ser: 1.14 mg/dL (ref 0.61–1.24)
GFR calc Af Amer: >60 mL/min (ref >60)
GFR calc non Af Amer: >60 mL/min (ref >60)
Glucose, Bld: 104 mg/dL — ABNORMAL HIGH (ref 70–99)
Potassium: 4.2 mmol/L (ref 3.5–5.1)
Sodium: 132 mmol/L — ABNORMAL LOW (ref 135–145)
Total Bilirubin: 1.6 mg/dL — ABNORMAL HIGH (ref 0.3–1.2)
Total Protein: 7 g/dL (ref 6.5–8.1)

## 2019-11-17 LAB — URINALYSIS, ROUTINE W REFLEX MICROSCOPIC
Bacteria, UA: NONE SEEN
Bilirubin Urine: NEGATIVE
Glucose, UA: NEGATIVE mg/dL
Ketones, ur: NEGATIVE mg/dL
Leukocytes,Ua: NEGATIVE
Nitrite: NEGATIVE
Protein, ur: NEGATIVE mg/dL
Specific Gravity, Urine: 1.016 (ref 1.005–1.030)
pH: 5 (ref 5.0–8.0)

## 2019-11-17 MED ORDER — SODIUM CHLORIDE 0.9% FLUSH: 3.0000 mL | Freq: Once | INTRAVENOUS | Status: DC

## 2019-11-17 NOTE — ED Triage Notes (Addendum)
Pt presents with report of "feeling anxious", pt unsure of how to describe how he is feeling,pt is A & O x 3, unsure of why he is here at this time. No facial droop, no drift.  Denies trauma. Pt repeating he just wants to get out of "this state". Pt does report some urinary retention.  pts husband called to assist with history.   Per pts husband pt had double hernia surgery 5/5, pt c/o abd pain with decreased intake today and no BM, pt was given Miralax every 2 hours since 4 am as well as Ducolax and Fleets enemas with minimal output. He states behavior is very unusual for patient, normally runs marathons and is very independent.

## 2019-11-18 ENCOUNTER — Observation Stay (HOSPITAL_COMMUNITY): Payer: Medicare HMO

## 2019-11-18 ENCOUNTER — Encounter (HOSPITAL_COMMUNITY): Payer: Self-pay | Admitting: Family Medicine

## 2019-11-18 ENCOUNTER — Emergency Department (HOSPITAL_COMMUNITY): Payer: Medicare HMO

## 2019-11-18 DIAGNOSIS — G9340 Encephalopathy, unspecified: Secondary | ICD-10-CM | POA: Diagnosis not present

## 2019-11-18 DIAGNOSIS — Z9889 Other specified postprocedural states: Secondary | ICD-10-CM

## 2019-11-18 DIAGNOSIS — R338 Other retention of urine: Secondary | ICD-10-CM | POA: Diagnosis present

## 2019-11-18 LAB — RESPIRATORY PANEL BY RT PCR (FLU A&B, COVID)
Influenza A by PCR: NEGATIVE
Influenza B by PCR: NEGATIVE
SARS Coronavirus 2 by RT PCR: NEGATIVE

## 2019-11-18 MED ORDER — ALPRAZOLAM 0.5 MG PO TABS
0.5000 mg | ORAL_TABLET | Freq: Every day | ORAL | Status: DC | PRN
  Administered 2019-11-19 – 2019-11-20 (×2): 0.5 mg via ORAL
  Filled 2019-11-18 (×3): qty 1

## 2019-11-18 MED ORDER — MORPHINE SULFATE (PF) 2 MG/ML IV SOLN
2.0000 mg | INTRAVENOUS | Status: DC | PRN
  Administered 2019-11-19 – 2019-11-20 (×2): 2 mg via INTRAVENOUS
  Filled 2019-11-18 (×2): qty 1

## 2019-11-18 MED ORDER — CHLORHEXIDINE GLUCONATE CLOTH 2 % EX PADS
6.0000 | MEDICATED_PAD | Freq: Every day | CUTANEOUS | Status: DC
  Administered 2019-11-18 – 2019-11-21 (×4): 6 via TOPICAL

## 2019-11-18 MED ORDER — ACETAMINOPHEN 650 MG RE SUPP: 650.0000 mg | Freq: Four times a day (QID) | RECTAL | Status: DC | PRN

## 2019-11-18 MED ORDER — SODIUM CHLORIDE 0.9 % IV BOLUS
1000.0000 mL | Freq: Once | INTRAVENOUS | Status: AC
  Administered 2019-11-18: 1000 mL via INTRAVENOUS

## 2019-11-18 MED ORDER — GADOBUTROL 1 MMOL/ML IV SOLN: 7.9000 mL | Freq: Once | INTRAVENOUS | Status: DC | PRN

## 2019-11-18 MED ORDER — TRAZODONE HCL 50 MG PO TABS
50.0000 mg | ORAL_TABLET | Freq: Every day | ORAL | Status: DC
  Administered 2019-11-18 – 2019-11-19 (×2): 50 mg via ORAL
  Administered 2019-11-20: 75 mg via ORAL
  Filled 2019-11-18: qty 2
  Filled 2019-11-18 (×2): qty 1

## 2019-11-18 MED ORDER — HYDROMORPHONE HCL 1 MG/ML IJ SOLN
0.5000 mg | Freq: Once | INTRAMUSCULAR | Status: DC
  Filled 2019-11-18: qty 1

## 2019-11-18 MED ORDER — ONDANSETRON HCL 4 MG PO TABS
4.0000 mg | ORAL_TABLET | Freq: Four times a day (QID) | ORAL | Status: DC | PRN
  Administered 2019-11-19: 4 mg via ORAL
  Filled 2019-11-18 (×2): qty 1

## 2019-11-18 MED ORDER — ACETAMINOPHEN 325 MG PO TABS: 650.0000 mg | ORAL_TABLET | Freq: Four times a day (QID) | ORAL | Status: DC | PRN

## 2019-11-18 MED ORDER — POLYETHYLENE GLYCOL 3350 17 G PO PACK: 17.0000 g | PACK | Freq: Every day | ORAL | Status: DC | PRN

## 2019-11-18 MED ORDER — ONDANSETRON HCL 4 MG/2ML IJ SOLN
4.0000 mg | Freq: Four times a day (QID) | INTRAMUSCULAR | Status: DC | PRN
  Administered 2019-11-20: 4 mg via INTRAVENOUS
  Filled 2019-11-18: qty 2

## 2019-11-18 MED ORDER — ENOXAPARIN SODIUM 40 MG/0.4ML ~~LOC~~ SOLN
40.0000 mg | SUBCUTANEOUS | Status: DC
  Administered 2019-11-18 – 2019-11-20 (×2): 40 mg via SUBCUTANEOUS
  Filled 2019-11-18 (×4): qty 0.4

## 2019-11-18 MED ORDER — ROSUVASTATIN CALCIUM 5 MG PO TABS
10.0000 mg | ORAL_TABLET | Freq: Every day | ORAL | Status: DC
  Administered 2019-11-18 – 2019-11-21 (×4): 10 mg via ORAL
  Filled 2019-11-18 (×4): qty 2

## 2019-11-18 MED ORDER — HALOPERIDOL LACTATE 5 MG/ML IJ SOLN
INTRAMUSCULAR | Status: AC
  Administered 2019-11-18: 5 mg
  Filled 2019-11-18: qty 1

## 2019-11-18 MED ORDER — BISACODYL 5 MG PO TBEC: 5.0000 mg | DELAYED_RELEASE_TABLET | Freq: Every day | ORAL | Status: DC | PRN

## 2019-11-18 MED ORDER — LORAZEPAM 2 MG/ML IJ SOLN
1.0000 mg | Freq: Once | INTRAMUSCULAR | Status: AC
  Administered 2019-11-18: 1 mg via INTRAVENOUS
  Filled 2019-11-18: qty 1

## 2019-11-18 MED ORDER — HALOPERIDOL LACTATE 5 MG/ML IJ SOLN
5.0000 mg | Freq: Four times a day (QID) | INTRAMUSCULAR | Status: DC | PRN
  Administered 2019-11-19 – 2019-11-20 (×2): 5 mg via INTRAVENOUS
  Filled 2019-11-18 (×2): qty 1

## 2019-11-18 MED ORDER — ASPIRIN EC 81 MG PO TBEC
81.0000 mg | DELAYED_RELEASE_TABLET | Freq: Every day | ORAL | Status: DC
  Administered 2019-11-18 – 2019-11-21 (×4): 81 mg via ORAL
  Filled 2019-11-18 (×4): qty 1

## 2019-11-18 MED ORDER — HYDRALAZINE HCL 20 MG/ML IJ SOLN: 5.0000 mg | INTRAMUSCULAR | Status: DC | PRN

## 2019-11-18 MED ORDER — ONDANSETRON HCL 4 MG/2ML IJ SOLN
4.0000 mg | Freq: Once | INTRAMUSCULAR | Status: DC
  Filled 2019-11-18: qty 2

## 2019-11-18 MED ORDER — HYDROCODONE-ACETAMINOPHEN 5-325 MG PO TABS
1.0000 | ORAL_TABLET | ORAL | Status: DC | PRN
  Administered 2019-11-19: 1 via ORAL
  Filled 2019-11-18: qty 1

## 2019-11-18 MED ORDER — DOCUSATE SODIUM 100 MG PO CAPS
100.0000 mg | ORAL_CAPSULE | Freq: Two times a day (BID) | ORAL | Status: DC
  Administered 2019-11-18 – 2019-11-21 (×6): 100 mg via ORAL
  Filled 2019-11-18 (×6): qty 1

## 2019-11-18 MED ORDER — STROKE: EARLY STAGES OF RECOVERY BOOK
Freq: Once | Status: AC
  Filled 2019-11-18: qty 1

## 2019-11-18 MED ORDER — CHLORHEXIDINE GLUCONATE 0.12 % MT SOLN
15.0000 mL | Freq: Two times a day (BID) | OROMUCOSAL | Status: DC
  Administered 2019-11-18 – 2019-11-21 (×7): 15 mL via OROMUCOSAL
  Filled 2019-11-18 (×7): qty 15

## 2019-11-18 MED ORDER — AMPICILLIN 500 MG PO CAPS
500.0000 mg | ORAL_CAPSULE | Freq: Three times a day (TID) | ORAL | Status: DC
  Filled 2019-11-18: qty 1

## 2019-11-18 MED ORDER — IOHEXOL 300 MG/ML  SOLN
100.0000 mL | Freq: Once | INTRAMUSCULAR | Status: AC | PRN
  Administered 2019-11-18: 100 mL via INTRAVENOUS

## 2019-11-18 MED ORDER — SODIUM CHLORIDE 0.9 % IV SOLN: INTRAVENOUS | Status: DC

## 2019-11-18 NOTE — Progress Notes (Signed)
From time patient arrived back to room until this moment, his confusion appeared to increase in intensity.  He became combative and increasingly delirious / paranoid, stating we were "keeping him prisoner".  Striking out at staff.  Security was called to de-escalate situation and he attempted to pull out urinary catheter, causing penile bleeding.  Patient was nonsensical at this time and disoriented x 4.   MD called multiple times and appropriate orders were received and instigated.  Patient's significant other was contacted as well and he was encouraged to return to hospital to assist in comforting patient.

## 2019-11-18 NOTE — H&P (Signed)
History and Physical    [name] [id number] DOB: 1941-01-12 DOA: 11/17/2019  PCP: Joshua Pretty, MD Consultants:  Joshua Hess - surgery; Joshua Hess - neurology; [facility] - cardiology; [facility] - vascular; [facility] - CT surgery Patient coming from:  Home - lives with husband, Joshua Hess; NOK: Joshua Hess [name], [phone]  Chief Complaint: AMS  HPI: Joshua Hess is a 79 y.o. male with medical history significant of failed TAVR due to thrombus on aortic valve and so had AVR instead; BPH; and recent hernia repair (5/5) presenting with AMS.  He reports dental implants and he has been scheduled in about 2-3 weeks.  He is clearly not sure why he came to the hospital today.  He acknowledges post-operative midepigastric pain.  +urinary retention.    His partner has been worried since January when he had an episode of aphasia.  He saw a neurologist and had a negative MRI.  He started [employer].  He has had some issues where he has not been as sharp as he was.  He named Joshua Hess as the president and then felt stupid after.  He woke him up Friday AM with pain about 315 and he has been up since then.  He has not had more aphasia but does have intermittent word finding difficulties.  He is fiercely independent and has run more than 200 marathons in his life.  He last ate Wednesday night after surgery, barely anything Thursday and nothing on Friday.  He was quite constipated, also not urinating.  He used multiple agents and ultimately 3 enemas with a couple of BMs, very hard.  He has 50-75% L carotid stenosis.   ED Course:  Carryover, per Dr. Myna Hess:  Acute confusion, constipation, and urinary retention after recent hernia repair, found to have AUR. Head CT with no acute findings. Surgery has cleared him in ED. Retention relieved by Foley and he had BM but remains confused, possibly related to analgesics.   Review of Systems: As per HPI; otherwise review of systems reviewed and negative.   Ambulatory Status:  Ambulates  without assistance  COVID Vaccine Status:   Complete  Past Medical History:  Diagnosis Date  . Aphasia   . BPH (benign prostatic hyperplasia)   . Cancer ([place])    behind ear- [place]  . Carotid artery stenosis   . Chronotropic incompetence   . ED (erectile dysfunction)   . Elevated PSA   . HOH (hard of hearing)   . Mild CAD   . Plantar fasciitis   . Postoperative atrial fibrillation ([place])   . Severe aortic stenosis    a. s/p tissue AVR 10/2018.  Marland Kitchen Sinus bradycardia    baseline  . Wears glasses     Past Surgical History:  Procedure Laterality Date  . AORTIC VALVE REPLACEMENT N/A 11/10/2018   Procedure: AORTIC VALVE REPLACEMENT (AVR), USING 23MM INSPIRIS;  Surgeon: Joshua Pollack, MD;  Location: [facility];  Service: Open Heart Surgery;  Laterality: N/A;  . EXTERNAL EAR SURGERY     melanoma removed  . HERNIA REPAIR Right    inguinal  . INGUINAL HERNIA REPAIR Right 11/15/2019   Procedure: LAPAROSCOPIC RIGHT INGUINAL HERNIA REPAIR WITH MESH;  Surgeon: Joshua Keens, MD;  Location: [facility];  Service: General;  Laterality: Right;  . RIGHT/LEFT HEART CATH AND CORONARY ANGIOGRAPHY N/A 09/29/2018   Procedure: RIGHT/LEFT HEART CATH AND CORONARY ANGIOGRAPHY;  Surgeon: Joshua Blanks, MD;  Location: [facility];  Service: Cardiovascular;  Laterality: N/A;  . TEE  WITHOUT CARDIOVERSION N/A 11/10/2018   Procedure: TRANSESOPHAGEAL ECHOCARDIOGRAM (TEE);  Surgeon: Joshua Pollack, MD;  Location: [facility];  Service: Open Heart Surgery;  Laterality: N/A;  . TRANSCATHETER AORTIC VALVE REPLACEMENT, TRANSFEMORAL     PROCEDURE ATTEMPTED & ABORTED   . UMBILICAL HERNIA REPAIR N/A 11/15/2019   Procedure: UMBILICAL HERNIA REPAIR;  Surgeon: Joshua Keens, MD;  Location: [facility];  Service: General;  Laterality: N/A;    Social History   Socioeconomic History  . Marital status: Married    Spouse name: Not on file  . Number of children: 0  . Years of education: college  . Highest education  level: Bachelor's degree (e.g., BA, AB, BS)  Occupational History  . Occupation: Retired-Human Resources  Tobacco Use  . Smoking status: Former Smoker    Years: 1.00    Quit date: 1970    Years since quitting: 51.3  . Smokeless tobacco: Never Used  Substance and Sexual Activity  . Alcohol use: No  . Drug use: No  . Sexual activity: Not on file  Other Topics Concern  . Not on file  Social History Narrative   Caffeine: 2 cups daily   Right-handed.   Lives with spouse.   Social Determinants of Health   Financial Resource Strain:   . Difficulty of Paying Living Expenses:   Food Insecurity:   . Worried About Charity fundraiser in the Last Year:   . Arboriculturist in the Last Year:   Transportation Needs:   . Film/video editor (Medical):   Marland Kitchen Lack of Transportation (Non-Medical):   Physical Activity:   . Days of Exercise per Week:   . Minutes of Exercise per Session:   Stress:   . Feeling of Stress :   Social Connections:   . Frequency of Communication with Friends and Family:   . Frequency of Social Gatherings with Friends and Family:   . Attends Religious Services:   . Active Member of Clubs or Organizations:   . Attends Archivist Meetings:   Marland Kitchen Marital Status:   Intimate Partner Violence:   . Fear of Current or Ex-Partner:   . Emotionally Abused:   Marland Kitchen Physically Abused:   . Sexually Abused:     No Known Allergies  Family History  Problem Relation Age of Onset  . Heart attack Mother   . Other Father        unsure of history  . Heart disease Brother   . Dementia Sister     Prior to Admission medications   Medication Sig Start Date End Date Taking? Authorizing Provider  ALPRAZolam Duanne Moron) 0.5 MG tablet Take 0.5 mg by mouth daily as needed for anxiety.    Yes [provider]  amoxicillin (AMOXIL) 500 MG capsule TAKE 4 CAPSULES (2000 mg) BY MOUTH 60 MINUTES PRIOR TO DENTAL APPOINTMENT Patient taking differently: Take 2,000 mg by mouth  See admin instructions. TAKE 4 CAPSULES (2000 mg) BY MOUTH 60 MINUTES PRIOR TO DENTAL APPOINTMENT 10/17/19  Yes Joshua Blanks, MD  Apoaequorin (PREVAGEN PO) Take 1 tablet by mouth daily.   Yes [provider]  aspirin EC 81 MG EC tablet Take 1 tablet (81 mg total) by mouth daily. 11/15/18  Yes Gold, Wayne E, PA-C  rosuvastatin (CRESTOR) 10 MG tablet Take 10 mg by mouth daily. 01/31/19  Yes [provider]  sildenafil (REVATIO) 20 MG tablet Take 20 mg by mouth daily as needed (ED).   Yes [provider]  traMADol (ULTRAM) 50 MG tablet Take 1 tablet (50 mg total) by mouth every 6 (six) hours as needed for moderate pain or severe pain. 11/15/19  Yes Joshua Keens, MD  traZODone (DESYREL) 50 MG tablet Take 50-75 mg by mouth at bedtime.    Yes [provider]  ampicillin (PRINCIPEN) 500 MG capsule Take 500 mg by mouth 3 (three) times daily.     [provider]  chlorhexidine (PERIDEX) 0.12 % solution 15 mLs by Mouth Rinse route in the morning and at bedtime. 10/24/19   [provider]  ibuprofen (ADVIL) 600 MG tablet Take 600 mg by mouth every 6 (six) hours as needed for pain. 10/24/19   [provider]    Physical Exam: Vitals:   11/18/19 0730 11/18/19 0800 11/18/19 1045 11/18/19 1048  BP: 140/73 138/71  124/77  Pulse: 70 62  63  Resp: 15 18  16   Temp:   98.6 F (37 C)   TempSrc:   Oral   SpO2: 94% 94%  98%     . General:  Appears calm and comfortable and is NAD; he is able to answer some questions appropriately but is still confused about where he is, date, etc . Eyes:  PERRL, EOMI, normal lids, iris . ENT:  grossly normal hearing, lips & tongue, mmm; appropriate dentition . Neck:  no LAD, masses or thyromegaly . Cardiovascular:  RRR, no m/r/g. No LE edema.  Marland Kitchen Respiratory:   CTA bilaterally with no wheezes/rales/rhonchi.  Normal respiratory effort. . Abdomen:  soft, NT, ND, NABS; mild serous umbilical drainage with  periumbilical ecchymoses post-operatively but wounds appear to be healing well . Skin:  no rash or induration seen on limited exam . Musculoskeletal:  grossly normal tone BUE/BLE, good ROM, no bony abnormality . Psychiatric:  Mildly confused mood and affect, speech fluent and appropriate, AOx1 . Neurologic:  CN 2-12 grossly intact with ?subtle R facial droop, moves all extremities in coordinated fashion    Radiological Exams on Admission: CT Head Wo Contrast  Result Date: 11/18/2019 CLINICAL DATA:  79 year old male with encephalopathy EXAM: CT HEAD WITHOUT CONTRAST TECHNIQUE: Contiguous axial images were obtained from the base of the skull through the vertex without intravenous contrast. COMPARISON:  Head CT dated 07/31/2019 FINDINGS: Brain: The ventricles and sulci appropriate size for patient's age. Minimal periventricular and deep white matter chronic microvascular ischemic changes. There is no acute intracranial hemorrhage. No mass effect or midline shift. No extra-axial fluid collection. Vascular: No hyperdense vessel or unexpected calcification. Skull: Normal. Negative for fracture or focal lesion. Sinuses/Orbits: No acute finding. Other: None IMPRESSION: No acute intracranial pathology. Electronically Signed   By: Anner Crete M.D.   On: 11/18/2019 03:34   CT ABDOMEN PELVIS W CONTRAST  Result Date: 11/18/2019 CLINICAL DATA:  79 year old male with abdominal pain and fever. Status post recent inguinal hernia repair. EXAM: CT ABDOMEN AND PELVIS WITH CONTRAST TECHNIQUE: Multidetector CT imaging of the abdomen and pelvis was performed using the standard protocol following bolus administration of intravenous contrast. CONTRAST:  144mL OMNIPAQUE IOHEXOL 300 MG/ML  SOLN COMPARISON:  None. FINDINGS: Lower chest: Minimal bibasilar dependent atelectasis. The visualized lung bases are otherwise clear. There is mild cardiomegaly. Coronary vascular calcification. Aortic valve replacement. There is  pneumoperitoneum in keeping with recent surgery. The amount of air may be more than expected for the postoperative. Correlation with operative date recommended. There is diffuse mesenteric haziness and edema. No free fluid. Hepatobiliary: The liver is unremarkable. No  intrahepatic biliary ductal dilatation. The gallbladder is unremarkable. Pancreas: Unremarkable. No pancreatic ductal dilatation or surrounding inflammatory changes. Spleen: Normal in size without focal abnormality. Adrenals/Urinary Tract: The adrenal glands are unremarkable. There is mild bilateral hydronephrosis. There is symmetric enhancement and excretion of contrast by both kidneys. There is mild bilateral hydroureter. The urinary bladder is mildly distended despite presence of a Foley catheter. Stomach/Bowel: There is large amount of stool throughout the colon. There is no bowel obstruction. There is a small hiatal hernia. The appendix is normal. Vascular/Lymphatic: Moderate aortoiliac atherosclerotic disease. The IVC is unremarkable. No portal venous gas. There is no adenopathy. Reproductive: The prostate gland is enlarged measuring 6 cm in transverse axial diameter. Other: There is diffuse anterior abdominal wall soft tissue emphysema as well as small pockets of air in the bilateral inguinal canal. No drainable fluid collection or abscess. Right inguinal hernia repair plug. Musculoskeletal: Degenerative changes of the spine. No acute osseous pathology. IMPRESSION: 1. Postsurgical changes of inguinal hernia repair with diffuse anterior abdominal emphysema and small pockets of air in the bilateral inguinal canal. No drainable fluid collection or abscess. 2. Pneumoperitoneum, likely postoperative. 3. Constipation. No bowel obstruction. Normal appendix. 4. Mild bilateral hydronephrosis, likely secondary to distended urinary bladder. The urinary bladder remains mildly distended despite presence of a Foley catheter. 5. Enlarged prostate gland. 6.  Aortic Atherosclerosis (ICD10-I70.0). Electronically Signed   By: Anner Crete M.D.   On: 11/18/2019 03:31    EKG: Independently reviewed.  NSR with rate 71; nonspecific ST changes with no evidence of acute ischemia; NSCSLT   Labs on Admission: I have personally reviewed the available labs and imaging studies at the time of the admission.  Pertinent labs:   Na++ 132 Glucose 104 Bili 1.6 Normal CBC UA: moderate hgb Respiratory panel PCR negative   Assessment/Plan Principal Problem:   Acute encephalopathy Active Problems:   Benign prostatic hyperplasia with nocturia   Carotid artery stenosis   S/P AVR (aortic valve replacement)   Acute urinary retention   Acute encephalopathy -Patient presenting with encephalopathy as evidenced by his confusion and disorientation -He does not have known dementia but has been having speech finding difficulties since he had an episode of aphasia in January; will obtain MRI and MRA, particularly given his known carotid stenosis -He is currently having agitated delirium, difficulty sitting still  -Evaluation thus far unremarkable other than acute urinary retention and constipation -Based on unremarkable evaluation with current ability to protect his airway, will observe for now with IVF hydration and telemetry monitoring -Post-anesthesia delirium is a consideration, particularly in conjuction with retention and constipation -50% of patients with delirium while hospitalized will be institutionalized at 6 months, and these patients have a 25% mortality at 6 months -The family would benefit from being referred to the [facility] and also provided with the [employer] website -His husband recently started him on Prevagen due to memory concerns but he does not have a prior h/o known dementia -Continue prn Xanax - does not take regularly  Acute urinary retention, h/o BPH -Patient with hernia surgery on 5/5 -Found to have significant  retention and reported difficulty voiding while at home -Foley placed -Recommend continuing foley for now  -Outpatient urology f/u for voiding trial  Recent hernia surgery -There is currently no evidence that post-op complications are contributing to his presentation -No obvious infection at this time  S/p AVR -Surgery on 11/10/18 with plan for TAVR but with mobile mass seen he required AVR  instead -Bioprosthestic valve -Needs ASA daily and SBE prophylaxis  Carotid artery stenosis -Negative MRI after acute episode of aphasia in Jan 2021 -Carotid US on 08/03/19 with 50-69% L ICA stenosis -Plan was to repeat carotids in August, continue ASA, statin -If current AMS is thought to be related to ischemia, vascular surgery consultation will be needed inpatient -MRI/MRA pending -However, delirium is not a common isolated finding in the setting of CVA and so this seems less likely the culprit  Dental issue -Needs a tooth extraction which is planned soon; he was somewhat fixated on this tooth problem and his partner reports that this is very concerning to him -He takes Amoxil pre-dental visit and has been ordered Ampicillin for post-extraction (but is not currently taking it)  HLD -Continue Crestor    Note: This patient has been tested and is negative for the novel coronavirus COVID-19.  DVT prophylaxis:  Lovenox  Code Status:  DNR - confirmed with patient Family Communication: None present; I spoke with the patient's husband by telephone at length at the time of admission. Disposition Plan:  The patient is from: home  Anticipated d/c is to: home without [facility] services  Anticipated d/c date will depend on clinical response to treatment, but possibly as early as tomorrow if he has excellent response to treatment  Patient is currently: acutely ill Consults called: PT/OT/ST Admission status:  It is my clinical opinion that referral for OBSERVATION is reasonable and necessary in this patient  based on the above information provided. The aforementioned taken together are felt to place the patient at high risk for further clinical deterioration. However it is anticipated that the patient may be medically stable for discharge from the hospital within 24 to 48 hours.    Karmen Bongo MD [facility]   How to contact the [facility] Attending or Consulting provider [place] or covering provider during after hours [place], for this patient?  1. Check the care team in [facility] and look for a) attending/consulting [facility] provider listed and b) the [facility] team listed 2. Log into [email or web address] and use [place]'s universal password to access. If you do not have the password, please contact the hospital operator. 3. Locate the [facility] provider you are looking for under [facility] and page to a number that you can be directly reached. 4. If you still have difficulty reaching the provider, please page the [facility] (Director on Call) for the Hospitalists listed on amion for assistance.   11/18/2019, 3:56 PM

## 2019-11-18 NOTE — ED Notes (Signed)
Pt does not know the day the month the year or the president  He apoligizes for making a mess

## 2019-11-18 NOTE — ED Notes (Signed)
Pt called out for help as I was taking another confused pt to green  The pt got out of bed naked with only his foley cath hanging  He reports that he got out of bed  To have a bm and used the trash can as a toilet brown  Colored diarrhea stool  Pt placed  Back on the  Stretcher all wires attached again  Foley bag emptied

## 2019-11-18 NOTE — Progress Notes (Signed)
Subjective Patient underwent lap R IHR + UHR with mesh 11/15/19 - developed constipation postop and hadn't had BM. Also reports he has been unable to empty much if any urine since surgery. He presented to ED for further evaluation. He had CT A/P which showed ~expected amount of pneumoperitoneum and changes immediately postop as well as a large distended bladder. He had foley placed in ED and dramatic improvement in all of his symptoms. Denies abdominal pain now. Denies n/v.  Recently took miralax + dulcolax and was able to have BM earlier 5/7.   Objective: Vital signs in last 24 hours: Temp:  [98.4 F (36.9 C)] 98.4 F (36.9 C) (05/07 2025) Pulse Rate:  [61-76] 76 (05/08 0245) Resp:  [18] 18 (05/07 2025) BP: (121-181)/(63-141) 156/80 (05/08 0245) SpO2:  [93 %-99 %] 96 % (05/08 0245)    Intake/Output from previous day: No intake/output data recorded. Intake/Output this shift: No intake/output data recorded.  Gen: NAD, comfortable CV: RRR Pulm: Normal work of breathing Abd: Soft, expected ecchymoses around surgical sites; not tender really anywhere including around incisions, nondistended; no rebound, no guarding Ext: SCDs in place  Lab Results: CBC  Recent Labs    11/17/19 2107  WBC 8.8  HGB 15.2  HCT 43.3  PLT 182   BMET Recent Labs    11/17/19 2107  NA 132*  K 4.2  CL 96*  CO2 25  GLUCOSE 104*  BUN 33*  CREATININE 1.14  CALCIUM 9.0   PT/INR No results for input(s): LABPROT, INR in the last 72 hours. ABG No results for input(s): PHART, HCO3 in the last 72 hours.  Invalid input(s): PCO2, PO2  Studies/Results:  Anti-infectives: Anti-infectives (From admission, onward)   None       Assessment/Plan: Patient Active Problem List   Diagnosis Date Noted  . Aphasia 08/07/2019  . S/P AVR (aortic valve replacement)   . Carotid artery stenosis   . Chronotropic incompetence   . Severe aortic stenosis   . Sinus bradycardia 08/12/2018  . Benign prostatic  hyperplasia with nocturia 11/01/2012   A/P  -AF, VS normal, WBC normal, exam is benign -Constipation + urinary retention as culprit - he is feeling much better since foley placement -Aggressive bowel regimen for next 2 weeks - daily miralax + fiber supplement such as benefiber; drink plenty of water -Leg bag for foley catheter; low dose flomax -Follow-up visit in our office early next week for void trial -ER warnings for failure   LOS: 0 days   Sharon Mt. Dema Severin, M.D. [facility] Use [email or web address] to contact on call provider

## 2019-11-18 NOTE — ED Notes (Signed)
Attempted to call report to 3W 

## 2019-11-18 NOTE — ED Notes (Addendum)
To ct

## 2019-11-18 NOTE — ED Notes (Signed)
Unsuccessful foley insert x1.

## 2019-11-18 NOTE — ED Notes (Addendum)
General surgery at   The bedside

## 2019-11-18 NOTE — Evaluation (Signed)
Physical Therapy Evaluation Patient Details Name: Joshua Hess MRN: [id number] DOB: [birth year] Today's Date: 11/18/2019   History of Present Illness  79 yo admitted with AMS, constipation and bladder distension (relieved with foley in ED). Pt s/p laproscopic inguinal and umbilical hernia repair on 5/5. PMhx: aphasia, HOH, AVR  Clinical Impression  Pt walking with spouse on arrival reporting that he does not sit well and is normally driving to different parks to walk throughout the day. Pt not oriented to time or situation, unable to state how to call 911, unable to wayfind to room and running into obstacles with RW. Pt with generally good strength and mobility with decreased cognition limiting function. Pt does not always carry a cell phone and educated pt and spouse for recommendation of some type of life alert system and practice with use as spouse reports noting confusion at times at home to the point he has placed name and number identification in pt shoes for recognition should he get lost on a walk. Pt with decreased functional mobility and will follow acutely to maximize safety and independence with pt and spouse agreeable.      Follow Up Recommendations Supervision/Assistance - 24 hour    Equipment Recommendations  None recommended by PT    Recommendations for Other Services OT consult;Speech consult     Precautions / Restrictions Precautions Precautions: None      Mobility  Bed Mobility               General bed mobility comments: pt standing and walking with spouse on arrival  Transfers Overall transfer level: Independent               General transfer comment: pt able to perform sit to stand without difficulty  Ambulation/Gait Ambulation/Gait assistance: Supervision Gait Distance (Feet): 300 Feet Assistive device: Rolling walker (2 wheeled);None Gait Pattern/deviations: Step-through pattern;Decreased stride length   Gait velocity interpretation: >2.62  ft/sec, indicative of community ambulatory General Gait Details: with use of Rw cues to step into Rw and avoid obstacles and consistently hitting objects on right. Pt unable to recall or locate room number. Walked 25' without AD with good stability with pt distracted by foley  Stairs            Wheelchair Mobility    Modified Rankin (Stroke Patients Only)       Balance Overall balance assessment: Mild deficits observed, not formally tested                                           Pertinent Vitals/Pain Pain Assessment: No/denies pain    Home Living Family/patient expects to be discharged to:: Private residence Living Arrangements: Spouse/significant other Available Help at Discharge: Family;Available 24 hours/day Type of Home: House Home Access: Stairs to enter       Home Equipment: [place] - 2 wheels;Cane - single point      Prior Function Level of Independence: Independent         Comments: walks 3-5 miles daily     Hand Dominance        Extremity/Trunk Assessment   Upper Extremity Assessment Upper Extremity Assessment: Overall WFL for tasks assessed    Lower Extremity Assessment Lower Extremity Assessment: Overall WFL for tasks assessed    Cervical / Trunk Assessment Cervical / Trunk Assessment: Normal  Communication   Communication: No difficulties  Cognition Arousal/Alertness:  Awake/alert Behavior During Therapy: Flat affect Overall Cognitive Status: Impaired/Different from baseline Area of Impairment: Orientation;Memory;Safety/judgement;Awareness                 Orientation Level: Time;Situation       Safety/Judgement: Decreased awareness of safety;Decreased awareness of deficits Awareness: Emergent   General Comments: pt not oriented to day, situation or recall of recent events. Husband providing appropriate hx and reports mild instances of confusion. Pt unable to recall foley present and running into objects  on right with gait      General Comments General comments (skin integrity, edema, etc.): pt able to static stand for 4 min while discussing cognition    Exercises     Assessment/Plan    PT Assessment Patient needs continued PT services  PT Problem List Decreased activity tolerance;Decreased cognition;Decreased balance;Decreased knowledge of use of DME       PT Treatment Interventions Balance training;Gait training;Functional mobility training;Therapeutic activities;Stair training;Patient/family education;Cognitive remediation    PT Goals (Current goals can be found in the Care Plan section)  Acute Rehab PT Goals Patient Stated Goal: return to walking in the park and making art work PT Goal Formulation: With patient/family Time For Goal Achievement: 11/25/19 Potential to Achieve Goals: Good    Frequency Min 3X/week   Barriers to discharge        Co-evaluation               AM-PAC PT "6 Clicks" Mobility  Outcome Measure Help needed turning from your back to your side while in a flat bed without using bedrails?: None Help needed moving from lying on your back to sitting on the side of a flat bed without using bedrails?: None Help needed moving to and from a bed to a chair (including a wheelchair)?: None Help needed standing up from a chair using your arms (e.g., wheelchair or bedside chair)?: A Little Help needed to walk in hospital room?: A Little Help needed climbing 3-5 steps with a railing? : A Little 6 Click Score: 21    End of Session   Activity Tolerance: Patient tolerated treatment well Patient left: Other (comment)(returned to walking in hall with husband with RW)   PT Visit Diagnosis: Other abnormalities of gait and mobility (R26.89);Other symptoms and signs involving the nervous system (R29.898)    Time: [id number] PT Time Calculation (min) (ACUTE ONLY): 27 min   Charges:   PT Evaluation $PT Eval Moderate Complexity: Colon,  PT Acute Rehabilitation Services Pager: [phone] Office: [phone]   Sandy Salaam Katricia Prehn 11/18/2019, 2:38 PM

## 2019-11-18 NOTE — Progress Notes (Signed)
Performed limited MRI Brain and unable to complete MRA Neck before patient became agitated and removed himself from the MRI scanner. Patient was premedicated with ativan prior to exam. Joshua Hess was no longer willing to remain in exam department or re-enter his bed. Security was notified.

## 2019-11-18 NOTE — ED Provider Notes (Signed)
[facility] EMERGENCY DEPARTMENT Provider Note   CSN: [id number] Arrival date & time: 11/17/19  1945     History Chief Complaint  Patient presents with  . Altered Mental Status    Joshua Hess is a 79 y.o. male.  Patient here with postoperative pain.  He underwent a laparoscopic inguinal hernia repair by Dr. Ninfa Linden 2 days ago.  His significant other reports he awoke about 3 AM yesterday with severe lower abdominal pain and constipation.  He was given multiple doses of Dulcolax and MiraLAX throughout the day today as well as some enemas.  He did have a bowel movement today that was "like cement".  He has persistent lower abdominal pain with decreased urination.  Has been taking tramadol at home for pain.  He had multiple doses of MiraLAX every 2 hours since 4 AM as well as several doses of Dulcolax and Fleet enemas.  He is been acting confused and not quite his baseline per his husband.  He is feeling anxious and feels like he cannot get comfortable.  There is severe pain across his abdomen.  He reports normally has a bowel movement every day.  Nausea but no vomiting.  No chest pain or shortness of breath.  Minimal urine output today.  No fever.  Multiple calls to the surgery office but the family decided to come here with persistent pain.  The history is provided by the patient.  Altered Mental Status Associated symptoms: abdominal pain   Associated symptoms: no fever, no headaches, no nausea, no rash, no vomiting and no weakness        Past Medical History:  Diagnosis Date  . Aphasia   . BPH (benign prostatic hyperplasia)   . Cancer ([place])    behind ear- [place]  . Carotid artery stenosis   . Chronotropic incompetence   . ED (erectile dysfunction)   . Elevated PSA   . HOH (hard of hearing)   . Mild CAD   . Plantar fasciitis   . Postoperative atrial fibrillation ([place])   . Severe aortic stenosis    a. s/p tissue AVR 10/2018.  Marland Kitchen Sinus bradycardia    baseline  . Wears glasses     Patient Active Problem List   Diagnosis Date Noted  . Aphasia 08/07/2019  . S/P AVR (aortic valve replacement)   . Carotid artery stenosis   . Chronotropic incompetence   . Severe aortic stenosis   . Sinus bradycardia 08/12/2018  . Benign prostatic hyperplasia with nocturia 11/01/2012    Past Surgical History:  Procedure Laterality Date  . AORTIC VALVE REPLACEMENT N/A 11/10/2018   Procedure: AORTIC VALVE REPLACEMENT (AVR), USING 23MM INSPIRIS;  Surgeon: Gaye Pollack, MD;  Location: [facility];  Service: Open Heart Surgery;  Laterality: N/A;  . EXTERNAL EAR SURGERY     melanoma removed  . HERNIA REPAIR Right    inguinal  . INGUINAL HERNIA REPAIR Right 11/15/2019   Procedure: LAPAROSCOPIC RIGHT INGUINAL HERNIA REPAIR WITH MESH;  Surgeon: Coralie Keens, MD;  Location: [facility];  Service: General;  Laterality: Right;  . RIGHT/LEFT HEART CATH AND CORONARY ANGIOGRAPHY N/A 09/29/2018   Procedure: RIGHT/LEFT HEART CATH AND CORONARY ANGIOGRAPHY;  Surgeon: Burnell Blanks, MD;  Location: [facility];  Service: Cardiovascular;  Laterality: N/A;  . TEE WITHOUT CARDIOVERSION N/A 11/10/2018   Procedure: TRANSESOPHAGEAL ECHOCARDIOGRAM (TEE);  Surgeon: Gaye Pollack, MD;  Location: [facility];  Service: Open Heart Surgery;  Laterality: N/A;  . TRANSCATHETER  AORTIC VALVE REPLACEMENT, TRANSFEMORAL     PROCEDURE ATTEMPTED & ABORTED   . UMBILICAL HERNIA REPAIR N/A 11/15/2019   Procedure: UMBILICAL HERNIA REPAIR;  Surgeon: Coralie Keens, MD;  Location: [facility];  Service: General;  Laterality: N/A;       Family History  Problem Relation Age of Onset  . Heart attack Mother   . Other Father        unsure of history  . Heart disease Brother   . Dementia Sister     Social History   Tobacco Use  . Smoking status: Former Smoker    Years: 1.00    Quit date: 1970    Years since quitting: 51.3  . Smokeless tobacco: Never Used  Substance Use Topics  .  Alcohol use: No  . Drug use: No    Home Medications Prior to Admission medications   Medication Sig Start Date End Date Taking? Authorizing Provider  ALPRAZolam Duanne Moron) 0.5 MG tablet Take 0.5 mg by mouth daily as needed for anxiety.     [provider]  amoxicillin (AMOXIL) 500 MG capsule TAKE 4 CAPSULES (2000 mg) BY MOUTH 60 MINUTES PRIOR TO DENTAL APPOINTMENT Patient taking differently: Take 2,000 mg by mouth See admin instructions. TAKE 4 CAPSULES (2000 mg) BY MOUTH 60 MINUTES PRIOR TO DENTAL APPOINTMENT 10/17/19   Burnell Blanks, MD  ampicillin (PRINCIPEN) 500 MG capsule Take 500 mg by mouth 3 (three) times daily.     [provider]  Apoaequorin (PREVAGEN PO) Take 1 tablet by mouth daily.    [provider]  aspirin EC 81 MG EC tablet Take 1 tablet (81 mg total) by mouth daily. 11/15/18   Gold, Wayne E, PA-C  chlorhexidine (PERIDEX) 0.12 % solution 15 mLs by Mouth Rinse route in the morning and at bedtime. 10/24/19   [provider]  ibuprofen (ADVIL) 600 MG tablet Take 600 mg by mouth every 6 (six) hours as needed for pain. 10/24/19   [provider]  rosuvastatin (CRESTOR) 10 MG tablet Take 10 mg by mouth daily. 01/31/19   [provider]  sildenafil (REVATIO) 20 MG tablet Take 20 mg by mouth daily as needed (ED).    [provider]  traMADol (ULTRAM) 50 MG tablet Take 1 tablet (50 mg total) by mouth every 6 (six) hours as needed for moderate pain or severe pain. 11/15/19   Coralie Keens, MD  traZODone (DESYREL) 50 MG tablet Take 50-75 mg by mouth at bedtime.     [provider]    Allergies    Patient has no known allergies.  Review of Systems   Review of Systems  Constitutional: Positive for activity change and appetite change. Negative for fever.  HENT: Negative for congestion and rhinorrhea.   Respiratory: Negative for cough and shortness of breath.   Cardiovascular: Negative for chest pain.    Gastrointestinal: Positive for abdominal pain and constipation. Negative for nausea and vomiting.  Genitourinary: Positive for decreased urine volume. Negative for dysuria and hematuria.  Musculoskeletal: Negative for arthralgias and myalgias.  Skin: Negative for rash.  Neurological: Negative for dizziness, weakness and headaches.    all other systems are negative except as noted in the HPI and PMH.   Physical Exam Updated Vital Signs BP (!) 129/107 (BP Location: Left Arm)   Pulse 69   Temp 98.4 F (36.9 C) (Oral)   Resp 18   SpO2 95%   Physical Exam Vitals and nursing note reviewed.  Constitutional:  General: He is not in acute distress.    Appearance: He is well-developed.     Comments: Uncomfortable  HENT:     Head: Normocephalic and atraumatic.     Mouth/Throat:     Pharynx: No oropharyngeal exudate.  Eyes:     Conjunctiva/sclera: Conjunctivae normal.     Pupils: Pupils are equal, round, and reactive to light.  Neck:     Comments: No meningismus. Cardiovascular:     Rate and Rhythm: Normal rate and regular rhythm.     Heart sounds: Normal heart sounds. No murmur.  Pulmonary:     Effort: Pulmonary effort is normal. No respiratory distress.     Breath sounds: Normal breath sounds.  Abdominal:     Palpations: Abdomen is soft.     Tenderness: There is abdominal tenderness. There is no guarding or rebound.     Comments: Ecchymosis around the umbilicus.  Diffuse abdominal tenderness with voluntary guarding  Genitourinary:    Comments: Testicles nontender  No fecal impaction on rectal exam. Musculoskeletal:        General: No tenderness. Normal range of motion.     Cervical back: Normal range of motion and neck supple.  Skin:    General: Skin is warm.     Capillary Refill: Capillary refill takes less than 2 seconds.  Neurological:     General: No focal deficit present.     Mental Status: He is alert and oriented to person, place, and time. Mental status is at  baseline.     Cranial Nerves: No cranial nerve deficit.     Motor: No abnormal muscle tone.     Coordination: Coordination normal.     Comments:  5/5 strength throughout. CN 2-12 intact.Equal grip strength.   Psychiatric:        Behavior: Behavior normal.     ED Results / Procedures / Treatments   Labs (all labs ordered are listed, but only abnormal results are displayed) Labs Reviewed  COMPREHENSIVE METABOLIC PANEL - Abnormal; Notable for the following components:      Result Value   Sodium 132 (*)    Chloride 96 (*)    Glucose, Bld 104 (*)    BUN 33 (*)    Total Bilirubin 1.6 (*)    All other components within normal limits  URINALYSIS, ROUTINE W REFLEX MICROSCOPIC - Abnormal; Notable for the following components:   Hgb urine dipstick MODERATE (*)    All other components within normal limits  RESPIRATORY PANEL BY RT PCR (FLU A&B, COVID)  CBC  CBG MONITORING, ED    EKG EKG Interpretation  Date/Time:  Friday Nov 17 2019 21:19:22 EDT Ventricular Rate:  71 PR Interval:  166 QRS Duration: 98 QT Interval:  396 QTC Calculation: 430 R Axis:   -26 Text Interpretation: Sinus rhythm with sinus arrhythmia with occasional Premature ventricular complexes Inferior infarct , age undetermined Possible Anterior infarct , age undetermined ST & T wave abnormality, consider lateral ischemia Abnormal ECG No significant change was found Confirmed by Ezequiel Essex [phone]) on 11/18/2019 12:00:49 AM   Radiology CT Head Wo Contrast  Result Date: 11/18/2019 CLINICAL DATA:  79 year old male with encephalopathy EXAM: CT HEAD WITHOUT CONTRAST TECHNIQUE: Contiguous axial images were obtained from the base of the skull through the vertex without intravenous contrast. COMPARISON:  Head CT dated 07/31/2019 FINDINGS: Brain: The ventricles and sulci appropriate size for patient's age. Minimal periventricular and deep white matter chronic microvascular ischemic changes. There is no acute intracranial  hemorrhage. No mass effect or midline shift. No extra-axial fluid collection. Vascular: No hyperdense vessel or unexpected calcification. Skull: Normal. Negative for fracture or focal lesion. Sinuses/Orbits: No acute finding. Other: None IMPRESSION: No acute intracranial pathology. Electronically Signed   By: Anner Crete M.D.   On: 11/18/2019 03:34   CT ABDOMEN PELVIS W CONTRAST  Result Date: 11/18/2019 CLINICAL DATA:  79 year old male with abdominal pain and fever. Status post recent inguinal hernia repair. EXAM: CT ABDOMEN AND PELVIS WITH CONTRAST TECHNIQUE: Multidetector CT imaging of the abdomen and pelvis was performed using the standard protocol following bolus administration of intravenous contrast. CONTRAST:  150mL OMNIPAQUE IOHEXOL 300 MG/ML  SOLN COMPARISON:  None. FINDINGS: Lower chest: Minimal bibasilar dependent atelectasis. The visualized lung bases are otherwise clear. There is mild cardiomegaly. Coronary vascular calcification. Aortic valve replacement. There is pneumoperitoneum in keeping with recent surgery. The amount of air may be more than expected for the postoperative. Correlation with operative date recommended. There is diffuse mesenteric haziness and edema. No free fluid. Hepatobiliary: The liver is unremarkable. No intrahepatic biliary ductal dilatation. The gallbladder is unremarkable. Pancreas: Unremarkable. No pancreatic ductal dilatation or surrounding inflammatory changes. Spleen: Normal in size without focal abnormality. Adrenals/Urinary Tract: The adrenal glands are unremarkable. There is mild bilateral hydronephrosis. There is symmetric enhancement and excretion of contrast by both kidneys. There is mild bilateral hydroureter. The urinary bladder is mildly distended despite presence of a Foley catheter. Stomach/Bowel: There is large amount of stool throughout the colon. There is no bowel obstruction. There is a small hiatal hernia. The appendix is normal.  Vascular/Lymphatic: Moderate aortoiliac atherosclerotic disease. The IVC is unremarkable. No portal venous gas. There is no adenopathy. Reproductive: The prostate gland is enlarged measuring 6 cm in transverse axial diameter. Other: There is diffuse anterior abdominal wall soft tissue emphysema as well as small pockets of air in the bilateral inguinal canal. No drainable fluid collection or abscess. Right inguinal hernia repair plug. Musculoskeletal: Degenerative changes of the spine. No acute osseous pathology. IMPRESSION: 1. Postsurgical changes of inguinal hernia repair with diffuse anterior abdominal emphysema and small pockets of air in the bilateral inguinal canal. No drainable fluid collection or abscess. 2. Pneumoperitoneum, likely postoperative. 3. Constipation. No bowel obstruction. Normal appendix. 4. Mild bilateral hydronephrosis, likely secondary to distended urinary bladder. The urinary bladder remains mildly distended despite presence of a Foley catheter. 5. Enlarged prostate gland. 6. Aortic Atherosclerosis (ICD10-I70.0). Electronically Signed   By: Anner Crete M.D.   On: 11/18/2019 03:31    Procedures Procedures (including critical care time)  Medications Ordered in ED Medications  sodium chloride flush (NS) 0.9 % injection 3 mL (has no administration in time range)  HYDROmorphone (DILAUDID) injection 0.5 mg (has no administration in time range)  ondansetron (ZOFRAN) injection 4 mg (has no administration in time range)  sodium chloride 0.9 % bolus 1,000 mL (has no administration in time range)    ED Course  I have reviewed the triage vital signs and the nursing notes.  Pertinent labs & imaging results that were available during my care of the patient were reviewed by me and considered in my medical decision making (see chart for details).    MDM Rules/Calculators/A&P                     Patient here with postoperative lower abdominal pain, constipation and decreased  urination.  He is afebrile.  Vitals are stable.  He appears uncomfortable and has difficulty sitting  still.  We will check bladder scan with concern for urinary retention.  There is no fecal impaction.  Bladder scan with greater than 700 cc of urine.  Will place Foley catheter  CT scan is reassuring.  Does show postoperative changes as well as constipation.  There is a scattered amount of pneumoperitoneum as expected.  Results discussed with Dr. Dema Severin of general surgery who reviewed case and agrees.  He did see patient.  He recommends keeping Foley catheter and starting Flomax and bowel regimen.  On reassessment, patient is confused.  He is oriented to person and place.  He believes the president however is Shon Millet.  He does not know the month or the year.  This is not his baseline per his family at bedside.  Consider possibly due to pain medication tramadol.  Most of patient's issues seem to be from urinary retention and constipation.  Normal postoperative findings as above discussed with Dr. Dema Severin.  Patient remains confused and not at his baseline per family in the room.  This may be due to pain medication or other postoperative state.  He also has not had sleep in 24 hours.  He will be admitted for observation given his ongoing altered mental status.  Discussed with Dr. Myna Hidalgo.  Final Clinical Impression(s) / ED Diagnoses Final diagnoses:  Urinary retention  Encephalopathy    Rx / DC Orders ED Discharge Orders    None       Terran Klinke, Annie Main, MD 11/18/19 [phone]

## 2019-11-19 DIAGNOSIS — Z7982 Long term (current) use of aspirin: Secondary | ICD-10-CM | POA: Diagnosis not present

## 2019-11-19 DIAGNOSIS — Z781 Physical restraint status: Secondary | ICD-10-CM | POA: Diagnosis not present

## 2019-11-19 DIAGNOSIS — K089 Disorder of teeth and supporting structures, unspecified: Secondary | ICD-10-CM | POA: Diagnosis present

## 2019-11-19 DIAGNOSIS — Z79899 Other long term (current) drug therapy: Secondary | ICD-10-CM | POA: Diagnosis not present

## 2019-11-19 DIAGNOSIS — Z66 Do not resuscitate: Secondary | ICD-10-CM | POA: Diagnosis present

## 2019-11-19 DIAGNOSIS — N1330 Unspecified hydronephrosis: Secondary | ICD-10-CM | POA: Diagnosis present

## 2019-11-19 DIAGNOSIS — G9340 Encephalopathy, unspecified: Secondary | ICD-10-CM | POA: Diagnosis present

## 2019-11-19 DIAGNOSIS — R1013 Epigastric pain: Secondary | ICD-10-CM | POA: Diagnosis present

## 2019-11-19 DIAGNOSIS — I6523 Occlusion and stenosis of bilateral carotid arteries: Secondary | ICD-10-CM | POA: Diagnosis not present

## 2019-11-19 DIAGNOSIS — K5900 Constipation, unspecified: Secondary | ICD-10-CM | POA: Diagnosis present

## 2019-11-19 DIAGNOSIS — Z87891 Personal history of nicotine dependence: Secondary | ICD-10-CM | POA: Diagnosis not present

## 2019-11-19 DIAGNOSIS — Z9889 Other specified postprocedural states: Secondary | ICD-10-CM | POA: Diagnosis not present

## 2019-11-19 DIAGNOSIS — G9341 Metabolic encephalopathy: Secondary | ICD-10-CM | POA: Diagnosis present

## 2019-11-19 DIAGNOSIS — F05 Delirium due to known physiological condition: Secondary | ICD-10-CM | POA: Diagnosis present

## 2019-11-19 DIAGNOSIS — N401 Enlarged prostate with lower urinary tract symptoms: Secondary | ICD-10-CM | POA: Diagnosis present

## 2019-11-19 DIAGNOSIS — Z952 Presence of prosthetic heart valve: Secondary | ICD-10-CM | POA: Diagnosis not present

## 2019-11-19 DIAGNOSIS — R338 Other retention of urine: Secondary | ICD-10-CM

## 2019-11-19 DIAGNOSIS — Z8249 Family history of ischemic heart disease and other diseases of the circulatory system: Secondary | ICD-10-CM | POA: Diagnosis not present

## 2019-11-19 DIAGNOSIS — Z20822 Contact with and (suspected) exposure to covid-19: Secondary | ICD-10-CM | POA: Diagnosis present

## 2019-11-19 DIAGNOSIS — Z86718 Personal history of other venous thrombosis and embolism: Secondary | ICD-10-CM | POA: Diagnosis not present

## 2019-11-19 DIAGNOSIS — E785 Hyperlipidemia, unspecified: Secondary | ICD-10-CM | POA: Diagnosis present

## 2019-11-19 DIAGNOSIS — Z85820 Personal history of malignant melanoma of skin: Secondary | ICD-10-CM | POA: Diagnosis not present

## 2019-11-19 DIAGNOSIS — T4145XA Adverse effect of unspecified anesthetic, initial encounter: Secondary | ICD-10-CM | POA: Diagnosis present

## 2019-11-19 DIAGNOSIS — I6522 Occlusion and stenosis of left carotid artery: Secondary | ICD-10-CM | POA: Diagnosis present

## 2019-11-19 LAB — LIPID PANEL
Cholesterol: 85 mg/dL (ref 0–200)
HDL: 39 mg/dL — ABNORMAL LOW (ref >40)
LDL Cholesterol: 36 mg/dL (ref 0–99)
Total CHOL/HDL Ratio: 2.2 RATIO
Triglycerides: 51 mg/dL (ref <150)
VLDL: 10 mg/dL (ref 0–40)

## 2019-11-19 LAB — HEMOGLOBIN A1C
Hgb A1c MFr Bld: 5.3 % (ref 4.8–5.6)
Mean Plasma Glucose: 105.41 mg/dL

## 2019-11-19 LAB — RAPID URINE DRUG SCREEN, HOSP PERFORMED
Amphetamines: NOT DETECTED
Barbiturates: NOT DETECTED
Benzodiazepines: NOT DETECTED
Cocaine: NOT DETECTED
Opiates: NOT DETECTED
Tetrahydrocannabinol: NOT DETECTED

## 2019-11-19 MED ORDER — ENSURE ENLIVE PO LIQD
237.0000 mL | Freq: Two times a day (BID) | ORAL | Status: DC
  Administered 2019-11-20 – 2019-11-21 (×3): 237 mL via ORAL

## 2019-11-19 MED ORDER — POLYETHYLENE GLYCOL 3350 17 G PO PACK
17.0000 g | PACK | Freq: Every day | ORAL | Status: DC
  Administered 2019-11-20 – 2019-11-21 (×2): 17 g via ORAL
  Filled 2019-11-19 (×3): qty 1

## 2019-11-19 MED ORDER — ADULT MULTIVITAMIN W/MINERALS CH
1.0000 | ORAL_TABLET | Freq: Every day | ORAL | Status: DC
  Administered 2019-11-19 – 2019-11-21 (×3): 1 via ORAL
  Filled 2019-11-19 (×3): qty 1

## 2019-11-19 NOTE — Progress Notes (Signed)
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  Patient was complaining of midepigastric pain had constipation and was found to have urinary retention.  Patient became confused.  Likely postop delirium.    Subjective   Patient seen and examined, he is much more clear this morning.  Alert and oriented x3.  Wants soft restraints off.   Assessment/Plan:     1. Acute encephalopathy-patient presented with confusion disorientation likely postop delirium.  This was likely exacerbated from constipation and urinary retention.  Patient had 2 large BMs before coming to hospital  after patient has been gave him fleets enema along with MiraLAX.  CT head is negative.  MRI brain also negative.  Patient found to have urinary retention, Foley catheter was inserted.  Patient's mental status has significantly improved this morning.  Will remove soft restraints and monitor. 2. Urinary retention-likely due to BPH, recent anesthesia for surgery and constipation.  Patient has Foley catheter in place.  Would consider removing Foley catheter in a.m. for voiding trial.  If patient unable to void will need to reinsert Foley catheter and would call urology for follow-up as outpatient. 3. Constipation-resolved, continue MiraLAX 17 g daily. 4. Hyperlipidemia-continue Crestor 5. Recent hernia surgery-stable 6. Carotid artery stenosis-carotid ultrasound on 08/03/2019 with 50 to 69% left ICA stenosis.  Plan was to repeat carotid ultrasound in August.  Continue aspirin and statin.    SpO2: 97 %   COVID-19 Labs  No results for input(s): DDIMER, FERRITIN, LDH, CRP in the last 72 hours.  Lab Results  Component Value Date   SARSCOV2NAA NEGATIVE 11/18/2019    [place] NEGATIVE 11/11/2019   [place] NEGATIVE 11/04/2018     CBG: No results for input(s): GLUCAP in the last 168 hours.  CBC: Recent Labs  Lab 11/17/19 2107  WBC 8.8  HGB 15.2  HCT 43.3  MCV 96.7  PLT [id number]    Basic Metabolic Panel: Recent Labs  Lab 11/17/19 2107  NA 132*  K 4.2  CL 96*  CO2 25  GLUCOSE 104*  BUN 33*  CREATININE 1.14  CALCIUM 9.0     Liver Function Tests: Recent Labs  Lab 11/17/19 2107  AST 37  ALT 23  ALKPHOS 51  BILITOT 1.6*  PROT 7.0  ALBUMIN 4.0        DVT prophylaxis: Lovenox  Code Status: DNR  Family Communication: Discussed with patient's husband at bedside  Disposition Plan:   Status is: Inpatient  Dispo: The patient is from: Home              Anticipated d/c is to: Home              Anticipated d/c date is: 11/20/2019              Patient currently admitted with altered mental status.  Likely postop delirium.  Significantly improved.  Will remove soft restraints and observe overnight.  If no events overnight likely discharge home in a.m.          Scheduled medications:  . aspirin EC  81 mg Oral Daily  . chlorhexidine  15 mL Mouth Rinse BID  . Chlorhexidine Gluconate Cloth  6 each Topical Daily  . docusate sodium  100 mg Oral  BID  . enoxaparin (LOVENOX) injection  40 mg Subcutaneous Q24H  . rosuvastatin  10 mg Oral Daily  . traZODone  50-75 mg Oral QHS    Consultants:    Procedures:    Antibiotics:   Anti-infectives (From admission, onward)   Start     Dose/Rate Route Frequency Ordered Stop   11/18/19 1000  ampicillin (PRINCIPEN) capsule 500 mg  Status:  Discontinued     500 mg Oral 3 times daily 11/18/19 0903 11/18/19 0907       Objective   Vitals:   11/18/19 2327 11/19/19 0316 11/19/19 0838 11/19/19 1251  BP: (!) 161/74 (!) 147/80 130/67 123/72  Pulse: 78 64 97 60  Resp: 20 20 16 16   Temp: 98.9 F (37.2 C) 98.3 F (36.8 C) 97.9 F (36.6 C) 98 F (36.7 C)  TempSrc: Oral Oral  Oral Oral  SpO2: 97% 99% 96% 97%  Weight:      Height:        Intake/Output Summary (Last 24 hours) at 11/19/2019 1528 Last data filed at 11/18/2019 2330 Gross per 24 hour  Intake 180 ml  Output 1750 ml  Net -1570 ml    05/07 1901 - 05/09 0700 In: 663.3 [P.O.:480; I.V.:183.3] Out: 4300 [Urine:4300]  Filed Weights   11/18/19 1048  Weight: 78 kg    Physical Examination:    General-appears in no acute distress  Heart-S1-S2, regular, no murmur auscultated  Lungs-clear to auscultation bilaterally, no wheezing or crackles auscultated  Abdomen-soft, nontender, no organomegaly  Extremities-no edema in the lower extremities  Neuro-alert, oriented x3, no focal deficit noted    Data Reviewed:   Recent Results (from the past 240 hour(s))  SARS CORONAVIRUS 2 (TAT 6-24 HRS) Nasopharyngeal Nasopharyngeal Swab     Status: None   Collection Time: 11/11/19  2:03 PM   Specimen: Nasopharyngeal Swab  Result Value Ref Range Status   SARS Coronavirus 2 NEGATIVE NEGATIVE Final    Comment: (NOTE) SARS-CoV-2 target nucleic acids are NOT DETECTED. The SARS-CoV-2 RNA is generally detectable in upper and lower respiratory specimens during the acute phase of infection. Negative results do not preclude SARS-CoV-2 infection, do not rule out co-infections with other pathogens, and should not be used as the sole basis for treatment or other patient management decisions. Negative results must be combined with clinical observations, patient history, and epidemiological information. The expected result is Negative. Fact Sheet for Patients: SugarRoll.be Fact Sheet for Healthcare Providers: https://www.woods-mathews.com/ This test is not yet approved or cleared by the [place] FDA and  has been authorized for detection and/or diagnosis of SARS-CoV-2 by FDA under an Emergency Use Authorization (EUA). This EUA will remain  in effect (meaning this test  can be used) for the duration of the COVID-19 declaration under Section 56 4(b)(1) of the Act, 21 U.S.C. section 360bbb-3(b)(1), unless the authorization is terminated or revoked sooner. Performed at [facility], [place] [address] [place], [place] [address]   Respiratory Panel by RT PCR (Flu A&B, Covid) - Nasopharyngeal Swab     Status: None   Collection Time: 11/18/19  7:58 AM   Specimen: Nasopharyngeal Swab  Result Value Ref Range Status   SARS Coronavirus 2 by RT PCR NEGATIVE NEGATIVE Final    Comment: (NOTE) SARS-CoV-2 target nucleic acids are NOT DETECTED. The SARS-CoV-2 RNA is generally detectable in upper respiratoy specimens during the acute phase of infection. The lowest concentration of SARS-CoV-2 viral copies this assay can detect is 131 copies/mL. A  negative result does not preclude SARS-Cov-2 infection and should not be used as the sole basis for treatment or other patient management decisions. A negative result may occur with  improper specimen collection/handling, submission of specimen other than nasopharyngeal swab, presence of viral mutation(s) within the areas targeted by this assay, and inadequate number of viral copies (<131 copies/mL). A negative result must be combined with clinical observations, patient history, and epidemiological information. The expected result is Negative. Fact Sheet for Patients:  PinkCheek.be Fact Sheet for Healthcare Providers:  GravelBags.it This test is not yet ap proved or cleared by the [place] FDA and  has been authorized for detection and/or diagnosis of SARS-CoV-2 by FDA under an Emergency Use Authorization (EUA). This EUA will remain  in effect (meaning this test can be used) for the duration of the COVID-19 declaration under Section 564(b)(1) of the Act, 21 U.S.C. section 360bbb-3(b)(1), unless the authorization is terminated or revoked sooner.     Influenza A by PCR NEGATIVE NEGATIVE Final   Influenza B by PCR NEGATIVE NEGATIVE Final    Comment: (NOTE) The Xpert Xpress SARS-CoV-2/FLU/RSV assay is intended as an aid in  the diagnosis of influenza from Nasopharyngeal swab specimens and  should not be used as a sole basis for treatment. Nasal washings and  aspirates are unacceptable for Xpert Xpress SARS-CoV-2/FLU/RSV  testing. Fact Sheet for Patients: PinkCheek.be Fact Sheet for Healthcare Providers: GravelBags.it This test is not yet approved or cleared by the [place] FDA and  has been authorized for detection and/or diagnosis of SARS-CoV-2 by  FDA under an Emergency Use Authorization (EUA). This EUA will remain  in effect (meaning this test can be used) for the duration of the  Covid-19 declaration under Section 564(b)(1) of the Act, 21  U.S.C. section 360bbb-3(b)(1), unless the authorization is  terminated or revoked. Performed at [facility], [place] [address] [place], [place] [address]      Studies:  CT Head Wo Contrast  Result Date: 11/18/2019 CLINICAL DATA:  78 year old male with encephalopathy EXAM: CT HEAD WITHOUT CONTRAST TECHNIQUE: Contiguous axial images were obtained from the base of the skull through the vertex without intravenous contrast. COMPARISON:  Head CT dated 07/31/2019 FINDINGS: Brain: The ventricles and sulci appropriate size for patient's age. Minimal periventricular and deep white matter chronic microvascular ischemic changes. There is no acute intracranial hemorrhage. No mass effect or midline shift. No extra-axial fluid collection. Vascular: No hyperdense vessel or unexpected calcification. Skull: Normal. Negative for fracture or focal lesion. Sinuses/Orbits: No acute finding. Other: None IMPRESSION: No acute intracranial pathology. Electronically Signed   By: Anner Crete M.D.   On: 11/18/2019 03:34   MR BRAIN WO CONTRAST  Result  Date: 11/18/2019 CLINICAL DATA:  Encephalopathy EXAM: MRI HEAD WITHOUT CONTRAST TECHNIQUE: Multiplanar, multiecho pulse sequences of the brain and surrounding structures were obtained without intravenous contrast. COMPARISON:  Head CT from earlier today FINDINGS: Brain: No acute infarction, hemorrhage, hydrocephalus, extra-axial collection or mass lesion. Mild white matter disease with mainly periventricular FLAIR hyperintensity. Brain volume is normal. Vascular: Normal flow voids. Skull and upper cervical spine: Normal marrow signal. C3-4 degenerative disc narrowing. Sinuses/Orbits: Negative Other: Motion degraded (T2, FLAIR, and gradient) and truncated study. Axial T1 and coronal imaging was not acquired. IMPRESSION: 1. Unremarkable brain MRI for age. 2. Motion degraded and truncated due to patient condition. Electronically Signed   By: Monte Fantasia M.D.   On: 11/18/2019 18:29   CT ABDOMEN PELVIS W CONTRAST  Result  Date: 11/18/2019 CLINICAL DATA:  79 year old male with abdominal pain and fever. Status post recent inguinal hernia repair. EXAM: CT ABDOMEN AND PELVIS WITH CONTRAST TECHNIQUE: Multidetector CT imaging of the abdomen and pelvis was performed using the standard protocol following bolus administration of intravenous contrast. CONTRAST:  11mL OMNIPAQUE IOHEXOL 300 MG/ML  SOLN COMPARISON:  None. FINDINGS: Lower chest: Minimal bibasilar dependent atelectasis. The visualized lung bases are otherwise clear. There is mild cardiomegaly. Coronary vascular calcification. Aortic valve replacement. There is pneumoperitoneum in keeping with recent surgery. The amount of air may be more than expected for the postoperative. Correlation with operative date recommended. There is diffuse mesenteric haziness and edema. No free fluid. Hepatobiliary: The liver is unremarkable. No intrahepatic biliary ductal dilatation. The gallbladder is unremarkable. Pancreas: Unremarkable. No pancreatic ductal dilatation or  surrounding inflammatory changes. Spleen: Normal in size without focal abnormality. Adrenals/Urinary Tract: The adrenal glands are unremarkable. There is mild bilateral hydronephrosis. There is symmetric enhancement and excretion of contrast by both kidneys. There is mild bilateral hydroureter. The urinary bladder is mildly distended despite presence of a Foley catheter. Stomach/Bowel: There is large amount of stool throughout the colon. There is no bowel obstruction. There is a small hiatal hernia. The appendix is normal. Vascular/Lymphatic: Moderate aortoiliac atherosclerotic disease. The IVC is unremarkable. No portal venous gas. There is no adenopathy. Reproductive: The prostate gland is enlarged measuring 6 cm in transverse axial diameter. Other: There is diffuse anterior abdominal wall soft tissue emphysema as well as small pockets of air in the bilateral inguinal canal. No drainable fluid collection or abscess. Right inguinal hernia repair plug. Musculoskeletal: Degenerative changes of the spine. No acute osseous pathology. IMPRESSION: 1. Postsurgical changes of inguinal hernia repair with diffuse anterior abdominal emphysema and small pockets of air in the bilateral inguinal canal. No drainable fluid collection or abscess. 2. Pneumoperitoneum, likely postoperative. 3. Constipation. No bowel obstruction. Normal appendix. 4. Mild bilateral hydronephrosis, likely secondary to distended urinary bladder. The urinary bladder remains mildly distended despite presence of a Foley catheter. 5. Enlarged prostate gland. 6. Aortic Atherosclerosis (ICD10-I70.0). Electronically Signed   By: Anner Crete M.D.   On: 11/18/2019 03:31       [place]   [facility] If 7PM-7AM, please contact night-coverage at [email or web address], Office  [phone]   11/19/2019, 3:28 PM  LOS: 0 days

## 2019-11-19 NOTE — Progress Notes (Signed)
OT Cancellation Note  Patient Details Name: Joshua Hess MRN: [id number] DOB: 1940/12/19   Cancelled Treatment:    Reason Eval/Treat Not Completed: Other (comment)(Pt extremely irritable and pt removing self from MRI machine this am due to delirium. Pt would greatly benefit from OT skilled services at another time when pt is more apt to working with therapy. OT to continue to follow.  Jefferey Pica, OTR/L Acute Rehabilitation Services Pager: [phone] Office: [phone]   Gerrick Ray C 11/19/2019, 11:15 AM

## 2019-11-19 NOTE — Progress Notes (Signed)
Patient has nausea, unrelieved by zofran. Paged Dr. Darrick Meigs

## 2019-11-19 NOTE — Progress Notes (Signed)
Initial Nutrition Assessment  **RD working remotely**  DOCUMENTATION CODES:   Not applicable  INTERVENTION:  Ensure Enlive po BID, each supplement provides 350 kcal and 20 grams of protein  MVI daily  NUTRITION DIAGNOSIS:   Inadequate oral intake related to decreased appetite as evidenced by meal completion < 50%.    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  Patient was complaining of midepigastric pain and had constipation and was found to have urinary retention.  Spoke with one of pt's family members over the phone. Per the family member, the pt ate 3 meals per day PTA. For breakfast, he would have oatmeal. For lunch, he would eat cottage cheese and fruit. For dinner, he typically had fish, sweet potatoes, and vegetables. Pt does not eat beef or pork.   Per wt readings, pt's wt appears stable over the last year.   PO Intake: 0-50% x 2 recorded meals  UOP: 4,353ml x24 hours I/O: -3,636.17ml since admit  Labs: Na 132 (L) Medications reviewed and include: Colace, Miralax  NUTRITION - FOCUSED PHYSICAL EXAM:  RD unable to perform at this time, working remotely.   Diet Order:   Diet Order            Diet Heart Room service appropriate? No; Fluid consistency: Thin  Diet effective ____              EDUCATION NEEDS:   Not appropriate for education at this time  Skin:  Skin Assessment: Skin Integrity Issues: Skin Integrity Issues:: Incisions Incisions: abdomen  Last BM:  11/18/19  Height:   Ht Readings from Last 1 Encounters:  11/18/19 5\' 9"  (1.753 m)    Weight:   Wt Readings from  Last 10 Encounters:  11/18/19 78 kg  11/15/19 79.9 kg  11/08/19 79.9 kg  11/02/19 80.1 kg  10/12/19 80.3 kg  08/24/19 80.3 kg  08/07/19 81.4 kg  07/31/19 79.8 kg  03/23/19 78.9 kg  03/10/19 78.8 kg    BMI:  Body mass index is 25.39 kg/m.  Estimated Nutritional Needs:   Kcal:  1900-2100  Protein:  95-105 grams  Fluid:  >/= 1.9 L/d    Larkin Ina, MS, RD, LDN RD pager number and weekend/on-call pager number located in [place].

## 2019-11-20 DIAGNOSIS — G9340 Encephalopathy, unspecified: Secondary | ICD-10-CM

## 2019-11-20 LAB — CBC
HCT: 41.8 % (ref 39.0–52.0)
Hemoglobin: 14.7 g/dL (ref 13.0–17.0)
MCH: 34 pg (ref 26.0–34.0)
MCHC: 35.2 g/dL (ref 30.0–36.0)
MCV: 96.8 fL (ref 80.0–100.0)
Platelets: 169 10*3/uL (ref 150–400)
RBC: 4.32 MIL/uL (ref 4.22–5.81)
RDW: 11.9 % (ref 11.5–15.5)
WBC: 9.2 10*3/uL (ref 4.0–10.5)
nRBC: 0 % (ref 0.0–0.2)

## 2019-11-20 LAB — BASIC METABOLIC PANEL
Anion gap: 9 (ref 5–15)
BUN: 17 mg/dL (ref 8–23)
CO2: 28 mmol/L (ref 22–32)
Calcium: 8.7 mg/dL — ABNORMAL LOW (ref 8.9–10.3)
Chloride: 103 mmol/L (ref 98–111)
Creatinine, Ser: 0.93 mg/dL (ref 0.61–1.24)
GFR calc Af Amer: >60 mL/min (ref >60)
GFR calc non Af Amer: >60 mL/min (ref >60)
Glucose, Bld: 93 mg/dL (ref 70–99)
Potassium: 3.4 mmol/L — ABNORMAL LOW (ref 3.5–5.1)
Sodium: 140 mmol/L (ref 135–145)

## 2019-11-20 MED ORDER — POTASSIUM CHLORIDE CRYS ER 20 MEQ PO TBCR
40.0000 meq | EXTENDED_RELEASE_TABLET | Freq: Once | ORAL | Status: AC
  Administered 2019-11-20: 40 meq via ORAL
  Filled 2019-11-20: qty 2

## 2019-11-20 MED ORDER — LIDOCAINE HCL URETHRAL/MUCOSAL 2 % EX GEL
1.0000 "application " | Freq: Once | CUTANEOUS | Status: AC
  Administered 2019-11-20: 1 via TOPICAL
  Filled 2019-11-20: qty 6

## 2019-11-20 NOTE — Progress Notes (Signed)
SLP Cancellation Note  Patient Details Name: Joshua Hess MRN: [id number] DOB: May 18, 1941   Cancelled treatment:       Reason Eval/Treat Not Completed: SLP screened, no needs identified, will sign off. MRI negative. AMS due to delirium/metabolic process. No need for SLP intervention. Will defer evaluation.    Sheera Illingworth, Katherene Ponto 11/20/2019, 8:05 AM

## 2019-11-20 NOTE — Evaluation (Signed)
Occupational Therapy Evaluation Patient Details Name: Joshua Hess MRN: [id number] DOB: Dec 14, 1940 Today's Date: 11/20/2019    History of Present Illness 79 yo admitted with AMS, constipation and bladder distension (relieved with foley in ED). Pt s/p laproscopic inguinal and umbilical hernia repair on 5/5. PMhx: aphasia, HOH, AVR   Clinical Impression   PTA Pt independent in ADL and mobility, enjoys long walks in [place] and creating art. Today Pt (and husband) report that mind is clearing, but still not 100%. He was on his way to the bathroom with RN staff and utilizing RW (which I think he would have performed better without) min guard for ADL tasks for safety with balance and min cues for problem solving, sequencing. OT will follow acutely, and while I suspect that cognition will continue to improve, at this time recommending OPOT to work with Pt/Spouse about cognitive strategies/compensatory methods as cognition improves.  Interesting note: Pt has run over 200 marathons!    Follow Up Recommendations  Outpatient OT;Supervision - Intermittent    Equipment Recommendations  None recommended by OT    Recommendations for Other Services       Precautions / Restrictions Precautions Precautions: None Restrictions Weight Bearing Restrictions: No      Mobility Bed Mobility               General bed mobility comments: up out of bed with RN staff in bathroom, in recliner at [place] - do not anticipate issues  Transfers Overall transfer level: Modified independent               General transfer comment: pt able to perform sit to stand without difficulty    Balance Overall balance assessment: Mild deficits observed, not formally tested                                         ADL either performed or assessed with clinical judgement   ADL Overall ADL's : Needs assistance/impaired Eating/Feeding: Supervision/ safety;Sitting   Grooming: Wash/dry  hands;Wash/dry face;Oral care;Minimal assistance;Cueing for sequencing;Standing Grooming Details (indicate cue type and reason): sink level, cues for problem solving and sequencing Upper Body Bathing: Min guard;Sitting Upper Body Bathing Details (indicate cue type and reason): educated on sitting for safety initially Lower Body Bathing: Min guard;Sitting/lateral leans   Upper Body Dressing : Modified independent;Sitting   Lower Body Dressing: Min guard;Sit to/from stand   Toilet Transfer: Min guard;Ambulation;RW Toilet Transfer Details (indicate cue type and reason): already using RW with RN staff Toileting- Clothing Manipulation and Hygiene: Sit to/from stand;Min guard Toileting - Clothing Manipulation Details (indicate cue type and reason): able to perform peri care in standing with warm wash cloth, use of grab bar for balance     Functional mobility during ADLs: Min guard;Cueing for safety;Rolling walker(he was already using RW with RN staff, he likely doesnt need) General ADL Comments: Pt reports that he is slowly feeling more like himself. Husband entered at end of session, confirming improvement     Vision Baseline Vision/History: Wears glasses Wears Glasses: At all times Patient Visual Report: No change from baseline Vision Assessment?: No apparent visual deficits     Perception     Praxis      Pertinent Vitals/Pain Pain Assessment: Faces Faces Pain Scale: Hurts a little bit Pain Location: penis - catheter Pain Descriptors / Indicators: Discomfort Pain Intervention(s): Monitored during session;Repositioned  Hand Dominance Right   Extremity/Trunk Assessment Upper Extremity Assessment Upper Extremity Assessment: Overall WFL for tasks assessed   Lower Extremity Assessment Lower Extremity Assessment: Defer to PT evaluation   Cervical / Trunk Assessment Cervical / Trunk Assessment: Normal   Communication Communication Communication: No difficulties    Cognition Arousal/Alertness: Awake/alert Behavior During Therapy: Flat affect Overall Cognitive Status: Impaired/Different from baseline Area of Impairment: Memory;Safety/judgement;Following commands;Problem solving;Awareness                 Orientation Level: Disoriented to;Time   Memory: Decreased short-term memory Following Commands: Follows one step commands with increased time;Follows one step commands consistently Safety/Judgement: Decreased awareness of safety Awareness: Emergent Problem Solving: Slow processing;Requires verbal cues General Comments: Pt confused as to why he has to keep staying here, slow processing for tasks on command, and verbal cues for problem solving   General Comments       Exercises     Shoulder Instructions      Home Living Family/patient expects to be discharged to:: Private residence Living Arrangements: Spouse/significant other(Partner) Available Help at Discharge: Family;Available 24 hours/day Type of Home: House Home Access: Stairs to enter           [employer] Shower/Tub: Occupational psychologist: Standard     Home Equipment: Environmental consultant - 2 wheels;Cane - single point;Shower seat - built in          Prior Functioning/Environment Level of Independence: Independent        Comments: walks 3-5 miles daily; hx of running over 200 marathons        OT Problem List: Decreased cognition;Decreased safety awareness;Decreased knowledge of use of DME or AE      OT Treatment/Interventions: Cognitive remediation/compensation;Patient/family education    OT Goals(Current goals can be found in the care plan section) Acute Rehab OT Goals Patient Stated Goal: return to walking in the park and making art work OT Goal Formulation: With patient/family Time For Goal Achievement: 12/04/19 Potential to Achieve Goals: Good ADL Goals Pt Will Perform Grooming: with modified independence;standing Pt Will Transfer to Toilet: with  modified independence;ambulating Pt Will Perform Toileting - Clothing Manipulation and hygiene: with modified independence;sit to/from stand Additional ADL Goal #1: Pt will perform trailmaking task with one or less errors to simulate typical walks (meaningful occupation)  OT Frequency: Min 2X/week   Barriers to D/C:            Co-evaluation              AM-PAC OT "6 Clicks" Daily Activity     Outcome Measure Help from another person eating meals?: None Help from another person taking care of personal grooming?: A Little Help from another person toileting, which includes using toliet, bedpan, or urinal?: A Little Help from another person bathing (including washing, rinsing, drying)?: A Little Help from another person to put on and taking off regular upper body clothing?: None Help from another person to put on and taking off regular lower body clothing?: A Little 6 Click Score: 20   End of Session Equipment Utilized During Treatment: Rolling walker Nurse Communication: Mobility status  Activity Tolerance: Patient tolerated treatment well Patient left: in chair;with call bell/phone within reach;with chair alarm set;with family/visitor present  OT Visit Diagnosis: Other symptoms and signs involving cognitive function                Time: [id number] OT Time Calculation (min): 24 min Charges:  OT General Charges $OT Visit: 1  Visit OT Evaluation $OT Eval Moderate Complexity: 1 Mod OT Treatments $Self Care/Home Management : 8-22 mins  Jesse Sans OTR/L Acute Rehabilitation Services Pager: [phone] Office: [place] 11/20/2019, 12:05 PM

## 2019-11-20 NOTE — Progress Notes (Signed)
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  Patient was complaining of midepigastric pain had constipation and was found to have urinary retention.  Patient became confused.  Likely postop delirium.  11/20/19: Seen and examined.  He is somnolent but easily arousable.  No new complaints this AM.  No pain.  Urine is clearing out.   Assessment/Plan: Principal Problem:   Acute encephalopathy Active Problems:   Benign prostatic hyperplasia with nocturia   Carotid artery stenosis   S/P AVR (aortic valve replacement)   Acute urinary retention   Post-operative state   1. Acute metabolic encephalopathy-patient presented with confusion disorientation likely postop delirium.  This was likely exacerbated from constipation and urinary retention.  Patient had 2 large BMs before coming to hospital  after patient has been gave him fleets enema along with MiraLAX.  CT head is negative.  MRI brain also negative.  Patient found to have urinary retention, Foley catheter was inserted.  Patient's mental status has significantly improved this morning.   2. Urinary retention-likely due to BPH, recent anesthesia for surgery and constipation.  Patient has Foley catheter in place.  Would consider removing Foley catheter today for voiding trial.  If patient unable to void will need to reinsert Foley catheter and would call urology for follow-up as outpatient. 3. Constipation-resolved, continue MiraLAX 17 g daily. 4. Hyperlipidemia-continue Crestor 5. Recent hernia surgery-stable 6. Carotid artery stenosis-carotid ultrasound on 08/03/2019 with 50 to 69% left ICA stenosis.  Plan was to repeat carotid ultrasound in August.  Continue aspirin and statin.    Status  is: Inpatient   Dispo: The patient is from: Home               Anticipated d/c is to: Home                Anticipated d/c date is: 11/21/19               Patient currently          Objective: Vitals:   11/19/19 1609 11/19/19 2000 11/20/19 0425 11/20/19 0735  BP: 109/64 136/77 126/71 (!) 146/80  Pulse: 60 69 (!) 50 63  Resp: 16 20 18 16   Temp: 97.7 F (36.5 C) 97.6 F (36.4 C) 98.2 F (36.8 C) 98 F (36.7 C)  TempSrc: Axillary Oral Oral Oral  SpO2: 97% 96% 96% 99%  Weight:      Height:        Intake/Output Summary (Last 24 hours) at 11/20/2019 0751 Last data filed at 11/20/2019 [id number] Gross per 24 hour  Intake 1937.2 ml  Output 1700 ml  Net 237.2 ml   Filed Weights   11/18/19 1048  Weight: 78 kg    Exam:  . General: 79 y.o. year-old male well developed well nourished in no acute distress.  Alert and oriented x3. . Cardiovascular: Regular rate and rhythm with no rubs or gallops.    Marland Kitchen Respiratory: Clear to auscultation with no wheezes or rales. Poor inspiratory efforts. . Abdomen: Soft nontender nondistended with normal bowel sounds x4 quadrants. . Musculoskeletal: No lower extremity edema bilaterally. Marland Kitchen Psychiatry: Mood is appropriate for condition and setting   Data Reviewed: CBC: Recent Labs  Lab 11/17/19 2107 11/20/19 0409  WBC 8.8  9.2  HGB 15.2 14.7  HCT 43.3 41.8  MCV 96.7 96.8  PLT 182 [id number]   Basic Metabolic Panel: Recent Labs  Lab 11/17/19 2107 11/20/19 0409  NA 132* 140  K 4.2 3.4*  CL 96* 103  CO2 25 28  GLUCOSE 104* 93  BUN 33* 17  CREATININE 1.14 0.93  CALCIUM 9.0 8.7*   GFR: Estimated Creatinine Clearance: 64.4 mL/min (by C-G formula based on SCr of 0.93 mg/dL). Liver Function Tests: Recent Labs  Lab 11/17/19 2107  AST 37  ALT 23  ALKPHOS 51  BILITOT 1.6*  PROT 7.0  ALBUMIN 4.0   No results for input(s): LIPASE, AMYLASE in the last 168 hours. No results for input(s): AMMONIA in the last 168 hours. Coagulation Profile:  No results for input(s): INR, PROTIME in the last 168 hours. Cardiac Enzymes: No results for input(s): CKTOTAL, CKMB, CKMBINDEX, TROPONINI in the last 168 hours. BNP (last 3 results) No results for input(s): PROBNP in the last 8760 hours. HbA1C: Recent Labs    11/19/19 0705  HGBA1C 5.3   CBG: No results for input(s): GLUCAP in the last 168 hours. Lipid Profile: Recent Labs    11/19/19 0705  CHOL 85  HDL 39*  LDLCALC 36  TRIG 51  CHOLHDL 2.2   Thyroid Function Tests: No results for input(s): TSH, T4TOTAL, FREET4, T3FREE, THYROIDAB in the last 72 hours. Anemia Panel: No results for input(s): VITAMINB12, FOLATE, FERRITIN, TIBC, IRON, RETICCTPCT in the last 72 hours. Urine analysis:    Component Value Date/Time   COLORURINE YELLOW 11/17/2019 2108   APPEARANCEUR CLEAR 11/17/2019 2108   LABSPEC 1.016 11/17/2019 2108   PHURINE 5.0 11/17/2019 2108   GLUCOSEU NEGATIVE 11/17/2019 2108   HGBUR MODERATE (A) 11/17/2019 2108   BILIRUBINUR NEGATIVE 11/17/2019 2108   [place] NEGATIVE 11/17/2019 2108   PROTEINUR NEGATIVE 11/17/2019 2108   NITRITE NEGATIVE 11/17/2019 2108   LEUKOCYTESUR NEGATIVE 11/17/2019 2108   Sepsis Labs: @LABRCNTIP (procalcitonin:4,lacticidven:4)  ) Recent Results (from the past 240 hour(s))  SARS CORONAVIRUS 2 (TAT 6-24 HRS) Nasopharyngeal Nasopharyngeal Swab     Status: None   Collection Time: 11/11/19  2:03 PM   Specimen: Nasopharyngeal Swab  Result Value Ref Range Status   SARS Coronavirus 2 NEGATIVE NEGATIVE Final    Comment: (NOTE) SARS-CoV-2 target nucleic acids are NOT DETECTED. The SARS-CoV-2 RNA is generally detectable in upper and lower respiratory specimens during the acute phase of infection. Negative results do not preclude SARS-CoV-2 infection, do not rule out co-infections with other pathogens, and should not be used as the sole basis for treatment or other patient management decisions. Negative results must be combined with clinical  observations, patient history, and epidemiological information. The expected result is Negative. Fact Sheet for Patients: SugarRoll.be Fact Sheet for Healthcare Providers: https://www.woods-mathews.com/ This test is not yet approved or cleared by the [place] FDA and  has been authorized for detection and/or diagnosis of SARS-CoV-2 by FDA under an Emergency Use Authorization (EUA). This EUA will remain  in effect (meaning this test can be used) for the duration of the COVID-19 declaration under Section 56 4(b)(1) of the Act, 21 U.S.C. section 360bbb-3(b)(1), unless the authorization is terminated or revoked sooner. Performed at [facility], [place] [address] [place], [place] [address]   Respiratory Panel by RT PCR (Flu A&B, Covid) - Nasopharyngeal Swab     Status: None   Collection Time: 11/18/19  7:58 AM   Specimen: Nasopharyngeal Swab  Result Value Ref Range  Status   SARS Coronavirus 2 by RT PCR NEGATIVE NEGATIVE Final    Comment: (NOTE) SARS-CoV-2 target nucleic acids are NOT DETECTED. The SARS-CoV-2 RNA is generally detectable in upper respiratoy specimens during the acute phase of infection. The lowest concentration of SARS-CoV-2 viral copies this assay can detect is 131 copies/mL. A negative result does not preclude SARS-Cov-2 infection and should not be used as the sole basis for treatment or other patient management decisions. A negative result may occur with  improper specimen collection/handling, submission of specimen other than nasopharyngeal swab, presence of viral mutation(s) within the areas targeted by this assay, and inadequate number of viral copies (<131 copies/mL). A negative result must be combined with clinical observations, patient history, and epidemiological information. The expected result is Negative. Fact Sheet for Patients:  PinkCheek.be Fact Sheet for Healthcare  Providers:  GravelBags.it This test is not yet ap proved or cleared by the [place] FDA and  has been authorized for detection and/or diagnosis of SARS-CoV-2 by FDA under an Emergency Use Authorization (EUA). This EUA will remain  in effect (meaning this test can be used) for the duration of the COVID-19 declaration under Section 564(b)(1) of the Act, 21 U.S.C. section 360bbb-3(b)(1), unless the authorization is terminated or revoked sooner.    Influenza A by PCR NEGATIVE NEGATIVE Final   Influenza B by PCR NEGATIVE NEGATIVE Final    Comment: (NOTE) The Xpert Xpress SARS-CoV-2/FLU/RSV assay is intended as an aid in  the diagnosis of influenza from Nasopharyngeal swab specimens and  should not be used as a sole basis for treatment. Nasal washings and  aspirates are unacceptable for Xpert Xpress SARS-CoV-2/FLU/RSV  testing. Fact Sheet for Patients: PinkCheek.be Fact Sheet for Healthcare Providers: GravelBags.it This test is not yet approved or cleared by the [place] FDA and  has been authorized for detection and/or diagnosis of SARS-CoV-2 by  FDA under an Emergency Use Authorization (EUA). This EUA will remain  in effect (meaning this test can be used) for the duration of the  Covid-19 declaration under Section 564(b)(1) of the Act, 21  U.S.C. section 360bbb-3(b)(1), unless the authorization is  terminated or revoked. Performed at [facility], [place] [address], [place], [place] [address]       Studies: No results found.  Scheduled Meds: . aspirin EC  81 mg Oral Daily  . chlorhexidine  15 mL Mouth Rinse BID  . Chlorhexidine Gluconate Cloth  6 each Topical Daily  . docusate sodium  100 mg Oral BID  . enoxaparin (LOVENOX) injection  40 mg Subcutaneous Q24H  . feeding supplement (ENSURE ENLIVE)  237 mL Oral BID BM  . multivitamin with minerals  1 tablet Oral Daily  .  polyethylene glycol  17 g Oral Daily  . rosuvastatin  10 mg Oral Daily  . traZODone  50-75 mg Oral QHS    Continuous Infusions: . sodium chloride Stopped (11/20/19 0445)     LOS: 1 day     Kayleen Memos, MD [facility] Pager [phone]  If 7PM-7AM, please contact night-coverage [email or web address] Password [facility] 11/20/2019, 7:51 AM

## 2019-11-20 NOTE — Progress Notes (Signed)
Pt had an order to re-insert his Coude catheter at shift change, supplies assembled and placed at bedside, 4W staff called and notified at 2030, said will come as soon as possible. Obasogie-Asidi, Jerrad Mendibles Efe

## 2019-11-20 NOTE — TOC Initial Note (Signed)
Transition of Care [facility]) - Initial/Assessment Note    Patient Details  Name: Joshua Hess MRN: [id number] Date of Birth: 15-Jun-1941  Transition of Care [facility]) CM/SW Contact:    Pollie Friar, RN Phone Number: 11/20/2019, 3:10 PM  Clinical Narrative:                 CM met with the patient and then spoke to his partner over the phone with pts permission. Recommendations are for outpatient therapy and his partner agrees. Orders placed for [facility]. Information on the AVS.  Pt states both her and his partner do the driving and he does his own medications at home. TOC following for further d/c needs.   Expected Discharge Plan: OP Rehab Barriers to Discharge: Continued Medical Work up   Patient Goals and CMS Choice     Choice offered to / list presented to : (significant other)  Expected Discharge Plan and Services Expected Discharge Plan: OP Rehab   Discharge Planning Services: CM Consult   Living arrangements for the past 2 months: Single Family Home                                      Prior Living Arrangements/Services Living arrangements for the past 2 months: Single Family Home Lives with:: Significant Other Patient language and need for interpreter reviewed:: Yes Do you feel safe going back to the place where you live?: Yes      Need for Family Participation in Patient Care: Yes (Comment) Care giver support system in place?: Yes (comment)   Criminal Activity/Legal Involvement Pertinent to Current Situation/Hospitalization: No - Comment as needed  Activities of Daily Living      Permission Sought/Granted                  Emotional Assessment Appearance:: Appears stated age Attitude/Demeanor/Rapport: Engaged Affect (typically observed): Accepting Orientation: : Oriented to Self, Oriented to Place, Oriented to  Time   Psych Involvement: No (comment)  Admission diagnosis:  Urinary retention [R33.9] Encephalopathy [G93.40] Acute  encephalopathy [G93.40] Patient Active Problem List   Diagnosis Date Noted  . Acute encephalopathy 11/18/2019  . Acute urinary retention 11/18/2019  . Post-operative state 11/18/2019  . Aphasia 08/07/2019  . S/P AVR (aortic valve replacement)   . Carotid artery stenosis   . Chronotropic incompetence   . Severe aortic stenosis   . Sinus bradycardia 08/12/2018  . Benign prostatic hyperplasia with nocturia 11/01/2012   PCP:  [name], MD Pharmacy:   [facility] [address] [place] - [address] [place] [address] Phone: [phone] Fax: [phone]  [facility], [place] - [address] [place] [address] Phone: [phone] Fax: [phone]     Social Determinants of Health (SDOH) Interventions    Readmission Risk Interventions Readmission Risk Prevention Plan 11/15/2018  Post Dischage Appt Complete  Medication Screening Complete  Transportation Screening Complete  Some recent data might be hidden

## 2019-11-20 NOTE — Progress Notes (Signed)
Patient confused thinking it was time to get up, got up and in the process pulled the foley, but still in place, some blood noted, urine tea colored, redirected the patient back to bed, gave him PRN meds, will continue to monitor.

## 2019-11-21 LAB — POTASSIUM: Potassium: 3.9 mmol/L (ref 3.5–5.1)

## 2019-11-21 MED ORDER — FINASTERIDE 5 MG PO TABS
5.0000 mg | ORAL_TABLET | Freq: Every day | ORAL | Status: DC
  Administered 2019-11-21: 5 mg via ORAL
  Filled 2019-11-21: qty 1

## 2019-11-21 MED ORDER — TAMSULOSIN HCL 0.4 MG PO CAPS: 0.4000 mg | ORAL_CAPSULE | Freq: Every day | ORAL | 0 refills | Status: DC

## 2019-11-21 MED ORDER — TAMSULOSIN HCL 0.4 MG PO CAPS
0.4000 mg | ORAL_CAPSULE | Freq: Every day | ORAL | Status: DC
  Administered 2019-11-21: 0.4 mg via ORAL
  Filled 2019-11-21: qty 1

## 2019-11-21 MED ORDER — ADULT MULTIVITAMIN W/MINERALS CH: 1.0000 | ORAL_TABLET | Freq: Every day | ORAL | 0 refills | Status: DC

## 2019-11-21 MED ORDER — ENSURE ENLIVE PO LIQD: 237.0000 mL | Freq: Two times a day (BID) | ORAL | 0 refills | Status: AC

## 2019-11-21 MED ORDER — POLYETHYLENE GLYCOL 3350 17 G PO PACK: 17.0000 g | PACK | Freq: Every day | ORAL | 0 refills | Status: AC

## 2019-11-21 MED ORDER — FINASTERIDE 5 MG PO TABS: 5.0000 mg | ORAL_TABLET | Freq: Every day | ORAL | 0 refills | Status: DC

## 2019-11-21 NOTE — Progress Notes (Signed)
Physical Therapy Treatment Patient Details Name: Joshua Hess MRN: [id number] DOB: Dec 03, 1940 Today's Date: 11/21/2019    History of Present Illness 79 yo admitted with AMS, constipation and bladder distension (relieved with foley in ED). Pt s/p laproscopic inguinal and umbilical hernia repair on 5/5. PMhx: aphasia, HOH, AVR    PT Comments    Patient is making good progress with PT.  From a mobility standpoint anticipate patient will be ready for DC home when medically ready.    Follow Up Recommendations  Supervision/Assistance - 24 hour;No PT follow up     Equipment Recommendations  None recommended by PT    Recommendations for Other Services OT consult;Speech consult     Precautions / Restrictions Restrictions Weight Bearing Restrictions: No    Mobility  Bed Mobility               General bed mobility comments: up in chair on arrival  Transfers Overall transfer level: Modified independent                  Ambulation/Gait Ambulation/Gait assistance: Modified independent (Device/Increase time)     Gait Pattern/deviations: WFL(Within Functional Limits)     General Gait Details: pt without notable gait deviations or balance impairments when completing wayfinding task; pt able to read piece of paper and ambulate   Stairs             Wheelchair Mobility    Modified Rankin (Stroke Patients Only)       Balance Overall balance assessment: No apparent balance deficits (not formally assessed)                           High level balance activites: Backward walking;Direction changes;Turns;Sudden stops;Head turns High Level Balance Comments: no balance deficits noted with challenges             Cognition Arousal/Alertness: Awake/alert Behavior During Therapy: Flat affect Overall Cognitive Status: Within Functional Limits for tasks assessed                                 General Comments: pt was provided a 8 step  path finding task and task to piece together a 3 piece puzzle . pt was able to follow every instructions. pt easily distracted by navigating the 3west unit. 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 Progress towards PT goals: Progressing toward goals    Frequency    Min 3X/week      PT Plan Current plan remains appropriate    Co-evaluation              AM-PAC PT "6 Clicks" Mobility   Outcome Measure  Help needed turning from your back to your side while in a flat bed without using bedrails?: None Help needed moving  from lying on your back to sitting on the side of a flat bed without using bedrails?: None Help needed moving to and from a bed to a chair (including a wheelchair)?: None Help needed standing up from a chair using your arms (e.g., wheelchair or bedside chair)?: None Help needed to walk in hospital room?: None Help needed climbing 3-5 steps with a railing? : A Little 6 Click Score: 23    End of Session Equipment Utilized During Treatment: Gait belt Activity Tolerance: Patient tolerated treatment well Patient left: in chair;with call bell/phone within reach;with chair alarm set;with family/visitor present Nurse Communication: Mobility status PT Visit Diagnosis: Other abnormalities of gait and mobility (R26.89);Other symptoms and signs involving the nervous system (R29.898)     Time: 1030-1102 PT Time Calculation (min) (ACUTE ONLY): 32 min  Charges:  $Gait Training: 8-22 mins                     Earney Navy,  PTA Acute Rehabilitation Services Pager: [phone] Office: [phone]     Darliss Cheney 11/21/2019, 3:40 PM

## 2019-11-21 NOTE — Progress Notes (Signed)
Occupational Therapy Treatment Patient Details Name: Joshua Hess MRN: [id number] DOB: 03/05/1941 Today's Date: 11/21/2019    History of present illness 79 yo admitted with AMS, constipation and bladder distension (relieved with foley in ED). Pt s/p laproscopic inguinal and umbilical hernia repair on 5/5. PMhx: aphasia, HOH, AVR   OT comments  Pt was able to navigate the unit in pathfinding task mod I and giggled at the task multiple times. Pt expressed "I enjoyed that" pt able to change positions without balance deficits. Spouse present and reports pt is getting closer to baseline cognition.    Follow Up Recommendations  No OT follow up    Equipment Recommendations  None recommended by OT    Recommendations for Other Services      Precautions / Restrictions         Mobility Bed Mobility               General bed mobility comments: up in chair on arrival  Transfers Overall transfer level: Modified independent                    Balance                                           ADL either performed or assessed with clinical judgement   ADL                                         General ADL Comments: discussed use of clean towels and wash clothes for bathing to decr risk for infection. spouse reports he will change the sheets frequently as well to decr risk for infection. pt enjoyed session and plans to keep photo provided for puzzle. Pt states "i will always cherish this"     Vision       Perception     Praxis      Cognition Arousal/Alertness: Awake/alert Behavior During Therapy: Flat affect Overall Cognitive Status: Within Functional Limits for tasks assessed                                 General Comments: pt was provided a 8 step path finding task and task to piece together a 3 piece puzzle . pt was able to follow every instructions. pt easily distracted by navigating the 3west unit. 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 OT Goal Formulation: With patient/family Time For Goal Achievement: 12/04/19 Potential to Achieve Goals: Good ADL Goals Pt Will Perform Grooming: with modified independence;standing Pt Will Transfer to Toilet: with modified independence;ambulating Pt Will Perform Toileting - Clothing Manipulation and hygiene: with modified independence;sit to/from stand Additional ADL Goal #1: Pt will perform trailmaking task with one or less errors to simulate typical walks (meaningful occupation)  Plan Discharge plan remains appropriate    Co-evaluation                 AM-PAC OT "6 Clicks" Daily Activity     Outcome Measure   Help from another person eating meals?: None Help from another person taking care of personal grooming?: None Help from another person toileting, which includes using toliet, bedpan, or urinal?: A Little Help from another person bathing (including washing, rinsing, drying)?: A Little Help from another person to put on and taking off regular upper body clothing?: None Help from another person to put on and taking off regular lower body clothing?: A Little 6 Click Score: 21    End of Session    OT Visit Diagnosis: Other symptoms and signs involving cognitive function   Activity Tolerance Patient tolerated treatment well   Patient Left in  chair;with call bell/phone within reach   Nurse Communication Mobility status;Precautions        Time: 1030(1030)-1102 OT Time Calculation (min): 32 min  Charges: OT General Charges $OT Visit: 1 Visit OT Treatments $Cognitive Funtion inital: Initial 15 mins   Joshua Hess, OTR/L  Acute Rehabilitation Services Pager: [phone] Office: [phone] .    Joshua Hess 11/21/2019, 1:31 PM

## 2019-11-21 NOTE — Discharge Instructions (Signed)
Benign Prostatic Hyperplasia  Benign prostatic hyperplasia (BPH) is an enlarged prostate gland that is caused by the normal aging process and not by cancer. The prostate is a walnut-sized gland that is involved in the production of semen. It is located in front of the rectum and below the bladder. The bladder stores urine and the urethra is the tube that carries the urine out of the body. The prostate may get bigger as a man gets older. An enlarged prostate can press on the urethra. This can make it harder to pass urine. The build-up of urine in the bladder can cause infection. Back pressure and infection may progress to bladder damage and kidney (renal) failure. What are the causes? This condition is part of a normal aging process. However, not all men develop problems from this condition. If the prostate enlarges away from the urethra, urine flow will not be blocked. If it enlarges toward the urethra and compresses it, there will be problems passing urine. What increases the risk? This condition is more likely to develop in men over the age of 50 years. What are the signs or symptoms? Symptoms of this condition include:  Getting up often during the night to urinate.  Needing to urinate frequently during the day.  Difficulty starting urine flow.  Decrease in size and strength of your urine stream.  Leaking (dribbling) after urinating.  Inability to pass urine. This needs immediate treatment.  Inability to completely empty your bladder.  Pain when you pass urine. This is more common if there is also an infection.  Urinary tract infection (UTI). How is this diagnosed? This condition is diagnosed based on your medical history, a physical exam, and your symptoms. Tests will also be done, such as:  A post-void bladder scan. This measures any amount of urine that may remain in your bladder after you finish urinating.  A digital rectal exam. In a rectal exam, your health care provider  checks your prostate by putting a lubricated, gloved finger into your rectum to feel the back of your prostate gland. This exam detects the size of your gland and any abnormal lumps or growths.  An exam of your urine (urinalysis).  A prostate specific antigen (PSA) screening. This is a blood test used to screen for prostate cancer.  An ultrasound. This test uses sound waves to electronically produce a picture of your prostate gland. Your health care provider may refer you to a specialist in kidney and prostate diseases (urologist). How is this treated? Once symptoms begin, your health care provider will monitor your condition (active surveillance or watchful waiting). Treatment for this condition will depend on the severity of your condition. Treatment may include:  Observation and yearly exams. This may be the only treatment needed if your condition and symptoms are mild.  Medicines to relieve your symptoms, including: ? Medicines to shrink the prostate. ? Medicines to relax the muscle of the prostate.  Surgery in severe cases. Surgery may include: ? Prostatectomy. In this procedure, the prostate tissue is removed completely through an open incision or with a laparoscope or robotics. ? Transurethral resection of the prostate (TURP). In this procedure, a tool is inserted through the opening at the tip of the penis (urethra). It is used to cut away tissue of the inner core of the prostate. The pieces are removed through the same opening of the penis. This removes the blockage. ? Transurethral incision (TUIP). In this procedure, small cuts are made in the prostate. This lessens   the prostate's pressure on the urethra. ? Transurethral microwave thermotherapy (TUMT). This procedure uses microwaves to create heat. The heat destroys and removes a small amount of prostate tissue. ? Transurethral needle ablation (TUNA). This procedure uses radio frequencies to destroy and remove a small amount of  prostate tissue. ? Interstitial laser coagulation ([facility]). This procedure uses a laser to destroy and remove a small amount of prostate tissue. ? Transurethral electrovaporization (TUVP). This procedure uses electrodes to destroy and remove a small amount of prostate tissue. ? Prostatic urethral lift. This procedure inserts an implant to push the lobes of the prostate away from the urethra. Follow these instructions at home:  Take over-the-counter and prescription medicines only as told by your health care provider.  Monitor your symptoms for any changes. Contact your health care provider with any changes.  Avoid drinking large amounts of liquid before going to bed or out in public.  Avoid or reduce how much caffeine or alcohol you drink.  Give yourself time when you urinate.  Keep all follow-up visits as told by your health care provider. This is important. Contact a health care provider if:  You have unexplained back pain.  Your symptoms do not get better with treatment.  You develop side effects from the medicine you are taking.  Your urine becomes very dark or has a bad smell.  Your lower abdomen becomes distended and you have trouble passing your urine. Get help right away if:  You have a fever or chills.  You suddenly cannot urinate.  You feel lightheaded, or very dizzy, or you faint.  There are large amounts of blood or clots in the urine.  Your urinary problems become hard to manage.  You develop moderate to severe low back or flank pain. The flank is the side of your body between the ribs and the hip. These symptoms may represent a serious problem that is an emergency. Do not wait to see if the symptoms will go away. Get medical help right away. Call your local emergency services (911 in the [place] Do not drive yourself to the hospital. Summary  Benign prostatic hyperplasia (BPH) is an enlarged prostate that is caused by the normal aging process and not by  cancer.  An enlarged prostate can press on the urethra. This can make it hard to pass urine.  This condition is part of a normal aging process and is more likely to develop in men over the age of 63 years.  Get help right away if you suddenly cannot urinate. This information is not intended to replace advice given to you by your health care provider. Make sure you discuss any questions you have with your health care provider. Document Revised: 05/24/2018 Document Reviewed: 08/03/2016 Elsevier Patient Education  2020 [place]. Acute Urinary Retention, Male  Acute urinary retention means that you cannot pee (urinate) at all, or that you pee too little and your bladder is not emptied completely. If it is not treated, it can lead to kidney damage or other serious problems. Follow these instructions at home:  Take over-the-counter and prescription medicines only as told by your doctor. Ask your doctor what medicines you should stay away from. Do not take any medicine unless your doctor says it is okay to do so.  If you were sent home with a tube that drains the bladder (catheter), take care of it as told by your doctor.  Drink enough fluid to keep your pee clear or pale yellow.  If  you were given an antibiotic, take it as told by your doctor. Do not stop taking the antibiotic even if you start to feel better.  Do not use any products that contain nicotine or tobacco, such as cigarettes and e-cigarettes. If you need help quitting, ask your doctor.  Watch for changes in your symptoms. Tell your doctor about them.  If told, track changes in your blood pressure at home. Tell your doctor about them.  Keep all follow-up visits as told by your doctor. This is important. Contact a doctor if:  You have spasms or you leak pee when you have spasms. Get help right away if:  You have chills or a fever.  You have a tube that drains the bladder and: ? The tube stops draining pee. ? The tube  falls out.  You have blood in your pee. Summary  Acute urinary retention means that you have problems peeing. It may mean that you cannot pee at all, or that you pee too little.  If this condition is not treated, it can lead to kidney damage or other serious problems.  If you were sent home with a tube that drains the bladder, take care of it as told by your doctor.  Monitor any changes in your symptoms. Tell your doctor about any changes. This information is not intended to replace advice given to you by your health care provider. Make sure you discuss any questions you have with your health care provider. Document Revised: 09/15/2018 Document Reviewed: 07/31/2016 Elsevier Patient Education  [place].  Dehydration, Adult Dehydration is condition in which there is not enough water or other fluids in the body. This happens when a person loses more fluids than he or she takes in. Important body parts cannot work right without the right amount of fluids. Any loss of fluids from the body can cause dehydration. Dehydration can be mild, worse, or very bad. It should be treated right away to keep it from getting very bad. What are the causes? This condition may be caused by:  Conditions that cause loss of water or other fluids, such as: ? Watery poop (diarrhea). ? Vomiting. ? Sweating a lot. ? Peeing (urinating) a lot.  Not drinking enough fluids, especially when you: ? Are ill. ? Are doing things that take a lot of energy to do.  Other illnesses and conditions, such as fever or infection.  Certain medicines, such as medicines that take extra fluid out of the body (diuretics).  Lack of safe drinking water.  Not being able to get enough water and food. What increases the risk? The following factors may make you more likely to develop this condition:  Having a long-term (chronic) illness that has not been treated the right way, such as: ? Diabetes. ? Heart  disease. ? Kidney disease.  Being 79 years of age or older.  Having a disability.  Living in a place that is high above the ground or sea (high in altitude). The thinner, dried air causes more fluid loss.  Doing exercises that put stress on your body for a long time. What are the signs or symptoms? Symptoms of dehydration depend on how bad it is. Mild or worse dehydration  Thirst.  Dry lips or dry mouth.  Feeling dizzy or light-headed, especially when you stand up from sitting.  Muscle cramps.  Your body making: ? Dark pee (urine). Pee may be the color of tea. ? Less pee than normal. ? Less tears than  normal.  Headache. Very bad dehydration  Changes in skin. Skin may: ? Be cold to the touch (clammy). ? Be blotchy or pale. ? Not go back to normal right after you lightly pinch it and let it go.  Little or no tears, pee, or sweat.  Changes in vital signs, such as: ? Fast breathing. ? Low blood pressure. ? Weak pulse. ? Pulse that is more than 100 beats a minute when you are sitting still.  Other changes, such as: ? Feeling very thirsty. ? Eyes that look hollow (sunken). ? Cold hands and feet. ? Being mixed up (confused). ? Being very tired (lethargic) or having trouble waking from sleep. ? Short-term weight loss. ? Loss of consciousness. How is this treated? Treatment for this condition depends on how bad it is. Treatment should start right away. Do not wait until your condition gets very bad. Very bad dehydration is an emergency. You will need to go to a hospital.  Mild or worse dehydration can be treated at home. You may be asked to: ? Drink more fluids. ? Drink an oral rehydration solution (ORS). This drink helps get the right amounts of fluids and salts and minerals in the blood (electrolytes).  Very bad dehydration can be treated: ? With fluids through an IV tube. ? By getting normal levels of salts and minerals in your blood. This is often done by giving  salts and minerals through a tube. The tube is passed through your nose and into your stomach. ? By treating the root cause. Follow these instructions at home: Oral rehydration solution If told by your doctor, drink an ORS:  Make an ORS. Use instructions on the package.  Start by drinking small amounts, about  cup (120 mL) every 5-10 minutes.  Slowly drink more until you have had the amount that your doctor said to have. Eating and drinking         Drink enough clear fluid to keep your pee pale yellow. If you were told to drink an ORS, finish the ORS first. Then, start slowly drinking other clear fluids. Drink fluids such as: ? Water. Do not drink only water. Doing that can make the salt (sodium) level in your body get too low. ? Water from ice chips you suck on. ? Fruit juice that you have added water to (diluted). ? Low-calorie sports drinks.  Eat foods that have the right amounts of salts and minerals, such as: ? Bananas. ? Oranges. ? Potatoes. ? Tomatoes. ? Spinach.  Do not drink alcohol.  Avoid: ? Drinks that have a lot of sugar. These include:  High-calorie sports drinks.  Fruit juice that you did not add water to.  Soda.  Caffeine. ? Foods that are greasy or have a lot of fat or sugar. General instructions  Take over-the-counter and prescription medicines only as told by your doctor.  Do not take salt tablets. Doing that can make the salt level in your body get too high.  Return to your normal activities as told by your doctor. Ask your doctor what activities are safe for you.  Keep all follow-up visits as told by your doctor. This is important. Contact a doctor if:  You have pain in your belly (abdomen) and the pain: ? Gets worse. ? Stays in one place.  You have a rash.  You have a stiff neck.  You get angry or annoyed (irritable) more easily than normal.  You are more tired or have a harder time  waking than normal.  You feel: ? Weak or  dizzy. ? Very thirsty. Get help right away if you have:  Any symptoms of very bad dehydration.  Symptoms of vomiting, such as: ? You cannot eat or drink without vomiting. ? Your vomiting gets worse or does not go away. ? Your vomit has blood or green stuff in it.  Symptoms that get worse with treatment.  A fever.  A very bad headache.  Problems with peeing or pooping (having a bowel movement), such as: ? Watery poop that gets worse or does not go away. ? Blood in your poop (stool). This may cause poop to look black and tarry. ? Not peeing in 6-8 hours. ? Peeing only a small amount of very dark pee in 6-8 hours.  Trouble breathing. These symptoms may be an emergency. Do not wait to see if the symptoms will go away. Get medical help right away. Call your local emergency services (911 in the [place] Do not drive yourself to the hospital. Summary  Dehydration is a condition in which there is not enough water or other fluids in the body. This happens when a person loses more fluids than he or she takes in.  Treatment for this condition depends on how bad it is. Treatment should be started right away. Do not wait until your condition gets very bad.  Drink enough clear fluid to keep your pee pale yellow. If you were told to drink an oral rehydration solution (ORS), finish the ORS first. Then, start slowly drinking other clear fluids.  Take over-the-counter and prescription medicines only as told by your doctor.  Get help right away if you have any symptoms of very bad dehydration. This information is not intended to replace advice given to you by your health care provider. Make sure you discuss any questions you have with your health care provider. Document Revised: 02/09/2019 Document Reviewed: 02/09/2019 Elsevier Patient Education  [place].

## 2019-11-21 NOTE — Progress Notes (Signed)
Patient being discharged home with outpatient rehab. Eduation and information provided to patient. IV removed. Patient going home with foley. All belongings with patient. Leaving unit via wheelchair.

## 2019-11-21 NOTE — Discharge Summary (Signed)
Discharge Summary  Joshua Hess [id number] DOB: 02/23/1941  PCP: [name], MD  Admit date: 11/17/2019 Discharge date: 11/21/2019  Time spent: 35 minutes   Recommendations for Outpatient Follow-up:  1. Follow up with urology 2. Follow up with your cardiologist 3. Follow up with your PCP  Discharge Diagnoses:  Active Hospital Problems   Diagnosis Date Noted  . Acute encephalopathy 11/18/2019  . Acute urinary retention 11/18/2019  . Post-operative state 11/18/2019  . S/P AVR (aortic valve replacement)   . Carotid artery stenosis   . Benign prostatic hyperplasia with nocturia 11/01/2012    Resolved Hospital Problems  No resolved problems to display.    Discharge Condition: Stable   Diet recommendation: Heart healthy diet   Vitals:   11/21/19 0409 11/21/19 0902  BP: (!) 141/90 91/68  Pulse: 62 69  Resp: 18 18  Temp: (!) 97.5 F (36.4 C) (!) 97.4 F (36.3 C)  SpO2: 96% 98%    History of present illness:  79 year old male with a history of failed TAVR due to thrombus on aortic valve so had AVR instead, BPH, recent hernia repair on 11/15/2019 presented with altered mental status. Patient was complaining of midepigastric pain had constipation and was found to have an acute urinary retention. Patient became confused. Likely postop delirium.  Failed a voiding trial on 11/20/19.  A foley coude was placed.  CT abd pelvis showed enlarged prostate, mild b/l hydronephrosis and distended bladder despite foley insert.  Discussed with Dr. Tresa Moore.  Patient will follow up in the office for a voiding trial.  No AKI, Cr 0.93 with GFR >60.  11/21/19: Seen and examined.  No acute events overnight.  He has no new complaints.  Eager to go home.  Will discharge home with a Foley catheter and prescriptions for tamsulosin and finasteride.  Will follow-up with urology outpatient.   Hospital Course:  Principal Problem:   Acute encephalopathy Active Problems:   Benign prostatic  hyperplasia with nocturia   Carotid artery stenosis   S/P AVR (aortic valve replacement)   Acute urinary retention   Post-operative state   Resolved Acute metabolic encephalopathy-patient presented with confusion disorientation likely postop delirium. This was likely exacerbated from constipation and acute urinary retention. Constipation has resolved.   Will discharge home with a foley catheter and will follow up with urology outpatient for a voiding trial.  CT head is negative. MRI brain also negative.  Acute Urinary retention-likely multifactorial, due to BPH,recent anesthesia from surgery and constipation. Patient has Foley catheter in place. Failed a voiding trial on 11/20/19. Resolved Constipation-resolved, continue MiraLAX 17 g daily. Hyperlipidemia-continue Crestor Recent hernia surgery-stable Carotid artery stenosis-carotid ultrasound on 08/03/2019 with 50 to 69% left ICA stenosis.   Continue aspirin and statin.    Discharge Exam: BP 91/68 (BP Location: Right Arm)   Pulse 69   Temp (!) 97.4 F (36.3 C) (Oral)   Resp 18   Ht 5\' 9"  (1.753 m)   Wt 78 kg   SpO2 98%   BMI 25.39 kg/m  . General: 79 y.o. year-old male well developed well nourished in no acute distress.  Alert and oriented x3. . Cardiovascular: Regular rate and rhythm with no rubs or gallops.  No thyromegaly or JVD noted.   Marland Kitchen Respiratory: Clear to auscultation with no wheezes or rales. Good inspiratory effort. . Abdomen: Soft nontender nondistended with normal bowel sounds x4 quadrants. . Musculoskeletal: No lower extremity edema. 2/4 pulses in all 4 extremities. Marland Kitchen Psychiatry: Mood is appropriate  for condition and setting  Discharge Instructions You were cared for by a hospitalist during your hospital stay. If you have any questions about your discharge medications or the care you received while you were in the hospital after you are discharged, you can call the unit and asked to speak with the hospitalist on  call if the hospitalist that took care of you is not available. Once you are discharged, your primary care physician will handle any further medical issues. Please note that NO REFILLS for any discharge medications will be authorized once you are discharged, as it is imperative that you return to your primary care physician (or establish a relationship with a primary care physician if you do not have one) for your aftercare needs so that they can reassess your need for medications and monitor your lab values.  Discharge Instructions    Ambulatory referral to Occupational Therapy   Complete by: As directed      Allergies as of 11/21/2019   No Known Allergies     Medication List    TAKE these medications   ALPRAZolam 0.5 MG tablet Commonly known as: XANAX Take 0.5 mg by mouth daily as needed for anxiety.   amoxicillin 500 MG capsule Commonly known as: AMOXIL TAKE 4 CAPSULES (2000 mg) BY MOUTH 60 MINUTES PRIOR TO DENTAL APPOINTMENT What changed:   how much to take  how to take this  when to take this   ampicillin 500 MG capsule Commonly known as: PRINCIPEN Take 500 mg by mouth 3 (three) times daily.   aspirin 81 MG EC tablet Take 1 tablet (81 mg total) by mouth daily.   chlorhexidine 0.12 % solution Commonly known as: PERIDEX 15 mLs by Mouth Rinse route in the morning and at bedtime.   feeding supplement (ENSURE ENLIVE) Liqd Take 237 mLs by mouth 2 (two) times daily between meals for 7 days.   finasteride 5 MG tablet Commonly known as: PROSCAR Take 1 tablet (5 mg total) by mouth daily. Start taking on: Nov 22, 2019   ibuprofen 600 MG tablet Commonly known as: ADVIL Take 600 mg by mouth every 6 (six) hours as needed for pain.   multivitamin with minerals Tabs tablet Take 1 tablet by mouth daily. Start taking on: Nov 22, 2019   polyethylene glycol 17 g packet Commonly known as: MIRALAX / GLYCOLAX Take 17 g by mouth daily. Start taking on: Nov 22, 2019   PREVAGEN  PO Take 1 tablet by mouth daily.   rosuvastatin 10 MG tablet Commonly known as: CRESTOR Take 10 mg by mouth daily.   sildenafil 20 MG tablet Commonly known as: REVATIO Take 20 mg by mouth daily as needed (ED).   tamsulosin 0.4 MG Caps capsule Commonly known as: FLOMAX Take 1 capsule (0.4 mg total) by mouth daily. Start taking on: Nov 22, 2019   traMADol 50 MG tablet Commonly known as: ULTRAM Take 1 tablet (50 mg total) by mouth every 6 (six) hours as needed for moderate pain or severe pain.   traZODone 50 MG tablet Commonly known as: DESYREL Take 50-75 mg by mouth at bedtime.      No Known Allergies Follow-up Information    [facility] Follow up.   Specialty: Rehabilitation Why: The outpatient rehab will contact you for the first appointment Contact information: [place]       Alexis Frock, MD. Call in 1 day(s).   Specialty: Urology  Why: Please call for a post hospital follow-up appointment. Contact information: [place] [address] [phone]        [name], MD. Call in 1 day(s).   Specialty: Internal Medicine Why: Please call for a post hospital follow-up appointment. Contact information: [address] [place] [address] [phone]        Burnell Blanks, MD .   Specialty: Cardiology Contact information: [place] [place] [address] [phone]            The results of significant diagnostics from this hospitalization (including imaging, microbiology, ancillary and laboratory) are listed below for reference.    Significant Diagnostic Studies: CT Head Wo Contrast  Result Date: 11/18/2019 CLINICAL DATA:  79 year old male with encephalopathy EXAM: CT HEAD WITHOUT CONTRAST TECHNIQUE: Contiguous axial images were obtained from the base of the skull  through the vertex without intravenous contrast. COMPARISON:  Head CT dated 07/31/2019 FINDINGS: Brain: The ventricles and sulci appropriate size for patient's age. Minimal periventricular and deep white matter chronic microvascular ischemic changes. There is no acute intracranial hemorrhage. No mass effect or midline shift. No extra-axial fluid collection. Vascular: No hyperdense vessel or unexpected calcification. Skull: Normal. Negative for fracture or focal lesion. Sinuses/Orbits: No acute finding. Other: None IMPRESSION: No acute intracranial pathology. Electronically Signed   By: Anner Crete M.D.   On: 11/18/2019 03:34   MR BRAIN WO CONTRAST  Result Date: 11/18/2019 CLINICAL DATA:  Encephalopathy EXAM: MRI HEAD WITHOUT CONTRAST TECHNIQUE: Multiplanar, multiecho pulse sequences of the brain and surrounding structures were obtained without intravenous contrast. COMPARISON:  Head CT from earlier today FINDINGS: Brain: No acute infarction, hemorrhage, hydrocephalus, extra-axial collection or mass lesion. Mild white matter disease with mainly periventricular FLAIR hyperintensity. Brain volume is normal. Vascular: Normal flow voids. Skull and upper cervical spine: Normal marrow signal. C3-4 degenerative disc narrowing. Sinuses/Orbits: Negative Other: Motion degraded (T2, FLAIR, and gradient) and truncated study. Axial T1 and coronal imaging was not acquired. IMPRESSION: 1. Unremarkable brain MRI for age. 2. Motion degraded and truncated due to patient condition. Electronically Signed   By: Monte Fantasia M.D.   On: 11/18/2019 18:29   CT ABDOMEN PELVIS W CONTRAST  Result Date: 11/18/2019 CLINICAL DATA:  79 year old male with abdominal pain and fever. Status post recent inguinal hernia repair. EXAM: CT ABDOMEN AND PELVIS WITH CONTRAST TECHNIQUE: Multidetector CT imaging of the abdomen and pelvis was performed using the standard protocol following bolus administration of intravenous contrast. CONTRAST:   157mL OMNIPAQUE IOHEXOL 300 MG/ML  SOLN COMPARISON:  None. FINDINGS: Lower chest: Minimal bibasilar dependent atelectasis. The visualized lung bases are otherwise clear. There is mild cardiomegaly. Coronary vascular calcification. Aortic valve replacement. There is pneumoperitoneum in keeping with recent surgery. The amount of air may be more than expected for the postoperative. Correlation with operative date recommended. There is diffuse mesenteric haziness and edema. No free fluid. Hepatobiliary: The liver is unremarkable. No intrahepatic biliary ductal dilatation. The gallbladder is unremarkable. Pancreas: Unremarkable. No pancreatic ductal dilatation or surrounding inflammatory changes. Spleen: Normal in size without focal abnormality. Adrenals/Urinary Tract: The adrenal glands are unremarkable. There is mild bilateral hydronephrosis. There is symmetric enhancement and excretion of contrast by both kidneys. There is mild bilateral hydroureter. The urinary bladder is mildly distended despite presence of a Foley catheter. Stomach/Bowel: There is large amount of stool throughout the colon. There is no bowel obstruction. There is a small hiatal hernia. The appendix is normal.  Vascular/Lymphatic: Moderate aortoiliac atherosclerotic disease. The IVC is unremarkable. No portal venous gas. There is no adenopathy. Reproductive: The prostate gland is enlarged measuring 6 cm in transverse axial diameter. Other: There is diffuse anterior abdominal wall soft tissue emphysema as well as small pockets of air in the bilateral inguinal canal. No drainable fluid collection or abscess. Right inguinal hernia repair plug. Musculoskeletal: Degenerative changes of the spine. No acute osseous pathology. IMPRESSION: 1. Postsurgical changes of inguinal hernia repair with diffuse anterior abdominal emphysema and small pockets of air in the bilateral inguinal canal. No drainable fluid collection or abscess. 2. Pneumoperitoneum, likely  postoperative. 3. Constipation. No bowel obstruction. Normal appendix. 4. Mild bilateral hydronephrosis, likely secondary to distended urinary bladder. The urinary bladder remains mildly distended despite presence of a Foley catheter. 5. Enlarged prostate gland. 6. Aortic Atherosclerosis (ICD10-I70.0). Electronically Signed   By: Anner Crete M.D.   On: 11/18/2019 03:31    Microbiology: Recent Results (from the past 240 hour(s))  SARS CORONAVIRUS 2 (TAT 6-24 HRS) Nasopharyngeal Nasopharyngeal Swab     Status: None   Collection Time: 11/11/19  2:03 PM   Specimen: Nasopharyngeal Swab  Result Value Ref Range Status   SARS Coronavirus 2 NEGATIVE NEGATIVE Final    Comment: (NOTE) SARS-CoV-2 target nucleic acids are NOT DETECTED. The SARS-CoV-2 RNA is generally detectable in upper and lower respiratory specimens during the acute phase of infection. Negative results do not preclude SARS-CoV-2 infection, do not rule out co-infections with other pathogens, and should not be used as the sole basis for treatment or other patient management decisions. Negative results must be combined with clinical observations, patient history, and epidemiological information. The expected result is Negative. Fact Sheet for Patients: SugarRoll.be Fact Sheet for Healthcare Providers: https://www.woods-mathews.com/ This test is not yet approved or cleared by the [place] FDA and  has been authorized for detection and/or diagnosis of SARS-CoV-2 by FDA under an Emergency Use Authorization (EUA). This EUA will remain  in effect (meaning this test can be used) for the duration of the COVID-19 declaration under Section 56 4(b)(1) of the Act, 21 U.S.C. section 360bbb-3(b)(1), unless the authorization is terminated or revoked sooner. Performed at [facility], [place] [address] [place], [place] [address]   Respiratory Panel by RT PCR (Flu A&B, Covid) - Nasopharyngeal  Swab     Status: None   Collection Time: 11/18/19  7:58 AM   Specimen: Nasopharyngeal Swab  Result Value Ref Range Status   SARS Coronavirus 2 by RT PCR NEGATIVE NEGATIVE Final    Comment: (NOTE) SARS-CoV-2 target nucleic acids are NOT DETECTED. The SARS-CoV-2 RNA is generally detectable in upper respiratoy specimens during the acute phase of infection. The lowest concentration of SARS-CoV-2 viral copies this assay can detect is 131 copies/mL. A negative result does not preclude SARS-Cov-2 infection and should not be used as the sole basis for treatment or other patient management decisions. A negative result may occur with  improper specimen collection/handling, submission of specimen other than nasopharyngeal swab, presence of viral mutation(s) within the areas targeted by this assay, and inadequate number of viral copies (<131 copies/mL). A negative result must be combined with clinical observations, patient history, and epidemiological information. The expected result is Negative. Fact Sheet for Patients:  PinkCheek.be Fact Sheet for Healthcare Providers:  GravelBags.it This test is not yet ap proved or cleared by the [place] FDA and  has been authorized for detection and/or diagnosis of SARS-CoV-2 by FDA under an Emergency  Use Authorization (EUA). This EUA will remain  in effect (meaning this test can be used) for the duration of the COVID-19 declaration under Section 564(b)(1) of the Act, 21 U.S.C. section 360bbb-3(b)(1), unless the authorization is terminated or revoked sooner.    Influenza A by PCR NEGATIVE NEGATIVE Final   Influenza B by PCR NEGATIVE NEGATIVE Final    Comment: (NOTE) The Xpert Xpress SARS-CoV-2/FLU/RSV assay is intended as an aid in  the diagnosis of influenza from Nasopharyngeal swab specimens and  should not be used as a sole basis for treatment. Nasal washings and  aspirates are  unacceptable for Xpert Xpress SARS-CoV-2/FLU/RSV  testing. Fact Sheet for Patients: PinkCheek.be Fact Sheet for Healthcare Providers: GravelBags.it This test is not yet approved or cleared by the [place] FDA and  has been authorized for detection and/or diagnosis of SARS-CoV-2 by  FDA under an Emergency Use Authorization (EUA). This EUA will remain  in effect (meaning this test can be used) for the duration of the  Covid-19 declaration under Section 564(b)(1) of the Act, 21  U.S.C. section 360bbb-3(b)(1), unless the authorization is  terminated or revoked. Performed at [facility], [place] [address] [place], [place] [address]      Labs: Basic Metabolic Panel: Recent Labs  Lab 11/17/19 2107 11/20/19 0409 11/21/19 0653  NA 132* 140  --   K 4.2 3.4* 3.9  CL 96* 103  --   CO2 25 28  --   GLUCOSE 104* 93  --   BUN 33* 17  --   CREATININE 1.14 0.93  --   CALCIUM 9.0 8.7*  --    Liver Function Tests: Recent Labs  Lab 11/17/19 2107  AST 37  ALT 23  ALKPHOS 51  BILITOT 1.6*  PROT 7.0  ALBUMIN 4.0   No results for input(s): LIPASE, AMYLASE in the last 168 hours. No results for input(s): AMMONIA in the last 168 hours. CBC: Recent Labs  Lab 11/17/19 2107 11/20/19 0409  WBC 8.8 9.2  HGB 15.2 14.7  HCT 43.3 41.8  MCV 96.7 96.8  PLT 182 169   Cardiac Enzymes: No results for input(s): CKTOTAL, CKMB, CKMBINDEX, TROPONINI in the last 168 hours. BNP: BNP (last 3 results) No results for input(s): BNP in the last 8760 hours.  ProBNP (last 3 results) No results for input(s): PROBNP in the last 8760 hours.  CBG: No results for input(s): GLUCAP in the last 168 hours.     Signed:  Kayleen Memos, MD [facility] 11/21/2019, 1:14 PM

## 2019-11-21 NOTE — Progress Notes (Signed)
I have reviewed and agreed above plan. 

## 2019-11-24 ENCOUNTER — Other Ambulatory Visit: Payer: Self-pay

## 2019-11-24 NOTE — Telephone Encounter (Signed)
The patient had hernia surgery and went back to hospital with constipation/urinary retention and developed confusion/delerium.  Was discharged with foley.   His dc instruction indicate f/u with cardiology.  He was seen by Dr. Angelena Form 3 weeks ago with instructions to follow up in one year.  Will route to Dr. Angelena Form for further recommendations/reply to patient's message.

## 2019-11-24 NOTE — Patient Outreach (Signed)
[facility]) Care Management  11/24/2019  Joshua Hess [birth year] [id number]    EMMI-General Discharge RED ON EMMI ALERT Day # 1 Date: 11/23/2019 Red Alert Reason: "Know who to call about changes in condition? No"   Outreach attempt # 1 to patient. Spoke with both patient and spouse. They report that things are going fairly well. Reviewed and addressed red alert. Spouse states that patient was having a little leakage around foley site and he was not sure if they needed to contact hospital or urologist. However, he did call urologist office and was told this was "normal, to be expected and a good thing."  Patient has follow up appt with PCP and urologist on next week. No issues with transportation. Spouse confirms that patient has all his meds and no issues or concerns regarding them. They deny any RN CM needs or concerns at this time. Advised patient that they would get one more automated EMMI-GENERAL post discharge calls to assess how they are doing following recent hospitalization and will receive a call from a nurse if any of their responses were abnormal. Patient voiced understanding and was appreciative of f/u call.     Plan: RN CM will close case at this time.   Enzo Montgomery, RN,BSN,CCM [facility] Management Telephonic Care Management Coordinator Direct Phone: [phone] Toll Free: [phone] Fax: [phone]

## 2019-11-27 ENCOUNTER — Other Ambulatory Visit: Payer: Self-pay

## 2019-11-27 DIAGNOSIS — F329 Major depressive disorder, single episode, unspecified: Secondary | ICD-10-CM | POA: Diagnosis not present

## 2019-11-27 DIAGNOSIS — R339 Retention of urine, unspecified: Secondary | ICD-10-CM | POA: Diagnosis not present

## 2019-11-27 DIAGNOSIS — K5903 Drug induced constipation: Secondary | ICD-10-CM | POA: Diagnosis not present

## 2019-11-27 NOTE — Patient Outreach (Signed)
[facility]) Care Management  11/27/2019  Joshua Hess [birth year] [id number]    EMMI-General Discharge RED ON EMMI ALERT Day # 4 Date: 11/26/2019 Red Alert Reason: "Sad/hopeless/anxious/empty? Yes"   Outreach attempt #1 to patient. Spoke with spouse who reports patient is doing okay. He denies any acute issues or concerns at this time. Reviewed and addressed red alert. Spouse reports that patient was a little down but back to being okay. They have several appts coming up this week with MD's which spouse will be accompanying patient on. Outpatient therapy appt has been arranged aw well. He denies any RN CM needs or concerns at this time. They have completed post discharge automated calls.      Plan: RN CM will close case at [facility] time.   Enzo Montgomery, RN,BSN,CCM [place] Management Telephonic Care Management Coordinator Direct Phone: [phone] Toll Free: [phone] Fax: [phone]

## 2019-11-29 ENCOUNTER — Ambulatory Visit: Payer: Medicare HMO | Attending: Internal Medicine | Admitting: Occupational Therapy

## 2019-11-29 ENCOUNTER — Encounter: Payer: Self-pay | Admitting: Occupational Therapy

## 2019-11-29 ENCOUNTER — Other Ambulatory Visit: Payer: Self-pay

## 2019-11-29 DIAGNOSIS — I69915 Cognitive social or emotional deficit following unspecified cerebrovascular disease: Secondary | ICD-10-CM | POA: Insufficient documentation

## 2019-11-29 NOTE — Therapy (Signed)
[facility] [address] [place], [place], [address] Phone: [phone]   Fax:  [phone]  Occupational Therapy Evaluation  Patient Details  Name: Joshua Hess MRN: [id number] Date of Birth: 1941-06-10 No data recorded  Encounter Date: 11/29/2019    Past Medical History:  Diagnosis Date  . Aphasia   . BPH (benign prostatic hyperplasia)   . Cancer ([place])    behind ear- [place]  . Carotid artery stenosis   . Chronotropic incompetence   . ED (erectile dysfunction)   . Elevated PSA   . HOH (hard of hearing)   . Mild CAD   . Plantar fasciitis   . Postoperative atrial fibrillation ([place])   . Severe aortic stenosis    a. s/p tissue AVR 10/2018.  Marland Kitchen Sinus bradycardia    baseline  . Wears glasses     Past Surgical History:  Procedure Laterality Date  . AORTIC VALVE REPLACEMENT N/A 11/10/2018   Procedure: AORTIC VALVE REPLACEMENT (AVR), USING 23MM INSPIRIS;  Surgeon: Gaye Pollack, MD;  Location: [facility];  Service: Open Heart Surgery;  Laterality: N/A;  . EXTERNAL EAR SURGERY     melanoma removed  . HERNIA REPAIR Right    inguinal  . INGUINAL HERNIA REPAIR Right 11/15/2019   Procedure: LAPAROSCOPIC RIGHT INGUINAL HERNIA REPAIR WITH MESH;  Surgeon: Coralie Keens, MD;  Location: [facility];  Service: General;  Laterality: Right;  . RIGHT/LEFT HEART CATH AND CORONARY ANGIOGRAPHY N/A 09/29/2018   Procedure: RIGHT/LEFT HEART CATH AND CORONARY ANGIOGRAPHY;  Surgeon: Burnell Blanks, MD;  Location: [facility];  Service: Cardiovascular;  Laterality: N/A;  . TEE WITHOUT CARDIOVERSION N/A 11/10/2018   Procedure: TRANSESOPHAGEAL ECHOCARDIOGRAM (TEE);  Surgeon: Gaye Pollack, MD;  Location: [facility];  Service: Open Heart Surgery;  Laterality: N/A;  . TRANSCATHETER AORTIC VALVE REPLACEMENT, TRANSFEMORAL     PROCEDURE ATTEMPTED & ABORTED   . UMBILICAL HERNIA REPAIR N/A 11/15/2019   Procedure: UMBILICAL HERNIA REPAIR;   Surgeon: Coralie Keens, MD;  Location: [facility];  Service: General;  Laterality: N/A;    There were no vitals filed for this visit.  Subjective Assessment - 11/29/19 1024    Pertinent History  Admited with altered mental status on 11/15/2019.  Pt with   PMH:    Currently in Pain?  No/denies         Pt arrived for OT evaluation - per medical chart no OT follow up was recommended. Upon discussion with pt, pt states he is having word finding deficits, issues with higher level memory, organization and rate of processing information.  Pt states he is independent in all basic ADL and basic IADL. Pt also reports he feels his balance and ambulation are at baseline.  Pt has been medically cleared to walk 1- 2 miles x3 times per day per pt's friend who attends with pt today with pt's permission. Feel pt does not need OT services but could benefit from ST eval - discussed with pt and pt's friend and pt would like to pursue ST eval. Message sent to primary MD and pt to schedule once order is obtained.  Pt and friend to follow up directly with MD. No further OT indicated at this time.                               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 Acute encephalopathy 11/18/2019  . Acute urinary retention 11/18/2019  . Post-operative state 11/18/2019  . Aphasia 08/07/2019  . S/P AVR (aortic valve replacement)   . Carotid artery stenosis   . Chronotropic incompetence   . Severe aortic stenosis   . Sinus bradycardia 08/12/2018  . Benign prostatic hyperplasia with nocturia 11/01/2012    Quay Burow, OTR/L 11/29/2019, 10:45 AM  [facility] [address] [place], [place], [address] Phone: [phone]   Fax:  [phone]  Name: Joshua Hess MRN: [id number] Date of Birth: 19-May-1941

## 2019-11-30 DIAGNOSIS — R330 Drug induced retention of urine: Secondary | ICD-10-CM | POA: Diagnosis not present

## 2019-12-01 ENCOUNTER — Emergency Department (HOSPITAL_BASED_OUTPATIENT_CLINIC_OR_DEPARTMENT_OTHER)
Admission: EM | Admit: 2019-12-01 | Discharge: 2019-12-01 | Disposition: A | Discharge status: Home / Self Care | Payer: Medicare HMO | Attending: Emergency Medicine | Admitting: Emergency Medicine

## 2019-12-01 ENCOUNTER — Other Ambulatory Visit: Payer: Self-pay

## 2019-12-01 ENCOUNTER — Encounter (HOSPITAL_BASED_OUTPATIENT_CLINIC_OR_DEPARTMENT_OTHER): Payer: Self-pay | Admitting: *Deleted

## 2019-12-01 ENCOUNTER — Other Ambulatory Visit: Payer: Self-pay | Admitting: Surgery

## 2019-12-01 DIAGNOSIS — N401 Enlarged prostate with lower urinary tract symptoms: Secondary | ICD-10-CM | POA: Insufficient documentation

## 2019-12-01 DIAGNOSIS — Z87891 Personal history of nicotine dependence: Secondary | ICD-10-CM | POA: Insufficient documentation

## 2019-12-01 DIAGNOSIS — Z9861 Coronary angioplasty status: Secondary | ICD-10-CM | POA: Diagnosis not present

## 2019-12-01 DIAGNOSIS — R339 Retention of urine, unspecified: Secondary | ICD-10-CM | POA: Insufficient documentation

## 2019-12-01 DIAGNOSIS — Z7982 Long term (current) use of aspirin: Secondary | ICD-10-CM | POA: Insufficient documentation

## 2019-12-01 DIAGNOSIS — R338 Other retention of urine: Secondary | ICD-10-CM

## 2019-12-01 DIAGNOSIS — Z79899 Other long term (current) drug therapy: Secondary | ICD-10-CM | POA: Diagnosis not present

## 2019-12-01 LAB — URINALYSIS, MICROSCOPIC (REFLEX): RBC / HPF: NONE SEEN RBC/hpf (ref 0–5)

## 2019-12-01 LAB — URINALYSIS, ROUTINE W REFLEX MICROSCOPIC
Bilirubin Urine: NEGATIVE
Glucose, UA: NEGATIVE mg/dL
Ketones, ur: NEGATIVE mg/dL
Nitrite: POSITIVE — AB
Protein, ur: NEGATIVE mg/dL
Specific Gravity, Urine: 1.015 (ref 1.005–1.030)
pH: 6.5 (ref 5.0–8.0)

## 2019-12-01 MED ORDER — CIPROFLOXACIN HCL 500 MG PO TABS
500.0000 mg | ORAL_TABLET | Freq: Once | ORAL | Status: AC
  Administered 2019-12-01: 500 mg via ORAL
  Filled 2019-12-01: qty 1

## 2019-12-01 MED ORDER — LIDOCAINE HCL URETHRAL/MUCOSAL 2 % EX GEL
CUTANEOUS | Status: AC
  Filled 2019-12-01: qty 20

## 2019-12-01 MED ORDER — CIPROFLOXACIN HCL 500 MG PO TABS
500.0000 mg | ORAL_TABLET | Freq: Two times a day (BID) | ORAL | 0 refills | Status: DC
Start: 2019-12-01 — End: 2019-12-13

## 2019-12-01 NOTE — ED Notes (Signed)
Leg bag and bedside drainage supplies given to caregiver.

## 2019-12-01 NOTE — ED Provider Notes (Signed)
[facility] DEPT [facility] Provider Note: Georgena Spurling, MD, FACEP  CSN: [id number] MRN: [id number] ARRIVAL: 12/01/19 at [place]: [place]  Urinary Retention   HISTORY OF PRESENT ILLNESS  12/01/19 1:05 AM Joshua Hess is a 79 y.o. male who had a Foley catheter placed recently for urinary retention.  The catheter was removed by urology yesterday.  He has now been unable to urinate for 6 hours and is having severe discomfort in his bladder which she describes as a pressure.  It is worse with palpation or movement.  He has not had a fever.  Bedside bladder scan showed approximately 732 mL retained urine.   Past Medical History:  Diagnosis Date  . Aphasia   . BPH (benign prostatic hyperplasia)   . 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 Carotid artery stenosis   . Chronotropic incompetence   . ED (erectile dysfunction)   . Elevated PSA   . HOH (hard of hearing)   . Mild CAD   . Plantar fasciitis   . Postoperative atrial fibrillation ([place])   . Severe aortic stenosis    a. s/p tissue AVR 10/2018.  Marland Kitchen Sinus bradycardia    baseline  . Wears glasses     Past Surgical History:  Procedure Laterality Date  . AORTIC VALVE REPLACEMENT N/A 11/10/2018   Procedure: AORTIC VALVE REPLACEMENT (AVR), USING 23MM INSPIRIS;  Surgeon: Gaye Pollack, MD;  Location: [facility];  Service: Open Heart Surgery;  Laterality: N/A;  . EXTERNAL EAR SURGERY     melanoma removed  . HERNIA REPAIR Right    inguinal  . INGUINAL HERNIA REPAIR Right 11/15/2019   Procedure: LAPAROSCOPIC RIGHT INGUINAL HERNIA REPAIR WITH MESH;  Surgeon: Coralie Keens, MD;  Location: [facility];  Service: General;  Laterality: Right;  . RIGHT/LEFT HEART CATH AND CORONARY ANGIOGRAPHY N/A 09/29/2018   Procedure: RIGHT/LEFT HEART CATH AND CORONARY ANGIOGRAPHY;  Surgeon: Burnell Blanks, MD;  Location: [facility];  Service: Cardiovascular;  Laterality: N/A;  . TEE WITHOUT CARDIOVERSION N/A 11/10/2018   Procedure: TRANSESOPHAGEAL ECHOCARDIOGRAM (TEE);  Surgeon: Gaye Pollack, MD;  Location: [facility];  Service: Open Heart Surgery;  Laterality: N/A;  . TRANSCATHETER AORTIC VALVE REPLACEMENT, TRANSFEMORAL     PROCEDURE ATTEMPTED & ABORTED   . UMBILICAL HERNIA REPAIR N/A 11/15/2019   Procedure: UMBILICAL HERNIA REPAIR;  Surgeon: Coralie Keens, MD;  Location: [facility];  Service: General;  Laterality: N/A;    Family History  Problem Relation Age of Onset  . Heart attack Mother   . Other Father        unsure of history  . Heart disease Brother   . Dementia Sister     Social History   Tobacco Use  . Smoking status: Former Smoker    Years: 1.00    Quit date: 1970    Years since quitting: 51.4  . Smokeless tobacco: Never Used  Substance Use Topics  . Alcohol use: No  . Drug use: No    Prior to Admission medications   Medication Sig Start Date End Date Taking? Authorizing Provider  ALPRAZolam Duanne Moron) 0.5 MG tablet Take 0.5 mg by mouth daily as needed for anxiety.     [provider]  amoxicillin (AMOXIL) 500 MG capsule TAKE 4 CAPSULES (2000 mg) BY MOUTH 60 MINUTES PRIOR TO DENTAL APPOINTMENT Patient taking differently: Take 2,000 mg by mouth See admin instructions. TAKE 4 CAPSULES (2000 mg) BY MOUTH 60 MINUTES  PRIOR TO DENTAL APPOINTMENT 10/17/19   Burnell Blanks, MD  ampicillin (PRINCIPEN) 500 MG capsule Take 500 mg by mouth 3 (three) times daily.     [provider]  Apoaequorin (PREVAGEN PO) Take 1 tablet by mouth daily.    [provider]  aspirin EC 81 MG EC tablet Take 1 tablet (81 mg total) by mouth daily. 11/15/18   Gold, Wayne E, PA-C  chlorhexidine (PERIDEX) 0.12 % solution 15 mLs by Mouth Rinse route in the morning and at bedtime. 10/24/19   [provider]  ciprofloxacin (CIPRO) 500 MG tablet Take 1 tablet (500 mg total) by mouth 2 (two) times daily. One po bid x 7 days 12/01/19   Oskar Cretella, [place], MD  finasteride (PROSCAR) 5 MG tablet Take 1  tablet (5 mg total) by mouth daily. 11/22/19   Kayleen Memos, DO  ibuprofen (ADVIL) 600 MG tablet Take 600 mg by mouth every 6 (six) hours as needed for pain. 10/24/19   [provider]  Multiple Vitamin (MULTIVITAMIN WITH MINERALS) TABS tablet Take 1 tablet by mouth daily. 11/22/19   Kayleen Memos, DO  polyethylene glycol (MIRALAX / GLYCOLAX) 17 g packet Take 17 g by mouth daily. 11/22/19   Kayleen Memos, DO  rosuvastatin (CRESTOR) 10 MG tablet Take 10 mg by mouth daily. 01/31/19   [provider]  sertraline (ZOLOFT) 100 MG tablet Take 100 mg by mouth daily.    [provider]  sildenafil (REVATIO) 20 MG tablet Take 20 mg by mouth daily as needed (ED).    [provider]  tamsulosin (FLOMAX) 0.4 MG CAPS capsule Take 1 capsule (0.4 mg total) by mouth daily. 11/22/19   Kayleen Memos, DO  traZODone (DESYREL) 50 MG tablet Take 50-75 mg by mouth at bedtime.     [provider]    Allergies Patient has no known allergies.   REVIEW OF SYSTEMS  Negative except as noted here or in the History of Present Illness.   PHYSICAL EXAMINATION  Initial Vital Signs Blood pressure (!) 162/84, pulse 60, temperature 98.7 F (37.1 C), temperature source Oral, resp. rate 18, height 5\' 9"  (1.753 m), weight 79.4 kg, SpO2 99 %.  Examination General: Well-developed, well-nourished male in no acute distress; appearance consistent with age of record HENT: normocephalic; atraumatic Eyes: pupils equal, round and reactive to light; extraocular muscles grossly intact Neck: supple Heart: regular rate and rhythm Lungs: clear to auscultation bilaterally Abdomen: soft; distended, tender bladder; bowel sounds present GU: Tanner V male, circumcised Extremities: No deformity; full range of motion; pulses normal; trace edema of lower legs Neurologic: Awake, alert; motor function intact in all extremities and symmetric; no facial droop Skin: Warm and dry Psychiatric: Flat affect    RESULTS  Summary of this visit's results, reviewed and interpreted by myself:   EKG Interpretation  Date/Time:    Ventricular Rate:    PR Interval:    QRS Duration:   QT Interval:    QTC Calculation:   R Axis:     Text Interpretation:        Laboratory Studies: Results for orders placed or performed during the hospital encounter of 12/01/19 (from the past 24 hour(s))  Urinalysis, Routine w reflex microscopic     Status: Abnormal   Collection Time: 12/01/19  1:15 AM  Result Value Ref Range   Color, Urine YELLOW YELLOW   APPearance HAZY (A) CLEAR   Specific Gravity, Urine 1.015 1.005 - 1.030  pH 6.5 5.0 - 8.0   Glucose, UA NEGATIVE NEGATIVE mg/dL   Hgb urine dipstick TRACE (A) NEGATIVE   Bilirubin Urine NEGATIVE NEGATIVE   Ketones, ur NEGATIVE NEGATIVE mg/dL   Protein, ur NEGATIVE NEGATIVE mg/dL   Nitrite POSITIVE (A) NEGATIVE   Leukocytes,Ua TRACE (A) NEGATIVE  Urinalysis, Microscopic (reflex)     Status: Abnormal   Collection Time: 12/01/19  1:15 AM  Result Value Ref Range   RBC / HPF NONE SEEN 0 - 5 RBC/hpf   WBC, UA 11-20 0 - 5 WBC/hpf   Bacteria, UA MANY (A) NONE SEEN   Squamous Epithelial / LPF 0-5 0 - 5   WBC Clumps PRESENT    Imaging Studies: No results found.  ED COURSE and MDM  Nursing notes, initial and subsequent vitals signs, including pulse oximetry, reviewed and interpreted by myself.  Vitals:   12/01/19 0100 12/01/19 0101  BP: (!) 162/84   Pulse: 60   Resp: 18   Temp: 98.7 F (37.1 C)   TempSrc: Oral   SpO2: 99%   Weight:  79.4 kg  Height:  5\' 9"  (1.753 m)   Medications  ciprofloxacin (CIPRO) tablet 500 mg (has no administration in time range)  lidocaine (XYLOCAINE) 2 % jelly (  Given 12/01/19 0120)    Foley catheter placed by nursing staff with significant relief of patient's discomfort.  We will place patient on Cipro for possible urinary tract infection.  He is scheduled to follow-up with Dr. Alinda Money of urology.  PROCEDURES   Procedures   ED DIAGNOSES     ICD-10-CM   1. Acute urinary retention  R33.8        Shanon Rosser, MD 12/01/19 [phone]

## 2019-12-01 NOTE — ED Triage Notes (Signed)
Pt c/o urinary rentention x 6 hrs , f/c removed today by urology

## 2019-12-05 DIAGNOSIS — I6523 Occlusion and stenosis of bilateral carotid arteries: Secondary | ICD-10-CM | POA: Diagnosis not present

## 2019-12-05 DIAGNOSIS — R339 Retention of urine, unspecified: Secondary | ICD-10-CM | POA: Diagnosis not present

## 2019-12-05 DIAGNOSIS — Z09 Encounter for follow-up examination after completed treatment for conditions other than malignant neoplasm: Secondary | ICD-10-CM | POA: Diagnosis not present

## 2019-12-06 ENCOUNTER — Encounter: Payer: Medicare HMO | Admitting: Surgery

## 2019-12-07 DIAGNOSIS — R330 Drug induced retention of urine: Secondary | ICD-10-CM | POA: Diagnosis not present

## 2019-12-12 ENCOUNTER — Other Ambulatory Visit: Payer: Self-pay

## 2019-12-12 ENCOUNTER — Ambulatory Visit: Payer: Medicare HMO | Attending: Internal Medicine

## 2019-12-12 DIAGNOSIS — G9340 Encephalopathy, unspecified: Secondary | ICD-10-CM | POA: Insufficient documentation

## 2019-12-12 DIAGNOSIS — R4701 Aphasia: Secondary | ICD-10-CM | POA: Insufficient documentation

## 2019-12-12 DIAGNOSIS — R41841 Cognitive communication deficit: Secondary | ICD-10-CM | POA: Diagnosis not present

## 2019-12-12 NOTE — Patient Instructions (Signed)
  If you have difficulty finding a word, when you do put it into 3 sentences with differing sentence structure.   We talked today about waiting for 3-4 weeks until therapy in case much or all of this clears up on its own with medications changed/eliminated in the next 5-7 days.

## 2019-12-12 NOTE — Therapy (Signed)
[facility] [address] [place], [place], [address] Phone: [phone]   Fax:  [phone]  Speech Language Pathology Evaluation  Patient Details  Name: Joshua Hess MRN: [id number] Date of Birth: 1941/02/18 Referring Provider (SLP): [name], MD   Encounter Date: 12/12/2019  End of Session - 12/12/19 1621    Visit Number  1    Number of Visits  9    Date for SLP Re-Evaluation  02/19/20    Authorization Type  [employer] -requested    Authorization Time Period  req- 01-19-20    SLP Start Time  1450    SLP Stop Time   1530    SLP Time Calculation (min)  40 min    Activity Tolerance  Patient tolerated treatment well       Past Medical History:  Diagnosis Date  . Aphasia   . BPH (benign prostatic hyperplasia)   . Cancer ([facility])    behind ear- melomona  . Carotid artery stenosis   . Chronotropic incompetence   . ED (erectile dysfunction)   . Elevated PSA   . HOH (hard of hearing)   . Mild CAD   . Plantar fasciitis   . Postoperative atrial fibrillation ([facility])   . Severe aortic stenosis    a. s/p tissue AVR 10/2018.  Marland Kitchen Sinus bradycardia    baseline  . Wears glasses     Past Surgical History:  Procedure Laterality Date  . AORTIC VALVE REPLACEMENT N/A 11/10/2018   Procedure: AORTIC VALVE REPLACEMENT (AVR), USING 23MM INSPIRIS;  Surgeon: Gaye Pollack, MD;  Location: [facility];  Service: Open Heart Surgery;  Laterality: N/A;  . EXTERNAL EAR SURGERY     melanoma removed  . HERNIA REPAIR Right    inguinal  . INGUINAL HERNIA REPAIR Right 11/15/2019   Procedure: LAPAROSCOPIC RIGHT INGUINAL HERNIA REPAIR WITH MESH;  Surgeon: Coralie Keens, MD;  Location: [facility];  Service: General;  Laterality: Right;  . RIGHT/LEFT HEART CATH AND CORONARY ANGIOGRAPHY N/A 09/29/2018   Procedure: RIGHT/LEFT HEART CATH AND CORONARY ANGIOGRAPHY;  Surgeon: Burnell Blanks, MD;  Location: [facility];  Service: Cardiovascular;   Laterality: N/A;  . TEE WITHOUT CARDIOVERSION N/A 11/10/2018   Procedure: TRANSESOPHAGEAL ECHOCARDIOGRAM (TEE);  Surgeon: Gaye Pollack, MD;  Location: [facility];  Service: Open Heart Surgery;  Laterality: N/A;  . TRANSCATHETER AORTIC VALVE REPLACEMENT, TRANSFEMORAL     PROCEDURE ATTEMPTED & ABORTED   . UMBILICAL HERNIA REPAIR N/A 11/15/2019   Procedure: UMBILICAL HERNIA REPAIR;  Surgeon: Coralie Keens, MD;  Location: [facility];  Service: General;  Laterality: N/A;    There were no vitals filed for this visit.  Subjective Assessment - 12/12/19 1513    Subjective  Pt reports that word finding is difficult, and feeling a waxing and waning "foggy" feeling throughout the day. Recently pt told partner to put a little bit of Tilex into a small bottle, for mouthwash, for him.    Patient is accompained by:  --   Partner- Mark   Currently in Pain?  No/denies         SLP Evaluation [facility] - 12/12/19 1513      SLP Visit Information   SLP Received On  12/12/19    Referring Provider (SLP)  [name], MD    Onset Date  early May 2021    Medical Diagnosis  Encephalopathy      Subjective   Patient/Family Stated Goal  "Anything I can  do to get back."      General Information   HPI  Pt with recent admission due to AMS - diagnosis of encephalopathy. Pt's partner states MD has not given a clear cause. Since d/c from hospital pt has improved greatly, however continues to have reported word finding deficits as well as waxing and waning times when he feels "foggy" and he is more confused. Pt also has rt hand tremor which began with initiation of Flomax. Pt and partner wonder if some sort of medication interaction is exacerbating/causing some or all of pt's remaining symptoms.       Prior Functional Status   Cognitive/Linguistic Baseline  Within functional limits    Type of Home  House     Lives With  --   partner [place]   Available Support  Friend(s)    Vocation  Retired      Associate Professor   Overall  Cognitive Status  Impaired/Different from baseline    Area of Impairment  Awareness    Awareness  Emergent;Intellectual    Awareness Comments  Pt unable to verbalize or generate any deficits in cognition. (only reports word finding/anomia)    Behaviors  Restless      Auditory Comprehension   Overall Auditory Comprehension  Appears within functional limits for tasks assessed      Verbal Expression   Overall Verbal Expression  Impaired   pt repoted   Other Verbal Expression Comments  pt conversation appears WNL today during evaluation with some minor slowing of verbal thought, however pt and [name] report pt has episodes of anomia during the day. As word finding skills tested as WNL, SLP wonders if s/sx anomia might be more cognitively based in attention or processing. Testing pt's cognitive linguistic skills may be necessary in the next 1-2 sessions.      Oral Motor/Sensory Function   Overall Oral Motor/Sensory Function  Appears within functional limits for tasks assessed      Motor Speech   Overall Motor Speech  Appears within functional limits for tasks assessed      Standardized Assessments   Standardized Assessments   [place] Naming Test-2nd edition    Boston Naming Test-2nd edition   55/60 - WNL                      SLP Education - 12/12/19 1619    Education Details  cueing pt for word, home task for word finding    Person(s) Educated  Patient;Spouse    Methods  Explanation    Comprehension  Verbalized understanding         SLP Long Term Goals - 12/12/19 1641      SLP LONG TERM GOAL #1   Title  pt will generate 10 minutes WNL mod complex/complex conversation with modified independence x2 sessions    Time  4    Period  Weeks   or 9 visits total for all LTGs unless specified otherwise   Status  New      SLP LONG TERM GOAL #2   Title  pt will report incr'd success with word finding than at time of eval    Time  4    Period  Weeks    Status  New      SLP  LONG TERM GOAL #3   Title  pt will undergo cognitive linguistic testing if necessary    Time  2    Period  Weeks    Status  New  Plan - 12/12/19 1629    Clinical Impression Statement  Pt presents today with dx of encephalopathy, unspecified (G93.40) with reported resulting anomia (Aphasia R47.01) and cognitive communication deficits. Time constraints today offered only the possibility of testing for anomia, which the patient scored WNL. SLP and pt agree skilled ST may benefit pt's verbal expression, at least for some word fiding strategies to improve his quality of life. SLP is unsure at this time if decr'd cognitive communication skills in terms of processing and organization are playing a negative role in pt's verbal expression. Further testing in this area may be necessary.    Speech Therapy Frequency  2x / week    Duration  4 weeks   or 9 total sessions (including eval)   Treatment/Interventions  Functional tasks;SLP instruction and feedback;Cueing hierarchy;Language facilitation;Cognitive reorganization;Compensatory strategies;Internal/external aids;Patient/family education   any or all may be used.   Potential to Achieve Goals  Good    Consulted and Agree with Plan of Care  Patient       Patient will benefit from skilled therapeutic intervention in order to improve the following deficits and impairments:   Aphasia  Cognitive communication deficit  Encephalopathy, unspecified    Problem List Patient Active Problem List   Diagnosis Date Noted  . Acute encephalopathy 11/18/2019  . Acute urinary retention 11/18/2019  . Post-operative state 11/18/2019  . Aphasia 08/07/2019  . S/P AVR (aortic valve replacement)   . Carotid artery stenosis   . Chronotropic incompetence   . Severe aortic stenosis   . Sinus bradycardia 08/12/2018  . Benign prostatic hyperplasia with nocturia 11/01/2012    [facility] ,[place], CCC-SLP  12/12/2019, 4:52 PM  [facility] [address] [place], [place], [address] Phone: [phone]   Fax:  [phone]  Name: MCLANE THEODOROU MRN: [id number] Date of Birth: [birth year]

## 2019-12-13 ENCOUNTER — Encounter: Payer: Self-pay | Admitting: Surgery

## 2019-12-13 ENCOUNTER — Ambulatory Visit: Payer: Medicare HMO | Admitting: Surgery

## 2019-12-13 VITALS — BP 137/80 | HR 52 | Temp 97.8°F | Resp 20 | Ht 69.0 in | Wt 169.0 lb

## 2019-12-13 DIAGNOSIS — Z952 Presence of prosthetic heart valve: Secondary | ICD-10-CM

## 2019-12-13 DIAGNOSIS — R972 Elevated prostate specific antigen [PSA]: Secondary | ICD-10-CM | POA: Diagnosis not present

## 2019-12-13 DIAGNOSIS — N401 Enlarged prostate with lower urinary tract symptoms: Secondary | ICD-10-CM | POA: Diagnosis not present

## 2019-12-13 DIAGNOSIS — R351 Nocturia: Secondary | ICD-10-CM | POA: Diagnosis not present

## 2019-12-13 NOTE — Progress Notes (Signed)
HPI:  The patient is a 79 year old gentleman who returns for 1 year follow-up status post aortic valve replacement using a 23 mm Edwards pericardial valve on 11/10/2018. He is here today with his partner, [name].  He saw Dr. Angelena Form on 11/02/2019 and was doing well at that time.  He subsequently underwent repair of a right inguinal hernia and umbilical hernia by Dr. Ninfa Linden on 11/15/2019 and said that he has had problems ever since.  He developed urinary retention and a urinary tract infection.  He said that he was started on Flomax and Proscar and has been dizzy and tremulous ever since.  He said he also has a tooth infection and is scheduled to have an extraction done at [facility] in the near future.  He is on ampicillin for that.  His partner said that he was doing great until his hernia repair running 3 to 5 miles at least 3 days/week.  Current Outpatient Medications  Medication Sig Dispense Refill  . ALPRAZolam (XANAX) 0.5 MG tablet Take 0.5 mg by mouth daily as needed for anxiety.     Marland Kitchen ampicillin (PRINCIPEN) 500 MG capsule Take 500 mg by mouth 3 (three) times daily.     Marland Kitchen Apoaequorin (PREVAGEN PO) Take 1 tablet by mouth daily.    Marland Kitchen aspirin EC 81 MG EC tablet Take 1 tablet (81 mg total) by mouth daily.    . chlorhexidine (PERIDEX) 0.12 % solution 15 mLs by Mouth Rinse route in the morning and at bedtime.    . finasteride (PROSCAR) 5 MG tablet Take 1 tablet (5 mg total) by mouth daily. 30 tablet 0  . ibuprofen (ADVIL) 600 MG tablet Take 600 mg by mouth every 6 (six) hours as needed for pain.    . Multiple Vitamin (MULTIVITAMIN WITH MINERALS) TABS tablet Take 1 tablet by mouth daily. 60 tablet 0  . polyethylene glycol (MIRALAX / GLYCOLAX) 17 g packet Take 17 g by mouth daily. 14 each 0  . rosuvastatin (CRESTOR) 10 MG tablet Take 10 mg by mouth daily.    . sertraline (ZOLOFT) 100 MG tablet Take 100 mg by mouth daily.    . sildenafil (REVATIO) 20 MG tablet Take 20 mg by mouth daily as needed (ED).     . tamsulosin (FLOMAX) 0.4 MG CAPS capsule Take 1 capsule (0.4 mg total) by mouth daily. 30 capsule 0  . traZODone (DESYREL) 50 MG tablet Take 50-75 mg by mouth at bedtime.     Marland Kitchen amoxicillin (AMOXIL) 500 MG capsule TAKE 4 CAPSULES (2000 mg) BY MOUTH 60 MINUTES PRIOR TO DENTAL APPOINTMENT (Patient not taking: Reported on 12/13/2019) 16 capsule 1   No current facility-administered medications for this visit.     Physical Exam: BP 137/80   Pulse (!) 52   Temp 97.8 F (36.6 C) (Skin)   Resp 20   Ht 5\' 9"  (1.753 m)   Wt 169 lb (76.7 kg)   SpO2 98% Comment: RS  BMI 24.96 kg/m  He looks very anxious and is barely conversant.  He does have a resting tremor. Cardiac exam shows a regular rate and rhythm with normal heart sounds.  There is no murmur. Lungs are clear. The chest is scar is barely visible.  The sternum is stable.  Diagnostic Tests:  None today  Impression:  He is doing well from a cardiac surgery standpoint 1 year following aortic valve replacement.     Plan:  Further follow-up will be done by Dr. Angelena Form.  He has an appointment later today with Dr. Alinda Money from urology and will discuss possibility of stopping the Flomax which he said has been causing him dizziness.    Gaye Pollack, MD [facility] [phone]

## 2019-12-22 DIAGNOSIS — R330 Drug induced retention of urine: Secondary | ICD-10-CM | POA: Diagnosis not present

## 2019-12-22 DIAGNOSIS — R972 Elevated prostate specific antigen [PSA]: Secondary | ICD-10-CM | POA: Diagnosis not present

## 2019-12-22 DIAGNOSIS — N401 Enlarged prostate with lower urinary tract symptoms: Secondary | ICD-10-CM | POA: Diagnosis not present

## 2019-12-22 DIAGNOSIS — R351 Nocturia: Secondary | ICD-10-CM | POA: Diagnosis not present

## 2019-12-28 ENCOUNTER — Other Ambulatory Visit: Payer: Self-pay | Admitting: Cardiovascular Disease

## 2019-12-29 NOTE — Telephone Encounter (Signed)
Pt's pharmacy is requesting a refill on amoxicillin for a dental procedure. Would Dr. Angelena Form like to refill this medication? Please address

## 2020-01-10 ENCOUNTER — Ambulatory Visit: Payer: Medicare HMO

## 2020-01-10 ENCOUNTER — Other Ambulatory Visit: Payer: Self-pay

## 2020-01-10 DIAGNOSIS — G9340 Encephalopathy, unspecified: Secondary | ICD-10-CM | POA: Diagnosis not present

## 2020-01-10 DIAGNOSIS — R4701 Aphasia: Secondary | ICD-10-CM

## 2020-01-10 DIAGNOSIS — R41841 Cognitive communication deficit: Secondary | ICD-10-CM | POA: Diagnosis not present

## 2020-01-10 NOTE — Therapy (Signed)
[facility] [address] [place], [place], [address] Phone: [phone]   Fax:  [phone]  Speech Language Pathology Treatment  Patient Details  Name: Joshua Hess MRN: [id number] Date of Birth: [birth year] Referring Provider (SLP): [name], MD   Encounter Date: 01/10/2020   End of Session - 01/10/20 1746    Visit Number 2    Number of Visits 9    Date for SLP Re-Evaluation 02/19/20    Authorization Type [employer] -requested    SLP Start Time 0935    SLP Stop Time  1020    SLP Time Calculation (min) 45 min    Activity Tolerance Patient tolerated treatment well           Past Medical History:  Diagnosis Date  . Aphasia   . BPH (benign prostatic hyperplasia)   . 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 Carotid artery stenosis   . Chronotropic incompetence   . ED (erectile dysfunction)   . Elevated PSA   . HOH (hard of hearing)   . Mild CAD   . Plantar fasciitis   . Postoperative atrial fibrillation ([place])   . Severe aortic stenosis    a. s/p tissue AVR 10/2018.  Marland Kitchen Sinus bradycardia    baseline  . Wears glasses     Past Surgical History:  Procedure Laterality Date  . AORTIC VALVE REPLACEMENT N/A 11/10/2018   Procedure: AORTIC VALVE REPLACEMENT (AVR), USING 23MM INSPIRIS;  Surgeon: Gaye Pollack, MD;  Location: [facility];  Service: Open Heart Surgery;  Laterality: N/A;  . EXTERNAL EAR SURGERY     melanoma removed  . HERNIA REPAIR Right    inguinal  . INGUINAL HERNIA REPAIR Right 11/15/2019   Procedure: LAPAROSCOPIC RIGHT INGUINAL HERNIA REPAIR WITH MESH;  Surgeon: Coralie Keens, MD;  Location: [facility];  Service: General;  Laterality: Right;  . RIGHT/LEFT HEART CATH AND CORONARY ANGIOGRAPHY N/A 09/29/2018   Procedure: RIGHT/LEFT HEART CATH AND CORONARY ANGIOGRAPHY;  Surgeon: Burnell Blanks, MD;  Location: [facility];  Service: Cardiovascular;  Laterality: N/A;  . TEE WITHOUT  CARDIOVERSION N/A 11/10/2018   Procedure: TRANSESOPHAGEAL ECHOCARDIOGRAM (TEE);  Surgeon: Gaye Pollack, MD;  Location: [facility];  Service: Open Heart Surgery;  Laterality: N/A;  . TRANSCATHETER AORTIC VALVE REPLACEMENT, TRANSFEMORAL     PROCEDURE ATTEMPTED & ABORTED   . UMBILICAL HERNIA REPAIR N/A 11/15/2019   Procedure: UMBILICAL HERNIA REPAIR;  Surgeon: Coralie Keens, MD;  Location: [facility];  Service: General;  Laterality: N/A;    There were no vitals filed for this visit.   Subjective Assessment - 01/10/20 0943    Subjective Pt feels he has mildly improved. He still has to use more general terms instead of the exact word he desires.    Patient is accompained by: --   [name] - friend   Currently in Pain? No/denies                 ADULT SLP TREATMENT - 01/10/20 0945      General Information   Behavior/Cognition Alert;Cooperative;Pleasant mood      Treatment Provided   Treatment provided Cognitive-Linquistic      Cognitive-Linquistic Treatment   Treatment focused on Aphasia    Skilled Treatment Pt states that he has noticed improvement with verbal organization/word finding and partner agrees. SLP performed semantic feature analysis with pt today, explaining that this is a therapy technique which has been proven to improve  pt's verbal flexibility and adeptness. SLP chose an object pt named was of interest to him (pine needles, as pt stated he enjoys working in the yard). Pt stated throughout the session that he did not see the purpose of the exercise. SLP explained again the purpose of the task for pt. Pt with excellent success with verbal flexibility and his level of verbal expression with each aspect of SFA. SLP explained that pt may be doing "too well for therapy" at this point but may not yet be at his baseline. Pt then expressed furstration at changes since hospitalization and SLP suggested to pt and [name] that they think about talkkng to someone trained to assist people work  through these feelings. [name] explained that pt has someone already that they will likely contact. SLP suggested one more session to go over SFA again as well as V-NesT and go over aonima compensations. SLP also gave option to pt to cancel that last session in two weeks if he feels like his condition has improved to the point he no longer needs skilled ST.       Assessment / Recommendations / Plan   Plan Continue with current plan of care      Progression Toward Goals   Progression toward goals Not progressing toward goals (comment)   pt appears somewhat reticent or defensive about ST tasks           SLP Education - 01/10/20 1744    Education Details semantic feature analysis (SFA), one more session needed to wrap up home tasks and go over compensations for anomia    Person(s) Educated Patient;Other (comment)   Mark   Methods Explanation    Comprehension Verbalized understanding;Verbal cues required;Need further instruction              SLP Long Term Goals - 01/10/20 1750      SLP LONG TERM GOAL #1   Title pt will generate 10 minutes WNL mod complex/complex conversation with modified independence x2 sessions    Time 4    Period Weeks   or 9 visits total for all LTGs unless specified otherwise   Status On-going      SLP LONG TERM GOAL #2   Title pt will report incr'd success with word finding than at time of eval    Time 4    Period Weeks    Status On-going      SLP LONG TERM GOAL #3   Title pt will undergo cognitive linguistic testing if necessary    Status Deferred   resolved           Plan - 01/10/20 1747    Clinical Impression Statement Pt presents today with reported improved anomia (Aphasia R47.01) since SLE 12-12-19. [name] reports pt's confusion has resolved. Pt agreed to one more session training pt with some at-home tasks and in some anomia compensations. He perfomed very well today with semantic feature analysis (SFA), and may have resolved to the point he may not  beneift from skilled ST at this time. Additionally, pt appeared reluctant or reticent, or defensive about engaging with ST tasks with SLP.    Speech Therapy Frequency 2x / week    Duration 4 weeks   or 9 total sessions (including eval)   Treatment/Interventions Functional tasks;SLP instruction and feedback;Cueing hierarchy;Language facilitation;Cognitive reorganization;Compensatory strategies;Internal/external aids;Patient/family education   any or all may be used.   Potential to Achieve Goals Good    Consulted and Agree with Plan of Care  Patient           Patient will benefit from skilled therapeutic intervention in order to improve the following deficits and impairments:   Aphasia    Problem List Patient Active Problem List   Diagnosis Date Noted  . Acute encephalopathy 11/18/2019  . Acute urinary retention 11/18/2019  . Post-operative state 11/18/2019  . Aphasia 08/07/2019  . S/P AVR (aortic valve replacement)   . Carotid artery stenosis   . Chronotropic incompetence   . Severe aortic stenosis   . Sinus bradycardia 08/12/2018  . Benign prostatic hyperplasia with nocturia 11/01/2012    [facility] ,MS, CCC-SLP  01/10/2020, 5:50 PM  [facility] [address] [place], [place], [address] Phone: [phone]   Fax:  [phone]   Name: CHORD TAKAHASHI MRN: [id number] Date of Birth: Nov 23, 1940

## 2020-01-18 ENCOUNTER — Other Ambulatory Visit: Payer: Self-pay | Admitting: Surgery

## 2020-01-18 DIAGNOSIS — R109 Unspecified abdominal pain: Secondary | ICD-10-CM

## 2020-01-19 ENCOUNTER — Ambulatory Visit
Admission: RE | Admit: 2020-01-19 | Discharge: 2020-01-19 | Disposition: A | Discharge status: Home / Self Care | Payer: Medicare HMO | Source: Ambulatory Visit | Attending: Surgery | Admitting: Surgery

## 2020-01-19 DIAGNOSIS — R109 Unspecified abdominal pain: Secondary | ICD-10-CM

## 2020-01-19 DIAGNOSIS — I700 Atherosclerosis of aorta: Secondary | ICD-10-CM | POA: Diagnosis not present

## 2020-01-19 DIAGNOSIS — N400 Enlarged prostate without lower urinary tract symptoms: Secondary | ICD-10-CM | POA: Diagnosis not present

## 2020-01-19 MED ORDER — IOPAMIDOL (ISOVUE-300) INJECTION 61%
100.0000 mL | Freq: Once | INTRAVENOUS | Status: AC | PRN
  Administered 2020-01-19: 100 mL via INTRAVENOUS

## 2020-01-24 ENCOUNTER — Ambulatory Visit: Payer: Medicare HMO

## 2020-01-31 DIAGNOSIS — I6523 Occlusion and stenosis of bilateral carotid arteries: Secondary | ICD-10-CM | POA: Diagnosis not present

## 2020-01-31 DIAGNOSIS — Z125 Encounter for screening for malignant neoplasm of prostate: Secondary | ICD-10-CM | POA: Diagnosis not present

## 2020-02-05 DIAGNOSIS — Z0000 Encounter for general adult medical examination without abnormal findings: Secondary | ICD-10-CM | POA: Diagnosis not present

## 2020-02-05 DIAGNOSIS — G43109 Migraine with aura, not intractable, without status migrainosus: Secondary | ICD-10-CM | POA: Diagnosis not present

## 2020-02-05 DIAGNOSIS — I38 Endocarditis, valve unspecified: Secondary | ICD-10-CM | POA: Diagnosis not present

## 2020-02-05 DIAGNOSIS — D179 Benign lipomatous neoplasm, unspecified: Secondary | ICD-10-CM | POA: Diagnosis not present

## 2020-02-05 DIAGNOSIS — I6523 Occlusion and stenosis of bilateral carotid arteries: Secondary | ICD-10-CM | POA: Diagnosis not present

## 2020-02-05 DIAGNOSIS — R972 Elevated prostate specific antigen [PSA]: Secondary | ICD-10-CM | POA: Diagnosis not present

## 2020-02-05 DIAGNOSIS — N401 Enlarged prostate with lower urinary tract symptoms: Secondary | ICD-10-CM | POA: Diagnosis not present

## 2020-02-05 DIAGNOSIS — F419 Anxiety disorder, unspecified: Secondary | ICD-10-CM | POA: Diagnosis not present

## 2020-02-05 DIAGNOSIS — F5104 Psychophysiologic insomnia: Secondary | ICD-10-CM | POA: Diagnosis not present

## 2020-02-22 ENCOUNTER — Other Ambulatory Visit: Payer: Self-pay

## 2020-02-22 ENCOUNTER — Ambulatory Visit: Payer: Medicare HMO | Admitting: Physician Assistant

## 2020-02-22 ENCOUNTER — Ambulatory Visit (HOSPITAL_COMMUNITY)
Admission: RE | Admit: 2020-02-22 | Discharge: 2020-02-22 | Disposition: A | Discharge status: Home / Self Care | Payer: Medicare HMO | Source: Ambulatory Visit | Attending: Vascular Surgery | Admitting: Vascular Surgery

## 2020-02-22 VITALS — BP 125/78 | HR 49 | Temp 97.9°F | Resp 18 | Ht 69.0 in | Wt 171.0 lb

## 2020-02-22 DIAGNOSIS — I6523 Occlusion and stenosis of bilateral carotid arteries: Secondary | ICD-10-CM

## 2020-02-22 NOTE — Progress Notes (Signed)
History of Present Illness:  Patient is a 79 y.o. year old male who presents for evaluation of carotid stenosis.  The patient denies symptoms of TIA, amaurosis, or stroke.  The patient is currently on ASA antiplatelet therapy and daily Statin.    The carotid stenosis was found by his primary care physician.  Based on duplex left ICA stenosis estimated to be 50 to 69% with right ICA stenosis less than 50%.  Duplex was ordered due to a transient speech difficulty episode the patient experienced.  Other medical problems includeaortic valve replacement using a 23 mm Edwards pericardial valve on 11/10/2018 by Dr. Cyndia Bent.  He recently was hospitalized for hernia repair and became septic post op in May 2021.    He continues to be active daily with run/walking up to 4 miles daily.    Past Medical History:  Diagnosis Date  . Aphasia   . BPH (benign prostatic hyperplasia)   . 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 Carotid artery stenosis   . Chronotropic incompetence   . ED (erectile dysfunction)   . Elevated PSA   . HOH (hard of hearing)   . Mild CAD   . Plantar fasciitis   . Postoperative atrial fibrillation ([place])   . Severe aortic stenosis    a. s/p tissue AVR 10/2018.  Marland Kitchen Sinus bradycardia    baseline  . Wears glasses     Past Surgical History:  Procedure Laterality Date  . AORTIC VALVE REPLACEMENT N/A 11/10/2018   Procedure: AORTIC VALVE REPLACEMENT (AVR), USING 23MM INSPIRIS;  Surgeon: Gaye Pollack, MD;  Location: [facility];  Service: Open Heart Surgery;  Laterality: N/A;  . EXTERNAL EAR SURGERY     melanoma removed  . HERNIA REPAIR Right    inguinal  . INGUINAL HERNIA REPAIR Right 11/15/2019   Procedure: LAPAROSCOPIC RIGHT INGUINAL HERNIA REPAIR WITH MESH;  Surgeon: Coralie Keens, MD;  Location: [facility];  Service: General;  Laterality: Right;  . RIGHT/LEFT HEART CATH AND CORONARY ANGIOGRAPHY N/A 09/29/2018   Procedure: RIGHT/LEFT HEART CATH AND CORONARY ANGIOGRAPHY;  Surgeon:  Burnell Blanks, MD;  Location: [facility];  Service: Cardiovascular;  Laterality: N/A;  . TEE WITHOUT CARDIOVERSION N/A 11/10/2018   Procedure: TRANSESOPHAGEAL ECHOCARDIOGRAM (TEE);  Surgeon: Gaye Pollack, MD;  Location: [facility];  Service: Open Heart Surgery;  Laterality: N/A;  . TRANSCATHETER AORTIC VALVE REPLACEMENT, TRANSFEMORAL     PROCEDURE ATTEMPTED & ABORTED   . UMBILICAL HERNIA REPAIR N/A 11/15/2019   Procedure: UMBILICAL HERNIA REPAIR;  Surgeon: Coralie Keens, MD;  Location: [facility];  Service: General;  Laterality: N/A;     Social History Social History   Tobacco Use  . Smoking status: Former Smoker    Years: 1.00    Quit date: 1970    Years since quitting: 51.6  . Smokeless tobacco: Never Used  Vaping Use  . Vaping Use: Never used  Substance Use Topics  . Alcohol use: No  . Drug use: No    Family History Family History  Problem Relation Age of Onset  . Heart attack Mother   . Other Father        unsure of history  . Heart disease Brother   . Dementia Sister     Allergies  No Known Allergies   Current Outpatient Medications  Medication Sig Dispense Refill  . Apoaequorin (PREVAGEN PO) Take 1 tablet by mouth daily.    Marland Kitchen aspirin EC  81 MG EC tablet Take 1 tablet (81 mg total) by mouth daily.    . finasteride (PROSCAR) 5 MG tablet Take 1 tablet (5 mg total) by mouth daily. 30 tablet 0  . rosuvastatin (CRESTOR) 10 MG tablet Take 10 mg by mouth daily.    . sertraline (ZOLOFT) 100 MG tablet Take 100 mg by mouth daily.    . sildenafil (REVATIO) 20 MG tablet Take 20 mg by mouth daily as needed (ED).    . traZODone (DESYREL) 50 MG tablet Take 50-75 mg by mouth at bedtime.     . ALPRAZolam (XANAX) 0.5 MG tablet Take 0.5 mg by mouth daily as needed for anxiety.  (Patient not taking: Reported on 02/22/2020)    . amoxicillin (AMOXIL) 500 MG capsule TAKE 4 CAPSULES BY MOUTH ONE TO TWO HOURS PRIOR TO DENTAL APPOINTMENT (Patient not taking: Reported on  02/22/2020) 16 capsule 0  . ampicillin (PRINCIPEN) 500 MG capsule Take 500 mg by mouth 3 (three) times daily.     . chlorhexidine (PERIDEX) 0.12 % solution 15 mLs by Mouth Rinse route in the morning and at bedtime.    Marland Kitchen ibuprofen (ADVIL) 600 MG tablet Take 600 mg by mouth every 6 (six) hours as needed for pain.    . Multiple Vitamin (MULTIVITAMIN WITH MINERALS) TABS tablet Take 1 tablet by mouth daily. (Patient not taking: Reported on 02/22/2020) 60 tablet 0  . polyethylene glycol (MIRALAX / GLYCOLAX) 17 g packet Take 17 g by mouth daily. (Patient not taking: Reported on 02/22/2020) 14 each 0  . tamsulosin (FLOMAX) 0.4 MG CAPS capsule Take 1 capsule (0.4 mg total) by mouth daily. 30 capsule 0   No current facility-administered medications for this visit.    ROS:   General:  No weight loss, Fever, chills  HEENT: No recent headaches, no nasal bleeding, no visual changes, no sore throat [x]  hearing difficulties  Neurologic: No dizziness, blackouts, seizures. No recent symptoms of stroke or mini- stroke. No recent episodes of slurred speech, or temporary blindness.  Cardiac: No recent episodes of chest pain/pressure, no shortness of breath at rest.  No shortness of breath with exertion.  Denies history of atrial fibrillation or irregular heartbeat  Vascular: No history of rest pain in feet.  No history of claudication.  No history of non-healing ulcer, No history of DVT   Pulmonary: No home oxygen, no productive cough, no hemoptysis,  No asthma or wheezing  Musculoskeletal:  [ ]  Arthritis, [ ]  Low back pain,  [ ]  Joint pain  Hematologic:No history of hypercoagulable state.  No history of easy bleeding.  No history of anemia  Gastrointestinal: No hematochezia or melena,  No gastroesophageal reflux, no trouble swallowing  Urinary: [ ]  chronic Kidney disease, [ ]  on HD - [ ]  MWF or [ ]  TTHS, [ ]  Burning with urination, [ ]  Frequent urination, [ ]  Difficulty urinating;   Skin: No  rashes  Psychological: No history of anxiety,  No history of depression   Physical Examination  Vitals:   02/22/20 1046 02/22/20 1050  BP: 124/70 125/78  Pulse: (!) 52 (!) 49  Resp: 18   Temp: 97.9 F (36.6 C)   TempSrc: Temporal   SpO2: 97%   Weight: 171 lb (77.6 kg)   Height: 5\' 9"  (1.753 m)     Body mass index is 25.25 kg/m.  General:  Alert and oriented, no acute distress HEENT: Normal, decreased hearing  Neck: No bruit or JVD Pulmonary: Clear to auscultation  bilaterally Cardiac: Regular Rate and Rhythm without murmur, audible valve motion  Gastrointestinal: Soft, non-tender, non-distended, no mass, no scars Skin: No rash Extremity Pulses:  2+ radial, brachial, femoral, dorsalis pedis, posterior tibial pulses bilaterally Musculoskeletal: No deformity or edema  Neurologic: Upper and lower extremity motor 5/5 and symmetric  DATA:  Right Carotid Findings:  +----------+--------+--------+--------+-------------------------+--------+       PSV cm/sEDV cm/sStenosisPlaque Description    Comments  +----------+--------+--------+--------+-------------------------+--------+  CCA Prox 119   18       heterogenous             +----------+--------+--------+--------+-------------------------+--------+  CCA Mid  114   19       heterogenous and calcific      +----------+--------+--------+--------+-------------------------+--------+  CCA Distal103   22                          +----------+--------+--------+--------+-------------------------+--------+  ICA Prox 74   9        heterogenous             +----------+--------+--------+--------+-------------------------+--------+  ICA Mid  64   22                          +----------+--------+--------+--------+-------------------------+--------+  ICA Distal69   18                           +----------+--------+--------+--------+-------------------------+--------+  ECA    101   12                          +----------+--------+--------+--------+-------------------------+--------+   +----------+--------+-------+----------------+-------------------+       PSV cm/sEDV cmsDescribe    Arm Pressure (mmHG)  +----------+--------+-------+----------------+-------------------+  Subclavian101   3   Multiphasic, WNL            +----------+--------+-------+----------------+-------------------+   +---------+--------+--+--------+-+---------+  VertebralPSV cm/s45EDV cm/s9Antegrade  +---------+--------+--+--------+-+---------+      Left Carotid Findings:  +----------+--------+--------+--------+------------------+--------+       PSV cm/sEDV cm/sStenosisPlaque DescriptionComments  +----------+--------+--------+--------+------------------+--------+  CCA Prox 110   14       homogeneous          +----------+--------+--------+--------+------------------+--------+  CCA Mid  96   20       heterogenous         +----------+--------+--------+--------+------------------+--------+  CCA Distal97   21       heterogenous         +----------+--------+--------+--------+------------------+--------+  ICA Prox 262   58   40-59% calcific           +----------+--------+--------+--------+------------------+--------+  ICA Mid  86   18                      +----------+--------+--------+--------+------------------+--------+  ICA Distal58   18                      +----------+--------+--------+--------+------------------+--------+  ECA    265   45   >50%  heterogenous          +----------+--------+--------+--------+------------------+--------+   +----------+--------+--------+----------------+-------------------+       PSV cm/sEDV cm/sDescribe    Arm Pressure (mmHG)  +----------+--------+--------+----------------+-------------------+  Subclavian122   3    Multiphasic, WNL            +----------+--------+--------+----------------+-------------------+   +---------+--------+--+--------+--+---------+  VertebralPSV cm/s56EDV cm/s11Antegrade  +---------+--------+--+--------+--+---------+      ASSESSMENT:  Carotid stenosis left 40-59% calcified proximal ICA Right ICA <  39%  He remains asymptomatic and denise amaurosis, weakness and no recurrent aphasia symptoms.    PLAN: He will f/u in 1 year for repeat carotid duplex surveillance.  We reviewed signs and symptoms of stroke/TIA.  If these occur he will call 911.  Cont. Daily Statin and ASA for maximum medical management.     Roxy Horseman PA-C [facility] Office: [phone]  MD in clinic [place]

## 2020-02-23 DIAGNOSIS — D692 Other nonthrombocytopenic purpura: Secondary | ICD-10-CM | POA: Diagnosis not present

## 2020-02-23 DIAGNOSIS — D485 Neoplasm of uncertain behavior of skin: Secondary | ICD-10-CM | POA: Diagnosis not present

## 2020-02-23 DIAGNOSIS — L905 Scar conditions and fibrosis of skin: Secondary | ICD-10-CM | POA: Diagnosis not present

## 2020-02-23 DIAGNOSIS — L570 Actinic keratosis: Secondary | ICD-10-CM | POA: Diagnosis not present

## 2020-02-23 DIAGNOSIS — L821 Other seborrheic keratosis: Secondary | ICD-10-CM | POA: Diagnosis not present

## 2020-02-23 DIAGNOSIS — L814 Other melanin hyperpigmentation: Secondary | ICD-10-CM | POA: Diagnosis not present

## 2020-02-23 DIAGNOSIS — C44722 Squamous cell carcinoma of skin of right lower limb, including hip: Secondary | ICD-10-CM | POA: Diagnosis not present

## 2020-02-23 DIAGNOSIS — Z85820 Personal history of malignant melanoma of skin: Secondary | ICD-10-CM | POA: Diagnosis not present

## 2020-02-23 DIAGNOSIS — Z85828 Personal history of other malignant neoplasm of skin: Secondary | ICD-10-CM | POA: Diagnosis not present

## 2020-02-23 DIAGNOSIS — C44629 Squamous cell carcinoma of skin of left upper limb, including shoulder: Secondary | ICD-10-CM | POA: Diagnosis not present

## 2020-02-23 DIAGNOSIS — C44729 Squamous cell carcinoma of skin of left lower limb, including hip: Secondary | ICD-10-CM | POA: Diagnosis not present

## 2020-03-01 MED ORDER — AMOXICILLIN 500 MG PO CAPS: ORAL_CAPSULE | ORAL | 1 refills | Status: DC

## 2020-03-05 ENCOUNTER — Emergency Department (HOSPITAL_BASED_OUTPATIENT_CLINIC_OR_DEPARTMENT_OTHER): Payer: Medicare HMO

## 2020-03-05 ENCOUNTER — Encounter (HOSPITAL_BASED_OUTPATIENT_CLINIC_OR_DEPARTMENT_OTHER): Payer: Self-pay | Admitting: *Deleted

## 2020-03-05 ENCOUNTER — Inpatient Hospital Stay (HOSPITAL_BASED_OUTPATIENT_CLINIC_OR_DEPARTMENT_OTHER)
Admission: EM | Admit: 2020-03-05 | Discharge: 2020-03-09 | DRG: 039 | Disposition: A | Discharge status: Home / Self Care | Payer: Medicare HMO | Attending: Internal Medicine | Admitting: Internal Medicine

## 2020-03-05 ENCOUNTER — Observation Stay (HOSPITAL_COMMUNITY): Payer: Medicare HMO

## 2020-03-05 ENCOUNTER — Other Ambulatory Visit: Payer: Self-pay

## 2020-03-05 DIAGNOSIS — I491 Atrial premature depolarization: Secondary | ICD-10-CM | POA: Diagnosis present

## 2020-03-05 DIAGNOSIS — I6502 Occlusion and stenosis of left vertebral artery: Secondary | ICD-10-CM | POA: Diagnosis not present

## 2020-03-05 DIAGNOSIS — I350 Nonrheumatic aortic (valve) stenosis: Secondary | ICD-10-CM | POA: Diagnosis not present

## 2020-03-05 DIAGNOSIS — H9190 Unspecified hearing loss, unspecified ear: Secondary | ICD-10-CM | POA: Diagnosis present

## 2020-03-05 DIAGNOSIS — Z87891 Personal history of nicotine dependence: Secondary | ICD-10-CM | POA: Diagnosis not present

## 2020-03-05 DIAGNOSIS — Z01810 Encounter for preprocedural cardiovascular examination: Secondary | ICD-10-CM | POA: Diagnosis not present

## 2020-03-05 DIAGNOSIS — I6523 Occlusion and stenosis of bilateral carotid arteries: Secondary | ICD-10-CM | POA: Diagnosis not present

## 2020-03-05 DIAGNOSIS — I6521 Occlusion and stenosis of right carotid artery: Secondary | ICD-10-CM | POA: Diagnosis not present

## 2020-03-05 DIAGNOSIS — R29700 NIHSS score 0: Secondary | ICD-10-CM | POA: Diagnosis present

## 2020-03-05 DIAGNOSIS — E785 Hyperlipidemia, unspecified: Secondary | ICD-10-CM | POA: Diagnosis present

## 2020-03-05 DIAGNOSIS — F419 Anxiety disorder, unspecified: Secondary | ICD-10-CM | POA: Diagnosis present

## 2020-03-05 DIAGNOSIS — I63032 Cerebral infarction due to thrombosis of left carotid artery: Principal | ICD-10-CM | POA: Diagnosis present

## 2020-03-05 DIAGNOSIS — N401 Enlarged prostate with lower urinary tract symptoms: Secondary | ICD-10-CM | POA: Diagnosis present

## 2020-03-05 DIAGNOSIS — R42 Dizziness and giddiness: Secondary | ICD-10-CM | POA: Diagnosis not present

## 2020-03-05 DIAGNOSIS — Z952 Presence of prosthetic heart valve: Secondary | ICD-10-CM

## 2020-03-05 DIAGNOSIS — Z66 Do not resuscitate: Secondary | ICD-10-CM | POA: Diagnosis present

## 2020-03-05 DIAGNOSIS — Z8673 Personal history of transient ischemic attack (TIA), and cerebral infarction without residual deficits: Secondary | ICD-10-CM | POA: Diagnosis not present

## 2020-03-05 DIAGNOSIS — I493 Ventricular premature depolarization: Secondary | ICD-10-CM | POA: Diagnosis present

## 2020-03-05 DIAGNOSIS — Z8249 Family history of ischemic heart disease and other diseases of the circulatory system: Secondary | ICD-10-CM | POA: Diagnosis not present

## 2020-03-05 DIAGNOSIS — I10 Essential (primary) hypertension: Secondary | ICD-10-CM | POA: Diagnosis present

## 2020-03-05 DIAGNOSIS — I951 Orthostatic hypotension: Secondary | ICD-10-CM | POA: Diagnosis not present

## 2020-03-05 DIAGNOSIS — Z85820 Personal history of malignant melanoma of skin: Secondary | ICD-10-CM | POA: Diagnosis not present

## 2020-03-05 DIAGNOSIS — R001 Bradycardia, unspecified: Secondary | ICD-10-CM | POA: Diagnosis not present

## 2020-03-05 DIAGNOSIS — I2510 Atherosclerotic heart disease of native coronary artery without angina pectoris: Secondary | ICD-10-CM | POA: Diagnosis present

## 2020-03-05 DIAGNOSIS — Z20822 Contact with and (suspected) exposure to covid-19: Secondary | ICD-10-CM | POA: Diagnosis present

## 2020-03-05 DIAGNOSIS — I361 Nonrheumatic tricuspid (valve) insufficiency: Secondary | ICD-10-CM | POA: Diagnosis not present

## 2020-03-05 DIAGNOSIS — R29810 Facial weakness: Secondary | ICD-10-CM | POA: Diagnosis present

## 2020-03-05 DIAGNOSIS — G9389 Other specified disorders of brain: Secondary | ICD-10-CM | POA: Diagnosis not present

## 2020-03-05 DIAGNOSIS — I6522 Occlusion and stenosis of left carotid artery: Secondary | ICD-10-CM | POA: Diagnosis not present

## 2020-03-05 DIAGNOSIS — R351 Nocturia: Secondary | ICD-10-CM | POA: Diagnosis present

## 2020-03-05 DIAGNOSIS — D696 Thrombocytopenia, unspecified: Secondary | ICD-10-CM | POA: Diagnosis not present

## 2020-03-05 DIAGNOSIS — F329 Major depressive disorder, single episode, unspecified: Secondary | ICD-10-CM | POA: Diagnosis present

## 2020-03-05 DIAGNOSIS — I639 Cerebral infarction, unspecified: Secondary | ICD-10-CM

## 2020-03-05 DIAGNOSIS — I340 Nonrheumatic mitral (valve) insufficiency: Secondary | ICD-10-CM | POA: Diagnosis not present

## 2020-03-05 DIAGNOSIS — I6529 Occlusion and stenosis of unspecified carotid artery: Secondary | ICD-10-CM | POA: Diagnosis present

## 2020-03-05 DIAGNOSIS — R29818 Other symptoms and signs involving the nervous system: Secondary | ICD-10-CM | POA: Diagnosis not present

## 2020-03-05 DIAGNOSIS — Z79899 Other long term (current) drug therapy: Secondary | ICD-10-CM | POA: Diagnosis not present

## 2020-03-05 DIAGNOSIS — Z7982 Long term (current) use of aspirin: Secondary | ICD-10-CM | POA: Diagnosis not present

## 2020-03-05 DIAGNOSIS — I69392 Facial weakness following cerebral infarction: Secondary | ICD-10-CM | POA: Diagnosis not present

## 2020-03-05 HISTORY — DX: Cerebral infarction, unspecified: I63.9

## 2020-03-05 LAB — COMPREHENSIVE METABOLIC PANEL
ALT: 21 U/L (ref 0–44)
AST: 22 U/L (ref 15–41)
Albumin: 3.7 g/dL (ref 3.5–5.0)
Alkaline Phosphatase: 45 U/L (ref 38–126)
Anion gap: 9 (ref 5–15)
BUN: 20 mg/dL (ref 8–23)
CO2: 27 mmol/L (ref 22–32)
Calcium: 8.7 mg/dL — ABNORMAL LOW (ref 8.9–10.3)
Chloride: 103 mmol/L (ref 98–111)
Creatinine, Ser: 1.01 mg/dL (ref 0.61–1.24)
GFR calc Af Amer: >60 mL/min (ref >60)
GFR calc non Af Amer: >60 mL/min (ref >60)
Glucose, Bld: 108 mg/dL — ABNORMAL HIGH (ref 70–99)
Potassium: 4.3 mmol/L (ref 3.5–5.1)
Sodium: 139 mmol/L (ref 135–145)
Total Bilirubin: 0.6 mg/dL (ref 0.3–1.2)
Total Protein: 6.5 g/dL (ref 6.5–8.1)

## 2020-03-05 LAB — DIFFERENTIAL
Abs Immature Granulocytes: 0.02 10*3/uL (ref 0.00–0.07)
Basophils Absolute: 0 10*3/uL (ref 0.0–0.1)
Basophils Relative: 1 %
Eosinophils Absolute: 0.1 10*3/uL (ref 0.0–0.5)
Eosinophils Relative: 2 %
Immature Granulocytes: 0 %
Lymphocytes Relative: 32 %
Lymphs Abs: 2.1 10*3/uL (ref 0.7–4.0)
Monocytes Absolute: 0.6 10*3/uL (ref 0.1–1.0)
Monocytes Relative: 8 %
Neutro Abs: 3.8 10*3/uL (ref 1.7–7.7)
Neutrophils Relative %: 57 %

## 2020-03-05 LAB — URINALYSIS, ROUTINE W REFLEX MICROSCOPIC
Bilirubin Urine: NEGATIVE
Glucose, UA: NEGATIVE mg/dL
Ketones, ur: NEGATIVE mg/dL
Leukocytes,Ua: NEGATIVE
Nitrite: NEGATIVE
Protein, ur: NEGATIVE mg/dL
Specific Gravity, Urine: 1.025 (ref 1.005–1.030)
pH: 5 (ref 5.0–8.0)

## 2020-03-05 LAB — SARS CORONAVIRUS 2 BY RT PCR (HOSPITAL ORDER, PERFORMED IN ~~LOC~~ HOSPITAL LAB): SARS Coronavirus 2: NEGATIVE

## 2020-03-05 LAB — ETHANOL: Alcohol, Ethyl (B): <10 mg/dL (ref <10)

## 2020-03-05 LAB — RAPID URINE DRUG SCREEN, HOSP PERFORMED
Amphetamines: NOT DETECTED
Barbiturates: NOT DETECTED
Benzodiazepines: POSITIVE — AB
Cocaine: NOT DETECTED
Opiates: NOT DETECTED
Tetrahydrocannabinol: NOT DETECTED

## 2020-03-05 LAB — CBC
HCT: 41.9 % (ref 39.0–52.0)
Hemoglobin: 14 g/dL (ref 13.0–17.0)
MCH: 32.7 pg (ref 26.0–34.0)
MCHC: 33.4 g/dL (ref 30.0–36.0)
MCV: 97.9 fL (ref 80.0–100.0)
Platelets: 158 10*3/uL (ref 150–400)
RBC: 4.28 MIL/uL (ref 4.22–5.81)
RDW: 11.9 % (ref 11.5–15.5)
WBC: 6.6 10*3/uL (ref 4.0–10.5)
nRBC: 0 % (ref 0.0–0.2)

## 2020-03-05 LAB — URINALYSIS, MICROSCOPIC (REFLEX)

## 2020-03-05 LAB — PROTIME-INR
INR: 1.1 (ref 0.8–1.2)
Prothrombin Time: 13.3 seconds (ref 11.4–15.2)

## 2020-03-05 LAB — APTT: aPTT: 28 seconds (ref 24–36)

## 2020-03-05 MED ORDER — SODIUM CHLORIDE 0.9 % IV BOLUS
500.0000 mL | Freq: Once | INTRAVENOUS | Status: AC
  Administered 2020-03-06: 500 mL via INTRAVENOUS

## 2020-03-05 MED ORDER — SERTRALINE HCL 100 MG PO TABS
100.0000 mg | ORAL_TABLET | Freq: Every day | ORAL | Status: DC
  Administered 2020-03-06 – 2020-03-09 (×3): 100 mg via ORAL
  Filled 2020-03-05 (×3): qty 1

## 2020-03-05 MED ORDER — ACETAMINOPHEN 650 MG RE SUPP: 650.0000 mg | RECTAL | Status: DC | PRN

## 2020-03-05 MED ORDER — CLOPIDOGREL BISULFATE 300 MG PO TABS
300.0000 mg | ORAL_TABLET | Freq: Once | ORAL | Status: AC
  Administered 2020-03-05: 300 mg via ORAL
  Filled 2020-03-05: qty 1

## 2020-03-05 MED ORDER — SENNOSIDES-DOCUSATE SODIUM 8.6-50 MG PO TABS: 1.0000 | ORAL_TABLET | Freq: Every evening | ORAL | Status: DC | PRN

## 2020-03-05 MED ORDER — ACETAMINOPHEN 325 MG PO TABS
650.0000 mg | ORAL_TABLET | Freq: Once | ORAL | Status: AC
  Administered 2020-03-05: 650 mg via ORAL
  Filled 2020-03-05: qty 2

## 2020-03-05 MED ORDER — SODIUM CHLORIDE 0.9 % IV SOLN: INTRAVENOUS | Status: DC

## 2020-03-05 MED ORDER — ROSUVASTATIN CALCIUM 20 MG PO TABS
20.0000 mg | ORAL_TABLET | Freq: Every day | ORAL | Status: DC
  Administered 2020-03-05 – 2020-03-09 (×4): 20 mg via ORAL
  Filled 2020-03-05 (×2): qty 1
  Filled 2020-03-05: qty 4
  Filled 2020-03-05: qty 1

## 2020-03-05 MED ORDER — IOHEXOL 350 MG/ML SOLN
80.0000 mL | Freq: Once | INTRAVENOUS | Status: AC | PRN
  Administered 2020-03-05: 80 mL via INTRAVENOUS

## 2020-03-05 MED ORDER — ENOXAPARIN SODIUM 40 MG/0.4ML ~~LOC~~ SOLN
40.0000 mg | SUBCUTANEOUS | Status: DC
  Administered 2020-03-05: 40 mg via SUBCUTANEOUS
  Filled 2020-03-05 (×2): qty 0.4

## 2020-03-05 MED ORDER — CLOPIDOGREL BISULFATE 75 MG PO TABS
75.0000 mg | ORAL_TABLET | Freq: Every day | ORAL | Status: DC
  Administered 2020-03-06 – 2020-03-09 (×3): 75 mg via ORAL
  Filled 2020-03-05 (×3): qty 1

## 2020-03-05 MED ORDER — STROKE: EARLY STAGES OF RECOVERY BOOK
Freq: Once | Status: AC
  Filled 2020-03-05: qty 1

## 2020-03-05 MED ORDER — ASPIRIN EC 81 MG PO TBEC
81.0000 mg | DELAYED_RELEASE_TABLET | Freq: Every day | ORAL | Status: DC
  Administered 2020-03-06 – 2020-03-09 (×3): 81 mg via ORAL
  Filled 2020-03-05 (×3): qty 1

## 2020-03-05 MED ORDER — ACETAMINOPHEN 160 MG/5ML PO SOLN: 650.0000 mg | ORAL | Status: DC | PRN

## 2020-03-05 MED ORDER — ACETAMINOPHEN 325 MG PO TABS
650.0000 mg | ORAL_TABLET | ORAL | Status: DC | PRN
  Administered 2020-03-08: 650 mg via ORAL
  Filled 2020-03-05: qty 2

## 2020-03-05 MED ORDER — ASPIRIN 81 MG PO CHEW
324.0000 mg | CHEWABLE_TABLET | Freq: Once | ORAL | Status: AC
  Administered 2020-03-05: 324 mg via ORAL
  Filled 2020-03-05: qty 4

## 2020-03-05 MED ORDER — LORAZEPAM 2 MG/ML IJ SOLN
1.0000 mg | Freq: Once | INTRAMUSCULAR | Status: AC | PRN
  Administered 2020-03-05: 1 mg via INTRAVENOUS
  Filled 2020-03-05: qty 1

## 2020-03-05 NOTE — ED Provider Notes (Signed)
[facility] EMERGENCY DEPARTMENT Provider Note   CSN: [id number] Arrival date & time: 03/05/20  1131     History Chief Complaint  Patient presents with  . Facial Droop    Joshua Hess is a 79 y.o. male.  HPI 79 year old male presents with right-sided facial droop.  History is primarily from the son at the bedside.  Last seen normal last night.  Patient wakes up earlier than the son and went on a walk and the son did not notice a facial droop when he first saw but did not look in detail at him.  However when he saw him after the walk, he noticed a right-sided facial droop.  No other weakness or numbness.  No vision changes or headache.  Brought the patient in here for evaluation.  On my exam, the facial droop seems minimal and the son states it is improving. Recent carotid ultrasound shows left ICA has 40-59% stenosis.  Past Medical History:  Diagnosis Date  . Aphasia   . BPH (benign prostatic hyperplasia)   . Cancer ([place])    behind ear- [place]  . Carotid artery stenosis   . Chronotropic incompetence   . ED (erectile dysfunction)   . Elevated PSA   . HOH (hard of hearing)   . Mild CAD   . Plantar fasciitis   . Postoperative atrial fibrillation ([place])   . Severe aortic stenosis    a. s/p tissue AVR 10/2018.  Marland Kitchen Sinus bradycardia    baseline  . Wears glasses     Patient Active Problem List   Diagnosis Date Noted  . Facial droop due to acute cerebrovascular accident (CVA) ([place]) 03/05/2020  . Acute encephalopathy 11/18/2019  . Acute urinary retention 11/18/2019  . Post-operative state 11/18/2019  . Aphasia 08/07/2019  . S/P AVR (aortic valve replacement)   . Carotid artery stenosis   . Chronotropic incompetence   . Severe aortic stenosis   . Sinus bradycardia 08/12/2018  . Benign prostatic hyperplasia with nocturia 11/01/2012    Past Surgical History:  Procedure Laterality Date  . AORTIC VALVE REPLACEMENT N/A 11/10/2018   Procedure: AORTIC VALVE  REPLACEMENT (AVR), USING 23MM INSPIRIS;  Surgeon: Gaye Pollack, MD;  Location: [facility];  Service: Open Heart Surgery;  Laterality: N/A;  . EXTERNAL EAR SURGERY     melanoma removed  . HERNIA REPAIR Right    inguinal  . INGUINAL HERNIA REPAIR Right 11/15/2019   Procedure: LAPAROSCOPIC RIGHT INGUINAL HERNIA REPAIR WITH MESH;  Surgeon: Coralie Keens, MD;  Location: [facility];  Service: General;  Laterality: Right;  . RIGHT/LEFT HEART CATH AND CORONARY ANGIOGRAPHY N/A 09/29/2018   Procedure: RIGHT/LEFT HEART CATH AND CORONARY ANGIOGRAPHY;  Surgeon: Burnell Blanks, MD;  Location: [facility];  Service: Cardiovascular;  Laterality: N/A;  . TEE WITHOUT CARDIOVERSION N/A 11/10/2018   Procedure: TRANSESOPHAGEAL ECHOCARDIOGRAM (TEE);  Surgeon: Gaye Pollack, MD;  Location: [facility];  Service: Open Heart Surgery;  Laterality: N/A;  . TRANSCATHETER AORTIC VALVE REPLACEMENT, TRANSFEMORAL     PROCEDURE ATTEMPTED & ABORTED   . UMBILICAL HERNIA REPAIR N/A 11/15/2019   Procedure: UMBILICAL HERNIA REPAIR;  Surgeon: Coralie Keens, MD;  Location: [facility];  Service: General;  Laterality: N/A;       Family History  Problem Relation Age of Onset  . Heart attack Mother   . Other Father        unsure of history  . Heart disease Brother   . Dementia Sister  Social History   Tobacco Use  . Smoking status: Former Smoker    Years: 1.00    Quit date: 1970    Years since quitting: 51.6  . Smokeless tobacco: Never Used  Vaping Use  . Vaping Use: Never used  Substance Use Topics  . Alcohol use: No  . Drug use: No    Home Medications Prior to Admission medications   Medication Sig Start Date End Date Taking? Authorizing Provider  Apoaequorin (PREVAGEN PO) Take 1 tablet by mouth daily.   Yes [provider]  aspirin EC 81 MG EC tablet Take 1 tablet (81 mg total) by mouth daily. 11/15/18  Yes Gold, Wayne E, PA-C  finasteride (PROSCAR) 5 MG tablet Take 1 tablet (5 mg total) by mouth  daily. 11/22/19  Yes Irene Pap N, DO  sertraline (ZOLOFT) 100 MG tablet Take 100 mg by mouth daily.   Yes [provider]  sildenafil (REVATIO) 20 MG tablet Take 20 mg by mouth daily as needed (ED).   Yes [provider]  ALPRAZolam Duanne Moron) 0.5 MG tablet Take 0.5 mg by mouth daily as needed for anxiety.  Patient not taking: Reported on 02/22/2020    [provider]  amoxicillin (AMOXIL) 500 MG capsule TAKE 4 CAPSULES BY MOUTH ONE TO TWO HOURS PRIOR TO DENTAL APPOINTMENT 03/01/20   Burnell Blanks, MD  ampicillin (PRINCIPEN) 500 MG capsule Take 500 mg by mouth 3 (three) times daily.     [provider]  chlorhexidine (PERIDEX) 0.12 % solution 15 mLs by Mouth Rinse route in the morning and at bedtime. 10/24/19   [provider]  ibuprofen (ADVIL) 600 MG tablet Take 600 mg by mouth every 6 (six) hours as needed for pain. 10/24/19   [provider]  Multiple Vitamin (MULTIVITAMIN WITH MINERALS) TABS tablet Take 1 tablet by mouth daily. Patient not taking: Reported on 02/22/2020 11/22/19   Kayleen Memos, DO  polyethylene glycol (MIRALAX / GLYCOLAX) 17 g packet Take 17 g by mouth daily. Patient not taking: Reported on 02/22/2020 11/22/19   Kayleen Memos, DO  rosuvastatin (CRESTOR) 10 MG tablet Take 10 mg by mouth daily. 01/31/19   [provider]  tamsulosin (FLOMAX) 0.4 MG CAPS capsule Take 1 capsule (0.4 mg total) by mouth daily. 11/22/19   Kayleen Memos, DO  traZODone (DESYREL) 50 MG tablet Take 100 mg by mouth at bedtime.     [provider]    Allergies    Patient has no known allergies.  Review of Systems   Review of Systems  Neurological: Negative for speech difficulty, weakness, numbness and headaches.  All other systems reviewed and are negative.   Physical Exam Updated Vital Signs BP (!) 160/76   Pulse (!) 53   Temp 98 F (36.7 C) (Oral)   Resp 15   Ht 5\' 9"  (1.753 m)   Wt 77.6 kg   SpO2 98%   BMI  25.26 kg/m   Physical Exam Vitals and nursing note reviewed.  Constitutional:      General: He is not in acute distress.    Appearance: He is well-developed. He is not ill-appearing or diaphoretic.  HENT:     Head: Normocephalic and atraumatic.     Right Ear: External ear normal.     Left Ear: External ear normal.     Nose: Nose normal.  Eyes:     General:        Right eye: No discharge.  Left eye: No discharge.     Extraocular Movements: Extraocular movements intact.     Pupils: Pupils are equal, round, and reactive to light.  Cardiovascular:     Rate and Rhythm: Normal rate and regular rhythm.     Heart sounds: Normal heart sounds.  Pulmonary:     Effort: Pulmonary effort is normal.     Breath sounds: Normal breath sounds.  Abdominal:     Palpations: Abdomen is soft.     Tenderness: There is no abdominal tenderness.  Musculoskeletal:     Cervical back: Neck supple.  Skin:    General: Skin is warm and dry.  Neurological:     Mental Status: He is alert.     Comments: CN 3-12 grossly intact save for what appears to be very mild right facial droop compared to left (nasolabial fold and corner of the mouth). No forehead weakness or eyelid weakness. 5/5 strength in all 4 extremities. Grossly normal sensation. Normal finger to nose.   Psychiatric:        Mood and Affect: Mood is not anxious.     ED Results / Procedures / Treatments   Labs (all labs ordered are listed, but only abnormal results are displayed) Labs Reviewed  COMPREHENSIVE METABOLIC PANEL - Abnormal; Notable for the following components:      Result Value   Glucose, Bld 108 (*)    Calcium 8.7 (*)    All other components within normal limits  RAPID URINE DRUG SCREEN, HOSP PERFORMED - Abnormal; Notable for the following components:   Benzodiazepines POSITIVE (*)    All other components within normal limits  URINALYSIS, ROUTINE W REFLEX MICROSCOPIC - Abnormal; Notable for the following components:   Hgb  urine dipstick TRACE (*)    All other components within normal limits  URINALYSIS, MICROSCOPIC (REFLEX) - Abnormal; Notable for the following components:   Bacteria, UA RARE (*)    All other components within normal limits  SARS CORONAVIRUS 2 BY RT PCR (HOSPITAL ORDER, [facility])  PROTIME-INR  APTT  CBC  DIFFERENTIAL  ETHANOL    EKG EKG Interpretation  Date/Time:  Tuesday March 05 2020 11:56:13 EDT Ventricular Rate:  58 PR Interval:    QRS Duration: 107 QT Interval:  411 QTC Calculation: 404 R Axis:   63 Text Interpretation: Sinus rhythm Ventricular premature complex Nonspecific T abnormalities, lateral leads Confirmed by Sherwood Gambler [phone]) on 03/05/2020 12:12:03 PM   Radiology CT HEAD WO CONTRAST  Result Date: 03/05/2020 CLINICAL DATA:  Acute neuro deficit. Facial droop today. Dizziness. EXAM: CT HEAD WITHOUT CONTRAST TECHNIQUE: Contiguous axial images were obtained from the base of the skull through the vertex without intravenous contrast. COMPARISON:  CT head 11/18/2019 FINDINGS: Brain: Ventricle size and cerebral volume normal for age. Negative for acute infarct, hemorrhage, mass Vascular: Negative for hyperdense vessel Skull: Negative Sinuses/Orbits: Paranasal sinuses clear.  No orbital lesion. Other: None IMPRESSION: No acute intracranial abnormality.  Negative for age. Electronically Signed   By: Franchot Gallo M.D.   On: 03/05/2020 12:53    Procedures Procedures (including critical care time)  Medications Ordered in ED Medications  aspirin chewable tablet 324 mg (324 mg Oral Given 03/05/20 1450)  clopidogrel (PLAVIX) tablet 300 mg (300 mg Oral Given 03/05/20 1451)    ED Course  I have reviewed the triage vital signs and the nursing notes.  Pertinent labs & imaging results that were available during my care of the patient were reviewed by  me and considered in my medical decision making (see chart for details).    MDM  Rules/Calculators/A&P                          Patient appears to have subtle right-sided facial droop.  Could be improving mild stroke as well.  Neurology was consulted and is recommending aspirin and Plavix load now and then admission for work-up including MRI and MRA.  Discussed with Dr. Lorin Mercy for admission. Final Clinical Impression(s) / ED Diagnoses Final diagnoses:  Acute CVA (cerebrovascular accident) [facility])    Rx / [place] Orders ED Discharge Orders    None       Sherwood Gambler, MD 03/05/20 1510

## 2020-03-05 NOTE — Consult Note (Signed)
[facility] TeleNeurology Consult Services  Stat Consult  Date of Service:   03/05/2020 13:19:39  Impression:     .  I63.00 - Cerebrovascular accident (CVA) due to thrombosis of precerebral artery ([facility])  Comments/Sign-Out: 79 yr old male with sig PMHx of carotid artery stenosis (per son 50-70% via u/s) bradycardia, s/p AVR 10/2018, hard of hearing was in his usual state of health today when he awoke, the then went for his 3 mile walk, (LKN last night) p/w right facial droop. CT head non acute. NIHSS = 1 for right facial droop ...no iv alteplase d/t out of time window...symptoms clinically not suggestive of LVO therefore do not recommend stat neurovascular studies...however concerning for CVA and left hemispheric event ? symptomatic left carotid ... advise admit for CVA/TIA work-up...  CT HEAD: Showed No Acute Hemorrhage or Acute Core Infarct  Metrics: [facility] Notification Time: 03/05/2020 13:17:45 Stamp Time: 03/05/2020 13:19:39 Callback Response Time: 03/05/2020 13:27:16  Our recommendations are outlined below.  Recommendations:     .  Initiate Plavix 75 MG Daily     .  Permissive HTN for 24 hours. Treat if SBP > 220 0r DBP > 110     .  Dual antiplatelet therapy with ASA 81 and Plavix 75 x 21 days (Plavix 300 mg once, start Plavix 75 mg daily tomorrow), unless thrombocytopenic/gi bleed, other contraindications, or findings of cardioembolic event which may warrant anticoagulation  Imaging Studies:     .  MRI Head     .  MRA Head and Neck Without Contrast When Available - Stroke Protocol     .  Echocardiogram - Transthoracic Echocardiogram  Therapies:     .  Physical Therapy, Occupational Therapy, Speech Therapy Assessment When Applicable  Other WorkUp:     .  Check TSH  Disposition: Neurology Follow Up Recommended  Sign Out:     .  Discussed with Emergency Department  Provider  ----------------------------------------------------------------------------------------------------  Chief Complaint: right facial droop  History of Present Illness: Patient is a 79 year old Male.  79 yr old male with sig PMHx of carotid artery stenosis (per son 50-70% via u/s) bradycardia, s/p AVR 10/2018, hard of hearing was in his usual state of health today when he awoke, the then went for his 3 mile walk, (LKN last night) son and patient unsure if he had a facial droop, when he returned that is when his son noticed his facial droop. He takes a ASA 81mg  no prior hx of stroke or TIA. The patient denies new medications, fall, LOC, headache, diplopia, visual loss or change, ,dysphagia, dysarthria, one side weakness, or paresthesias.   Past Medical History:     . There is NO history of Hypertension     . There is NO history of Diabetes Mellitus     . There is NO history of Hyperlipidemia     . There is NO history of Atrial Fibrillation     . There is NO history of Stroke  Anticoagulant use:  No  Antiplatelet use: asa    Examination: BP(137/76), Pulse(51), Blood Glucose(168) 1A: Level of Consciousness - Alert; keenly responsive + 0 1B: Ask Month and Age - Both Questions Right + 0 1C: Blink Eyes & Squeeze Hands - Performs Both Tasks + 0 2: Test Horizontal Extraocular Movements - Normal + 0 3: Test Visual Fields - No Visual Loss + 0 4: Test Facial Palsy (Use Grimace if Obtunded) - Minor paralysis (flat nasolabial fold, smile asymmetry) + 1 5A:  Test Left Arm Motor Drift - No Drift for 10 Seconds + 0 5B: Test Right Arm Motor Drift - No Drift for 10 Seconds + 0 6A: Test Left Leg Motor Drift - No Drift for 5 Seconds + 0 6B: Test Right Leg Motor Drift - No Drift for 5 Seconds + 0 7: Test Limb Ataxia (FNF/Heel-Shin) - No Ataxia + 0 8: Test Sensation - Normal; No sensory loss + 0 9: Test Language/Aphasia - Normal; No aphasia + 0 10: Test Dysarthria - Normal + 0 11: Test  Extinction/Inattention - No abnormality + 0  NIHSS Score: 1   Patient/Family was informed the Neurology Consult would occur via TeleHealth consult by way of interactive audio and video telecommunications and consented to receiving care in this manner.  Patient is being evaluated for possible acute neurologic impairment and high probability of imminent or life-threatening deterioration. I spent total of 20 minutes providing care to this patient, including time for face to face visit via telemedicine, review of medical records, imaging studies and discussion of findings with providers, the patient and/or family.   Dr Annabelle Harman   [facility] [phone]  Case [id number]

## 2020-03-05 NOTE — ED Notes (Signed)
Ambulatory to BR, without assist, gait steady

## 2020-03-05 NOTE — ED Notes (Signed)
Patient transported to CT 

## 2020-03-05 NOTE — H&P (Addendum)
History and Physical    Joshua Hess WVP:[id number] DOB: 03-20-1941 DOA: 03/05/2020  PCP: [name], MD  Patient coming from: [place] [facility]  I have personally briefly reviewed patient's old medical records in [place]  Chief Complaint: Right facial droop  HPI: Joshua Hess is a 79 y.o. male with medical history significant for severe AS s/p tissue AVR April 2020, mild nonobstructive CAD, sinus bradycardia, carotid artery stenosis (8/12 carotid Dopplers show left ICA 40-59%, right ICA 1-39%), BPH, and hard of hearing who presented to [place] ED for evaluation of new onset right facial droop.  History is supplemented by patient's partner at bedside.  Patient was in his usual state of health with LPN approximately 9 PM 03/04/2020.  This morning he woke up and ran 3 miles per his usual daily activities.  When he returned home his partner noticed significant right facial droop.  Patient had associated lightheadedness but denied any other symptoms including dysarthria, nausea, vomiting, numbness/tingling or change in sensation, focal weakness, change in vision, gait instability, chest pain, palpitations, dyspnea, abdominal pain, diarrhea or constipation, or dysuria.  He was taken to [place] ED for further evaluation.  Patient also reports several months of lightheadedness occurring shortly after positional changes.  He has been having feelings of anxiousness and his primary care physician has adjusted his medications with recent initiation of sertraline and as needed diazepam.  Patient has known carotid artery stenosis with carotid Dopplers 8/12 showing left ICA 40-59% stenosis in right ICA 1-39% stenosis.  He has been taking aspirin 81 and mg rosuvastatin 10 mg daily.  He is currently in the process of staged dental implants through [facility].  Context.  He says he does take 2000 mg amoxicillin prior to dental procedures.  [facility] ED Course:  Initial  vitals showed BP 120/66, pulse 56, RR 14, temp 98.0 Fahrenheit, SPO2 98% on room air.  Labs showed WBC 6.6, hemoglobin 14.0, platelets 158,000, sodium 139, potassium 4.3, bicarb 27, BUN 20, creatinine 1.01, serum glucose 108, LFTs within normal limits.  Serum ethanol level <10.  UDS positive for benzodiazepines.  Urinalysis negative for UTI.  SARS-CoV-2 PCR is negative.  CT head without contrast is negative for acute intracranial abnormality.  Teleneurology were consulted and recommended initiating Plavix 75 mg daily with ASA 81 mg DAPT x21 days.  Medical admission for further stroke work-up with MRI head, MRA head/neck, TTE was recommended.  Patient was given 300 mg Plavix load and aspirin 324 mg.  The hospitalist service was consulted for further evaluation and management.  Review of Systems: All systems reviewed and are negative except as documented in history of present illness above.   Past Medical History:  Diagnosis Date  . Aphasia   . BPH (benign prostatic hyperplasia)   . Cancer ([place])    behind ear- [place]  . Carotid artery stenosis   . Chronotropic incompetence   . ED (erectile dysfunction)   . Elevated PSA   . HOH (hard of hearing)   . Mild CAD   . Plantar fasciitis   . Postoperative atrial fibrillation ([place])   . Severe aortic stenosis    a. s/p tissue AVR 10/2018.  Marland Kitchen Sinus bradycardia    baseline  . Wears glasses     Past Surgical History:  Procedure Laterality Date  . AORTIC VALVE REPLACEMENT N/A 11/10/2018   Procedure: AORTIC VALVE REPLACEMENT (AVR), USING 23MM INSPIRIS;  Surgeon: Gaye Pollack,  MD;  Location: [facility] OR;  Service: Open Heart Surgery;  Laterality: N/A;  . EXTERNAL EAR SURGERY     melanoma removed  . HERNIA REPAIR Right    inguinal  . INGUINAL HERNIA REPAIR Right 11/15/2019   Procedure: LAPAROSCOPIC RIGHT INGUINAL HERNIA REPAIR WITH MESH;  Surgeon: Coralie Keens, MD;  Location: [facility];  Service: General;  Laterality: Right;  . RIGHT/LEFT HEART  CATH AND CORONARY ANGIOGRAPHY N/A 09/29/2018   Procedure: RIGHT/LEFT HEART CATH AND CORONARY ANGIOGRAPHY;  Surgeon: Burnell Blanks, MD;  Location: [facility];  Service: Cardiovascular;  Laterality: N/A;  . TEE WITHOUT CARDIOVERSION N/A 11/10/2018   Procedure: TRANSESOPHAGEAL ECHOCARDIOGRAM (TEE);  Surgeon: Gaye Pollack, MD;  Location: [facility];  Service: Open Heart Surgery;  Laterality: N/A;  . TRANSCATHETER AORTIC VALVE REPLACEMENT, TRANSFEMORAL     PROCEDURE ATTEMPTED & ABORTED   . UMBILICAL HERNIA REPAIR N/A 11/15/2019   Procedure: UMBILICAL HERNIA REPAIR;  Surgeon: Coralie Keens, MD;  Location: [place];  Service: General;  Laterality: N/A;    Social History:  reports that he quit smoking about 51 years ago. He quit after 1.00 year of use. He has never used smokeless tobacco. He reports that he does not drink alcohol and does not use drugs.  No Known Allergies  Family History  Problem Relation Age of Onset  . Heart attack Mother   . Other Father        unsure of history  . Heart disease Brother   . Dementia Sister      Prior to Admission medications   Medication Sig Start Date End Date Taking? Authorizing Provider  Apoaequorin (PREVAGEN PO) Take 1 tablet by mouth daily.   Yes [provider]  aspirin EC 81 MG EC tablet Take 1 tablet (81 mg total) by mouth daily. 11/15/18  Yes Gold, Wayne E, PA-C  finasteride (PROSCAR) 5 MG tablet Take 1 tablet (5 mg total) by mouth daily. 11/22/19  Yes Irene Pap N, DO  sertraline (ZOLOFT) 100 MG tablet Take 100 mg by mouth daily.   Yes [provider]  sildenafil (REVATIO) 20 MG tablet Take 20 mg by mouth daily as needed (ED).   Yes [provider]  ALPRAZolam Duanne Moron) 0.5 MG tablet Take 0.5 mg by mouth daily as needed for anxiety.  Patient not taking: Reported on 02/22/2020    [provider]  amoxicillin (AMOXIL) 500 MG capsule TAKE 4 CAPSULES BY MOUTH ONE TO TWO HOURS PRIOR TO DENTAL APPOINTMENT  03/01/20   Burnell Blanks, MD  ampicillin (PRINCIPEN) 500 MG capsule Take 500 mg by mouth 3 (three) times daily.     [provider]  chlorhexidine (PERIDEX) 0.12 % solution 15 mLs by Mouth Rinse route in the morning and at bedtime. 10/24/19   [provider]  ibuprofen (ADVIL) 600 MG tablet Take 600 mg by mouth every 6 (six) hours as needed for pain. 10/24/19   [provider]  Multiple Vitamin (MULTIVITAMIN WITH MINERALS) TABS tablet Take 1 tablet by mouth daily. Patient not taking: Reported on 02/22/2020 11/22/19   Kayleen Memos, DO  polyethylene glycol (MIRALAX / GLYCOLAX) 17 g packet Take 17 g by mouth daily. Patient not taking: Reported on 02/22/2020 11/22/19   Kayleen Memos, DO  rosuvastatin (CRESTOR) 10 MG tablet Take 10 mg by mouth daily. 01/31/19   [provider]  tamsulosin (FLOMAX) 0.4 MG CAPS capsule Take 1 capsule (0.4 mg total) by mouth daily. 11/22/19  Irene Pap N, DO  traZODone (DESYREL) 50 MG tablet Take 100 mg by mouth at bedtime.     [provider]    Physical Exam: Vitals:   03/05/20 1600 03/05/20 1630 03/05/20 1700 03/05/20 1908  BP: (!) 167/79 (!) 166/86 (!) 167/78 (!) 149/79  Pulse: (!) 56 (!) 57 (!) 54 (!) 49  Resp: 14 12 (!) 8 12  Temp:    97.7 F (36.5 C)  TempSrc:    Oral  SpO2: 99% 99% 100% 98%  Weight:      Height:       Constitutional: Resting supine in bed, NAD, calm, comfortable Eyes: PERRL, lids and conjunctivae normal ENMT: Mucous membranes are moist. Posterior pharynx clear of any exudate or lesions.somewhat hard of hearing. Neck: normal, supple, no masses. Respiratory: clear to auscultation bilaterally, no wheezing, no crackles. Normal respiratory effort. No accessory muscle use.  Cardiovascular: Bradycardic with regular rhythm, systolic murmur present. No extremity edema. 2+ pedal pulses. Abdomen: no tenderness, no masses palpated. No hepatosplenomegaly. Bowel sounds positive.  Musculoskeletal:  no clubbing / cyanosis. No joint deformity upper and lower extremities. Good ROM, no contractures. Normal muscle tone.  Skin: no rashes, lesions, ulcers. No induration Neurologic: Subtle right facial droop at the mouth otherwise CN 2-12 grossly intact.  Slight tremor of RUE with extension and supinated position and improved with pronation, sensation intact,  Strength 5/5 in all 4.  Psychiatric: Normal judgment and insight. Alert and oriented x 3. Normal mood.   Labs on Admission: I have personally reviewed following labs and imaging studies  CBC: Recent Labs  Lab 03/05/20 1209  WBC 6.6  NEUTROABS 3.8  HGB 14.0  HCT 41.9  MCV 97.9  PLT 154   Basic Metabolic Panel: Recent Labs  Lab 03/05/20 1209  NA 139  K 4.3  CL 103  CO2 27  GLUCOSE 108*  BUN 20  CREATININE 1.01  CALCIUM 8.7*   GFR: Estimated Creatinine Clearance: 59.3 mL/min (by C-G formula based on SCr of 1.01 mg/dL). Liver Function Tests: Recent Labs  Lab 03/05/20 1209  AST 22  ALT 21  ALKPHOS 45  BILITOT 0.6  PROT 6.5  ALBUMIN 3.7   No results for input(s): LIPASE, AMYLASE in the last 168 hours. No results for input(s): AMMONIA in the last 168 hours. Coagulation Profile: Recent Labs  Lab 03/05/20 1209  INR 1.1   Cardiac Enzymes: No results for input(s): CKTOTAL, CKMB, CKMBINDEX, TROPONINI in the last 168 hours. BNP (last 3 results) No results for input(s): PROBNP in the last 8760 hours. HbA1C: No results for input(s): HGBA1C in the last 72 hours. CBG: No results for input(s): GLUCAP in the last 168 hours. Lipid Profile: No results for input(s): CHOL, HDL, LDLCALC, TRIG, CHOLHDL, LDLDIRECT in the last 72 hours. Thyroid Function Tests: No results for input(s): TSH, T4TOTAL, FREET4, T3FREE, THYROIDAB in the last 72 hours. Anemia Panel: No results for input(s): VITAMINB12, FOLATE, FERRITIN, TIBC, IRON, RETICCTPCT in the last 72 hours. Urine analysis:    Component Value Date/Time   COLORURINE YELLOW  03/05/2020 1350   APPEARANCEUR CLEAR 03/05/2020 1350   LABSPEC 1.025 03/05/2020 1350   PHURINE 5.0 03/05/2020 1350   GLUCOSEU NEGATIVE 03/05/2020 1350   HGBUR TRACE (A) 03/05/2020 1350   BILIRUBINUR NEGATIVE 03/05/2020 [place] 03/05/2020 1350   PROTEINUR NEGATIVE 03/05/2020 1350   NITRITE NEGATIVE 03/05/2020 1350   LEUKOCYTESUR NEGATIVE 03/05/2020 1350    Radiological Exams on Admission: [place]  Result Date: 03/05/2020 CLINICAL DATA:  Acute neuro deficit. Facial droop today. Dizziness. EXAM: CT HEAD WITHOUT CONTRAST TECHNIQUE: Contiguous axial images were obtained from the base of the skull through the vertex without intravenous contrast. COMPARISON:  CT head 11/18/2019 FINDINGS: Brain: Ventricle size and cerebral volume normal for age. Negative for acute infarct, hemorrhage, mass Vascular: Negative for hyperdense vessel Skull: Negative Sinuses/Orbits: Paranasal sinuses clear.  No orbital lesion. Other: None IMPRESSION: No acute intracranial abnormality.  Negative for age. Electronically Signed   By: Franchot Gallo M.D.   On: 03/05/2020 12:53    EKG: Independently reviewed. Sinus bradycardia with PVCs.  Rate is slower when compared to prior.  Assessment/Plan Principal Problem:   Facial droop Active Problems:   Benign prostatic hyperplasia with nocturia   Sinus bradycardia   Carotid artery stenosis   S/P AVR (aortic valve replacement)   Mild CAD  Joshua Hess is a 79 y.o. male with medical history significant for severe AS s/p tissue AVR April 2020, mild nonobstructive CAD, sinus bradycardia, carotid artery stenosis (8/12 carotid Dopplers show left ICA 40-59%, right ICA 1-39%), BPH, and hard of hearing who is admitted with right facial droop for stroke work-up.  Acute right facial droop: Admitted for stroke evaluation.  CT head is negative.  Teleneurology was consulted and patient has been loaded with Plavix 300 mg and aspirin 324 mg.  He takes aspirin  81 mg and rosuvastatin 10 mg daily as an outpatient.  Discussed with on-call neurology, Dr. Rory Percy who recommends obtaining CTA head and neck instead of MRI at this time. -MRI brain -CTA head and neck -Echocardiogram -Monitor on telemetry, continue neurochecks -Continue with DAPT aspirin 81 mg daily Plavix 75 mg daily beginning tomorrow -Increase home rosuvastatin to 20 mg daily -Allow permissive hypertension up to 220/110 -Check A1c, lipid panel -PT/OT/SLP eval -Neurology to follow, appreciate assistance  Orthostatic symptoms: -Check orthostatic vitals -Hold home finasteride for now  Carotid artery stenosis: Follows with vascular surgery, Dr. Oneida Alar. 8/12 carotid Dopplers show left ICA stenosis 40-59%, right ICA 1-39%.  Follow-up MRI studies as above.  Continue aspirin, Plavix, rosuvastatin as above.  Sinus bradycardia: Chronic and at patient's baseline.  He is an avid Production designer, theatre/television/film.  Mild CAD Severe AS s/p tissue AVR: Chronic and stable.  Follows with cardiology, Dr. Angelena Form cardiothoracic surgery Dr. Cyndia Bent. -Continue aspirin, Plavix, rosuvastatin as above  BPH: Holding home finasteride for now as above.  Depression/anxiety: Continue sertraline.  DVT prophylaxis: Lovenox Code Status: DNR, confirmed with patient Family Communication: Discussed with patient's significant other at bedside Disposition Plan: From home likely discharge to home pending further stroke work-up Consults called: Neurology Admission status:  Status is: Observation  The patient remains OBS appropriate and will d/c before 2 midnights.  Dispo: The patient is from: Home              Anticipated d/c is to: Home              Anticipated d/c date is: 1 day              Patient currently is not medically stable to d/c.   Zada Finders MD [facility]  If 7PM-7AM, please contact night-coverage [email or web address]  03/05/2020, 9:17 PM

## 2020-03-05 NOTE — Progress Notes (Signed)
Patient with h/o afib; AS with failed TAVR due to aortic thrombus so had AVR instead; CAD; carotid stenosis; and melanoma presenting to [facility] with R facial droop under eye and on lip.  Concerning for mild CVA.  Has L ICA stenosis.  Teleneurology has recommended DAPT and MRI/MRA with possible vascular surgery consult.  Will place order for observation at [facility] on telemetry.   Carlyon Shadow, M.D.

## 2020-03-05 NOTE — ED Triage Notes (Signed)
Right facial droop. Last seen normal at 9pm. He is ambulatory.

## 2020-03-05 NOTE — ED Notes (Signed)
Report given to Melissa RN

## 2020-03-06 ENCOUNTER — Encounter (HOSPITAL_COMMUNITY): Payer: Self-pay | Admitting: Internal Medicine

## 2020-03-06 ENCOUNTER — Observation Stay (HOSPITAL_BASED_OUTPATIENT_CLINIC_OR_DEPARTMENT_OTHER): Payer: Medicare HMO

## 2020-03-06 DIAGNOSIS — R29810 Facial weakness: Secondary | ICD-10-CM | POA: Diagnosis not present

## 2020-03-06 DIAGNOSIS — Z20822 Contact with and (suspected) exposure to covid-19: Secondary | ICD-10-CM | POA: Diagnosis present

## 2020-03-06 DIAGNOSIS — I69392 Facial weakness following cerebral infarction: Secondary | ICD-10-CM

## 2020-03-06 DIAGNOSIS — I2510 Atherosclerotic heart disease of native coronary artery without angina pectoris: Secondary | ICD-10-CM | POA: Diagnosis not present

## 2020-03-06 DIAGNOSIS — I951 Orthostatic hypotension: Secondary | ICD-10-CM | POA: Diagnosis not present

## 2020-03-06 DIAGNOSIS — Z952 Presence of prosthetic heart valve: Secondary | ICD-10-CM | POA: Diagnosis not present

## 2020-03-06 DIAGNOSIS — I6523 Occlusion and stenosis of bilateral carotid arteries: Secondary | ICD-10-CM | POA: Diagnosis not present

## 2020-03-06 DIAGNOSIS — R29700 NIHSS score 0: Secondary | ICD-10-CM | POA: Diagnosis present

## 2020-03-06 DIAGNOSIS — Z01810 Encounter for preprocedural cardiovascular examination: Secondary | ICD-10-CM

## 2020-03-06 DIAGNOSIS — I361 Nonrheumatic tricuspid (valve) insufficiency: Secondary | ICD-10-CM | POA: Diagnosis not present

## 2020-03-06 DIAGNOSIS — H9190 Unspecified hearing loss, unspecified ear: Secondary | ICD-10-CM | POA: Diagnosis present

## 2020-03-06 DIAGNOSIS — E785 Hyperlipidemia, unspecified: Secondary | ICD-10-CM | POA: Diagnosis present

## 2020-03-06 DIAGNOSIS — I493 Ventricular premature depolarization: Secondary | ICD-10-CM | POA: Diagnosis present

## 2020-03-06 DIAGNOSIS — N401 Enlarged prostate with lower urinary tract symptoms: Secondary | ICD-10-CM

## 2020-03-06 DIAGNOSIS — R001 Bradycardia, unspecified: Secondary | ICD-10-CM | POA: Diagnosis not present

## 2020-03-06 DIAGNOSIS — Z85820 Personal history of malignant melanoma of skin: Secondary | ICD-10-CM | POA: Diagnosis not present

## 2020-03-06 DIAGNOSIS — R351 Nocturia: Secondary | ICD-10-CM | POA: Diagnosis present

## 2020-03-06 DIAGNOSIS — Z87891 Personal history of nicotine dependence: Secondary | ICD-10-CM | POA: Diagnosis not present

## 2020-03-06 DIAGNOSIS — Z79899 Other long term (current) drug therapy: Secondary | ICD-10-CM | POA: Diagnosis not present

## 2020-03-06 DIAGNOSIS — I340 Nonrheumatic mitral (valve) insufficiency: Secondary | ICD-10-CM | POA: Diagnosis not present

## 2020-03-06 DIAGNOSIS — Z8673 Personal history of transient ischemic attack (TIA), and cerebral infarction without residual deficits: Secondary | ICD-10-CM | POA: Diagnosis not present

## 2020-03-06 DIAGNOSIS — D696 Thrombocytopenia, unspecified: Secondary | ICD-10-CM | POA: Diagnosis not present

## 2020-03-06 DIAGNOSIS — I6522 Occlusion and stenosis of left carotid artery: Secondary | ICD-10-CM | POA: Diagnosis not present

## 2020-03-06 DIAGNOSIS — F419 Anxiety disorder, unspecified: Secondary | ICD-10-CM | POA: Diagnosis present

## 2020-03-06 DIAGNOSIS — I639 Cerebral infarction, unspecified: Secondary | ICD-10-CM | POA: Diagnosis present

## 2020-03-06 DIAGNOSIS — I10 Essential (primary) hypertension: Secondary | ICD-10-CM | POA: Diagnosis present

## 2020-03-06 DIAGNOSIS — Z8249 Family history of ischemic heart disease and other diseases of the circulatory system: Secondary | ICD-10-CM | POA: Diagnosis not present

## 2020-03-06 DIAGNOSIS — Z7982 Long term (current) use of aspirin: Secondary | ICD-10-CM | POA: Diagnosis not present

## 2020-03-06 DIAGNOSIS — Z66 Do not resuscitate: Secondary | ICD-10-CM | POA: Diagnosis present

## 2020-03-06 DIAGNOSIS — I63032 Cerebral infarction due to thrombosis of left carotid artery: Secondary | ICD-10-CM | POA: Diagnosis present

## 2020-03-06 DIAGNOSIS — I491 Atrial premature depolarization: Secondary | ICD-10-CM | POA: Diagnosis present

## 2020-03-06 DIAGNOSIS — F329 Major depressive disorder, single episode, unspecified: Secondary | ICD-10-CM | POA: Diagnosis present

## 2020-03-06 LAB — LIPID PANEL
Cholesterol: 109 mg/dL (ref 0–200)
HDL: 47 mg/dL (ref >40)
LDL Cholesterol: 52 mg/dL (ref 0–99)
Total CHOL/HDL Ratio: 2.3 RATIO
Triglycerides: 49 mg/dL (ref <150)
VLDL: 10 mg/dL (ref 0–40)

## 2020-03-06 LAB — BASIC METABOLIC PANEL
Anion gap: 7 (ref 5–15)
BUN: 18 mg/dL (ref 8–23)
CO2: 28 mmol/L (ref 22–32)
Calcium: 8.7 mg/dL — ABNORMAL LOW (ref 8.9–10.3)
Chloride: 104 mmol/L (ref 98–111)
Creatinine, Ser: 0.86 mg/dL (ref 0.61–1.24)
GFR calc Af Amer: >60 mL/min (ref >60)
GFR calc non Af Amer: >60 mL/min (ref >60)
Glucose, Bld: 76 mg/dL (ref 70–99)
Potassium: 4 mmol/L (ref 3.5–5.1)
Sodium: 139 mmol/L (ref 135–145)

## 2020-03-06 LAB — CBC
HCT: 42.9 % (ref 39.0–52.0)
Hemoglobin: 14.5 g/dL (ref 13.0–17.0)
MCH: 32.7 pg (ref 26.0–34.0)
MCHC: 33.8 g/dL (ref 30.0–36.0)
MCV: 96.6 fL (ref 80.0–100.0)
Platelets: 162 10*3/uL (ref 150–400)
RBC: 4.44 MIL/uL (ref 4.22–5.81)
RDW: 11.8 % (ref 11.5–15.5)
WBC: 6.7 10*3/uL (ref 4.0–10.5)
nRBC: 0 % (ref 0.0–0.2)

## 2020-03-06 LAB — ECHOCARDIOGRAM COMPLETE
AR max vel: 1.27 cm2
AV Area VTI: 1.4 cm2
AV Area mean vel: 1.4 cm2
AV Mean grad: 17 mmHg
AV Peak grad: 32.7 mmHg
Ao pk vel: 2.86 m/s
Area-P 1/2: 1.48 cm2
Height: 69 in
S' Lateral: 3.65 cm
Weight: 2737.23 oz

## 2020-03-06 LAB — TSH: TSH: 3.435 u[IU]/mL (ref 0.350–4.500)

## 2020-03-06 LAB — HEMOGLOBIN A1C
Hgb A1c MFr Bld: 5.2 % (ref 4.8–5.6)
Mean Plasma Glucose: 102.54 mg/dL

## 2020-03-06 MED ORDER — DIAZEPAM 2 MG PO TABS
5.0000 mg | ORAL_TABLET | Freq: Two times a day (BID) | ORAL | Status: DC | PRN
  Administered 2020-03-06 – 2020-03-08 (×2): 5 mg via ORAL
  Filled 2020-03-06: qty 3
  Filled 2020-03-06: qty 1

## 2020-03-06 MED ORDER — TRAZODONE HCL 50 MG PO TABS
50.0000 mg | ORAL_TABLET | Freq: Every day | ORAL | Status: DC
  Administered 2020-03-07 – 2020-03-08 (×2): 100 mg via ORAL
  Filled 2020-03-06 (×3): qty 2

## 2020-03-06 NOTE — Consult Note (Signed)
Cardiology Consultation:   Patient ID: Joshua Hess MRN: [id number]; DOB: May 24, 1941  Admit date: 03/05/2020 Date of Consult: 03/06/2020  Primary Care Provider: [name], MD [facility] Cardiologist: Lauree Chandler, MD  [facility] Electrophysiologist:  None    Patient Profile:   Joshua Hess is a 79 y.o. male with a history of mild non-obstructive CAD on cardiac cath in 09/2018, severe aortic stenosis s/p surgical AVR in 10/2018, brief post-op atrial fibrillation not on anticoagulation, sinus bradycardia, bilateral carotid artery stenosis with 1-39% stenosis of right ICA and 40-59% stenosis of left ICA on recent dopplers on 02/22/2020, BPH who is being seen today for pre-op evaluation for CEA vs TCAR at the request of Karoline Caldwell, PA-C with Vascular Surgery.  History of Present Illness:   Joshua Hess is a 79 year old male with the above history. Patient has history of severe aortic stenosis. He underwent right/left cardiac catheterization in 09/2018 for AVR work-up which showed mild non-obstructive CAD. Patient was initially worked up for TAVR but a mobile mass was seen in the aortic sinus so TAVR was cancelled. Patient ultimately underwent surgical AVR on 11/10/2018 with Dr. Cyndia Bent. 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 He had brief run of post-op atrial fibrillation and was initially started on Amiodarone in addition to Metoprolol. Amiodarone had to be stopped due to nausea. He continued to struggle with nausea and fatigue post op so Metoprolol also had to be stopped. Echo in 01/2019 showed LVEF of 50-55%. Bioprosthetic AVR was working well with mean gradient of 16 mmHg. Patient was last seen by Dr. Angelena Form in 10/2019 at which time he was doing well. He was running again and denied any cardiac symptoms. He was felt to be at acceptable risk for upcoming dental procedure and hernia surgery. Patient has known bilateral carotid  artery stenosis - he had an episode of aphasia about 1.5 years ago. Has been followed by Vascular Surgery since that time. Recent carotid artery dopplers on 02/22/2020 showed 1-39% stenosis of right ICA and 40-59% stenosis of left ICA   Patient presented to the [facility] ED yesterday for further evaluation of facial droop. Patient in his usual state of health until yesterday. He went for his usual 3-4 miles walk/run yesterday morning and when he came back, his partner noticed that he had a right sided facial droop. No other stroke like symptoms. Therefore, he presented to the ED for further evaluation. No cardiac symptoms. No chest pain, shortness of breath, orthopnea, PND, or edema. No palpitations. He does not some lightheadedness/dizziness especially if he bends over but no near syncope or syncope. Also notes feeling a little unsteady on his feet recently. No recent fevers or illnesses. No GI symptoms. No abnormal bleeding in urine or stools.  In the ED, patiently hypertensive at times with BP as hight as 187/89. EKG showed sinus bradycardia, rate 68 bpm, with PVC and non-specific ST/T changes. Head CT showed no acute findings. WBC 6.6, Hgb 14.0, Plts 158. Na 139, K 4.3, Glucose 108, BUN 20, Cr 1.01. AST 22, ALT 21, Alk Phos 45. Total Bili 0.6. Urine drug screen positive for benzodiazepines (prescribed Diazepam as needed for anxiety). COVID -19 negative.   Patient admitted for furher work-up of stroke. Neurology recommended starting DAPT with Plavix and Aspirin. Brain MRI showed no generalized age-related cerebral atrophy and mild chronic small vessel ischemic disease but no acute findings. Head/neck CTA negative for emergent  large vessel occlusion but did show 75% atheromatous stenosis about the left carotid bifurcation, 50% stenosis at the origin of the right subclavian artery, and approximately 50% stenosis at the origin of the left vertebral artery. Echo showed LVEF of 50-55% with normal wall  motion, mild LVH, grade 1 diastolic dysfunction, mild MR. Bioprosthetic AVR with normal structure and function. Vascular surgery was consulted and reports patient will need CEA vs TCAR over the next couple of weeks. Cardiology consulted for pre-op evaluation.   At the time of this evaluation, patient resting comfortably. He is very anxious being in the hospital and has tremor of right hand. Still has slight right sided facial droop around mouth and reports having a "stress headache." No cardiac complaints at this time.   Past Medical History:  Diagnosis Date  . Aphasia   . BPH (benign prostatic hyperplasia)   . Cancer ([place])    behind ear- [place]  . Carotid artery stenosis   . Chronotropic incompetence   . ED (erectile dysfunction)   . Elevated PSA   . HOH (hard of hearing)   . Mild CAD   . Plantar fasciitis   . Postoperative atrial fibrillation ([place])   . Severe aortic stenosis    a. s/p tissue AVR 10/2018.  Marland Kitchen Sinus bradycardia    baseline  . Wears glasses     Past Surgical History:  Procedure Laterality Date  . AORTIC VALVE REPLACEMENT N/A 11/10/2018   Procedure: AORTIC VALVE REPLACEMENT (AVR), USING 23MM INSPIRIS;  Surgeon: Gaye Pollack, MD;  Location: [place];  Service: Open Heart Surgery;  Laterality: N/A;  . EXTERNAL EAR SURGERY     melanoma removed  . HERNIA REPAIR Right    inguinal  . INGUINAL HERNIA REPAIR Right 11/15/2019   Procedure: LAPAROSCOPIC RIGHT INGUINAL HERNIA REPAIR WITH MESH;  Surgeon: Coralie Keens, MD;  Location: [place];  Service: General;  Laterality: Right;  . RIGHT/LEFT HEART CATH AND CORONARY ANGIOGRAPHY N/A 09/29/2018   Procedure: RIGHT/LEFT HEART CATH AND CORONARY ANGIOGRAPHY;  Surgeon: Burnell Blanks, MD;  Location: [place] [facility];  Service: Cardiovascular;  Laterality: N/A;  . TEE WITHOUT CARDIOVERSION N/A 11/10/2018   Procedure: TRANSESOPHAGEAL ECHOCARDIOGRAM (TEE);  Surgeon: Gaye Pollack, MD;  Location: [place];  Service: Open Heart  Surgery;  Laterality: N/A;  . TRANSCATHETER AORTIC VALVE REPLACEMENT, TRANSFEMORAL     PROCEDURE ATTEMPTED & ABORTED   . UMBILICAL HERNIA REPAIR N/A 11/15/2019   Procedure: UMBILICAL HERNIA REPAIR;  Surgeon: Coralie Keens, MD;  Location: [place];  Service: General;  Laterality: N/A;     Home Medications:  Prior to Admission medications   Medication Sig Start Date End Date Taking? Authorizing Provider  amoxicillin (AMOXIL) 500 MG capsule TAKE 4 CAPSULES BY MOUTH ONE TO TWO HOURS PRIOR TO DENTAL APPOINTMENT Patient taking differently: Take 2,000 mg by mouth See admin instructions. TAKE 4 CAPSULES BY MOUTH ONE TO TWO HOURS PRIOR TO DENTAL APPOINTMENT 03/01/20  Yes Burnell Blanks, MD  Apoaequorin (PREVAGEN) 10 MG CAPS Take 10 mg by mouth daily.    Yes [provider]  aspirin EC 81 MG EC tablet Take 1 tablet (81 mg total) by mouth daily. 11/15/18  Yes Gold, Wayne E, PA-C  diazepam (VALIUM) 5 MG tablet Take 5 mg by mouth 2 (two) times daily as needed for anxiety.   Yes [provider]  finasteride (PROSCAR) 5 MG tablet Take 1 tablet (5 mg total) by mouth daily. 11/22/19  Yes Irene Pap  N, DO  polyethylene glycol (MIRALAX / GLYCOLAX) 17 g packet Take 17 g by mouth daily. Patient taking differently: Take 17 g by mouth daily as needed for mild constipation.  11/22/19  Yes Kayleen Memos, DO  rosuvastatin (CRESTOR) 10 MG tablet Take 10 mg by mouth in the morning.  01/31/19  Yes [provider]  sertraline (ZOLOFT) 100 MG tablet Take 100 mg by mouth daily.   Yes [provider]  sildenafil (REVATIO) 20 MG tablet Take 20 mg by mouth daily as needed (ED).   Yes [provider]  traZODone (DESYREL) 100 MG tablet Take 50-100 mg by mouth at bedtime.    Yes [provider]  ALPRAZolam Duanne Moron) 0.5 MG tablet Take 0.5 mg by mouth daily as needed for anxiety.  Patient not taking: Reported on 02/22/2020    [provider]  Multiple Vitamin  (MULTIVITAMIN WITH MINERALS) TABS tablet Take 1 tablet by mouth daily. Patient not taking: Reported on 02/22/2020 11/22/19   Kayleen Memos, DO    Inpatient Medications: Scheduled Meds: . aspirin EC  81 mg Oral Daily  . clopidogrel  75 mg Oral Daily  . enoxaparin (LOVENOX) injection  40 mg Subcutaneous Q24H  . rosuvastatin  20 mg Oral Daily  . sertraline  100 mg Oral Daily   Continuous Infusions:  PRN Meds: acetaminophen **OR** acetaminophen (TYLENOL) oral liquid 160 mg/5 mL **OR** acetaminophen, senna-docusate  Allergies:    Allergies  Allergen Reactions  . Flomax [Tamsulosin] Other (See Comments)    Made feel foggy headed    Social History:   Social History   Socioeconomic History  . Marital status: Married    Spouse name: Not on file  . Number of children: 0  . Years of education: college  . Highest education level: Bachelor's degree (e.g., BA, AB, BS)  Occupational History  . Occupation: Retired-Human Resources  Tobacco Use  . Smoking status: Former Smoker    Years: 1.00    Quit date: 1970    Years since quitting: 51.6  . Smokeless tobacco: Never Used  Vaping Use  . Vaping Use: Never used  Substance and Sexual Activity  . Alcohol use: No  . Drug use: No  . Sexual activity: Not on file  Other Topics Concern  . Not on file  Social History Narrative   Caffeine: 2 cups daily   Right-handed.   Lives with spouse.   Social Determinants of Health   Financial Resource Strain:   . Difficulty of Paying Living Expenses: Not on file  Food Insecurity:   . Worried About Charity fundraiser in the Last Year: Not on file  . Ran Out of Food in the Last Year: Not on file  Transportation Needs:   . Lack of Transportation (Medical): Not on file  . Lack of Transportation (Non-Medical): Not on file  Physical Activity:   . Days of Exercise per Week: Not on file  . Minutes of Exercise per Session: Not on file  Stress:   . Feeling of Stress : Not on file  Social  Connections:   . Frequency of Communication with Friends and Family: Not on file  . Frequency of Social Gatherings with Friends and Family: Not on file  . Attends Religious Services: Not on file  . Active Member of Clubs or Organizations: Not on file  . Attends Archivist Meetings: Not on file  . Marital Status: Not on file  Intimate Partner Violence:   .  Fear of Current or Ex-Partner: Not on file  . Emotionally Abused: Not on file  . Physically Abused: Not on file  . Sexually Abused: Not on file    Family History:    Family History  Problem Relation Age of Onset  . Heart attack Mother   . Other Father        unsure of history  . Heart disease Brother   . Dementia Sister      ROS:  Please see the history of present illness.  All other ROS reviewed and negative.     Physical Exam/Data:   Vitals:   03/06/20 0106 03/06/20 0316 03/06/20 0753 03/06/20 1119  BP: (!) 143/98 140/73 (!) 141/72 125/82  Pulse: (!) 59 (!) 50 (!) 48 (!) 49  Resp: '18 16 18 16  ' Temp: 97.7 F (36.5 C) 98 F (36.7 C) (!) 97.5 F (36.4 C) 98.1 F (36.7 C)  TempSrc: Oral  Oral Oral  SpO2: 97% 97% 100% 97%  Weight:      Height:        Intake/Output Summary (Last 24 hours) at 03/06/2020 1451 Last data filed at 03/06/2020 0316 Gross per 24 hour  Intake -  Output 100 ml  Net -100 ml   Last 3 Weights 03/05/2020 02/22/2020 12/13/2019  Weight (lbs) 171 lb 1.2 oz 171 lb 169 lb  Weight (kg) 77.6 kg 77.565 kg 76.658 kg     Body mass index is 25.26 kg/m.  General: 79 y.o. male resting comfortably in no acute distress.  HEENT: Normocephalic and atraumatic. Sclera clear. Neck: Supple. No JVD. Heart: Irregular with normal rate. Distinct S1 and S2. II/VI systolic murmur. No gallops or rubs. Radial and distal pedal pulses 2+ and equal bilaterally. Lungs: No increased work of breathing. Clear to ausculation bilaterally. No wheezes, rhonchi, or rales.  Abdomen: Soft, non-distended, and non-tender to  palpation. Bowel sounds present. MSK: Normal strength and tone for age. Extremities: No lower extremity edema.    Skin: Warm and dry. Neuro: Alert and oriented x3. Mild right sided facial droop around mouth. Psych: Normal affect. Responds appropriately.    EKG:  The EKG was personally reviewed and demonstrates: Normal sinus rhythm, rate 58 bpm, with PVC and non-specific ST/T changes.  Telemetry:  Telemetry was personally reviewed and demonstrates: Normal sinus rhythm with frequent PVCs and PACs, sometimes in a bigeminy pattern. Rates in the 50's to 60's.  Relevant CV Studies:  Right/Left Heart Catheterization 09/29/2018:  Prox RCA to Mid RCA lesion is 20% stenosed.  Mid RCA lesion is 30% stenosed.  Ost Cx to Prox Cx lesion is 30% stenosed.  Prox LAD lesion is 10% stenosed.  Mid LAD lesion is 20% stenosed.  Mid LAD to Dist LAD lesion is 20% stenosed.   1. Mild non-obstructive CAD 2. Severe aortic stenosis by echo (by cath mean gradient 17. 73mHg and peak to peak gradient 24 mmHg)  Recommendations: Continue workup for TAVR _______________  Echocardiogram 03/06/2020: Impressions: 1. Left ventricular ejection fraction, by estimation, is 50 to 55%. The  left ventricle has low normal function. The left ventricle has no regional  wall motion abnormalities. There is mild concentric left ventricular  hypertrophy. Left ventricular  diastolic parameters are consistent with Grade I diastolic dysfunction  (impaired relaxation).  2. Right ventricular systolic function is low normal. The right  ventricular size is mildly enlarged. There is normal pulmonary artery  systolic pressure. The estimated right ventricular systolic pressure is  249.1mmHg.  3. Left  atrial size was mild to moderately dilated.  4. Right atrial size was mildly dilated.  5. The mitral valve is grossly normal. Mild mitral valve regurgitation.  No evidence of mitral stenosis.  6. 23 mm surgical prosthetic  valve present with V max 2.8 m/s, MG 17  mmHG, EOA 1.40 cm2. The aortic valve has been repaired/replaced. Aortic  valve regurgitation is not visualized. No aortic stenosis is present.  There is a 23 mm bioprosthetic valve  present in the aortic position. Procedure Date: 11/10/2018. Echo findings  are consistent with normal structure and function of the aortic valve  prosthesis.  7. The inferior vena cava is normal in size with greater than 50%  respiratory variability, suggesting right atrial pressure of 3 mmHg.   Comparison(s): No significant change from prior study.   Conclusion(s)/Recommendation(s): No intracardiac source of embolism  detected on this transthoracic study. A transesophageal echocardiogram is  recommended to exclude cardiac source of embolism if clinically indicated.  Laboratory Data:  High Sensitivity Troponin:  No results for input(s): TROPONINIHS in the last 720 hours.   Chemistry Recent Labs  Lab 03/05/20 1209 03/06/20 0158  NA 139 139  K 4.3 4.0  CL 103 104  CO2 27 28  GLUCOSE 108* 76  BUN 20 18  CREATININE 1.01 0.86  CALCIUM 8.7* 8.7*  GFRNONAA >60 >60  GFRAA >60 >60  ANIONGAP 9 7    Recent Labs  Lab 03/05/20 1209  PROT 6.5  ALBUMIN 3.7  AST 22  ALT 21  ALKPHOS 45  BILITOT 0.6   Hematology Recent Labs  Lab 03/05/20 1209 03/06/20 0158  WBC 6.6 6.7  RBC 4.28 4.44  HGB 14.0 14.5  HCT 41.9 42.9  MCV 97.9 96.6  MCH 32.7 32.7  MCHC 33.4 33.8  RDW 11.9 11.8  PLT 158 162   BNPNo results for input(s): BNP, PROBNP in the last 168 hours.  DDimer No results for input(s): DDIMER in the last 168 hours.   Radiology/Studies:  CT HEAD WO CONTRAST  Result Date: 03/05/2020 CLINICAL DATA:  Acute neuro deficit. Facial droop today. Dizziness. EXAM: CT HEAD WITHOUT CONTRAST TECHNIQUE: Contiguous axial images were obtained from the base of the skull through the vertex without intravenous contrast. COMPARISON:  CT head 11/18/2019 FINDINGS: Brain:  Ventricle size and cerebral volume normal for age. Negative for acute infarct, hemorrhage, mass Vascular: Negative for hyperdense vessel Skull: Negative Sinuses/Orbits: Paranasal sinuses clear.  No orbital lesion. Other: None IMPRESSION: No acute intracranial abnormality.  Negative for age. Electronically Signed   By: Franchot Gallo M.D.   On: 03/05/2020 12:53   MR BRAIN WO CONTRAST  Result Date: 03/06/2020 CLINICAL DATA:  Initial evaluation for neuro deficit, stroke suspected. EXAM: MRI HEAD WITHOUT CONTRAST TECHNIQUE: Multiplanar, multiecho pulse sequences of the brain and surrounding structures were obtained without intravenous contrast. COMPARISON:  Prior head CT from earlier the same day. FINDINGS: Brain: Generalized age-related cerebral atrophy. Scattered patchy T2/FLAIR hyperintensity within the periventricular and deep white matter both cerebral hemispheres most consistent with chronic small vessel ischemic disease, mild for age. No definite abnormal foci of restricted diffusion to suggest acute or subacute ischemia. Please note evaluation is somewhat limited due to motion artifact and poor resolution on axial DWI sequence. Gray-white matter differentiation maintained. No encephalomalacia to suggest chronic cortical infarction. No acute intracranial hemorrhage. Single punctate chronic microhemorrhage noted within the left frontal lobe, of doubtful significance in isolation. No mass lesion, midline shift or mass effect. No hydrocephalus or extra-axial  fluid collection. Pituitary gland suprasellar region normal. Midline structures intact. Vascular: Major intracranial vascular flow voids are maintained. Skull and upper cervical spine: Craniocervical junction normal. Bone marrow signal intensity within normal limits. No scalp soft tissue abnormality. Sinuses/Orbits: Globes and orbital soft tissues within normal limits. Paranasal sinuses are largely clear. No mastoid effusion. Inner ear structures grossly  normal. Other: None. IMPRESSION: 1. No acute intracranial abnormality. 2. Generalized age-related cerebral atrophy with mild chronic small vessel ischemic disease. Electronically Signed   By: Jeannine Boga M.D.   On: 03/06/2020 00:21   ECHOCARDIOGRAM COMPLETE  Result Date: 03/06/2020    ECHOCARDIOGRAM REPORT   Patient Name:   Joshua Hess Date of Exam: 03/06/2020 Medical Rec #:  [id number]     Height:       69.0 in Accession #:    [id number]    Weight:       171.1 lb Date of Birth:  1940/08/12      BSA:          1.933 m Patient Age:    55 years      BP:           141/72 mmHg Patient Gender: M             HR:           56 bpm. Exam Location:  Inpatient Procedure: 2D Echo Indications:    Stroke I163.9  History:        Patient has prior history of Echocardiogram examinations, most                 recent 01/26/2019. CAD.                 Aortic Valve: 23 mm bioprosthetic valve is present in the aortic                 position. Procedure Date: 11/10/2018.  Sonographer:    Mikki Santee RDCS (AE) Referring Phys: [id number] [place]  1. Left ventricular ejection fraction, by estimation, is 50 to 55%. The left ventricle has low normal function. The left ventricle has no regional wall motion abnormalities. There is mild concentric left ventricular hypertrophy. Left ventricular diastolic parameters are consistent with Grade I diastolic dysfunction (impaired relaxation).  2. Right ventricular systolic function is low normal. The right ventricular size is mildly enlarged. There is normal pulmonary artery systolic pressure. The estimated right ventricular systolic pressure is 41.3 mmHg.  3. Left atrial size was mild to moderately dilated.  4. Right atrial size was mildly dilated.  5. The mitral valve is grossly normal. Mild mitral valve regurgitation. No evidence of mitral stenosis.  6. 23 mm surgical prosthetic valve present with V max 2.8 m/s, MG 17 mmHG, EOA 1.40 cm2. The aortic valve has been  repaired/replaced. Aortic valve regurgitation is not visualized. No aortic stenosis is present. There is a 23 mm bioprosthetic valve present in the aortic position. Procedure Date: 11/10/2018. Echo findings are consistent with normal structure and function of the aortic valve prosthesis.  7. The inferior vena cava is normal in size with greater than 50% respiratory variability, suggesting right atrial pressure of 3 mmHg. Comparison(s): No significant change from prior study. Conclusion(s)/Recommendation(s): No intracardiac source of embolism detected on this transthoracic study. A transesophageal echocardiogram is recommended to exclude cardiac source of embolism if clinically indicated. FINDINGS  Left Ventricle: Left ventricular ejection fraction, by estimation, is 50 to 55%. The left ventricle has low normal  function. The left ventricle has no regional wall motion abnormalities. The left ventricular internal cavity size was normal in size. There is mild concentric left ventricular hypertrophy. Abnormal (paradoxical) septal motion consistent with post-operative status. Left ventricular diastolic parameters are consistent with Grade I diastolic dysfunction (impaired relaxation). Right Ventricle: The right ventricular size is mildly enlarged. No increase in right ventricular wall thickness. Right ventricular systolic function is low normal. There is normal pulmonary artery systolic pressure. The tricuspid regurgitant velocity is 2.14 m/s, and with an assumed right atrial pressure of 3 mmHg, the estimated right ventricular systolic pressure is 68.1 mmHg. Left Atrium: Left atrial size was mild to moderately dilated. Right Atrium: Right atrial size was mildly dilated. Pericardium: There is no evidence of pericardial effusion. Mitral Valve: The mitral valve is grossly normal. Mild mitral valve regurgitation. No evidence of mitral valve stenosis. Tricuspid Valve: The tricuspid valve is grossly normal. Tricuspid valve  regurgitation is mild . No evidence of tricuspid stenosis. Aortic Valve: 23 mm surgical prosthetic valve present with V max 2.8 m/s, MG 17 mmHG, EOA 1.40 cm2. The aortic valve has been repaired/replaced. Aortic valve regurgitation is not visualized. No aortic stenosis is present. Aortic valve mean gradient measures 17.0 mmHg. Aortic valve peak gradient measures 32.7 mmHg. Aortic valve area, by VTI measures 1.40 cm. There is a 23 mm bioprosthetic valve present in the aortic position. Procedure Date: 11/10/2018. Echo findings are consistent with normal structure and function of the aortic valve prosthesis. Pulmonic Valve: The pulmonic valve was grossly normal. Pulmonic valve regurgitation is not visualized. No evidence of pulmonic stenosis. Aorta: The aortic root is normal in size and structure. Venous: The inferior vena cava is normal in size with greater than 50% respiratory variability, suggesting right atrial pressure of 3 mmHg. IAS/Shunts: The atrial septum is grossly normal. EKG: Rhythm strip during this exam demostrated sinus bradycardia and premature ventricular contractions.  LEFT VENTRICLE PLAX 2D LVIDd:         5.04 cm  Diastology LVIDs:         3.65 cm  LV e' lateral:   6.20 cm/s LV PW:         1.18 cm  LV E/e' lateral: 10.0 LV IVS:        1.21 cm  LV e' medial:    4.24 cm/s LVOT diam:     2.20 cm  LV E/e' medial:  14.6 LV SV:         89 LV SV Index:   46 LVOT Area:     3.80 cm  RIGHT VENTRICLE RV S prime:     11.50 cm/s TAPSE (M-mode): 1.3 cm LEFT ATRIUM              Index       RIGHT ATRIUM           Index LA diam:        4.60 cm  2.38 cm/m  RA Area:     27.80 cm LA Vol (A2C):   128.0 ml 66.21 ml/m RA Volume:   93.60 ml  48.41 ml/m LA Vol (A4C):   68.6 ml  35.48 ml/m LA Biplane Vol: 97.5 ml  50.43 ml/m  AORTIC VALVE AV Area (Vmax):    1.27 cm AV Area (Vmean):   1.40 cm AV Area (VTI):     1.40 cm AV Vmax:           286.00 cm/s AV Vmean:  194.000 cm/s AV VTI:            0.634 m AV Peak  Grad:      32.7 mmHg AV Mean Grad:      17.0 mmHg LVOT Vmax:         95.80 cm/s LVOT Vmean:        71.500 cm/s LVOT VTI:          0.233 m LVOT/AV VTI ratio: 0.37  AORTA Ao Root diam: 3.50 cm MITRAL VALVE               TRICUSPID VALVE MV Area (PHT): 1.48 cm    TR Peak grad:   18.3 mmHg MV Decel Time: 511 msec    TR Vmax:        214.00 cm/s MV E velocity: 61.70 cm/s MV A velocity: 57.00 cm/s  SHUNTS MV E/A ratio:  1.08        Systemic VTI:  0.23 m                            Systemic Diam: 2.20 cm Eleonore Chiquito MD Electronically signed by Eleonore Chiquito MD Signature Date/Time: 03/06/2020/12:23:13 PM    Final    CT ANGIO HEAD CODE STROKE  Result Date: 03/06/2020 CLINICAL DATA:  Initial evaluation for neuro deficit, stroke suspected. EXAM: CT ANGIOGRAPHY HEAD AND NECK TECHNIQUE: Multidetector CT imaging of the head and neck was performed using the standard protocol during bolus administration of intravenous contrast. Multiplanar CT image reconstructions and MIPs were obtained to evaluate the vascular anatomy. Carotid stenosis measurements (when applicable) are obtained utilizing NASCET criteria, using the distal internal carotid diameter as the denominator. CONTRAST:  66m OMNIPAQUE IOHEXOL 350 MG/ML SOLN COMPARISON:  Prior head CT from earlier the same day. FINDINGS: CTA NECK FINDINGS Aortic arch: Visualized aortic arch normal in caliber with normal branch pattern. Mild atheromatous change about the arch itself. No visible hemodynamically significant stenosis about the origin of the great vessels. Right carotid system: Scattered eccentric calcified plaque within the right CCA without flow-limiting stenosis. Additional eccentric calcified plaque at the origin of the right ICA without significant stenosis. Right ICA patent distally to the skull base without stenosis, dissection or occlusion. Left carotid system: Left CCA widely patent proximally. Concentric mixed plaque about the left carotid bifurcation with  associated stenosis of up to 75% by NASCET criteria (series 8, image 233). Left ICA patent distally to the skull base without stenosis, dissection or occlusion. Vertebral arteries: Both vertebral arteries arise from the subclavian arteries. Eccentric calcified plaque at the origin of the right subclavian artery with approximate 50% stenosis (series 8, image 78). Moderate approximate 50% stenosis at the origin of the left vertebral artery (series 8, image 114). Vertebral arteries otherwise widely patent within the neck without stenosis, dissection or occlusion. Skeleton: No acute osseous abnormality. Sclerotic lesion within the C3 spinous process most consistent with a small benign bone island. No other discrete or worrisome osseous lesions. Mild multilevel cervical spondylosis without high-grade stenosis. Other neck: No other acute soft tissue abnormality within the neck. No mass lesion or adenopathy. Upper chest: Visualized upper chest demonstrates no acute finding. Review of the MIP images confirms the above findings CTA HEAD FINDINGS Anterior circulation: Petrous segments widely patent bilaterally. Scattered atheromatous plaque throughout the carotid siphons with associated mild multifocal narrowing. ICA termini well perfused. A1 segments widely patent. Normal anterior communicating artery complex. Anterior cerebral arteries widely patent to  their distal aspects without stenosis. No M1 stenosis or occlusion. Normal MCA bifurcations. Distal MCA branches well perfused and symmetric. Posterior circulation: Vertebral arteries patent to the vertebrobasilar junction without stenosis. Mild non stenotic plaque noted within the proximal left V4 segment. Left PICA patent. Right PICA not seen. Basilar patent to its distal aspect without stenosis. Superior cerebral arteries patent bilaterally. Both PCAs primarily supplied via the basilar perfused to their distal aspects without stenosis. Venous sinuses: Patent allowing for  timing the contrast bolus. Anatomic variants: None significant.  No intracranial aneurysm. Review of the MIP images confirms the above findings IMPRESSION: 1. Negative CTA for emergent large vessel occlusion. 2. 75% atheromatous stenosis about the left carotid bifurcation. 3. 50% stenosis at the origin of the right subclavian artery. 4. Moderate approximate 50% stenosis at the origin of the left vertebral artery. 5. Additional mild scattered atherosclerotic change elsewhere about the major arterial vasculature of the head and neck as above. No other hemodynamically significant or correctable stenosis. Electronically Signed   By: Jeannine Boga M.D.   On: 03/06/2020 00:46   CT ANGIO NECK CODE STROKE  Result Date: 03/06/2020 CLINICAL DATA:  Initial evaluation for neuro deficit, stroke suspected. EXAM: CT ANGIOGRAPHY HEAD AND NECK TECHNIQUE: Multidetector CT imaging of the head and neck was performed using the standard protocol during bolus administration of intravenous contrast. Multiplanar CT image reconstructions and MIPs were obtained to evaluate the vascular anatomy. Carotid stenosis measurements (when applicable) are obtained utilizing NASCET criteria, using the distal internal carotid diameter as the denominator. CONTRAST:  73m OMNIPAQUE IOHEXOL 350 MG/ML SOLN COMPARISON:  Prior head CT from earlier the same day. FINDINGS: CTA NECK FINDINGS Aortic arch: Visualized aortic arch normal in caliber with normal branch pattern. Mild atheromatous change about the arch itself. No visible hemodynamically significant stenosis about the origin of the great vessels. Right carotid system: Scattered eccentric calcified plaque within the right CCA without flow-limiting stenosis. Additional eccentric calcified plaque at the origin of the right ICA without significant stenosis. Right ICA patent distally to the skull base without stenosis, dissection or occlusion. Left carotid system: Left CCA widely patent proximally.  Concentric mixed plaque about the left carotid bifurcation with associated stenosis of up to 75% by NASCET criteria (series 8, image 233). Left ICA patent distally to the skull base without stenosis, dissection or occlusion. Vertebral arteries: Both vertebral arteries arise from the subclavian arteries. Eccentric calcified plaque at the origin of the right subclavian artery with approximate 50% stenosis (series 8, image 78). Moderate approximate 50% stenosis at the origin of the left vertebral artery (series 8, image 114). Vertebral arteries otherwise widely patent within the neck without stenosis, dissection or occlusion. Skeleton: No acute osseous abnormality. Sclerotic lesion within the C3 spinous process most consistent with a small benign bone island. No other discrete or worrisome osseous lesions. Mild multilevel cervical spondylosis without high-grade stenosis. Other neck: No other acute soft tissue abnormality within the neck. No mass lesion or adenopathy. Upper chest: Visualized upper chest demonstrates no acute finding. Review of the MIP images confirms the above findings CTA HEAD FINDINGS Anterior circulation: Petrous segments widely patent bilaterally. Scattered atheromatous plaque throughout the carotid siphons with associated mild multifocal narrowing. ICA termini well perfused. A1 segments widely patent. Normal anterior communicating artery complex. Anterior cerebral arteries widely patent to their distal aspects without stenosis. No M1 stenosis or occlusion. Normal MCA bifurcations. Distal MCA branches well perfused and symmetric. Posterior circulation: Vertebral arteries patent to the vertebrobasilar junction without  stenosis. Mild non stenotic plaque noted within the proximal left V4 segment. Left PICA patent. Right PICA not seen. Basilar patent to its distal aspect without stenosis. Superior cerebral arteries patent bilaterally. Both PCAs primarily supplied via the basilar perfused to their distal  aspects without stenosis. Venous sinuses: Patent allowing for timing the contrast bolus. Anatomic variants: None significant.  No intracranial aneurysm. Review of the MIP images confirms the above findings IMPRESSION: 1. Negative CTA for emergent large vessel occlusion. 2. 75% atheromatous stenosis about the left carotid bifurcation. 3. 50% stenosis at the origin of the right subclavian artery. 4. Moderate approximate 50% stenosis at the origin of the left vertebral artery. 5. Additional mild scattered atherosclerotic change elsewhere about the major arterial vasculature of the head and neck as above. No other hemodynamically significant or correctable stenosis. Electronically Signed   By: Jeannine Boga M.D.   On: 03/06/2020 00:46    Assessment and Plan:   Pre-Op Evaluation - Patient presented with right sided facial droop. Found to have 75% atheromatous stenosis at left carotid bifurcation that vascular surgery wants to intervene on Friday. Cardiology consulted for pre-op evaluation. - Patient doing well from a cardiac standpoint. No angina. No CHF symptoms.  - Echo this admission shows normal LVEF.  - Per Revised Cardiac Risk Index, considered moderate risk with 6.6% risk of adverse cardiac events (given high risk surgery and CVA). - Patient has good functional status and able to complete >4.0 METS. Walk/runs 3-4 miles everyday. Therefore, patient at acceptable risk to proceed with procedure without any additional cardiac evaluation. MD to follow with any additional recommendations.  Acute CVA - Patient admitted with acute right facial droop.  - Head CT and Brain MRI showed no acute findings. - Head/neck CTA showed CTA negative for emergent large vessel occlusion but did show 75% atheromatous stenosis about the left carotid bifurcation, 50% stenosis at the origin of the right subclavian artery, and approximately 50% stenosis at the origin of the left vertebral artery. Vascular surgery  consulted. - Started on DAPT with Aspirin and Plavix. Crestor increased to 1m daily. - Management per Neurology.  Carotid Artery Disease - Head/neck CTA as above. - Vascular surgery consulted and plan is for   Mild Non-Obstructive CAD - Mild non-obstructive CAD noted on cath in 09/2018. - EKG showed no acute ischemic changes.  - Started on DAPT given CVA. Continue statin.  Severe Aortic Stenosis s/p AVR in 10/2018 - Echo this admission showed normal structure and function of valve.  Post-Op Atrial Fibrillation Frequent PVCs/PACs - History of brief post-op atrial fibrillation. Not on anticoagulation. - Patient having frequent PVC/PACs on telemetry, sometimes in bigeminy pattern. Underlying sinus rhythm.  - Potassium 4.0.  - TSH normal.  - Will check Magnesium. - Patient unable to tolerate Metoprolol in the past due to fatigue and nausea. Given baseline low rates, will continue to monitor at this time.  For questions or updates, please contact C[place] TownPlease consult [email or web address] for contact info under    Signed, CDarreld Mclean PA-C  03/06/2020 2:51 PM

## 2020-03-06 NOTE — Evaluation (Signed)
Speech Language Pathology Evaluation Patient Details Name: Joshua Hess MRN: [id number] DOB: 1941-07-08 Today's Date: 03/06/2020 Time: 6720-9470 SLP Time Calculation (min) (ACUTE ONLY): 32 min  Problem List:  Patient Active Problem List   Diagnosis Date Noted  . Facial droop 03/05/2020  . Mild CAD   . Acute encephalopathy 11/18/2019  . Acute urinary retention 11/18/2019  . Post-operative state 11/18/2019  . Aphasia 08/07/2019  . S/P AVR (aortic valve replacement)   . Carotid artery stenosis   . Chronotropic incompetence   . Severe aortic stenosis   . Sinus bradycardia 08/12/2018  . Benign prostatic hyperplasia with nocturia 11/01/2012   Past Medical History:  Past Medical History:  Diagnosis Date  . Aphasia   . BPH (benign prostatic hyperplasia)   . Cancer ([place])    behind ear- [place]  . Carotid artery stenosis   . Chronotropic incompetence   . ED (erectile dysfunction)   . Elevated PSA   . HOH (hard of hearing)   . Mild CAD   . Plantar fasciitis   . Postoperative atrial fibrillation ([place])   . Severe aortic stenosis    a. s/p tissue AVR 10/2018.  Marland Kitchen Sinus bradycardia    baseline  . Wears glasses    Past Surgical History:  Past Surgical History:  Procedure Laterality Date  . AORTIC VALVE REPLACEMENT N/A 11/10/2018   Procedure: AORTIC VALVE REPLACEMENT (AVR), USING 23MM INSPIRIS;  Surgeon: Gaye Pollack, MD;  Location: [facility];  Service: Open Heart Surgery;  Laterality: N/A;  . EXTERNAL EAR SURGERY     melanoma removed  . HERNIA REPAIR Right    inguinal  . INGUINAL HERNIA REPAIR Right 11/15/2019   Procedure: LAPAROSCOPIC RIGHT INGUINAL HERNIA REPAIR WITH MESH;  Surgeon: Coralie Keens, MD;  Location: [facility];  Service: General;  Laterality: Right;  . RIGHT/LEFT HEART CATH AND CORONARY ANGIOGRAPHY N/A 09/29/2018   Procedure: RIGHT/LEFT HEART CATH AND CORONARY ANGIOGRAPHY;  Surgeon: Burnell Blanks, MD;  Location: [facility];  Service: Cardiovascular;   Laterality: N/A;  . TEE WITHOUT CARDIOVERSION N/A 11/10/2018   Procedure: TRANSESOPHAGEAL ECHOCARDIOGRAM (TEE);  Surgeon: Gaye Pollack, MD;  Location: [facility];  Service: Open Heart Surgery;  Laterality: N/A;  . TRANSCATHETER AORTIC VALVE REPLACEMENT, TRANSFEMORAL     PROCEDURE ATTEMPTED & ABORTED   . UMBILICAL HERNIA REPAIR N/A 11/15/2019   Procedure: UMBILICAL HERNIA REPAIR;  Surgeon: Coralie Keens, MD;  Location: [facility];  Service: General;  Laterality: N/A;   HPI:  Joshua Hess is a 79 y.o. male with medical history significant for severe AS s/p tissue AVR April 2020, mild nonobstructive CAD, sinus bradycardia, carotid artery stenosis (8/12 carotid Dopplers show left ICA 40-59%, right ICA 1-39%), BPH, and hard of hearing who presented to [facility] ED for evaluation of new onset right facial droop.   Assessment / Plan / Recommendation Clinical Impression  Mr. Pullman demonstrates a moderate cognitive impairment characterized by decreased attention, short term memory, and complex problem solving. He has noted R sided facial droop, which is not impacting speech intelligibility in conversation to unfamiliar listener. Pt with impaired clock drawing, not appearing related to visual deficits, but more related to thought organization. Pt easily frustrated with these cognitive tasks. His husband was present to aid as historian and he reports an ongoing cognitive decline over the past year, which they attribute to some carotid stenosis. He previously received outpatient speech therapy for aphasia, but did not target memory per husband  report. He is recommended for further outpatient speech therapy to target memory/compensation. Pt did not appear interested in this, stating, "he (therapist) told me I didn't need it last time." Pt was educated that therapy was previously for anomia/aphasia, not memory, so goals would be different. Encouraged further ST. No acute needs.  [facility]  Mental Status Examination Orientation: 3/3 Coding: 4 trial Calculations: 3/3 (severely prolonged processing speeds, initially wrong, but self corrected) Naming: 2/3 Recall: 2/5 Attention: 1/2 Visual Processing: 4/6 Paragraph Memory: 4/8  Total: 19/30; moderate cognitive impairment with 27+ being WNL     SLP Assessment  SLP Recommendation/Assessment: All further Speech Language Pathology  needs can be addressed in the next venue of care SLP Visit Diagnosis: Cognitive communication deficit (R41.841)    Follow Up Recommendations  Outpatient SLP    Frequency and Duration   none on acute level        SLP Evaluation Cognition  Overall Cognitive Status: History of cognitive impairments - at baseline Arousal/Alertness: Awake/alert Orientation Level: Oriented X4 Attention: Focused Focused Attention: Impaired Focused Attention Impairment: Verbal basic;Functional basic Memory: Impaired Memory Impairment: Storage deficit;Retrieval deficit;Decreased short term memory Decreased Short Term Memory: Verbal basic;Functional basic Awareness: Impaired Problem Solving: Impaired Executive Function: Self Correcting;Decision Dispensing optician: Impaired Decision Making: Impaired Self Correcting: Impaired Safety/Judgment: Appears intact       Comprehension  Auditory Comprehension Overall Auditory Comprehension: Appears within functional limits for tasks assessed    Expression Expression Primary Mode of Expression: Verbal Written Expression Dominant Hand: Right Written Expression: Within Functional Limits   Oral / Motor  Oral Motor/Sensory Function Overall Oral Motor/Sensory Function: Mild impairment Facial ROM: Reduced right Facial Symmetry: Abnormal symmetry right Facial Strength: Reduced right Lingual ROM: Within Functional Limits Lingual Symmetry: Within Functional Limits Lingual Strength: Within Functional Limits Motor Speech Overall Motor Speech: Appears within  functional limits for tasks assessed Articulation: Within functional limitis Intelligibility: Intelligible Motor Speech Errors: Not applicable                    Shakeila Pfarr P. Vinisha Faxon, M.S., CCC-SLP Speech-Language Pathologist Acute Rehabilitation Services Pager: [place] 03/06/2020, 12:28 PM

## 2020-03-06 NOTE — Evaluation (Addendum)
Occupational Therapy Evaluation Patient Details Name: Joshua Hess MRN: [id number] DOB: 20-Jan-1941 Today's Date: 03/06/2020    History of Present Illness Patient is a 79 y/o male with PMH of severe AS s/p tissue AVR April 2020, mild nonobstructive CAD, sinus bradycardia, CAD, HOH presenting to [facility] for evaluation of new onset R facial droop. CT negative for acute intracranial abnormality.    Clinical Impression   PTA patient independent with ADLs, mobility (very active). Admitted for above and limited by problem list below, including impaired balance and decreased cognition.  Patient currently requires min guard to supervision for in room mobility and transfers, basic ADLs. Noted 1 LOB with quick movement/turning into restroom requiring min guard to correct; partner reports recent lightheadedness with positional changes. No deficits noted in vision, strength, sensation.  Scored 12/28 on short blessed test, with deficits with STM and sequencing; partner confirms STM deficits recently, but appears have increased difficulty with problem solving and awareness. Believe patient will benefit from further OT services while admitted to optimize independence and safety, and recommend continued outpatient OT at discharge at this time.     Follow Up Recommendations  Supervision/Assistance - 24 hour;Outpatient OT    Equipment Recommendations  None recommended by OT    Recommendations for Other Services       Precautions / Restrictions Precautions Precautions: Fall Restrictions Weight Bearing Restrictions: No      Mobility Bed Mobility Overal bed mobility: Independent                Transfers Overall transfer level: Needs assistance   Transfers: Sit to/from Stand Sit to Stand: Min guard         General transfer comment: min guard fading to supervision, mild unsteadiness     Balance Overall balance assessment: Needs assistance Sitting-balance support: No upper  extremity supported;Feet supported Sitting balance-Leahy Scale: Good     Standing balance support: No upper extremity supported;During functional activity Standing balance-Leahy Scale: Fair Standing balance comment: patient with 1 LOB standing when trying to turn towards bathroom, min guard to correct                            ADL either performed or assessed with clinical judgement   ADL Overall ADL's : Needs assistance/impaired     Grooming: Min guard;Standing   Upper Body Bathing: Set up;Sitting   Lower Body Bathing: Sit to/from stand;Min guard   Upper Body Dressing : Set up;Sitting   Lower Body Dressing: Min guard;Sit to/from stand   Toilet Transfer: Min guard;Ambulation   Toileting- Clothing Manipulation and Hygiene: Min guard;Sit to/from stand       Functional mobility during ADLs: Min guard;Cueing for safety General ADL Comments: patient limited by impaired balance and decreased cognition      Vision Baseline Vision/History: Wears glasses Wears Glasses: At all times Patient Visual Report: No change from baseline Vision Assessment?: No apparent visual deficits     Perception     Praxis      Pertinent Vitals/Pain Pain Assessment: No/denies pain     Hand Dominance Right   Extremity/Trunk Assessment Upper Extremity Assessment Upper Extremity Assessment: Overall WFL for tasks assessed   Lower Extremity Assessment Lower Extremity Assessment: Defer to PT evaluation       Communication Communication Communication: No difficulties   Cognition Arousal/Alertness: Awake/alert Behavior During Therapy: Anxious (mildly anxious, eager to get out of the hospital ) Overall Cognitive Status:  History of cognitive impairments - at baseline Area of Impairment: Memory;Following commands;Problem solving;Awareness;Safety/judgement                     Memory: Decreased short-term memory Following Commands: Follows multi-step commands with  increased time Safety/Judgement: Decreased awareness of deficits Awareness: Emergent Problem Solving: Slow processing;Difficulty sequencing;Requires verbal cues General Comments: Short blessed test: 12/28 impairment consistent with dementia with deficits specifically seen in sequencing and STM.  Patient presenting with difficulty describing cognitive deficits, but partner provides hx of encephalopathy recently with difficulty problem solving and recall.  Will continue to assess.    General Comments  VSS HR 60-70 throughout session; partner present and supportive throughout session     Exercises     Shoulder [place] expects to be discharged to:: Private residence Living Arrangements: Spouse/significant other (partner ) Available Help at Discharge: Family;Available 24 hours/day Type of Home: House Home Access: Stairs to enter [employer] of Steps: 2-3 Entrance Stairs-Rails: Can reach both Home Layout: Two level;Able to live on main level with bedroom/bathroom     Bathroom Shower/Tub: Walk-in shower;Tub/shower unit   Bathroom Toilet: Standard     Home Equipment: Environmental consultant - 2 wheels;Cane - single point;Shower seat          Prior Functioning/Environment Level of Independence: Independent        Comments: walks 3-5 miles daily; hx of running over 200 marathons; reports recent hx of decreased STM, partner assists with med mgmt         OT Problem List: Impaired balance (sitting and/or standing);Decreased safety awareness;Decreased cognition      OT Treatment/Interventions: Self-care/ADL training;Balance training;Patient/family education;Cognitive remediation/compensation;Therapeutic activities    OT Goals(Current goals can be found in the care plan section) Acute Rehab OT Goals Patient Stated Goal: to get home  OT Goal Formulation: With patient Time For Goal Achievement: 03/20/20 Potential to Achieve Goals: Good  OT  Frequency: Min 2X/week   Barriers to D/C:            Co-evaluation              AM-PAC OT "6 Clicks" Daily Activity     Outcome Measure Help from another person eating meals?: None Help from another person taking care of personal grooming?: A Little Help from another person toileting, which includes using toliet, bedpan, or urinal?: A Little Help from another person bathing (including washing, rinsing, drying)?: A Little Help from another person to put on and taking off regular upper body clothing?: A Little Help from another person to put on and taking off regular lower body clothing?: A Little 6 Click Score: 19   End of Session Nurse Communication: Mobility status  Activity Tolerance: Patient tolerated treatment well Patient left: in bed;with call bell/phone within reach;with bed alarm set;with family/visitor present  OT Visit Diagnosis: Other abnormalities of gait and mobility (R26.89);Muscle weakness (generalized) (M62.81);Other symptoms and signs involving cognitive function                Time: 1610-9604 OT Time Calculation (min): 32 min Charges:  OT General Charges $OT Visit: 1 Visit OT Evaluation $OT Eval Low Complexity: 1 Low OT Treatments $Self Care/Home Management : 8-22 mins  Joshua Hess, OT Acute Rehabilitation Services Pager [phone] Office [phone]   Joshua Hess 03/06/2020, 10:01 AM

## 2020-03-06 NOTE — Progress Notes (Signed)
  Echocardiogram 2D Echocardiogram has been performed.  [name] 03/06/2020, 10:50 AM

## 2020-03-06 NOTE — Consult Note (Addendum)
Hospital Consult    Reason for Consult:  Requesting Physician: Zada Finders MD MRN #:  [id number]  History of Present Illness: This is a 79 y.o. male who is known to [facility]. He has pertinent medical history of severe Aortic stenosis s/p Aortic Valve Replacement in April of 2020 by Dr. Cyndia Bent, CAD, sinus bradycardia, and carotid artery disease who presented to [place] 8/24 with new onset of right facial drooping. Most of history provided by patients partner [name]. The patient had apparently had just gotten back from a 3 mile run per his usual daily activity and his partner noticed upon his return that his right side of his face was drooping. He is unsure how long it had been like this. He also had some lightheadedness but otherwise denied any visual changes, slurred speech, weakness of his upper or lower extremities, dizziness, headache, confusion, chest pain, palpitations, shortness of breath. He otherwise feels that he is at his baseline but still does have some right facial drooping. Per patients partner patient is very active, has run multiple marathons and is almost entirely vegetarian. He has a very remote history of smoking in 1970's. He does have some significant family history of CAD  Patient was initially seen by Dr. Donnetta Hutching in October of 2019 for evaluation of left ICA stenosis. At the time was found to have 50-70% left ICA stenosis on duplex, no significant right ICA stenosis. He was asymptomatic at the time and had follow up scheduled for 1 year. He was subsequently seen in February of 2021. At the time he did have episode of transient aphasia. This was following his AVR for aortic stenosis. He had no other symptoms. He was continued on Aspirin and statin and scheduled for follow up in 6 months. In the interim he has been evaluated by Neurology and has had several MRIs which have been negative for any acute findings. He most recently was seen in follow up on 02/22/20. He did not  report any symptoms at this time. He had carotid duplex which showed stable Left ICA stenosis of 40-59% and right ICA stenosis of 1-39%  On admission patient had CT showing no acute intracranial abnormality, MRI brain showing no acute findings with some generalized age related cerebral atrophy with small vessel ischemic changes. He then later yesterday had CTA head and neck which showed no large vessel occlusion but 75% Atheromatous stenosis by NASCET criteria of the left carotid bifurcation. 50% stenosis identified in the right subclavian and left vertebral arteries as well. Patient has been started on plavix and his aspirin and statin have been continued. Echo has been ordered and results are pending   Past Medical History:  Diagnosis Date  . Aphasia   . BPH (benign prostatic hyperplasia)   . Cancer ([place])    behind ear- [place]  . Carotid artery stenosis   . Chronotropic incompetence   . ED (erectile dysfunction)   . Elevated PSA   . HOH (hard of hearing)   . Mild CAD   . Plantar fasciitis   . Postoperative atrial fibrillation ([place])   . Severe aortic stenosis    a. s/p tissue AVR 10/2018.  Marland Kitchen Sinus bradycardia    baseline  . Wears glasses     Past Surgical History:  Procedure Laterality Date  . AORTIC VALVE REPLACEMENT N/A 11/10/2018   Procedure: AORTIC VALVE REPLACEMENT (AVR), USING 23MM INSPIRIS;  Surgeon: Gaye Pollack, MD;  Location: [facility];  Service: Open Heart Surgery;  Laterality: N/A;  . EXTERNAL EAR SURGERY     melanoma removed  . HERNIA REPAIR Right    inguinal  . INGUINAL HERNIA REPAIR Right 11/15/2019   Procedure: LAPAROSCOPIC RIGHT INGUINAL HERNIA REPAIR WITH MESH;  Surgeon: Coralie Keens, MD;  Location: [facility];  Service: General;  Laterality: Right;  . RIGHT/LEFT HEART CATH AND CORONARY ANGIOGRAPHY N/A 09/29/2018   Procedure: RIGHT/LEFT HEART CATH AND CORONARY ANGIOGRAPHY;  Surgeon: Burnell Blanks, MD;  Location: [facility];  Service:  Cardiovascular;  Laterality: N/A;  . TEE WITHOUT CARDIOVERSION N/A 11/10/2018   Procedure: TRANSESOPHAGEAL ECHOCARDIOGRAM (TEE);  Surgeon: Gaye Pollack, MD;  Location: [facility];  Service: Open Heart Surgery;  Laterality: N/A;  . TRANSCATHETER AORTIC VALVE REPLACEMENT, TRANSFEMORAL     PROCEDURE ATTEMPTED & ABORTED   . UMBILICAL HERNIA REPAIR N/A 11/15/2019   Procedure: UMBILICAL HERNIA REPAIR;  Surgeon: Coralie Keens, MD;  Location: [facility];  Service: General;  Laterality: N/A;    No Known Allergies  Prior to Admission medications   Medication Sig Start Date End Date Taking? Authorizing Provider  Apoaequorin (PREVAGEN PO) Take 1 tablet by mouth daily.   Yes [provider]  aspirin EC 81 MG EC tablet Take 1 tablet (81 mg total) by mouth daily. 11/15/18  Yes Gold, Wayne E, PA-C  finasteride (PROSCAR) 5 MG tablet Take 1 tablet (5 mg total) by mouth daily. 11/22/19  Yes Irene Pap N, DO  sertraline (ZOLOFT) 100 MG tablet Take 100 mg by mouth daily.   Yes [provider]  sildenafil (REVATIO) 20 MG tablet Take 20 mg by mouth daily as needed (ED).   Yes [provider]  ALPRAZolam Duanne Moron) 0.5 MG tablet Take 0.5 mg by mouth daily as needed for anxiety.  Patient not taking: Reported on 02/22/2020    [provider]  amoxicillin (AMOXIL) 500 MG capsule TAKE 4 CAPSULES BY MOUTH ONE TO TWO HOURS PRIOR TO DENTAL APPOINTMENT 03/01/20   Burnell Blanks, MD  Multiple Vitamin (MULTIVITAMIN WITH MINERALS) TABS tablet Take 1 tablet by mouth daily. Patient not taking: Reported on 02/22/2020 11/22/19   Kayleen Memos, DO  polyethylene glycol (MIRALAX / GLYCOLAX) 17 g packet Take 17 g by mouth daily. Patient not taking: Reported on 02/22/2020 11/22/19   Kayleen Memos, DO  rosuvastatin (CRESTOR) 10 MG tablet Take 10 mg by mouth daily. 01/31/19   [provider]  traZODone (DESYREL) 50 MG tablet Take 100 mg by mouth at bedtime.     [provider]     Social History   Socioeconomic History  . Marital status: Married    Spouse name: Not on file  . Number of children: 0  . Years of education: college  . Highest education level: Bachelor's degree (e.g., BA, AB, BS)  Occupational History  . Occupation: Retired-Human Resources  Tobacco Use  . Smoking status: Former Smoker    Years: 1.00    Quit date: 1970    Years since quitting: 51.6  . Smokeless tobacco: Never Used  Vaping Use  . Vaping Use: Never used  Substance and Sexual Activity  . Alcohol use: No  . Drug use: No  . Sexual activity: Not on file  Other Topics Concern  . Not on file  Social History Narrative   Caffeine: 2 cups daily   Right-handed.   Lives with spouse.   Social Determinants of Health   Financial Resource Strain:   . Difficulty of Paying Living  Expenses: Not on file  Food Insecurity:   . Worried About Charity fundraiser in the Last Year: Not on file  . Ran Out of Food in the Last Year: Not on file  Transportation Needs:   . Lack of Transportation (Medical): Not on file  . Lack of Transportation (Non-Medical): Not on file  Physical Activity:   . Days of Exercise per Week: Not on file  . Minutes of Exercise per Session: Not on file  Stress:   . Feeling of Stress : Not on file  Social Connections:   . Frequency of Communication with Friends and Family: Not on file  . Frequency of Social Gatherings with Friends and Family: Not on file  . Attends Religious Services: Not on file  . Active Member of Clubs or Organizations: Not on file  . Attends Archivist Meetings: Not on file  . Marital Status: Not on file  Intimate Partner Violence:   . Fear of Current or Ex-Partner: Not on file  . Emotionally Abused: Not on file  . Physically Abused: Not on file  . Sexually Abused: Not on file     Family History  Problem Relation Age of Onset  . Heart attack Mother   . Other Father        unsure of history  . Heart disease Brother   .  Dementia Sister     ROS: Otherwise negative unless mentioned in HPI  Physical Examination  Vitals:   03/06/20 0316 03/06/20 0753  BP: 140/73 (!) 141/72  Pulse: (!) 50 (!) 48  Resp: 16 18  Temp: 98 F (36.7 C) (!) 97.5 F (36.4 C)  SpO2: 97% 100%   Body mass index is 25.26 kg/m.  General: very pleasant, well developed well nourished male Gait: Normal HENT: WNL, normocephalic Pulmonary: normal non-labored breathing, without Rales, rhonchi,  wheezing Cardiac: regular, without  Murmurs, rubs or gallops; without carotid bruits Abdomen:  soft, NT/ND, no masses Vascular Exam/Pulses: 2+ radial pulses, bilateral lower extremities well perfused and warm. 2+ DP Extremities: without ischemic changes, without Gangrene , without cellulitis; without open wounds; Moving all extremities without deficits Musculoskeletal: no muscle wasting or atrophy  Neurologic: A&O X 3;  No focal weakness or paresthesias are detected; seems somewhat confused but he is also hard of hearing, speech is fluent/normal. Drooping of right lower lip, tongue deviates to right. CN otherwise intact Psychiatric:  The pt has Normal affect.   CBC    Component Value Date/Time   WBC 6.7 03/06/2020 0158   RBC 4.44 03/06/2020 0158   HGB 14.5 03/06/2020 0158   HGB 14.2 09/26/2018 1142   HCT 42.9 03/06/2020 0158   HCT 40.4 09/26/2018 1142   PLT 162 03/06/2020 0158   PLT 179 09/26/2018 1142   MCV 96.6 03/06/2020 0158   MCV 96 09/26/2018 1142   MCH 32.7 03/06/2020 0158   MCHC 33.8 03/06/2020 0158   RDW 11.8 03/06/2020 0158   RDW 11.8 09/26/2018 1142   LYMPHSABS 2.1 03/05/2020 1209   MONOABS 0.6 03/05/2020 1209   EOSABS 0.1 03/05/2020 1209   BASOSABS 0.0 03/05/2020 1209    BMET    Component Value Date/Time   NA 139 03/06/2020 0158   NA 142 09/26/2018 1142   K 4.0 03/06/2020 0158   CL 104 03/06/2020 0158   CO2 28 03/06/2020 0158   GLUCOSE 76 03/06/2020 0158   BUN 18 03/06/2020 0158   BUN 25 09/26/2018 1142     CREATININE  0.86 03/06/2020 0158   CREATININE 0.91 11/01/2012 1209   CALCIUM 8.7 (L) 03/06/2020 0158   GFRNONAA >60 03/06/2020 0158   GFRAA >60 03/06/2020 0158    COAGS: Lab Results  Component Value Date   INR 1.1 03/05/2020   INR 1.0 07/31/2019   INR 1.5 (H) 11/10/2018     Non-Invasive Vascular Imaging:   02/22/20 VAS Carotid duplex Right Carotid: Velocities in the right ICA are consistent with a 1-39% stenosis.  Left Carotid: Velocities in the left ICA are consistent with a 40-59% stenosis.  Vertebrals: Bilateral vertebral arteries demonstrate antegrade flow.  Subclavians: Normal flow hemodynamics were seen in bilateral subclavian arteries.   MR Brain 03/05/20: IMPRESSION: 1. No acute intracranial abnormality. 2. Generalized age-related cerebral atrophy with mild chronic small vessel ischemic disease.  CT Angio Head and Neck 03/05/20: IMPRESSION: 1. Negative CTA for emergent large vessel occlusion. 2. 75% atheromatous stenosis about the left carotid bifurcation. 3. 50% stenosis at the origin of the right subclavian artery. 4. Moderate approximate 50% stenosis at the origin of the left vertebral artery. 5. Additional mild scattered atherosclerotic change elsewhere about the major arterial vasculature of the head and neck as above. No other hemodynamically significant or correctable stenosis.  Echocardiogram pending  Statin:  Yes.   Beta Blocker:  No. Aspirin:  Yes.   ACEI:  No. ARB:  No. CCB use:  No Other antiplatelets/anticoagulants:  Yes.   Lovenox   ASSESSMENT/PLAN: This is a 79 y.o. male who presented with acute onset of right facial drooping. MRI Brain and CT negative for acute abnormalities. CTA head and neck showing 75% left ICA bifurcation calcific stenosis. Recent carotid duplex showed 40-59% left ICA stenosis.  He is currently on statin and Aspirin. Plavix has been started by Neurology. Recommend he continue DAPT and statin. This event is likely related to  his left ICA stenosis. Echo pending results. Patient will need CEA vs TCAR over the next couple weeks. On call vascular surgeon Dr. Donnetta Hutching will see patient later today evaluate and to discuss further management plans   Karoline Caldwell PA-C [facility] [phone] 03/06/2020  10:28 AM   I have examined the patient, reviewed and agree with above.  Patient admitted with stroke resulting in right facial droop.  Neurology evaluation noted felt to have a left brain event.  MRI shows no evidence of acute stroke.  CT scan shows no bleed.  He is known to our service with prior evaluation of asymptomatic moderate stenosis.  He did undergo CT angiogram which I have reviewed and also reviewed the images of the scan with the patient and his partner.  He has a 75% left internal carotid artery stenosis with very irregular mixed plaque.  He has extreme calcification on the outer portion and this has a very soft plaque on the midportion.  Echocardiogram shows no source for embolus.  I explained there is no way to prove the source of his stroke.  His left internal carotid artery stenosis and irregular plaque of the most likely etiology.  I have recommended left carotid endarterectomy for reduction of stroke risk.  I did discuss the option of TCAR I would recommend endarterectomy with the irregular plaque.  He does not have a high bifurcation and no anatomic increased risk for endarterectomy.  He wishes to discuss this further prior to making a decision.  I explained that would be available for surgery as an inpatient or could be discharged with a brief interval and readmitted  for endarterectomy.  I discussed the potential 1 to 2% risk for stroke with surgery and a very slight risk of cranial nerve injury.  Curt Jews, MD 03/06/2020 6:06 PM

## 2020-03-06 NOTE — Consult Note (Addendum)
Stroke Neurology Consultation Note  Consult Requested by: [facility], teleneuro consult 03/05/2020 at 1319 at [facility]    Reason for Consult: R facial droop follow up  Consult Date:  03/06/20  History of Present Illness:  Joshua Hess is an 79 y.o. Caucasian male with PMH of carotid artery stenosis (left ICA 40-59% via u/s), bradycardia, s/p AVR 10/2018, hard of hearing who got up yesterday morning and went for a walk. When he returned, partner noticed R facial droop. He was LKW the night prior. Pt did not feel right facial droop but denies any other neuro deficit. He was taken to [place] ED and teleneurology evaluated pt and recommend stroke work up in [facility]. CT negative for acute change, but CTA head and neck reported left ICA 75% stenosis with mixed soft and calcified plaques.  Date last known well: Date: 03/04/2020 Time last known well: bedtime tPA Given: No: out of the window MRS:  0 NIHSS:  1  Past Medical History:  Diagnosis Date  . Aphasia   . BPH (benign prostatic hyperplasia)   . Cancer ([place])    behind ear- [place]  . Carotid artery stenosis   . Chronotropic incompetence   . ED (erectile dysfunction)   . Elevated PSA   . HOH (hard of hearing)   . Mild CAD   . Plantar fasciitis   . Postoperative atrial fibrillation ([place])   . Severe aortic stenosis    a. s/p tissue AVR 10/2018.  Marland Kitchen Sinus bradycardia    baseline  . Wears glasses      Past Surgical History:  Procedure Laterality Date  . AORTIC VALVE REPLACEMENT N/A 11/10/2018   Procedure: AORTIC VALVE REPLACEMENT (AVR), USING 23MM INSPIRIS;  Surgeon: Gaye Pollack, MD;  Location: [place];  Service: Open Heart Surgery;  Laterality: N/A;  . EXTERNAL EAR SURGERY     melanoma removed  . HERNIA REPAIR Right    inguinal  . INGUINAL HERNIA REPAIR Right 11/15/2019   Procedure: LAPAROSCOPIC RIGHT INGUINAL HERNIA REPAIR WITH MESH;  Surgeon: Coralie Keens, MD;  Location: [place];  Service:  General;  Laterality: Right;  . RIGHT/LEFT HEART CATH AND CORONARY ANGIOGRAPHY N/A 09/29/2018   Procedure: RIGHT/LEFT HEART CATH AND CORONARY ANGIOGRAPHY;  Surgeon: Burnell Blanks, MD;  Location: [place] CV LAB;  Service: Cardiovascular;  Laterality: N/A;  . TEE WITHOUT CARDIOVERSION N/A 11/10/2018   Procedure: TRANSESOPHAGEAL ECHOCARDIOGRAM (TEE);  Surgeon: Gaye Pollack, MD;  Location: [place];  Service: Open Heart Surgery;  Laterality: N/A;  . TRANSCATHETER AORTIC VALVE REPLACEMENT, TRANSFEMORAL     PROCEDURE ATTEMPTED & ABORTED   . UMBILICAL HERNIA REPAIR N/A 11/15/2019   Procedure: UMBILICAL HERNIA REPAIR;  Surgeon: Coralie Keens, MD;  Location: [place];  Service: General;  Laterality: N/A;    Family History  Problem Relation Age of Onset  . Heart attack Mother   . Other Father        unsure of history  . Heart disease Brother   . Dementia Sister      Social History:  reports that he quit smoking about 51 years ago. He quit after 1.00 year of use. He has never used smokeless tobacco. He reports that he does not drink alcohol and does not use drugs.  Review of Systems: A full ROS was attempted today and was able to be performed.  Systems assessed include - Constitutional, Eyes, HENT, Respiratory, Cardiovascular, Gastrointestinal, Genitourinary, Integument/breast, Hematologic/lymphatic, Musculoskeletal, Neurological, Behavioral/Psych,  Endocrine, Allergic/Immunologic - with pertinent responses as per HPI.  Allergies:  Allergies  Allergen Reactions  . Flomax [Tamsulosin] Other (See Comments)    Made feel foggy headed     Medications: I have reviewed the patient's current medications.  Test Results: CBC:  Recent Labs  Lab 03/05/20 1209 03/06/20 0158  WBC 6.6 6.7  NEUTROABS 3.8  --   HGB 14.0 14.5  HCT 41.9 42.9  MCV 97.9 96.6  PLT 158 948   Basic Metabolic Panel:  Recent Labs  Lab 03/05/20 1209 03/06/20 0158  NA 139 139  K 4.3 4.0  CL 103 104  CO2 27  28  GLUCOSE 108* 76  BUN 20 18  CREATININE 1.01 0.86  CALCIUM 8.7* 8.7*   Liver Function Tests: Recent Labs  Lab 03/05/20 1209  AST 22  ALT 21  ALKPHOS 45  BILITOT 0.6  PROT 6.5  ALBUMIN 3.7   No results for input(s): LIPASE, AMYLASE in the last 168 hours. No results for input(s): AMMONIA in the last 168 hours. Coagulation Studies:  Recent Labs    03/05/20 1209  LABPROT 13.3  INR 1.1   Cardiac Enzymes: No results for input(s): CKTOTAL, CKMB, CKMBINDEX, TROPONINI in the last 168 hours. BNP: Invalid input(s): POCBNP CBG: No results for input(s): GLUCAP in the last 168 hours. Urinalysis:  Recent Labs  Lab 03/05/20 [place]  LABSPEC 1.025  PHURINE 5.0  GLUCOSEU NEGATIVE  HGBUR TRACE*  BILIRUBINUR NEGATIVE  KETONESUR NEGATIVE  PROTEINUR NEGATIVE  NITRITE NEGATIVE  LEUKOCYTESUR NEGATIVE   Microbiology:  Results for orders placed or performed during the hospital encounter of 03/05/20  SARS Coronavirus 2 by RT PCR (hospital order, performed in [facility] hospital lab) Nasopharyngeal Nasopharyngeal Swab     Status: None   Collection Time: 03/05/20  2:50 PM   Specimen: Nasopharyngeal Swab  Result Value Ref Range Status   SARS Coronavirus 2 NEGATIVE NEGATIVE Final    Comment: (NOTE) SARS-CoV-2 target nucleic acids are NOT DETECTED.  The SARS-CoV-2 RNA is generally detectable in upper and lower respiratory specimens during the acute phase of infection. The lowest concentration of SARS-CoV-2 viral copies this assay can detect is 250 copies / mL. A negative result does not preclude SARS-CoV-2 infection and should not be used as the sole basis for treatment or other patient management decisions.  A negative result may occur with improper specimen collection / handling, submission of specimen other than nasopharyngeal swab, presence of viral mutation(s) within the areas targeted by this assay, and inadequate number of viral copies (<250 copies / mL). A  negative result must be combined with clinical observations, patient history, and epidemiological information.  Fact Sheet for Patients:   StrictlyIdeas.no  Fact Sheet for Healthcare Providers: BankingDealers.co.za  This test is not yet approved or  cleared by the [place] FDA and has been authorized for detection and/or diagnosis of SARS-CoV-2 by FDA under an Emergency Use Authorization (EUA).  This EUA will remain in effect (meaning this test can be used) for the duration of the COVID-19 declaration under Section 564(b)(1) of the Act, 21 U.S.C. section 360bbb-3(b)(1), unless the authorization is terminated or revoked sooner.  Performed at [facility], [address] [place], [place] [address]    Lipid Panel:     Component Value Date/Time   CHOL 109 03/06/2020 0158   TRIG 49 03/06/2020 0158   HDL 47 03/06/2020 0158   CHOLHDL 2.3 03/06/2020 0158   VLDL 10  03/06/2020 0158   LDLCALC 52 03/06/2020 0158   HgbA1c:  Lab Results  Component Value Date   HGBA1C 5.2 03/06/2020   Urine Drug Screen:     Component Value Date/Time   LABOPIA NONE DETECTED 03/05/2020 1350   COCAINSCRNUR NONE DETECTED 03/05/2020 1350   LABBENZ POSITIVE (A) 03/05/2020 1350   AMPHETMU NONE DETECTED 03/05/2020 1350   THCU NONE DETECTED 03/05/2020 1350   LABBARB NONE DETECTED 03/05/2020 1350    Alcohol Level:  Recent Labs  Lab 03/05/20 1209  ETH <10    CT HEAD WO CONTRAST  Result Date: 03/05/2020 CLINICAL DATA:  Acute neuro deficit. Facial droop today. Dizziness. EXAM: CT HEAD WITHOUT CONTRAST TECHNIQUE: Contiguous axial images were obtained from the base of the skull through the vertex without intravenous contrast. COMPARISON:  CT head 11/18/2019 FINDINGS: Brain: Ventricle size and cerebral volume normal for age. Negative for acute infarct, hemorrhage, mass Vascular: Negative for hyperdense vessel Skull: Negative Sinuses/Orbits:  Paranasal sinuses clear.  No orbital lesion. Other: None IMPRESSION: No acute intracranial abnormality.  Negative for age. Electronically Signed   By: Franchot Gallo M.D.   On: 03/05/2020 12:53   MR BRAIN WO CONTRAST  Result Date: 03/06/2020 CLINICAL DATA:  Initial evaluation for neuro deficit, stroke suspected. EXAM: MRI HEAD WITHOUT CONTRAST TECHNIQUE: Multiplanar, multiecho pulse sequences of the brain and surrounding structures were obtained without intravenous contrast. COMPARISON:  Prior head CT from earlier the same day. FINDINGS: Brain: Generalized age-related cerebral atrophy. Scattered patchy T2/FLAIR hyperintensity within the periventricular and deep white matter both cerebral hemispheres most consistent with chronic small vessel ischemic disease, mild for age. No definite abnormal foci of restricted diffusion to suggest acute or subacute ischemia. Please note evaluation is somewhat limited due to motion artifact and poor resolution on axial DWI sequence. Gray-white matter differentiation maintained. No encephalomalacia to suggest chronic cortical infarction. No acute intracranial hemorrhage. Single punctate chronic microhemorrhage noted within the left frontal lobe, of doubtful significance in isolation. No mass lesion, midline shift or mass effect. No hydrocephalus or extra-axial fluid collection. Pituitary gland suprasellar region normal. Midline structures intact. Vascular: Major intracranial vascular flow voids are maintained. Skull and upper cervical spine: Craniocervical junction normal. Bone marrow signal intensity within normal limits. No scalp soft tissue abnormality. Sinuses/Orbits: Globes and orbital soft tissues within normal limits. Paranasal sinuses are largely clear. No mastoid effusion. Inner ear structures grossly normal. Other: None. IMPRESSION: 1. No acute intracranial abnormality. 2. Generalized age-related cerebral atrophy with mild chronic small vessel ischemic disease.  Electronically Signed   By: Jeannine Boga M.D.   On: 03/06/2020 00:21   ECHOCARDIOGRAM COMPLETE  Result Date: 03/06/2020    ECHOCARDIOGRAM REPORT   Patient Name:   Joshua Hess Date of Exam: 03/06/2020 Medical Rec #:  [id number]     Height:       69.0 in Accession #:    [id number]    Weight:       171.1 lb Date of Birth:  1940/11/29      BSA:          1.933 m Patient Age:    57 years      BP:           141/72 mmHg Patient Gender: M             HR:           56 bpm. Exam Location:  Inpatient Procedure: 2D Echo Indications:    Stroke I163.9  History:  Patient has prior history of Echocardiogram examinations, most                 recent 01/26/2019. CAD.                 Aortic Valve: 23 mm bioprosthetic valve is present in the aortic                 position. Procedure Date: 11/10/2018.  Sonographer:    Mikki Santee RDCS (AE) Referring Phys: [id number] [place]  1. Left ventricular ejection fraction, by estimation, is 50 to 55%. The left ventricle has low normal function. The left ventricle has no regional wall motion abnormalities. There is mild concentric left ventricular hypertrophy. Left ventricular diastolic parameters are consistent with Grade I diastolic dysfunction (impaired relaxation).  2. Right ventricular systolic function is low normal. The right ventricular size is mildly enlarged. There is normal pulmonary artery systolic pressure. The estimated right ventricular systolic pressure is 29.5 mmHg.  3. Left atrial size was mild to moderately dilated.  4. Right atrial size was mildly dilated.  5. The mitral valve is grossly normal. Mild mitral valve regurgitation. No evidence of mitral stenosis.  6. 23 mm surgical prosthetic valve present with V max 2.8 m/s, MG 17 mmHG, EOA 1.40 cm2. The aortic valve has been repaired/replaced. Aortic valve regurgitation is not visualized. No aortic stenosis is present. There is a 23 mm bioprosthetic valve present in the aortic position.  Procedure Date: 11/10/2018. Echo findings are consistent with normal structure and function of the aortic valve prosthesis.  7. The inferior vena cava is normal in size with greater than 50% respiratory variability, suggesting right atrial pressure of 3 mmHg. Comparison(s): No significant change from prior study. Conclusion(s)/Recommendation(s): No intracardiac source of embolism detected on this transthoracic study. A transesophageal echocardiogram is recommended to exclude cardiac source of embolism if clinically indicated. FINDINGS  Left Ventricle: Left ventricular ejection fraction, by estimation, is 50 to 55%. The left ventricle has low normal function. The left ventricle has no regional wall motion abnormalities. The left ventricular internal cavity size was normal in size. There is mild concentric left ventricular hypertrophy. Abnormal (paradoxical) septal motion consistent with post-operative status. Left ventricular diastolic parameters are consistent with Grade I diastolic dysfunction (impaired relaxation). Right Ventricle: The right ventricular size is mildly enlarged. No increase in right ventricular wall thickness. Right ventricular systolic function is low normal. There is normal pulmonary artery systolic pressure. The tricuspid regurgitant velocity is 2.14 m/s, and with an assumed right atrial pressure of 3 mmHg, the estimated right ventricular systolic pressure is 18.8 mmHg. Left Atrium: Left atrial size was mild to moderately dilated. Right Atrium: Right atrial size was mildly dilated. Pericardium: There is no evidence of pericardial effusion. Mitral Valve: The mitral valve is grossly normal. Mild mitral valve regurgitation. No evidence of mitral valve stenosis. Tricuspid Valve: The tricuspid valve is grossly normal. Tricuspid valve regurgitation is mild . No evidence of tricuspid stenosis. Aortic Valve: 23 mm surgical prosthetic valve present with V max 2.8 m/s, MG 17 mmHG, EOA 1.40 cm2. The aortic  valve has been repaired/replaced. Aortic valve regurgitation is not visualized. No aortic stenosis is present. Aortic valve mean gradient measures 17.0 mmHg. Aortic valve peak gradient measures 32.7 mmHg. Aortic valve area, by VTI measures 1.40 cm. There is a 23 mm bioprosthetic valve present in the aortic position. Procedure Date: 11/10/2018. Echo findings are consistent with normal structure and function of the aortic valve  prosthesis. Pulmonic Valve: The pulmonic valve was grossly normal. Pulmonic valve regurgitation is not visualized. No evidence of pulmonic stenosis. Aorta: The aortic root is normal in size and structure. Venous: The inferior vena cava is normal in size with greater than 50% respiratory variability, suggesting right atrial pressure of 3 mmHg. IAS/Shunts: The atrial septum is grossly normal. EKG: Rhythm strip during this exam demostrated sinus bradycardia and premature ventricular contractions.  LEFT VENTRICLE PLAX 2D LVIDd:         5.04 cm  Diastology LVIDs:         3.65 cm  LV e' lateral:   6.20 cm/s LV PW:         1.18 cm  LV E/e' lateral: 10.0 LV IVS:        1.21 cm  LV e' medial:    4.24 cm/s LVOT diam:     2.20 cm  LV E/e' medial:  14.6 LV SV:         89 LV SV Index:   46 LVOT Area:     3.80 cm  RIGHT VENTRICLE RV S prime:     11.50 cm/s TAPSE (M-mode): 1.3 cm LEFT ATRIUM              Index       RIGHT ATRIUM           Index LA diam:        4.60 cm  2.38 cm/m  RA Area:     27.80 cm LA Vol (A2C):   128.0 ml 66.21 ml/m RA Volume:   93.60 ml  48.41 ml/m LA Vol (A4C):   68.6 ml  35.48 ml/m LA Biplane Vol: 97.5 ml  50.43 ml/m  AORTIC VALVE AV Area (Vmax):    1.27 cm AV Area (Vmean):   1.40 cm AV Area (VTI):     1.40 cm AV Vmax:           286.00 cm/s AV Vmean:          194.000 cm/s AV VTI:            0.634 m AV Peak Grad:      32.7 mmHg AV Mean Grad:      17.0 mmHg LVOT Vmax:         95.80 cm/s LVOT Vmean:        71.500 cm/s LVOT VTI:          0.233 m LVOT/AV VTI ratio: 0.37  AORTA Ao  Root diam: 3.50 cm MITRAL VALVE               TRICUSPID VALVE MV Area (PHT): 1.48 cm    TR Peak grad:   18.3 mmHg MV Decel Time: 511 msec    TR Vmax:        214.00 cm/s MV E velocity: 61.70 cm/s MV A velocity: 57.00 cm/s  SHUNTS MV E/A ratio:  1.08        Systemic VTI:  0.23 m                            Systemic Diam: 2.20 cm Eleonore Chiquito MD Electronically signed by Eleonore Chiquito MD Signature Date/Time: 03/06/2020/12:23:13 PM    Final    VAS US CAROTID  Result Date: 02/22/2020 Carotid Arterial Duplex Study Indications: Carotid artery disease. Performing Technologist: Alvia Grove RVT  Examination Guidelines: A complete evaluation includes B-mode imaging, spectral Doppler, color Doppler, and power Doppler  as needed of all accessible portions of each vessel. Bilateral testing is considered an integral part of a complete examination. Limited examinations for reoccurring indications may be performed as noted.  Right Carotid Findings: +----------+--------+--------+--------+-------------------------+--------+           PSV cm/sEDV cm/sStenosisPlaque Description       Comments +----------+--------+--------+--------+-------------------------+--------+ CCA Prox  119     18              heterogenous                      +----------+--------+--------+--------+-------------------------+--------+ CCA Mid   114     19              heterogenous and calcific         +----------+--------+--------+--------+-------------------------+--------+ CCA Distal103     22                                                +----------+--------+--------+--------+-------------------------+--------+ ICA Prox  74      9               heterogenous                      +----------+--------+--------+--------+-------------------------+--------+ ICA Mid   64      22                                                +----------+--------+--------+--------+-------------------------+--------+ ICA Distal69      18                                                 +----------+--------+--------+--------+-------------------------+--------+ ECA       101     12                                                +----------+--------+--------+--------+-------------------------+--------+ +----------+--------+-------+----------------+-------------------+           PSV cm/sEDV cmsDescribe        Arm Pressure (mmHG) +----------+--------+-------+----------------+-------------------+ Subclavian101     3      Multiphasic, WNL                    +----------+--------+-------+----------------+-------------------+ +---------+--------+--+--------+-+---------+ VertebralPSV cm/s45EDV cm/s9Antegrade +---------+--------+--+--------+-+---------+  Left Carotid Findings: +----------+--------+--------+--------+------------------+--------+           PSV cm/sEDV cm/sStenosisPlaque DescriptionComments +----------+--------+--------+--------+------------------+--------+ CCA Prox  110     14              homogeneous                +----------+--------+--------+--------+------------------+--------+ CCA Mid   96      20              heterogenous               +----------+--------+--------+--------+------------------+--------+ CCA Distal97      21              heterogenous               +----------+--------+--------+--------+------------------+--------+  ICA Prox  262     58      40-59%  calcific                   +----------+--------+--------+--------+------------------+--------+ ICA Mid   86      18                                         +----------+--------+--------+--------+------------------+--------+ ICA Distal58      18                                         +----------+--------+--------+--------+------------------+--------+ ECA       265     45      >50%    heterogenous               +----------+--------+--------+--------+------------------+--------+  +----------+--------+--------+----------------+-------------------+           PSV cm/sEDV cm/sDescribe        Arm Pressure (mmHG) +----------+--------+--------+----------------+-------------------+ Subclavian122     3       Multiphasic, WNL                    +----------+--------+--------+----------------+-------------------+ +---------+--------+--+--------+--+---------+ VertebralPSV cm/s56EDV cm/s11Antegrade +---------+--------+--+--------+--+---------+   Summary: Right Carotid: Velocities in the right ICA are consistent with a 1-39% stenosis. Left Carotid: Velocities in the left ICA are consistent with a 40-59% stenosis. Vertebrals:  Bilateral vertebral arteries demonstrate antegrade flow. Subclavians: Normal flow hemodynamics were seen in bilateral subclavian              arteries. *See table(s) above for measurements and observations.  Electronically signed by Ruta Hinds MD on 02/22/2020 at 11:25:42 AM.    Final    CT ANGIO HEAD CODE STROKE  Result Date: 03/06/2020 CLINICAL DATA:  Initial evaluation for neuro deficit, stroke suspected. EXAM: CT ANGIOGRAPHY HEAD AND NECK TECHNIQUE: Multidetector CT imaging of the head and neck was performed using the standard protocol during bolus administration of intravenous contrast. Multiplanar CT image reconstructions and MIPs were obtained to evaluate the vascular anatomy. Carotid stenosis measurements (when applicable) are obtained utilizing NASCET criteria, using the distal internal carotid diameter as the denominator. CONTRAST:  49mL OMNIPAQUE IOHEXOL 350 MG/ML SOLN COMPARISON:  Prior head CT from earlier the same day. FINDINGS: CTA NECK FINDINGS Aortic arch: Visualized aortic arch normal in caliber with normal branch pattern. Mild atheromatous change about the arch itself. No visible hemodynamically significant stenosis about the origin of the great vessels. Right carotid system: Scattered eccentric calcified plaque within the right CCA without  flow-limiting stenosis. Additional eccentric calcified plaque at the origin of the right ICA without significant stenosis. Right ICA patent distally to the skull base without stenosis, dissection or occlusion. Left carotid system: Left CCA widely patent proximally. Concentric mixed plaque about the left carotid bifurcation with associated stenosis of up to 75% by NASCET criteria (series 8, image 233). Left ICA patent distally to the skull base without stenosis, dissection or occlusion. Vertebral arteries: Both vertebral arteries arise from the subclavian arteries. Eccentric calcified plaque at the origin of the right subclavian artery with approximate 50% stenosis (series 8, image 78). Moderate approximate 50% stenosis at the origin of the left vertebral artery (series 8, image 114). Vertebral arteries otherwise widely patent within the neck without stenosis, dissection or occlusion.  Skeleton: No acute osseous abnormality. Sclerotic lesion within the C3 spinous process most consistent with a small benign bone island. No other discrete or worrisome osseous lesions. Mild multilevel cervical spondylosis without high-grade stenosis. Other neck: No other acute soft tissue abnormality within the neck. No mass lesion or adenopathy. Upper chest: Visualized upper chest demonstrates no acute finding. Review of the MIP images confirms the above findings CTA HEAD FINDINGS Anterior circulation: Petrous segments widely patent bilaterally. Scattered atheromatous plaque throughout the carotid siphons with associated mild multifocal narrowing. ICA termini well perfused. A1 segments widely patent. Normal anterior communicating artery complex. Anterior cerebral arteries widely patent to their distal aspects without stenosis. No M1 stenosis or occlusion. Normal MCA bifurcations. Distal MCA branches well perfused and symmetric. Posterior circulation: Vertebral arteries patent to the vertebrobasilar junction without stenosis. Mild non  stenotic plaque noted within the proximal left V4 segment. Left PICA patent. Right PICA not seen. Basilar patent to its distal aspect without stenosis. Superior cerebral arteries patent bilaterally. Both PCAs primarily supplied via the basilar perfused to their distal aspects without stenosis. Venous sinuses: Patent allowing for timing the contrast bolus. Anatomic variants: None significant.  No intracranial aneurysm. Review of the MIP images confirms the above findings IMPRESSION: 1. Negative CTA for emergent large vessel occlusion. 2. 75% atheromatous stenosis about the left carotid bifurcation. 3. 50% stenosis at the origin of the right subclavian artery. 4. Moderate approximate 50% stenosis at the origin of the left vertebral artery. 5. Additional mild scattered atherosclerotic change elsewhere about the major arterial vasculature of the head and neck as above. No other hemodynamically significant or correctable stenosis. Electronically Signed   By: Jeannine Boga M.D.   On: 03/06/2020 00:46   CT ANGIO NECK CODE STROKE  Result Date: 03/06/2020 CLINICAL DATA:  Initial evaluation for neuro deficit, stroke suspected. EXAM: CT ANGIOGRAPHY HEAD AND NECK TECHNIQUE: Multidetector CT imaging of the head and neck was performed using the standard protocol during bolus administration of intravenous contrast. Multiplanar CT image reconstructions and MIPs were obtained to evaluate the vascular anatomy. Carotid stenosis measurements (when applicable) are obtained utilizing NASCET criteria, using the distal internal carotid diameter as the denominator. CONTRAST:  38mL OMNIPAQUE IOHEXOL 350 MG/ML SOLN COMPARISON:  Prior head CT from earlier the same day. FINDINGS: CTA NECK FINDINGS Aortic arch: Visualized aortic arch normal in caliber with normal branch pattern. Mild atheromatous change about the arch itself. No visible hemodynamically significant stenosis about the origin of the great vessels. Right carotid system:  Scattered eccentric calcified plaque within the right CCA without flow-limiting stenosis. Additional eccentric calcified plaque at the origin of the right ICA without significant stenosis. Right ICA patent distally to the skull base without stenosis, dissection or occlusion. Left carotid system: Left CCA widely patent proximally. Concentric mixed plaque about the left carotid bifurcation with associated stenosis of up to 75% by NASCET criteria (series 8, image 233). Left ICA patent distally to the skull base without stenosis, dissection or occlusion. Vertebral arteries: Both vertebral arteries arise from the subclavian arteries. Eccentric calcified plaque at the origin of the right subclavian artery with approximate 50% stenosis (series 8, image 78). Moderate approximate 50% stenosis at the origin of the left vertebral artery (series 8, image 114). Vertebral arteries otherwise widely patent within the neck without stenosis, dissection or occlusion. Skeleton: No acute osseous abnormality. Sclerotic lesion within the C3 spinous process most consistent with a small benign bone island. No other discrete or worrisome osseous lesions. Mild multilevel cervical  spondylosis without high-grade stenosis. Other neck: No other acute soft tissue abnormality within the neck. No mass lesion or adenopathy. Upper chest: Visualized upper chest demonstrates no acute finding. Review of the MIP images confirms the above findings CTA HEAD FINDINGS Anterior circulation: Petrous segments widely patent bilaterally. Scattered atheromatous plaque throughout the carotid siphons with associated mild multifocal narrowing. ICA termini well perfused. A1 segments widely patent. Normal anterior communicating artery complex. Anterior cerebral arteries widely patent to their distal aspects without stenosis. No M1 stenosis or occlusion. Normal MCA bifurcations. Distal MCA branches well perfused and symmetric. Posterior circulation: Vertebral arteries  patent to the vertebrobasilar junction without stenosis. Mild non stenotic plaque noted within the proximal left V4 segment. Left PICA patent. Right PICA not seen. Basilar patent to its distal aspect without stenosis. Superior cerebral arteries patent bilaterally. Both PCAs primarily supplied via the basilar perfused to their distal aspects without stenosis. Venous sinuses: Patent allowing for timing the contrast bolus. Anatomic variants: None significant.  No intracranial aneurysm. Review of the MIP images confirms the above findings IMPRESSION: 1. Negative CTA for emergent large vessel occlusion. 2. 75% atheromatous stenosis about the left carotid bifurcation. 3. 50% stenosis at the origin of the right subclavian artery. 4. Moderate approximate 50% stenosis at the origin of the left vertebral artery. 5. Additional mild scattered atherosclerotic change elsewhere about the major arterial vasculature of the head and neck as above. No other hemodynamically significant or correctable stenosis. Electronically Signed   By: Jeannine Boga M.D.   On: 03/06/2020 00:46     EKG: normal sinus rhythm, nonspecific T wave changes, PVC's noted.   Physical Examination: Temp:  [97.5 F (36.4 C)-98.2 F (36.8 C)] 98.1 F (36.7 C) (08/25 1119) Pulse Rate:  [48-59] 49 (08/25 1119) Resp:  [8-18] 16 (08/25 1119) BP: (123-167)/(72-98) 125/82 (08/25 1119) SpO2:  [96 %-100 %] 97 % (08/25 1119)  General - Well nourished, well developed, in no apparent distress.  Ophthalmologic - fundi not visualized due to noncooperation.  Cardiovascular - Regular rate and rhythm.  Mental Status -  Level of arousal and orientation to time, place, and person were intact. Language including expression, naming, repetition, comprehension was assessed and found intact. Fund of Knowledge was assessed and was intact.  Cranial Nerves II - XII - II - Visual field intact OU. III, IV, VI - Extraocular movements intact. V - Facial  sensation intact bilaterally. VII - right nasolabial fold flattening. VIII - Hearing & vestibular intact bilaterally. X - Palate elevates symmetrically. XI - Chin turning & shoulder shrug intact bilaterally. XII - Tongue protrusion intact.  Motor Strength - The patient's strength was normal in all extremities and pronator drift was absent.  Bulk was normal and fasciculations were absent.   Motor Tone - Muscle tone was assessed at the neck and appendages and was normal.  Reflexes - The patient's reflexes were symmetrical in all extremities and he had no pathological reflexes.  Sensory - Light touch, temperature/pinprick were assessed and were symmetrical.    Coordination - The patient had normal movements in the hands and feet with no ataxia or dysmetria.  Tremor was absent.  Gait and Station - deferred.    Assessment:  Mr. Joshua Hess is a 79 y.o. male with history of carotid artery stenosis (per son 50-70% via u/s) bradycardia, s/p AVR 10/2018, hard of hearing presenting to [facility] with R facial droop. He did not receive IV t-PA due to presenting outside the window.  L brain TIA vs. Small stroke no seen on MRI  Still has right facial droop with improvement per partner  CT head No acute stroke.   MRI  No acute stroke. Small vessel disease. Atrophy.   CTA head & neck no LVO. L ICA bifurcation 75% stenosis. R subclavian artery 50% stenosis. L VA origin 50% stenosis. Mild scattered atherosclerosis   Carotid Doppler 02/22/2020 L 40-59% stenosis  2D Echo EF 50-55%. No source of embolus. LA mildly dilated.  Prosthetic AV  LDL 52  HgbA1c 5.2   Lovenox 40 mg sq daily for VTE prophylaxis  aspirin 81 mg daily prior to admission, now on aspirin 81 mg daily and clopidogrel 75 mg daily.   Therapy recommendations:  No PT, OP SLP (language)  Disposition:  Return home  Carotid Stenosis  CTA neck 75% L ICA bifurcation stenosis   Carotid doppler 02/22/2020 40-59%  stenosis  VVS consulted   Plan CEA vs TCAR - Pt yet to decide   Continue DAPT and statin   Hx of TIA   07/2019, acute word finding difficulty, had ER visit, CT negative.  Follow-up as outpatient MRI 08/2019 - for acute infarct.  11/2019 had a ER visit for encephalopathy, MRI and CT negative.  Follows with Dr. Krista Blue at [facility]  Hypertension  Stable . Long-term BP goal normotensive  Hyperlipidemia  Home meds:  crestor 20, resumed in hospital  LDL 52, goal < 70  Continue statin at discharge  Other Stroke Risk Factors  Advanced age  Former Cigarette smoker, quit 57 yrs ago  Mild Coronary artery disease  Severe AS s/p tissue AVR  Other Active Problems  Chronic bradycardia, stable (avid marathoner) - cardiology on board, no need to treat  BPH on finasteride   Depression/anxiety on sertraline  [facility] day # 0   Thank you for this consultation and allowing [place] to participate in the care of this patient. Will follow  Rosalin Hawking, MD PhD Stroke Neurology 03/06/2020 8:22 PM  To contact Stroke Continuity provider, please refer to [email or web address] After hours, contact General Neurology

## 2020-03-06 NOTE — Progress Notes (Signed)
PROGRESS NOTE    [facility]  DXA:[id number] DOB: [birth year] DOA: 03/05/2020 PCP: Joshua Pretty, MD   Brief Narrative:  HPI per Dr. Zada [facility] is a 79 y.o. male with medical history significant for severe AS s/p tissue AVR April 2020, mild nonobstructive CAD, sinus bradycardia, carotid artery stenosis (8/12 carotid Dopplers show left ICA 40-59%, right ICA 1-39%), BPH, and hard of hearing who presented to [facility] for evaluation of Joshua onset right facial droop.  History is supplemented by patient's partner at bedside.  Patient was in his usual state of health with LPN approximately 9 PM 03/04/2020.  This morning he woke up and ran 3 miles per his usual daily activities.  When he returned home his partner noticed significant right facial droop.  Patient had associated lightheadedness but denied any other symptoms including dysarthria, nausea, vomiting, numbness/tingling or change in sensation, focal weakness, change in vision, gait instability, chest pain, palpitations, dyspnea, abdominal pain, diarrhea or constipation, or dysuria.  He was taken to [facility] for further evaluation.  Patient also reports several months of lightheadedness occurring shortly after positional changes.  He has been having feelings of anxiousness and his primary care physician has adjusted his medications with recent initiation of sertraline and as needed diazepam.  Patient has known carotid artery stenosis with carotid Dopplers 8/12 showing left ICA 40-59% stenosis in right ICA 1-39% stenosis.  He has been taking aspirin 81 and mg rosuvastatin 10 mg daily.  He is currently in the process of staged dental implants through [facility].  Context.  He says he does take 2000 mg amoxicillin prior to dental procedures.  [facility] Course:  Initial vitals showed BP 120/66, pulse 56, RR 14, temp 98.0 Fahrenheit, SPO2 98% on room air.  Labs showed WBC 6.6, hemoglobin 14.0, platelets  158,000, sodium 139, potassium 4.3, bicarb 27, BUN 20, creatinine 1.01, serum glucose 108, LFTs within normal limits.  Serum ethanol level <10.  UDS positive for benzodiazepines.  Urinalysis negative for UTI.  SARS-CoV-2 PCR is negative.  CT head without contrast is negative for acute intracranial abnormality.  Teleneurology were consulted and recommended initiating Plavix 75 mg daily with ASA 81 mg DAPT x21 days.  Medical admission for further stroke work-up with MRI head, MRA head/neck, TTE was recommended.  Patient was given 300 mg Plavix load and aspirin 324 mg.  The hospitalist service was consulted for further evaluation and management.  Review of Systems: All systems reviewed and are negative except as documented in history of present illness above.  **Interim History Cardiology, neurology and vascular surgery involved in the patient's care.  Cardiology felt the patient was acceptable risk for preoperative clearance and vascular surgery discussed the options of carotid endarterectomy versus a TCAR and patient is to discuss this further before making a decision.  Neurology recommending dual antiplatelet therapy.  Assessment & Plan:   Principal Problem:   Facial droop Active Problems:   Benign prostatic hyperplasia with nocturia   Sinus bradycardia   Carotid artery stenosis   S/P AVR (aortic valve replacement)   Mild CAD  Acute right facial droop: -Admitted for stroke evaluation.   -CT head is negative.   -Teleneurology was consulted and patient has been loaded with Plavix 300 mg and aspirin 324 mg.  -He takes aspirin 81 mg and rosuvastatin 10 mg daily as an outpatient.  Discussed with on-call neurology, Dr. Rory [facility] who recommends obtaining CTA head and neck  instead of MRI at this time. -MRI brain showed "No acute intracranial abnormality. Generalized age-related cerebral atrophy with mild chronic small vessel ischemic disease." -CTA head and neck showed "Negative CTA for emergent  large vessel occlusion. 75% atheromatous stenosis about the left carotid bifurcation. 50% stenosis at the origin of the right subclavian artery. Moderate approximate 50% stenosis at the origin of the left vertebral artery. Additional mild scattered atherosclerotic change elsewhere about the major arterial vasculature of the head and neck as above. No other hemodynamically significant or correctable stenosis." -Echocardiogram done and showed EF of 50-55% with mild concentric LVH and Paradoxical Spetal motion and Grade 1 DD  -Monitor on telemetry, continue neurochecks -Continue with DAPT aspirin 81 mg daily Plavix 75 mg daily beginning tomorrow -Increased home rosuvastatin to 20 mg daily -Allow permissive hypertension up to 220/110 -Check A1c and was 5.2, lipid panel done and showed a total cholesterol/HDL ratio 2.3, cholesterol level 109, HDL 47, LDL 52, triglycerides of 49, VLDL 10 -PT/OT/SLP eval; PT and OT recommend no F/U and SLP recommending outpatient SLP  -Neurology to follow, appreciate assistance and they recommended obtaining a vascular surgery consultation as they feel the patient had symptomatic left ICA stenosis and wanted help with vascular surgery opinion for a CEA -Cardiology was consulted for preoperative clearance in case she does need a CEA and he has an acceptable risk -Vascular surgery evaluating and recommending either CEA for reduction of stroke risk versus TCAR and patient is to discuss this further prior to making decision.  If he does go for surgery it can be done as inpatient or he can be discharged with a brief interval and readmitted for endarterectomy but the timing will be left to the vascular surgery team -Neurology still following and  Orthostatic symptoms: -Check orthostatic vitals -Hold home finasteride for now  Carotid artery stenosis: -Follows with vascular surgery, [facility]. 8/12 carotid Dopplers show left ICA stenosis 40-59%, right ICA 1-39%.   -Follow-up  MRI studies as above.   -Continue dual antiplatelet therapy with aspirin and Plavix; rosuvastatin as above. -CTA of the Head and Neck showed "Negative CTA for emergent large vessel occlusion. 75% atheromatous stenosis about the left carotid bifurcation. 50% stenosis at the origin of the right subclavian artery. Moderate approximate 50% stenosis at the origin of the left vertebral artery. Additional mild scattered atherosclerotic change elsewhere about the major arterial vasculature of the head and neck as above. No other hemodynamically significant or correctable stenosis."  Sinus Bradycardia -Chronic and at patient's baseline.  He is an avid Production designer, theatre/television/film.  Mild CAD Severe AS s/p tissue AVR: -Chronic and stable.  Follows with cardiology, Dr. Angelena Form cardiothoracic surgery Dr. Cyndia Bent. -Continue Aspirin, Plavix, rosuvastatin as above  BPH: -Was Holding home finasteride for now as above.  Depression/Anxiety: -Continue sertraline. -Resume Home Valium and Trazodone    DVT prophylaxis: Enoxaparin 40 mg sq q24h Code Status: DO NOT RESUSCITATE  Family Communication: Spoke to Husband at bedside  Disposition Plan: Pending further neurological clearance as well as vascular clearance and will need to check orthostatic vital signs prior to discharge  Status is: Inpatient  Remains inpatient appropriate because:Ongoing diagnostic testing needed not appropriate for outpatient work up, Unsafe d/c plan and Inpatient level of care appropriate due to severity of illness   Dispo: The patient is from: Home              Anticipated d/c is to: Home  Anticipated d/c date is: 2 days              Patient currently is not medically stable to d/c.   Consultants:   Neurology  Vascular Surgery  Cardiology    Procedures:  ECHOCARDIOGRAM IMPRESSIONS    1. Left ventricular ejection fraction, by estimation, is 50 to 55%. The  left ventricle has low normal function. The left ventricle  has no regional  wall motion abnormalities. There is mild concentric left ventricular  hypertrophy. Left ventricular  diastolic parameters are consistent with Grade I diastolic dysfunction  (impaired relaxation).  2. Right ventricular systolic function is low normal. The right  ventricular size is mildly enlarged. There is normal pulmonary artery  systolic pressure. The estimated right ventricular systolic pressure is  99.8 mmHg.  3. Left atrial size was mild to moderately dilated.  4. Right atrial size was mildly dilated.  5. The mitral valve is grossly normal. Mild mitral valve regurgitation.  No evidence of mitral stenosis.  6. 23 mm surgical prosthetic valve present with V max 2.8 m/s, MG 17  mmHG, EOA 1.40 cm2. The aortic valve has been repaired/replaced. Aortic  valve regurgitation is not visualized. No aortic stenosis is present.  There is a 23 mm bioprosthetic valve  present in the aortic position. Procedure Date: 11/10/2018. Echo findings  are consistent with normal structure and function of the aortic valve  prosthesis.  7. The inferior vena cava is normal in size with greater than 50%  respiratory variability, suggesting right atrial pressure of 3 mmHg.   Comparison(s): No significant change from prior study.   Conclusion(s)/Recommendation(s): No intracardiac source of embolism  detected on this transthoracic study. A transesophageal echocardiogram is  recommended to exclude cardiac source of embolism if clinically indicated.   FINDINGS  Left Ventricle: Left ventricular ejection fraction, by estimation, is 50  to 55%. The left ventricle has low normal function. The left ventricle has  no regional wall motion abnormalities. The left ventricular internal  cavity size was normal in size.  There is mild concentric left ventricular hypertrophy. Abnormal  (paradoxical) septal motion consistent with post-operative status. Left  ventricular diastolic parameters are  consistent with Grade I diastolic  dysfunction (impaired relaxation).   Right Ventricle: The right ventricular size is mildly enlarged. No  increase in right ventricular wall thickness. Right ventricular systolic  function is low normal. There is normal pulmonary artery systolic  pressure. The tricuspid regurgitant velocity is  2.14 m/s, and with an assumed right atrial pressure of 3 mmHg, the  estimated right ventricular systolic pressure is 33.8 mmHg.   Left Atrium: Left atrial size was mild to moderately dilated.   Right Atrium: Right atrial size was mildly dilated.   Pericardium: There is no evidence of pericardial effusion.   Mitral Valve: The mitral valve is grossly normal. Mild mitral valve  regurgitation. No evidence of mitral valve stenosis.   Tricuspid Valve: The tricuspid valve is grossly normal. Tricuspid valve  regurgitation is mild . No evidence of tricuspid stenosis.   Aortic Valve: 23 mm surgical prosthetic valve present with V max 2.8 m/s,  MG 17 mmHG, EOA 1.40 cm2. The aortic valve has been repaired/replaced.  Aortic valve regurgitation is not visualized. No aortic stenosis is  present. Aortic valve mean gradient  measures 17.0 mmHg. Aortic valve peak gradient measures 32.7 mmHg. Aortic  valve area, by VTI measures 1.40 cm. There is a 23 mm bioprosthetic valve  present in the aortic position. Procedure  Date: 11/10/2018. Echo findings  are consistent with normal  structure and function of the aortic valve prosthesis.   Pulmonic Valve: The pulmonic valve was grossly normal. Pulmonic valve  regurgitation is not visualized. No evidence of pulmonic stenosis.   Aorta: The aortic root is normal in size and structure.   Venous: The inferior vena cava is normal in size with greater than 50%  respiratory variability, suggesting right atrial pressure of 3 mmHg.   IAS/Shunts: The atrial septum is grossly normal.   EKG: Rhythm strip during this exam demostrated sinus  bradycardia and  premature ventricular contractions.     LEFT VENTRICLE  PLAX 2D  LVIDd:     5.04 cm Diastology  LVIDs:     3.65 cm LV e' lateral:  6.20 cm/s  LV PW:     1.18 cm LV E/e' lateral: 10.0  LV IVS:    1.21 cm LV e' medial:  4.24 cm/s  LVOT diam:   2.20 cm LV E/e' medial: 14.6  LV SV:     89  LV SV Index:  46  LVOT Area:   3.80 cm     RIGHT VENTRICLE  RV S prime:   11.50 cm/s  TAPSE (M-mode): 1.3 cm   LEFT ATRIUM       Index    RIGHT ATRIUM      Index  LA diam:    4.60 cm 2.38 cm/m RA Area:   27.80 cm  LA Vol (A2C):  128.0 ml 66.21 ml/m RA Volume:  93.60 ml 48.41 ml/m  LA Vol (A4C):  68.6 ml 35.48 ml/m  LA Biplane Vol: 97.5 ml 50.43 ml/m  AORTIC VALVE  AV Area (Vmax):  1.27 cm  AV Area (Vmean):  1.40 cm  AV Area (VTI):   1.40 cm  AV Vmax:      286.00 cm/s  AV Vmean:     194.000 cm/s  AV VTI:      0.634 m  AV Peak Grad:   32.7 mmHg  AV Mean Grad:   17.0 mmHg  LVOT Vmax:     95.80 cm/s  LVOT Vmean:    71.500 cm/s  LVOT VTI:     0.233 m  LVOT/AV VTI ratio: 0.37    AORTA  Ao Root diam: 3.50 cm   MITRAL VALVE        TRICUSPID VALVE  MV Area (PHT): 1.48 cm  TR Peak grad:  18.3 mmHg  MV Decel Time: 511 msec  TR Vmax:    214.00 cm/s  MV E velocity: 61.70 cm/s  MV A velocity: 57.00 cm/s SHUNTS  MV E/A ratio: 1.08    Systemic VTI: 0.23 m               Systemic Diam: 2.20 cm    Antimicrobials:  Anti-infectives (From admission, onward)   None     Subjective: Seen and examined at bedside and he just come back from his echocardiogram.  Still has a persistent facial droop.  No nausea or vomiting.  Felt okay but he was little anxious.  No chest pain, lightheadedness or dizziness.  No other concerns or complaints at this time.  Objective: Vitals:   03/06/20 0106 03/06/20 0316 03/06/20 0753 03/06/20 1119  BP:  (!) 143/98 140/73 (!) 141/72 125/82  Pulse: (!) 59 (!) 50 (!) 48 (!) 49  Resp: 18 16 18 16   Temp: 97.7 F (36.5 C) 98 F (36.7 C) (!) 97.5 F (36.4 C) 98.1 F (36.7  C)  TempSrc: Oral  Oral Oral  SpO2: 97% 97% 100% 97%  Weight:      Height:        Intake/Output Summary (Last 24 hours) at 03/06/2020 1832 Last data filed at 03/06/2020 1500 Gross per 24 hour  Intake 923.3 ml  Output 100 ml  Net 823.3 ml   Filed Weights   03/05/20 1144  Weight: 77.6 kg   Examination: Physical Exam:  Constitutional: WN/WD slightly overweight Caucasian male currently in NAD and appears mildly anxious and comfortable Eyes: Lids and conjunctivae normal, sclerae anicteric  ENMT: External Ears, Nose appear normal. Grossly normal hearing. Neck: Appears normal, supple, no cervical masses, normal ROM, no appreciable thyromegaly; no JVD Respiratory: Diminished to auscultation bilaterally, no wheezing, rales, rhonchi or crackles. Normal respiratory effort and patient is not tachypenic. No accessory muscle use.  Cardiovascular: Bradycardic rate but Sinus, Has a 3/6 murmur. S1 and S2 auscultated. No extremity edema. 2+ pedal pulses. No carotid bruits.  Abdomen: Soft, non-tender, mildly-distended.  Bowel sounds positive.  GU: Deferred. Musculoskeletal: No clubbing / cyanosis of digits/nails. No joint deformity upper and lower extremities.  Skin: No rashes, lesions, ulcers on a limited skin evaluation. No induration; Warm and dry.  Neurologic: CN 2-12 grossly intact with no focal deficits. Romberg sign cerebellar reflexes not assessed.  Psychiatric: Normal judgment and insight. Alert and oriented x 3. Mildly anxious mood and appropriate affect.   Data Reviewed: I have personally reviewed following labs and imaging studies  CBC: Recent Labs  Lab 03/05/20 1209 03/06/20 0158  WBC 6.6 6.7  NEUTROABS 3.8  --   HGB 14.0 14.5  HCT 41.9 42.9  MCV 97.9 96.6  PLT 158 625   Basic Metabolic Panel: Recent Labs    Lab 03/05/20 1209 03/06/20 0158  NA 139 139  K 4.3 4.0  CL 103 104  CO2 27 28  GLUCOSE 108* 76  BUN 20 18  CREATININE 1.01 0.86  CALCIUM 8.7* 8.7*   GFR: Estimated Creatinine Clearance: 69.6 mL/min (by C-G formula based on SCr of 0.86 mg/dL). Liver Function Tests: Recent Labs  Lab 03/05/20 1209  AST 22  ALT 21  ALKPHOS 45  BILITOT 0.6  PROT 6.5  ALBUMIN 3.7   No results for input(s): LIPASE, AMYLASE in the last 168 hours. No results for input(s): AMMONIA in the last 168 hours. Coagulation Profile: Recent Labs  Lab 03/05/20 1209  INR 1.1   Cardiac Enzymes: No results for input(s): CKTOTAL, CKMB, CKMBINDEX, TROPONINI in the last 168 hours. BNP (last 3 results) No results for input(s): PROBNP in the last 8760 hours. HbA1C: Recent Labs    03/06/20 0158  HGBA1C 5.2   CBG: No results for input(s): GLUCAP in the last 168 hours. Lipid Profile: Recent Labs    03/06/20 0158  CHOL 109  HDL 47  LDLCALC 52  TRIG 49  CHOLHDL 2.3   Thyroid Function Tests: Recent Labs    03/06/20 0158  TSH 3.435   Anemia Panel: No results for input(s): VITAMINB12, FOLATE, FERRITIN, TIBC, IRON, RETICCTPCT in the last 72 hours. Sepsis Labs: No results for input(s): PROCALCITON, LATICACIDVEN in the last 168 hours.  Recent Results (from the past 240 hour(s))  SARS Coronavirus 2 by RT PCR ([facility] order, performed in [facility]) Nasopharyngeal Nasopharyngeal Swab     Status: None   Collection Time: 03/05/20  2:50 PM   Specimen: Nasopharyngeal Swab  Result Value Ref Range Status   SARS Coronavirus 2 NEGATIVE  NEGATIVE Final    Comment: (NOTE) SARS-CoV-2 target nucleic acids are NOT DETECTED.  The SARS-CoV-2 RNA is generally detectable in upper and lower respiratory specimens during the acute phase of infection. The lowest concentration of SARS-CoV-2 viral copies this assay can detect is 250 copies / mL. A negative result does not preclude SARS-CoV-2  infection and should not be used as the sole basis for treatment or other patient management decisions.  A negative result may occur with improper specimen collection / handling, submission of specimen other than nasopharyngeal swab, presence of viral mutation(s) within the areas targeted by this assay, and inadequate number of viral copies (<250 copies / mL). A negative result must be combined with clinical observations, patient history, and epidemiological information.  Fact Sheet for Patients:   StrictlyIdeas.no  Fact Sheet for Healthcare Providers: BankingDealers.co.za  This test is not yet approved or  cleared by the [place] FDA and has been authorized for detection and/or diagnosis of SARS-CoV-2 by FDA under an Emergency Use Authorization (EUA).  This EUA will remain in effect (meaning this test can be used) for the duration of the COVID-19 declaration under Section 564(b)(1) of the Act, 21 U.S.C. section 360bbb-3(b)(1), unless the authorization is terminated or revoked sooner.  Performed at [facility], [address] [place], [place] [address]      RN Pressure Injury Documentation:     Estimated body mass index is 25.26 kg/m as calculated from the following:   Height as of this encounter: 5\' 9"  (1.753 m).   Weight as of this encounter: 77.6 kg.  Malnutrition Type:      Malnutrition Characteristics:      Nutrition Interventions:    Radiology Studies: CT HEAD WO CONTRAST  Result Date: 03/05/2020 CLINICAL DATA:  Acute neuro deficit. Facial droop today. Dizziness. EXAM: CT HEAD WITHOUT CONTRAST TECHNIQUE: Contiguous axial images were obtained from the base of the skull through the vertex without intravenous contrast. COMPARISON:  CT head 11/18/2019 FINDINGS: Brain: Ventricle size and cerebral volume normal for age. Negative for acute infarct, hemorrhage, mass Vascular: Negative for hyperdense  vessel Skull: Negative Sinuses/Orbits: Paranasal sinuses clear.  No orbital lesion. Other: None IMPRESSION: No acute intracranial abnormality.  Negative for age. Electronically Signed   By: Franchot Gallo M.D.   On: 03/05/2020 12:53   MR BRAIN WO CONTRAST  Result Date: 03/06/2020 CLINICAL DATA:  Initial evaluation for neuro deficit, stroke suspected. EXAM: MRI HEAD WITHOUT CONTRAST TECHNIQUE: Multiplanar, multiecho pulse sequences of the brain and surrounding structures were obtained without intravenous contrast. COMPARISON:  Prior head CT from earlier the same day. FINDINGS: Brain: Generalized age-related cerebral atrophy. Scattered patchy T2/FLAIR hyperintensity within the periventricular and deep white matter both cerebral hemispheres most consistent with chronic small vessel ischemic disease, mild for age. No definite abnormal foci of restricted diffusion to suggest acute or subacute ischemia. Please note evaluation is somewhat limited due to motion artifact and poor resolution on axial DWI sequence. Gray-white matter differentiation maintained. No encephalomalacia to suggest chronic cortical infarction. No acute intracranial hemorrhage. Single punctate chronic microhemorrhage noted within the left frontal lobe, of doubtful significance in isolation. No mass lesion, midline shift or mass effect. No hydrocephalus or extra-axial fluid collection. Pituitary gland suprasellar region normal. Midline structures intact. Vascular: Major intracranial vascular flow voids are maintained. Skull and upper cervical spine: Craniocervical junction normal. Bone marrow signal intensity within normal limits. No scalp soft tissue abnormality. Sinuses/Orbits: Globes and orbital soft tissues within normal limits.  Paranasal sinuses are largely clear. No mastoid effusion. Inner ear structures grossly normal. Other: None. IMPRESSION: 1. No acute intracranial abnormality. 2. Generalized age-related cerebral atrophy with mild chronic  small vessel ischemic disease. Electronically Signed   By: Jeannine Boga M.D.   On: 03/06/2020 00:21   ECHOCARDIOGRAM COMPLETE  Result Date: 03/06/2020    ECHOCARDIOGRAM REPORT   Patient Name:   Joshua [facility] Date of Exam: 03/06/2020 Medical Rec #:  [id number]     Height:       69.0 in Accession #:    [id number]    Weight:       171.1 lb Date of Birth:  Nov 15, 1940      BSA:          1.933 m Patient Age:    56 years      BP:           141/72 mmHg Patient Gender: M             HR:           56 bpm. Exam Location:  Inpatient Procedure: 2D Echo Indications:    Stroke I163.9  History:        Patient has prior history of Echocardiogram examinations, most                 recent 01/26/2019. CAD.                 Aortic Valve: 23 mm bioprosthetic valve is present in the aortic                 position. Procedure Date: 11/10/2018.  Sonographer:    Mikki Santee RDCS (AE) Referring Phys: [id number] [place]  1. Left ventricular ejection fraction, by estimation, is 50 to 55%. The left ventricle has low normal function. The left ventricle has no regional wall motion abnormalities. There is mild concentric left ventricular hypertrophy. Left ventricular diastolic parameters are consistent with Grade I diastolic dysfunction (impaired relaxation).  2. Right ventricular systolic function is low normal. The right ventricular size is mildly enlarged. There is normal pulmonary artery systolic pressure. The estimated right ventricular systolic pressure is 29.5 mmHg.  3. Left atrial size was mild to moderately dilated.  4. Right atrial size was mildly dilated.  5. The mitral valve is grossly normal. Mild mitral valve regurgitation. No evidence of mitral stenosis.  6. 23 mm surgical prosthetic valve present with V max 2.8 m/s, MG 17 mmHG, EOA 1.40 cm2. The aortic valve has been repaired/replaced. Aortic valve regurgitation is not visualized. No aortic stenosis is present. There is a 23 mm bioprosthetic valve present  in the aortic position. Procedure Date: 11/10/2018. Echo findings are consistent with normal structure and function of the aortic valve prosthesis.  7. The inferior vena cava is normal in size with greater than 50% respiratory variability, suggesting right atrial pressure of 3 mmHg. Comparison(s): No significant change from prior study. Conclusion(s)/Recommendation(s): No intracardiac source of embolism detected on this transthoracic study. A transesophageal echocardiogram is recommended to exclude cardiac source of embolism if clinically indicated. FINDINGS  Left Ventricle: Left ventricular ejection fraction, by estimation, is 50 to 55%. The left ventricle has low normal function. The left ventricle has no regional wall motion abnormalities. The left ventricular internal cavity size was normal in size. There is mild concentric left ventricular hypertrophy. Abnormal (paradoxical) septal motion consistent with post-operative status. Left ventricular diastolic parameters are consistent with Grade I diastolic dysfunction (impaired  relaxation). Right Ventricle: The right ventricular size is mildly enlarged. No increase in right ventricular wall thickness. Right ventricular systolic function is low normal. There is normal pulmonary artery systolic pressure. The tricuspid regurgitant velocity is 2.14 m/s, and with an assumed right atrial pressure of 3 mmHg, the estimated right ventricular systolic pressure is 98.9 mmHg. Left Atrium: Left atrial size was mild to moderately dilated. Right Atrium: Right atrial size was mildly dilated. Pericardium: There is no evidence of pericardial effusion. Mitral Valve: The mitral valve is grossly normal. Mild mitral valve regurgitation. No evidence of mitral valve stenosis. Tricuspid Valve: The tricuspid valve is grossly normal. Tricuspid valve regurgitation is mild . No evidence of tricuspid stenosis. Aortic Valve: 23 mm surgical prosthetic valve present with V max 2.8 m/s, MG 17 mmHG,  EOA 1.40 cm2. The aortic valve has been repaired/replaced. Aortic valve regurgitation is not visualized. No aortic stenosis is present. Aortic valve mean gradient measures 17.0 mmHg. Aortic valve peak gradient measures 32.7 mmHg. Aortic valve area, by VTI measures 1.40 cm. There is a 23 mm bioprosthetic valve present in the aortic position. Procedure Date: 11/10/2018. Echo findings are consistent with normal structure and function of the aortic valve prosthesis. Pulmonic Valve: The pulmonic valve was grossly normal. Pulmonic valve regurgitation is not visualized. No evidence of pulmonic stenosis. Aorta: The aortic root is normal in size and structure. Venous: The inferior vena cava is normal in size with greater than 50% respiratory variability, suggesting right atrial pressure of 3 mmHg. IAS/Shunts: The atrial septum is grossly normal. EKG: Rhythm strip during this exam demostrated sinus bradycardia and premature ventricular contractions.  LEFT VENTRICLE PLAX 2D LVIDd:         5.04 cm  Diastology LVIDs:         3.65 cm  LV e' lateral:   6.20 cm/s LV PW:         1.18 cm  LV E/e' lateral: 10.0 LV IVS:        1.21 cm  LV e' medial:    4.24 cm/s LVOT diam:     2.20 cm  LV E/e' medial:  14.6 LV SV:         89 LV SV Index:   46 LVOT Area:     3.80 cm  RIGHT VENTRICLE RV S prime:     11.50 cm/s TAPSE (M-mode): 1.3 cm LEFT ATRIUM              Index       RIGHT ATRIUM           Index LA diam:        4.60 cm  2.38 cm/m  RA Area:     27.80 cm LA Vol (A2C):   128.0 ml 66.21 ml/m RA Volume:   93.60 ml  48.41 ml/m LA Vol (A4C):   68.6 ml  35.48 ml/m LA Biplane Vol: 97.5 ml  50.43 ml/m  AORTIC VALVE AV Area (Vmax):    1.27 cm AV Area (Vmean):   1.40 cm AV Area (VTI):     1.40 cm AV Vmax:           286.00 cm/s AV Vmean:          194.000 cm/s AV VTI:            0.634 m AV Peak Grad:      32.7 mmHg AV Mean Grad:      17.0 mmHg LVOT Vmax:  95.80 cm/s LVOT Vmean:        71.500 cm/s LVOT VTI:          0.233 m LVOT/AV  VTI ratio: 0.37  AORTA Ao Root diam: 3.50 cm MITRAL VALVE               TRICUSPID VALVE MV Area (PHT): 1.48 cm    TR Peak grad:   18.3 mmHg MV Decel Time: 511 msec    TR Vmax:        214.00 cm/s MV E velocity: 61.70 cm/s MV A velocity: 57.00 cm/s  SHUNTS MV E/A ratio:  1.08        Systemic VTI:  0.23 m                            Systemic Diam: 2.20 cm Eleonore Chiquito MD Electronically signed by Eleonore Chiquito MD Signature Date/Time: 03/06/2020/12:23:13 PM    Final    CT ANGIO HEAD CODE STROKE  Result Date: 03/06/2020 CLINICAL DATA:  Initial evaluation for neuro deficit, stroke suspected. EXAM: CT ANGIOGRAPHY HEAD AND NECK TECHNIQUE: Multidetector CT imaging of the head and neck was performed using the standard protocol during bolus administration of intravenous contrast. Multiplanar CT image reconstructions and MIPs were obtained to evaluate the vascular anatomy. Carotid stenosis measurements (when applicable) are obtained utilizing NASCET criteria, using the distal internal carotid diameter as the denominator. CONTRAST:  94mL OMNIPAQUE IOHEXOL 350 MG/ML SOLN COMPARISON:  Prior head CT from earlier the same day. FINDINGS: CTA NECK FINDINGS Aortic arch: Visualized aortic arch normal in caliber with normal branch pattern. Mild atheromatous change about the arch itself. No visible hemodynamically significant stenosis about the origin of the great vessels. Right carotid system: Scattered eccentric calcified plaque within the right CCA without flow-limiting stenosis. Additional eccentric calcified plaque at the origin of the right ICA without significant stenosis. Right ICA patent distally to the skull base without stenosis, dissection or occlusion. Left carotid system: Left CCA widely patent proximally. Concentric mixed plaque about the left carotid bifurcation with associated stenosis of up to 75% by NASCET criteria (series 8, image 233). Left ICA patent distally to the skull base without stenosis, dissection or  occlusion. Vertebral arteries: Both vertebral arteries arise from the subclavian arteries. Eccentric calcified plaque at the origin of the right subclavian artery with approximate 50% stenosis (series 8, image 78). Moderate approximate 50% stenosis at the origin of the left vertebral artery (series 8, image 114). Vertebral arteries otherwise widely patent within the neck without stenosis, dissection or occlusion. Skeleton: No acute osseous abnormality. Sclerotic lesion within the C3 spinous process most consistent with a small benign bone island. No other discrete or worrisome osseous lesions. Mild multilevel cervical spondylosis without high-grade stenosis. Other neck: No other acute soft tissue abnormality within the neck. No mass lesion or adenopathy. Upper chest: Visualized upper chest demonstrates no acute finding. Review of the MIP images confirms the above findings CTA HEAD FINDINGS Anterior circulation: Petrous segments widely patent bilaterally. Scattered atheromatous plaque throughout the carotid siphons with associated mild multifocal narrowing. ICA termini well perfused. A1 segments widely patent. Normal anterior communicating artery complex. Anterior cerebral arteries widely patent to their distal aspects without stenosis. No M1 stenosis or occlusion. Normal MCA bifurcations. Distal MCA branches well perfused and symmetric. Posterior circulation: Vertebral arteries patent to the vertebrobasilar junction without stenosis. Mild non stenotic plaque noted within the proximal left V4 segment. Left PICA patent. Right PICA  not seen. Basilar patent to its distal aspect without stenosis. Superior cerebral arteries patent bilaterally. Both PCAs primarily supplied via the basilar perfused to their distal aspects without stenosis. Venous sinuses: Patent allowing for timing the contrast bolus. Anatomic variants: None significant.  No intracranial aneurysm. Review of the MIP images confirms the above findings  IMPRESSION: 1. Negative CTA for emergent large vessel occlusion. 2. 75% atheromatous stenosis about the left carotid bifurcation. 3. 50% stenosis at the origin of the right subclavian artery. 4. Moderate approximate 50% stenosis at the origin of the left vertebral artery. 5. Additional mild scattered atherosclerotic change elsewhere about the major arterial vasculature of the head and neck as above. No other hemodynamically significant or correctable stenosis. Electronically Signed   By: Jeannine Boga M.D.   On: 03/06/2020 00:46   CT ANGIO NECK CODE STROKE  Result Date: 03/06/2020 CLINICAL DATA:  Initial evaluation for neuro deficit, stroke suspected. EXAM: CT ANGIOGRAPHY HEAD AND NECK TECHNIQUE: Multidetector CT imaging of the head and neck was performed using the standard protocol during bolus administration of intravenous contrast. Multiplanar CT image reconstructions and MIPs were obtained to evaluate the vascular anatomy. Carotid stenosis measurements (when applicable) are obtained utilizing NASCET criteria, using the distal internal carotid diameter as the denominator. CONTRAST:  25mL OMNIPAQUE IOHEXOL 350 MG/ML SOLN COMPARISON:  Prior head CT from earlier the same day. FINDINGS: CTA NECK FINDINGS Aortic arch: Visualized aortic arch normal in caliber with normal branch pattern. Mild atheromatous change about the arch itself. No visible hemodynamically significant stenosis about the origin of the great vessels. Right carotid system: Scattered eccentric calcified plaque within the right CCA without flow-limiting stenosis. Additional eccentric calcified plaque at the origin of the right ICA without significant stenosis. Right ICA patent distally to the skull base without stenosis, dissection or occlusion. Left carotid system: Left CCA widely patent proximally. Concentric mixed plaque about the left carotid bifurcation with associated stenosis of up to 75% by NASCET criteria (series 8, image 233). Left  ICA patent distally to the skull base without stenosis, dissection or occlusion. Vertebral arteries: Both vertebral arteries arise from the subclavian arteries. Eccentric calcified plaque at the origin of the right subclavian artery with approximate 50% stenosis (series 8, image 78). Moderate approximate 50% stenosis at the origin of the left vertebral artery (series 8, image 114). Vertebral arteries otherwise widely patent within the neck without stenosis, dissection or occlusion. Skeleton: No acute osseous abnormality. Sclerotic lesion within the C3 spinous process most consistent with a small benign bone island. No other discrete or worrisome osseous lesions. Mild multilevel cervical spondylosis without high-grade stenosis. Other neck: No other acute soft tissue abnormality within the neck. No mass lesion or adenopathy. Upper chest: Visualized upper chest demonstrates no acute finding. Review of the MIP images confirms the above findings CTA HEAD FINDINGS Anterior circulation: Petrous segments widely patent bilaterally. Scattered atheromatous plaque throughout the carotid siphons with associated mild multifocal narrowing. ICA termini well perfused. A1 segments widely patent. Normal anterior communicating artery complex. Anterior cerebral arteries widely patent to their distal aspects without stenosis. No M1 stenosis or occlusion. Normal MCA bifurcations. Distal MCA branches well perfused and symmetric. Posterior circulation: Vertebral arteries patent to the vertebrobasilar junction without stenosis. Mild non stenotic plaque noted within the proximal left V4 segment. Left PICA patent. Right PICA not seen. Basilar patent to its distal aspect without stenosis. Superior cerebral arteries patent bilaterally. Both PCAs primarily supplied via the basilar perfused to their distal aspects without stenosis. Venous  sinuses: Patent allowing for timing the contrast bolus. Anatomic variants: None significant.  No intracranial  aneurysm. Review of the MIP images confirms the above findings IMPRESSION: 1. Negative CTA for emergent large vessel occlusion. 2. 75% atheromatous stenosis about the left carotid bifurcation. 3. 50% stenosis at the origin of the right subclavian artery. 4. Moderate approximate 50% stenosis at the origin of the left vertebral artery. 5. Additional mild scattered atherosclerotic change elsewhere about the major arterial vasculature of the head and neck as above. No other hemodynamically significant or correctable stenosis. Electronically Signed   By: Jeannine Boga M.D.   On: 03/06/2020 00:46   Scheduled Meds: . aspirin EC  81 mg Oral Daily  . clopidogrel  75 mg Oral Daily  . enoxaparin (LOVENOX) injection  40 mg Subcutaneous Q24H  . rosuvastatin  20 mg Oral Daily  . sertraline  100 mg Oral Daily  . traZODone  50-100 mg Oral QHS   Continuous Infusions:   LOS: 0 days   Kerney Elbe, DO [facility] PAGER is on AMION  If 7PM-7AM, please contact night-coverage [email or web address]

## 2020-03-06 NOTE — Evaluation (Signed)
Physical Therapy Evaluation Patient Details Name: Joshua Hess MRN: [id number] DOB: [birth year] Today's Date: 03/06/2020   History of Present Illness  Patient is a 79 y/o male with PMH of severe AS s/p tissue AVR April 2020, mild nonobstructive CAD, sinus bradycardia, CAD, HOH presenting to [facility] ED for evaluation of new onset R facial droop. CT negative for acute intracranial abnormality.   Clinical Impression  Pt admitted with/for facial droop but imaging not showing any abnormality.  Pt generally at a S to Modified independent level in a homelike environment..  Pt currently limited functionally due to the problems listed below.  (see problems list.)  Pt will benefit from PT to maximize function and safety to be able to get home safely with available assist .      Follow Up Recommendations No PT follow up;Supervision - Intermittent    Equipment Recommendations  None recommended by PT    Recommendations for Other Services       Precautions / Restrictions Precautions Precautions: Fall      Mobility  Bed Mobility Overal bed mobility: Independent                Transfers Overall transfer level: Needs assistance   Transfers: Sit to/from Stand Sit to Stand: Supervision         General transfer comment: basically independent in this supervised environment  Ambulation/Gait Ambulation/Gait assistance: Supervision Gait Distance (Feet): 400 Feet Assistive device: None Gait Pattern/deviations: Step-through pattern   Gait velocity interpretation: >2.62 ft/sec, indicative of community ambulatory General Gait Details: safe, steady, age appropriate speeds.  Stairs Stairs: Yes Stairs assistance: Modified independent (Device/Increase time) Stair Management: One rail Right;Alternating pattern;Forwards Number of Stairs: 3 General stair comments: safe and steady with rail  Wheelchair Mobility    Modified Rankin (Stroke Patients Only)       Balance  Overall balance assessment: Needs assistance Sitting-balance support: No upper extremity supported;Feet supported Sitting balance-Leahy Scale: Good     Standing balance support: No upper extremity supported;During functional activity Standing balance-Leahy Scale: Good Standing balance comment: pt with 1 deviation during dynamic gait that he easily recovered from.                 Standardized Balance Assessment Standardized Balance Assessment : Berg Balance Test;Dynamic Gait Index Berg Balance Test Sit to Stand: Able to stand without using hands and stabilize independently Standing Unsupported: Able to stand safely 2 minutes Sitting with Back Unsupported but Feet Supported on Floor or Stool: Able to sit safely and securely 2 minutes Stand to Sit: Sits safely with minimal use of hands Transfers: Able to transfer safely, minor use of hands Standing Unsupported with Eyes Closed: Able to stand 10 seconds with supervision Standing Ubsupported with Feet Together: Able to place feet together independently and stand for 1 minute with supervision From Standing, Reach Forward with Outstretched Arm: Can reach forward >12 cm safely (5") From Standing Position, Pick up Object from Floor: Able to pick up shoe safely and easily From Standing Position, Turn to Look Behind Over each Shoulder: Looks behind from both sides and weight shifts well Turn 360 Degrees: Able to turn 360 degrees safely in 4 seconds or less Standing Unsupported, Alternately Place Feet on Step/Stool: Able to stand independently and complete 8 steps >20 seconds Standing Unsupported, One Foot in Front: Able to plae foot ahead of the other independently and hold 30 seconds Standing on One Leg: Able to lift leg independently and hold 5-10 seconds  Total Score: 50 Dynamic Gait Index Level Surface: Normal Change in Gait Speed: Normal Gait with Horizontal Head Turns: Normal Gait with Vertical Head Turns: Normal Gait and Pivot Turn:  Normal Step Over Obstacle: Mild Impairment Step Around Obstacles: Mild Impairment Steps: Mild Impairment Total Score: 21       Pertinent Vitals/Pain      Home Living Family/patient expects to be discharged to:: Private residence Living Arrangements: Spouse/significant other (partner ) Available Help at Discharge: Family;Available 24 hours/day Type of Home: House Home Access: Stairs to enter Entrance Stairs-Rails: Can reach both Entrance Stairs-Number of Steps: 2-3 Home Layout: Two level;Able to live on main level with bedroom/bathroom Home Equipment: Gilford Rile - 2 wheels;Cane - single point;Shower seat      Prior Function Level of Independence: Independent         Comments: walks 3-5 miles daily; hx of running over 200 marathons; reports recent hx of decreased STM, partner assists with med mgmt      Hand Dominance   Dominant Hand: Right    Extremity/Trunk Assessment   Upper Extremity Assessment Upper Extremity Assessment: Defer to OT evaluation    Lower Extremity Assessment Lower Extremity Assessment: Overall WFL for tasks assessed       Communication   Communication: No difficulties  Cognition Arousal/Alertness: Awake/alert Behavior During Therapy: Anxious (mildly anxious, eager to get out of the hospital ) Overall Cognitive Status: History of cognitive impairments - at baseline Area of Impairment: Memory;Following commands;Problem solving;Awareness;Safety/judgement                     Memory: Decreased short-term memory Following Commands: Follows multi-step commands with increased time Safety/Judgement: Decreased awareness of deficits Awareness: Emergent Problem Solving: Slow processing;Difficulty sequencing;Requires verbal cues General Comments: Short blessed test: 12/28 impairment consistent with dementia with deficits specifically seen in sequencing and STM.  Patient presenting with difficulty describing cognitive deficits, but partner provides  hx of encephalopathy recently with difficulty problem solving and recall.  Will continue to assess.       General Comments General comments (skin integrity, edema, etc.): lower risk of falling, but noted tendency to list left.  Pt did get dizzy x1, but not within the more complex balance testing.    Exercises     Assessment/Plan    PT Assessment Patient needs continued PT services  PT Problem List Decreased balance       PT Treatment Interventions Gait training;Stair training;Functional mobility training;Therapeutic activities    PT Goals (Current goals can be found in the Care Plan section)  Acute Rehab PT Goals Patient Stated Goal: to get home  PT Goal Formulation: With patient Time For Goal Achievement: 03/13/20 Potential to Achieve Goals: Good    Frequency Min 3X/week   Barriers to discharge        Co-evaluation               AM-PAC PT "6 Clicks" Mobility  Outcome Measure Help needed turning from your back to your side while in a flat bed without using bedrails?: None Help needed moving from lying on your back to sitting on the side of a flat bed without using bedrails?: None Help needed moving to and from a bed to a chair (including a wheelchair)?: None Help needed standing up from a chair using your arms (e.g., wheelchair or bedside chair)?: None Help needed to walk in hospital room?: None Help needed climbing 3-5 steps with a railing? : None 6 Click Score: 24  End of Session   Activity Tolerance: Patient tolerated treatment well Patient left: with call bell/phone within reach Nurse Communication: Mobility status PT Visit Diagnosis: Unsteadiness on feet (R26.81)    Time: 9579-0092 PT Time Calculation (min) (ACUTE ONLY): 19 min   Charges:   PT Evaluation $PT Eval Low Complexity: 1 Low          03/06/2020  Ginger Carne., PT Acute Rehabilitation Services [phone]  (pager) [phone]  (office)  Tessie Fass Boneta Standre 03/06/2020, 3:15 PM

## 2020-03-06 NOTE — Progress Notes (Signed)
PT Cancellation Note  Patient Details Name: Joshua Hess MRN: [id number] DOB: 1940/10/28   Cancelled Treatment:    Reason Eval/Treat Not Completed: Patient at procedure or test/unavailable.  Pt at ECHO then ?CTA, will see as soon as possible. 03/06/2020  Ginger Carne., PT Acute Rehabilitation Services [phone]  (pager) [phone]  (office)   [name] 03/06/2020, 10:39 AM

## 2020-03-07 LAB — CBC WITH DIFFERENTIAL/PLATELET
Abs Immature Granulocytes: 0.03 10*3/uL (ref 0.00–0.07)
Basophils Absolute: 0 10*3/uL (ref 0.0–0.1)
Basophils Relative: 0 %
Eosinophils Absolute: 0.1 10*3/uL (ref 0.0–0.5)
Eosinophils Relative: 2 %
HCT: 40.7 % (ref 39.0–52.0)
Hemoglobin: 14.4 g/dL (ref 13.0–17.0)
Immature Granulocytes: 0 %
Lymphocytes Relative: 27 %
Lymphs Abs: 1.9 10*3/uL (ref 0.7–4.0)
MCH: 33.8 pg (ref 26.0–34.0)
MCHC: 35.4 g/dL (ref 30.0–36.0)
MCV: 95.5 fL (ref 80.0–100.0)
Monocytes Absolute: 0.7 10*3/uL (ref 0.1–1.0)
Monocytes Relative: 9 %
Neutro Abs: 4.3 10*3/uL (ref 1.7–7.7)
Neutrophils Relative %: 62 %
Platelets: 166 10*3/uL (ref 150–400)
RBC: 4.26 MIL/uL (ref 4.22–5.81)
RDW: 11.8 % (ref 11.5–15.5)
WBC: 7 10*3/uL (ref 4.0–10.5)
nRBC: 0 % (ref 0.0–0.2)

## 2020-03-07 LAB — COMPREHENSIVE METABOLIC PANEL
ALT: 19 U/L (ref 0–44)
AST: 21 U/L (ref 15–41)
Albumin: 3.5 g/dL (ref 3.5–5.0)
Alkaline Phosphatase: 48 U/L (ref 38–126)
Anion gap: 8 (ref 5–15)
BUN: 19 mg/dL (ref 8–23)
CO2: 27 mmol/L (ref 22–32)
Calcium: 9 mg/dL (ref 8.9–10.3)
Chloride: 101 mmol/L (ref 98–111)
Creatinine, Ser: 0.97 mg/dL (ref 0.61–1.24)
GFR calc Af Amer: >60 mL/min (ref >60)
GFR calc non Af Amer: >60 mL/min (ref >60)
Glucose, Bld: 93 mg/dL (ref 70–99)
Potassium: 4.5 mmol/L (ref 3.5–5.1)
Sodium: 136 mmol/L (ref 135–145)
Total Bilirubin: 1.1 mg/dL (ref 0.3–1.2)
Total Protein: 6.2 g/dL — ABNORMAL LOW (ref 6.5–8.1)

## 2020-03-07 LAB — MRSA PCR SCREENING: MRSA by PCR: NEGATIVE

## 2020-03-07 LAB — MAGNESIUM: Magnesium: 1.9 mg/dL (ref 1.7–2.4)

## 2020-03-07 LAB — PHOSPHORUS: Phosphorus: 3.8 mg/dL (ref 2.5–4.6)

## 2020-03-07 MED ORDER — CEFAZOLIN SODIUM-DEXTROSE 2-4 GM/100ML-% IV SOLN
2.0000 g | INTRAVENOUS | Status: AC
  Administered 2020-03-08: 2 g via INTRAVENOUS
  Filled 2020-03-07: qty 100

## 2020-03-07 NOTE — Progress Notes (Signed)
STROKE TEAM PROGRESS NOTE   SUBJECTIVE (INTERVAL HISTORY) His partner is at the bedside.  Overall his condition is stable. He has decided to proceed with left CEA tomorrow with Dr. Donnetta Hutching.  OBJECTIVE Temp:  [97.8 F (36.6 C)-98.3 F (36.8 C)] 98.3 F (36.8 C) (08/26 1135) Pulse Rate:  [52-66] 58 (08/26 1135) Cardiac Rhythm: Sinus bradycardia (08/26 0739) Resp:  [16-18] 16 (08/26 1135) BP: (119-156)/(56-90) 119/90 (08/26 1135) SpO2:  [96 %-100 %] 96 % (08/26 1135)  No results for input(s): GLUCAP in the last 168 hours. Recent Labs  Lab 03/05/20 1209 03/06/20 0158 03/07/20 0346  NA 139 139 136  K 4.3 4.0 4.5  CL 103 104 101  CO2 27 28 27   GLUCOSE 108* 76 93  BUN 20 18 19   CREATININE 1.01 0.86 0.97  CALCIUM 8.7* 8.7* 9.0  MG  --   --  1.9  PHOS  --   --  3.8   Recent Labs  Lab 03/05/20 1209 03/07/20 0346  AST 22 21  ALT 21 19  ALKPHOS 45 48  BILITOT 0.6 1.1  PROT 6.5 6.2*  ALBUMIN 3.7 3.5   Recent Labs  Lab 03/05/20 1209 03/06/20 0158 03/07/20 0346  WBC 6.6 6.7 7.0  NEUTROABS 3.8  --  4.3  HGB 14.0 14.5 14.4  HCT 41.9 42.9 40.7  MCV 97.9 96.6 95.5  PLT 158 162 166   No results for input(s): CKTOTAL, CKMB, CKMBINDEX, TROPONINI in the last 168 hours. Recent Labs    03/05/20 1209  LABPROT 13.3  INR 1.1   Recent Labs    03/05/20 1350  COLORURINE YELLOW  LABSPEC 1.025  PHURINE 5.0  GLUCOSEU NEGATIVE  HGBUR TRACE*  BILIRUBINUR NEGATIVE  KETONESUR NEGATIVE  PROTEINUR NEGATIVE  NITRITE NEGATIVE  LEUKOCYTESUR NEGATIVE       Component Value Date/Time   CHOL 109 03/06/2020 0158   TRIG 49 03/06/2020 0158   HDL 47 03/06/2020 0158   CHOLHDL 2.3 03/06/2020 0158   VLDL 10 03/06/2020 0158   LDLCALC 52 03/06/2020 0158   Lab Results  Component Value Date   HGBA1C 5.2 03/06/2020      Component Value Date/Time   LABOPIA NONE DETECTED 03/05/2020 1350   COCAINSCRNUR NONE DETECTED 03/05/2020 1350   LABBENZ POSITIVE (A) 03/05/2020 1350   AMPHETMU NONE  DETECTED 03/05/2020 1350   THCU NONE DETECTED 03/05/2020 1350   LABBARB NONE DETECTED 03/05/2020 1350    Recent Labs  Lab 03/05/20 1209  ETH <10    I have personally reviewed the radiological images below and agree with the radiology interpretations.  CT HEAD WO CONTRAST  Result Date: 03/05/2020 CLINICAL DATA:  Acute neuro deficit. Facial droop today. Dizziness. EXAM: CT HEAD WITHOUT CONTRAST TECHNIQUE: Contiguous axial images were obtained from the base of the skull through the vertex without intravenous contrast. COMPARISON:  CT head 11/18/2019 FINDINGS: Brain: Ventricle size and cerebral volume normal for age. Negative for acute infarct, hemorrhage, mass Vascular: Negative for hyperdense vessel Skull: Negative Sinuses/Orbits: Paranasal sinuses clear.  No orbital lesion. Other: None IMPRESSION: No acute intracranial abnormality.  Negative for age. Electronically Signed   By: Franchot Gallo M.D.   On: 03/05/2020 12:53   MR BRAIN WO CONTRAST  Result Date: 03/06/2020 CLINICAL DATA:  Initial evaluation for neuro deficit, stroke suspected. EXAM: MRI HEAD WITHOUT CONTRAST TECHNIQUE: Multiplanar, multiecho pulse sequences of the brain and surrounding structures were obtained without intravenous contrast. COMPARISON:  Prior head CT from earlier the same day. FINDINGS:  Brain: Generalized age-related cerebral atrophy. Scattered patchy T2/FLAIR hyperintensity within the periventricular and deep white matter both cerebral hemispheres most consistent with chronic small vessel ischemic disease, mild for age. No definite abnormal foci of restricted diffusion to suggest acute or subacute ischemia. Please note evaluation is somewhat limited due to motion artifact and poor resolution on axial DWI sequence. Gray-white matter differentiation maintained. No encephalomalacia to suggest chronic cortical infarction. No acute intracranial hemorrhage. Single punctate chronic microhemorrhage noted within the left frontal  lobe, of doubtful significance in isolation. No mass lesion, midline shift or mass effect. No hydrocephalus or extra-axial fluid collection. Pituitary gland suprasellar region normal. Midline structures intact. Vascular: Major intracranial vascular flow voids are maintained. Skull and upper cervical spine: Craniocervical junction normal. Bone marrow signal intensity within normal limits. No scalp soft tissue abnormality. Sinuses/Orbits: Globes and orbital soft tissues within normal limits. Paranasal sinuses are largely clear. No mastoid effusion. Inner ear structures grossly normal. Other: None. IMPRESSION: 1. No acute intracranial abnormality. 2. Generalized age-related cerebral atrophy with mild chronic small vessel ischemic disease. Electronically Signed   By: Jeannine Boga M.D.   On: 03/06/2020 00:21   ECHOCARDIOGRAM COMPLETE  Result Date: 03/06/2020    ECHOCARDIOGRAM REPORT   Patient Name:   Joshua Hess Date of Exam: 03/06/2020 Medical Rec #:  [id number]     Height:       69.0 in Accession #:    [id number]    Weight:       171.1 lb Date of Birth:  July 15, 1940      BSA:          1.933 m Patient Age:    41 years      BP:           141/72 mmHg Patient Gender: M             HR:           56 bpm. Exam Location:  Inpatient Procedure: 2D Echo Indications:    Stroke I163.9  History:        Patient has prior history of Echocardiogram examinations, most                 recent 01/26/2019. CAD.                 Aortic Valve: 23 mm bioprosthetic valve is present in the aortic                 position. Procedure Date: 11/10/2018.  Sonographer:    Mikki Santee RDCS (AE) Referring Phys: [id number] [place]  1. Left ventricular ejection fraction, by estimation, is 50 to 55%. The left ventricle has low normal function. The left ventricle has no regional wall motion abnormalities. There is mild concentric left ventricular hypertrophy. Left ventricular diastolic parameters are consistent with Grade I  diastolic dysfunction (impaired relaxation).  2. Right ventricular systolic function is low normal. The right ventricular size is mildly enlarged. There is normal pulmonary artery systolic pressure. The estimated right ventricular systolic pressure is 83.6 mmHg.  3. Left atrial size was mild to moderately dilated.  4. Right atrial size was mildly dilated.  5. The mitral valve is grossly normal. Mild mitral valve regurgitation. No evidence of mitral stenosis.  6. 23 mm surgical prosthetic valve present with V max 2.8 m/s, MG 17 mmHG, EOA 1.40 cm2. The aortic valve has been repaired/replaced. Aortic valve regurgitation is not visualized. No aortic stenosis is present. There is  a 23 mm bioprosthetic valve present in the aortic position. Procedure Date: 11/10/2018. Echo findings are consistent with normal structure and function of the aortic valve prosthesis.  7. The inferior vena cava is normal in size with greater than 50% respiratory variability, suggesting right atrial pressure of 3 mmHg. Comparison(s): No significant change from prior study. Conclusion(s)/Recommendation(s): No intracardiac source of embolism detected on this transthoracic study. A transesophageal echocardiogram is recommended to exclude cardiac source of embolism if clinically indicated. FINDINGS  Left Ventricle: Left ventricular ejection fraction, by estimation, is 50 to 55%. The left ventricle has low normal function. The left ventricle has no regional wall motion abnormalities. The left ventricular internal cavity size was normal in size. There is mild concentric left ventricular hypertrophy. Abnormal (paradoxical) septal motion consistent with post-operative status. Left ventricular diastolic parameters are consistent with Grade I diastolic dysfunction (impaired relaxation). Right Ventricle: The right ventricular size is mildly enlarged. No increase in right ventricular wall thickness. Right ventricular systolic function is low normal. There is  normal pulmonary artery systolic pressure. The tricuspid regurgitant velocity is 2.14 m/s, and with an assumed right atrial pressure of 3 mmHg, the estimated right ventricular systolic pressure is 32.2 mmHg. Left Atrium: Left atrial size was mild to moderately dilated. Right Atrium: Right atrial size was mildly dilated. Pericardium: There is no evidence of pericardial effusion. Mitral Valve: The mitral valve is grossly normal. Mild mitral valve regurgitation. No evidence of mitral valve stenosis. Tricuspid Valve: The tricuspid valve is grossly normal. Tricuspid valve regurgitation is mild . No evidence of tricuspid stenosis. Aortic Valve: 23 mm surgical prosthetic valve present with V max 2.8 m/s, MG 17 mmHG, EOA 1.40 cm2. The aortic valve has been repaired/replaced. Aortic valve regurgitation is not visualized. No aortic stenosis is present. Aortic valve mean gradient measures 17.0 mmHg. Aortic valve peak gradient measures 32.7 mmHg. Aortic valve area, by VTI measures 1.40 cm. There is a 23 mm bioprosthetic valve present in the aortic position. Procedure Date: 11/10/2018. Echo findings are consistent with normal structure and function of the aortic valve prosthesis. Pulmonic Valve: The pulmonic valve was grossly normal. Pulmonic valve regurgitation is not visualized. No evidence of pulmonic stenosis. Aorta: The aortic root is normal in size and structure. Venous: The inferior vena cava is normal in size with greater than 50% respiratory variability, suggesting right atrial pressure of 3 mmHg. IAS/Shunts: The atrial septum is grossly normal. EKG: Rhythm strip during this exam demostrated sinus bradycardia and premature ventricular contractions.  LEFT VENTRICLE PLAX 2D LVIDd:         5.04 cm  Diastology LVIDs:         3.65 cm  LV e' lateral:   6.20 cm/s LV PW:         1.18 cm  LV E/e' lateral: 10.0 LV IVS:        1.21 cm  LV e' medial:    4.24 cm/s LVOT diam:     2.20 cm  LV E/e' medial:  14.6 LV SV:         89 LV SV  Index:   46 LVOT Area:     3.80 cm  RIGHT VENTRICLE RV S prime:     11.50 cm/s TAPSE (M-mode): 1.3 cm LEFT ATRIUM              Index       RIGHT ATRIUM           Index LA diam:  4.60 cm  2.38 cm/m  RA Area:     27.80 cm LA Vol (A2C):   128.0 ml 66.21 ml/m RA Volume:   93.60 ml  48.41 ml/m LA Vol (A4C):   68.6 ml  35.48 ml/m LA Biplane Vol: 97.5 ml  50.43 ml/m  AORTIC VALVE AV Area (Vmax):    1.27 cm AV Area (Vmean):   1.40 cm AV Area (VTI):     1.40 cm AV Vmax:           286.00 cm/s AV Vmean:          194.000 cm/s AV VTI:            0.634 m AV Peak Grad:      32.7 mmHg AV Mean Grad:      17.0 mmHg LVOT Vmax:         95.80 cm/s LVOT Vmean:        71.500 cm/s LVOT VTI:          0.233 m LVOT/AV VTI ratio: 0.37  AORTA Ao Root diam: 3.50 cm MITRAL VALVE               TRICUSPID VALVE MV Area (PHT): 1.48 cm    TR Peak grad:   18.3 mmHg MV Decel Time: 511 msec    TR Vmax:        214.00 cm/s MV E velocity: 61.70 cm/s MV A velocity: 57.00 cm/s  SHUNTS MV E/A ratio:  1.08        Systemic VTI:  0.23 m                            Systemic Diam: 2.20 cm Eleonore Chiquito MD Electronically signed by Eleonore Chiquito MD Signature Date/Time: 03/06/2020/12:23:13 PM    Final    VAS US CAROTID  Result Date: 02/22/2020 Carotid Arterial Duplex Study Indications: Carotid artery disease. Performing Technologist: Alvia Grove RVT  Examination Guidelines: A complete evaluation includes B-mode imaging, spectral Doppler, color Doppler, and power Doppler as needed of all accessible portions of each vessel. Bilateral testing is considered an integral part of a complete examination. Limited examinations for reoccurring indications may be performed as noted.  Right Carotid Findings: +----------+--------+--------+--------+-------------------------+--------+           PSV cm/sEDV cm/sStenosisPlaque Description       Comments +----------+--------+--------+--------+-------------------------+--------+ CCA Prox  119     18               heterogenous                      +----------+--------+--------+--------+-------------------------+--------+ CCA Mid   114     19              heterogenous and calcific         +----------+--------+--------+--------+-------------------------+--------+ CCA Distal103     22                                                +----------+--------+--------+--------+-------------------------+--------+ ICA Prox  74      9               heterogenous                      +----------+--------+--------+--------+-------------------------+--------+ ICA Mid   64  22                                                +----------+--------+--------+--------+-------------------------+--------+ ICA Distal69      18                                                +----------+--------+--------+--------+-------------------------+--------+ ECA       101     12                                                +----------+--------+--------+--------+-------------------------+--------+ +----------+--------+-------+----------------+-------------------+           PSV cm/sEDV cmsDescribe        Arm Pressure (mmHG) +----------+--------+-------+----------------+-------------------+ Subclavian101     3      Multiphasic, WNL                    +----------+--------+-------+----------------+-------------------+ +---------+--------+--+--------+-+---------+ VertebralPSV cm/s45EDV cm/s9Antegrade +---------+--------+--+--------+-+---------+  Left Carotid Findings: +----------+--------+--------+--------+------------------+--------+           PSV cm/sEDV cm/sStenosisPlaque DescriptionComments +----------+--------+--------+--------+------------------+--------+ CCA Prox  110     14              homogeneous                +----------+--------+--------+--------+------------------+--------+ CCA Mid   96      20              heterogenous                +----------+--------+--------+--------+------------------+--------+ CCA Distal97      21              heterogenous               +----------+--------+--------+--------+------------------+--------+ ICA Prox  262     58      40-59%  calcific                   +----------+--------+--------+--------+------------------+--------+ ICA Mid   86      18                                         +----------+--------+--------+--------+------------------+--------+ ICA Distal58      18                                         +----------+--------+--------+--------+------------------+--------+ ECA       265     45      >50%    heterogenous               +----------+--------+--------+--------+------------------+--------+ +----------+--------+--------+----------------+-------------------+           PSV cm/sEDV cm/sDescribe        Arm Pressure (mmHG) +----------+--------+--------+----------------+-------------------+ Subclavian122     3       Multiphasic, WNL                    +----------+--------+--------+----------------+-------------------+ +---------+--------+--+--------+--+---------+  VertebralPSV cm/s56EDV cm/s11Antegrade +---------+--------+--+--------+--+---------+   Summary: Right Carotid: Velocities in the right ICA are consistent with a 1-39% stenosis. Left Carotid: Velocities in the left ICA are consistent with a 40-59% stenosis. Vertebrals:  Bilateral vertebral arteries demonstrate antegrade flow. Subclavians: Normal flow hemodynamics were seen in bilateral subclavian              arteries. *See table(s) above for measurements and observations.  Electronically signed by Ruta Hinds MD on 02/22/2020 at 11:25:42 AM.    Final    CT ANGIO HEAD CODE STROKE  Result Date: 03/06/2020 CLINICAL DATA:  Initial evaluation for neuro deficit, stroke suspected. EXAM: CT ANGIOGRAPHY HEAD AND NECK TECHNIQUE: Multidetector CT imaging of the head and neck was performed using the  standard protocol during bolus administration of intravenous contrast. Multiplanar CT image reconstructions and MIPs were obtained to evaluate the vascular anatomy. Carotid stenosis measurements (when applicable) are obtained utilizing NASCET criteria, using the distal internal carotid diameter as the denominator. CONTRAST:  69mL OMNIPAQUE IOHEXOL 350 MG/ML SOLN COMPARISON:  Prior head CT from earlier the same day. FINDINGS: CTA NECK FINDINGS Aortic arch: Visualized aortic arch normal in caliber with normal branch pattern. Mild atheromatous change about the arch itself. No visible hemodynamically significant stenosis about the origin of the great vessels. Right carotid system: Scattered eccentric calcified plaque within the right CCA without flow-limiting stenosis. Additional eccentric calcified plaque at the origin of the right ICA without significant stenosis. Right ICA patent distally to the skull base without stenosis, dissection or occlusion. Left carotid system: Left CCA widely patent proximally. Concentric mixed plaque about the left carotid bifurcation with associated stenosis of up to 75% by NASCET criteria (series 8, image 233). Left ICA patent distally to the skull base without stenosis, dissection or occlusion. Vertebral arteries: Both vertebral arteries arise from the subclavian arteries. Eccentric calcified plaque at the origin of the right subclavian artery with approximate 50% stenosis (series 8, image 78). Moderate approximate 50% stenosis at the origin of the left vertebral artery (series 8, image 114). Vertebral arteries otherwise widely patent within the neck without stenosis, dissection or occlusion. Skeleton: No acute osseous abnormality. Sclerotic lesion within the C3 spinous process most consistent with a small benign bone island. No other discrete or worrisome osseous lesions. Mild multilevel cervical spondylosis without high-grade stenosis. Other neck: No other acute soft tissue abnormality  within the neck. No mass lesion or adenopathy. Upper chest: Visualized upper chest demonstrates no acute finding. Review of the MIP images confirms the above findings CTA HEAD FINDINGS Anterior circulation: Petrous segments widely patent bilaterally. Scattered atheromatous plaque throughout the carotid siphons with associated mild multifocal narrowing. ICA termini well perfused. A1 segments widely patent. Normal anterior communicating artery complex. Anterior cerebral arteries widely patent to their distal aspects without stenosis. No M1 stenosis or occlusion. Normal MCA bifurcations. Distal MCA branches well perfused and symmetric. Posterior circulation: Vertebral arteries patent to the vertebrobasilar junction without stenosis. Mild non stenotic plaque noted within the proximal left V4 segment. Left PICA patent. Right PICA not seen. Basilar patent to its distal aspect without stenosis. Superior cerebral arteries patent bilaterally. Both PCAs primarily supplied via the basilar perfused to their distal aspects without stenosis. Venous sinuses: Patent allowing for timing the contrast bolus. Anatomic variants: None significant.  No intracranial aneurysm. Review of the MIP images confirms the above findings IMPRESSION: 1. Negative CTA for emergent large vessel occlusion. 2. 75% atheromatous stenosis about the left carotid bifurcation. 3. 50% stenosis at the origin  of the right subclavian artery. 4. Moderate approximate 50% stenosis at the origin of the left vertebral artery. 5. Additional mild scattered atherosclerotic change elsewhere about the major arterial vasculature of the head and neck as above. No other hemodynamically significant or correctable stenosis. Electronically Signed   By: Jeannine Boga M.D.   On: 03/06/2020 00:46   CT ANGIO NECK CODE STROKE  Result Date: 03/06/2020 CLINICAL DATA:  Initial evaluation for neuro deficit, stroke suspected. EXAM: CT ANGIOGRAPHY HEAD AND NECK TECHNIQUE:  Multidetector CT imaging of the head and neck was performed using the standard protocol during bolus administration of intravenous contrast. Multiplanar CT image reconstructions and MIPs were obtained to evaluate the vascular anatomy. Carotid stenosis measurements (when applicable) are obtained utilizing NASCET criteria, using the distal internal carotid diameter as the denominator. CONTRAST:  86mL OMNIPAQUE IOHEXOL 350 MG/ML SOLN COMPARISON:  Prior head CT from earlier the same day. FINDINGS: CTA NECK FINDINGS Aortic arch: Visualized aortic arch normal in caliber with normal branch pattern. Mild atheromatous change about the arch itself. No visible hemodynamically significant stenosis about the origin of the great vessels. Right carotid system: Scattered eccentric calcified plaque within the right CCA without flow-limiting stenosis. Additional eccentric calcified plaque at the origin of the right ICA without significant stenosis. Right ICA patent distally to the skull base without stenosis, dissection or occlusion. Left carotid system: Left CCA widely patent proximally. Concentric mixed plaque about the left carotid bifurcation with associated stenosis of up to 75% by NASCET criteria (series 8, image 233). Left ICA patent distally to the skull base without stenosis, dissection or occlusion. Vertebral arteries: Both vertebral arteries arise from the subclavian arteries. Eccentric calcified plaque at the origin of the right subclavian artery with approximate 50% stenosis (series 8, image 78). Moderate approximate 50% stenosis at the origin of the left vertebral artery (series 8, image 114). Vertebral arteries otherwise widely patent within the neck without stenosis, dissection or occlusion. Skeleton: No acute osseous abnormality. Sclerotic lesion within the C3 spinous process most consistent with a small benign bone island. No other discrete or worrisome osseous lesions. Mild multilevel cervical spondylosis without  high-grade stenosis. Other neck: No other acute soft tissue abnormality within the neck. No mass lesion or adenopathy. Upper chest: Visualized upper chest demonstrates no acute finding. Review of the MIP images confirms the above findings CTA HEAD FINDINGS Anterior circulation: Petrous segments widely patent bilaterally. Scattered atheromatous plaque throughout the carotid siphons with associated mild multifocal narrowing. ICA termini well perfused. A1 segments widely patent. Normal anterior communicating artery complex. Anterior cerebral arteries widely patent to their distal aspects without stenosis. No M1 stenosis or occlusion. Normal MCA bifurcations. Distal MCA branches well perfused and symmetric. Posterior circulation: Vertebral arteries patent to the vertebrobasilar junction without stenosis. Mild non stenotic plaque noted within the proximal left V4 segment. Left PICA patent. Right PICA not seen. Basilar patent to its distal aspect without stenosis. Superior cerebral arteries patent bilaterally. Both PCAs primarily supplied via the basilar perfused to their distal aspects without stenosis. Venous sinuses: Patent allowing for timing the contrast bolus. Anatomic variants: None significant.  No intracranial aneurysm. Review of the MIP images confirms the above findings IMPRESSION: 1. Negative CTA for emergent large vessel occlusion. 2. 75% atheromatous stenosis about the left carotid bifurcation. 3. 50% stenosis at the origin of the right subclavian artery. 4. Moderate approximate 50% stenosis at the origin of the left vertebral artery. 5. Additional mild scattered atherosclerotic change elsewhere about the major arterial vasculature  of the head and neck as above. No other hemodynamically significant or correctable stenosis. Electronically Signed   By: Jeannine Boga M.D.   On: 03/06/2020 00:46    PHYSICAL EXAM  Temp:  [97.8 F (36.6 C)-98.3 F (36.8 C)] 98.3 F (36.8 C) (08/26 1135) Pulse Rate:   [52-66] 58 (08/26 1135) Resp:  [16-18] 16 (08/26 1135) BP: (119-156)/(56-90) 119/90 (08/26 1135) SpO2:  [96 %-100 %] 96 % (08/26 1135)  General - Well nourished, well developed, in no apparent distress.  Ophthalmologic - fundi not visualized due to noncooperation.  Cardiovascular - Regular rate and rhythm.  Mental Status -  Level of arousal and orientation to time, place, and person were intact. Language including expression, naming, repetition, comprehension was assessed and found intact. Fund of Knowledge was assessed and was intact.  Cranial Nerves II - XII - II - Visual field intact OU. III, IV, VI - Extraocular movements intact. V - Facial sensation intact bilaterally. VII - right nasolabial fold flattening. VIII - Hearing & vestibular intact bilaterally. X - Palate elevates symmetrically. XI - Chin turning & shoulder shrug intact bilaterally. XII - Tongue protrusion intact.  Motor Strength - The patient's strength was normal in all extremities and pronator drift was absent.  Bulk was normal and fasciculations were absent.   Motor Tone - Muscle tone was assessed at the neck and appendages and was normal.  Reflexes - The patient's reflexes were symmetrical in all extremities and he had no pathological reflexes.  Sensory - Light touch, temperature/pinprick were assessed and were symmetrical.    Coordination - The patient had normal movements in the hands and feet with no ataxia or dysmetria.  Tremor was absent.  Gait and Station - deferred.    Assessment:  Joshua Hess is a 79 y.o. male with history of carotid artery stenosis (per son 50-70% via u/s) bradycardia, s/p AVR 10/2018, hard of hearing presenting to [facility] with R facial droop. He did not receive IV t-PA due to presenting outside the window.   L brain TIA vs. Small stroke no seen on MRI  Still has right facial droop with improvement per partner  CT head No acute stroke.   MRI   No acute stroke. Small vessel disease. Atrophy.   CTA head & neck no LVO. L ICA bifurcation 75% stenosis. R subclavian artery 50% stenosis. L VA origin 50% stenosis. Mild scattered atherosclerosis   Carotid Doppler 02/22/2020 L 40-59% stenosis  2D Echo EF 50-55%. No source of embolus. LA mildly dilated.  Prosthetic AV  LDL 52  HgbA1c 5.2   Lovenox 40 mg sq daily for VTE prophylaxis  aspirin 81 mg daily prior to admission, now on aspirin 81 mg daily and clopidogrel 75 mg daily. Continue DAPT for 3 weeks and then plavix alone.   Therapy recommendations:  OP SLP (language)  Disposition:  Return home  Carotid Stenosis  CTA neck 75% L ICA bifurcation stenosis   Carotid doppler 02/22/2020 40-59% stenosis  [facility] consulted - Dr. Donnetta Hutching will do left CEA tomorrow  Continue DAPT and statin   Hx of TIA   07/2019, acute word finding difficulty, had ER visit, CT negative.  Follow-up as outpatient MRI 08/2019 - for acute infarct.  11/2019 had a ER visit for encephalopathy, MRI and CT negative.  Follows with Dr. Krista Blue at [facility]  Hypertension  Stable  Long-term BP goal normotensive  Hyperlipidemia  Home meds:  crestor 20, resumed in hospital  LDL  74, goal < 70  Continue statin at discharge  Other Stroke Risk Factors  Advanced age  Former Cigarette smoker, quit 86 yrs ago  Mild Coronary artery disease  Severe AS s/p tissue AVR  Other Active Problems  Chronic bradycardia, stable (avid marathoner) - cardiology on board, no need to treat  BPH on finasteride   Depression/anxiety on sertraline  [facility] day # 1  Neurology will sign off. Please call with questions. Pt will follow up with Dr. Krista Blue at [facility] on 03/14/20. Thanks for the consult.   Rosalin Hawking, MD PhD Stroke Neurology 03/07/2020 12:46 PM    To contact Stroke Continuity provider, please refer to [email or web address] After hours, contact General Neurology

## 2020-03-07 NOTE — Progress Notes (Signed)
Pt for left CEA tomorrow by Dr. Donnetta Hutching.  Pt has been pre-op'd with consent/labs/npo after MN ordered.  Lovenox has been discontinued for surgery tomorrow given next dose was scheduled tonight at 2100.   Leontine Locket, [facility] 03/07/2020 11:27 AM

## 2020-03-07 NOTE — Progress Notes (Signed)
PROGRESS NOTE    NOLAND PIZANO  Joshua Hess:[id number] DOB: December 17, 1940 DOA: 03/05/2020 PCP: [name], MD   Brief Narrative:  HPI per Dr. Zada Finders HUNNER GARCON is a 79 y.o. male with medical history significant for severe AS s/p tissue AVR April 2020, mild nonobstructive CAD, sinus bradycardia, carotid artery stenosis (8/12 carotid Dopplers show left ICA 40-59%, right ICA 1-39%), BPH, and hard of hearing who presented to [facility] for evaluation of new onset right facial droop.  History is supplemented by patient's partner at bedside.  Patient was in his usual state of health with LPN approximately 9 PM 03/04/2020.  This morning he woke up and ran 3 miles per his usual daily activities.  When he returned home his partner noticed significant right facial droop.  Patient had associated lightheadedness but denied any other symptoms including dysarthria, nausea, vomiting, numbness/tingling or change in sensation, focal weakness, change in vision, gait instability, chest pain, palpitations, dyspnea, abdominal pain, diarrhea or constipation, or dysuria.  He was taken to [place] [facility] ED for further evaluation.  Patient also reports several months of lightheadedness occurring shortly after positional changes.  He has been having feelings of anxiousness and his primary care physician has adjusted his medications with recent initiation of sertraline and as needed diazepam.  Patient has known carotid artery stenosis with carotid Dopplers 8/12 showing left ICA 40-59% stenosis in right ICA 1-39% stenosis.  He has been taking aspirin 81 and mg rosuvastatin 10 mg daily.  He is currently in the process of staged dental implants through [facility].  Context.  He says he does take 2000 mg amoxicillin prior to dental procedures.  [facility] Course:  Initial vitals showed BP 120/66, pulse 56, RR 14, temp 98.0 Fahrenheit, SPO2 98% on room air.  Labs showed WBC 6.6, hemoglobin 14.0, platelets  158,000, sodium 139, potassium 4.3, bicarb 27, BUN 20, creatinine 1.01, serum glucose 108, LFTs within normal limits.  Serum ethanol level <10.  UDS positive for benzodiazepines.  Urinalysis negative for UTI.  SARS-CoV-2 PCR is negative.  CT head without contrast is negative for acute intracranial abnormality.  Teleneurology were consulted and recommended initiating Plavix 75 mg daily with ASA 81 mg DAPT x21 days.  Medical admission for further stroke work-up with MRI head, MRA head/neck, TTE was recommended.  Patient was given 300 mg Plavix load and aspirin 324 mg.  The hospitalist service was consulted for further evaluation and management.  Review of Systems: All systems reviewed and are negative except as documented in history of present illness above.  **Interim History Cardiology, neurology and vascular surgery involved in the patient's care.  Cardiology felt the patient was acceptable risk for preoperative clearance and vascular surgery discussed the options of carotid endarterectomy versus a TCAR and patient is to discuss this further before making a decision.  Neurology recommending dual antiplatelet therapy.  Patient has decided to elect for the Left carotid endarterectomy and is scheduled for tomorrow for reduction of stroke risk.  He is going to be n.p.o. after midnight and vascular surgery states that this will be around noon or so and postoperatively only he will be transferred to 4 E.  His Lovenox has now been discontinued by the vascular surgery PA.  Assessment & Plan:   Principal Problem:   Facial droop Active Problems:   Benign prostatic hyperplasia with nocturia   Sinus bradycardia   Carotid artery stenosis   S/P AVR (aortic valve replacement)  Mild CAD  Acute Right Facial Droop -Admitted for stroke evaluation.   -CT head is negative.   -Teleneurology was consulted and patient has been loaded with Plavix 300 mg and aspirin 324 mg.  -He takes aspirin 81 mg and  rosuvastatin 10 mg daily as an outpatient.  Discussed with on-call neurology, Dr. Rory Percy who recommends obtaining CTA head and neck instead of MRI at this time. -MRI brain showed "No acute intracranial abnormality. Generalized age-related cerebral atrophy with mild chronic small vessel ischemic disease." -CTA head and neck showed "Negative CTA for emergent large vessel occlusion. 75% atheromatous stenosis about the left carotid bifurcation. 50% stenosis at the origin of the right subclavian artery. Moderate approximate 50% stenosis at the origin of the left vertebral artery. Additional mild scattered atherosclerotic change elsewhere about the major arterial vasculature of the head and neck as above. No other hemodynamically significant or correctable stenosis." -Echocardiogram done and showed EF of 50-55% with mild concentric LVH and Paradoxical Spetal motion and Grade 1 DD  -Monitor on telemetry, continue neurochecks  -Neurology was formally consulted and felt that he could have a had a TIA versus a small stroke not seen on MRI -Continue with DAPT aspirin 81 mg daily Plavix 75 mg daily beginning tomorrow -Increased home rosuvastatin to 20 mg daily -Allow permissive hypertension up to 220/110 -Check A1c and was 5.2, lipid panel done and showed a total cholesterol/HDL ratio 2.3, cholesterol level 109, HDL 47, LDL 52, triglycerides of 49, VLDL 10 -PT/OT/SLP eval; PT and OT recommend no F/U and SLP recommending outpatient SLP  -Neurology to follow, appreciate assistance and they recommended obtaining a vascular surgery consultation as they feel the patient had symptomatic left ICA stenosis and wanted help with vascular surgery opinion for a CEA -Cardiology was consulted for preoperative clearance in case she does need a CEA and he has an acceptable risk -Vascular surgery evaluating and recommending either CEA for reduction of stroke risk versus TCAR and patient is to discuss this further prior to making  decision.  If he does go for surgery it can be done as inpatient or he can be discharged with a brief interval and readmitted for endarterectomy but the timing will be left to the vascular surgery team -Patient has elected for the left carotid endarterectomy and this will be done tomorrow tentatively around noon; he will be n.p.o. after midnight and his Lovenox has been discontinued for now -Patient has had a history of a TIA in the past and he follows with Dr. Evelena Leyden at [facility]  Orthostatic hypotension -Check orthostatic vitals; repeat was not done but he was orthostatic on admission on 03/06/2020 when his blood pressure lying went from 155/75 to standing 108/76 -Hold home finasteride for now -He is getting IV fluid hydration medicine now stopped now.  Carotid artery stenosis: -Follows with vascular surgery, Dr. Oneida Alar. 8/12 carotid Dopplers show left ICA stenosis 40-59%, right ICA 1-39%.   -Follow-up MRI studies as above.   -Continue dual antiplatelet therapy with aspirin and Plavix; rosuvastatin as above. -CTA of the Head and Neck showed "Negative CTA for emergent large vessel occlusion. 75% atheromatous stenosis about the left carotid bifurcation. 50% stenosis at the origin of the right subclavian artery. Moderate approximate 50% stenosis at the origin of the left vertebral artery. Additional mild scattered atherosclerotic change elsewhere about the major arterial vasculature of the head and neck as above. No other hemodynamically significant or correctable stenosis." -Patient to undergo a left carotid endarterectomy tomorrow  Sinus Bradycardia -Chronic and at patient's baseline.  He is an avid Production designer, theatre/television/film.  Mild CAD Severe AS s/p tissue AVR: -Chronic and stable.  Follows with cardiology, Dr. Angelena Form cardiothoracic surgery Dr. Cyndia Bent. -Continue Aspirin, Plavix, rosuvastatin as above  BPH: -Was Holding home finasteride for now as above.  Depression/Anxiety:  -Continue sertraline. -Resumed Home Valium and Trazodone   DVT prophylaxis: Enoxaparin 40 mg sq q24h Code Status: DO NOT RESUSCITATE  Family Communication: Spoke to Husband at bedside  Disposition Plan: Pending further neurological clearance as well as vascular clearance and will need to check orthostatic vital signs prior to discharge; patient to undergo a left carotid endarterectomy in the morning  Status is: Inpatient  Remains inpatient appropriate because:Ongoing diagnostic testing needed not appropriate for outpatient work up, Unsafe d/c plan and Inpatient level of care appropriate due to severity of illness   Dispo: The patient is from: Home              Anticipated d/c is to: Home              Anticipated d/c date is: 2 days              Patient currently is not medically stable to d/c.   Consultants:   Neurology  Vascular Surgery  Cardiology    Procedures:  ECHOCARDIOGRAM IMPRESSIONS    1. Left ventricular ejection fraction, by estimation, is 50 to 55%. The  left ventricle has low normal function. The left ventricle has no regional  wall motion abnormalities. There is mild concentric left ventricular  hypertrophy. Left ventricular  diastolic parameters are consistent with Grade I diastolic dysfunction  (impaired relaxation).  2. Right ventricular systolic function is low normal. The right  ventricular size is mildly enlarged. There is normal pulmonary artery  systolic pressure. The estimated right ventricular systolic pressure is  18.5 mmHg.  3. Left atrial size was mild to moderately dilated.  4. Right atrial size was mildly dilated.  5. The mitral valve is grossly normal. Mild mitral valve regurgitation.  No evidence of mitral stenosis.  6. 23 mm surgical prosthetic valve present with V max 2.8 m/s, MG 17  mmHG, EOA 1.40 cm2. The aortic valve has been repaired/replaced. Aortic  valve regurgitation is not visualized. No aortic stenosis is present.   There is a 23 mm bioprosthetic valve  present in the aortic position. Procedure Date: 11/10/2018. Echo findings  are consistent with normal structure and function of the aortic valve  prosthesis.  7. The inferior vena cava is normal in size with greater than 50%  respiratory variability, suggesting right atrial pressure of 3 mmHg.   Comparison(s): No significant change from prior study.   Conclusion(s)/Recommendation(s): No intracardiac source of embolism  detected on this transthoracic study. A transesophageal echocardiogram is  recommended to exclude cardiac source of embolism if clinically indicated.   FINDINGS  Left Ventricle: Left ventricular ejection fraction, by estimation, is 50  to 55%. The left ventricle has low normal function. The left ventricle has  no regional wall motion abnormalities. The left ventricular internal  cavity size was normal in size.  There is mild concentric left ventricular hypertrophy. Abnormal  (paradoxical) septal motion consistent with post-operative status. Left  ventricular diastolic parameters are consistent with Grade I diastolic  dysfunction (impaired relaxation).   Right Ventricle: The right ventricular size is mildly enlarged. No  increase in right ventricular wall thickness. Right ventricular systolic  function is low normal. There is normal pulmonary artery  systolic  pressure. The tricuspid regurgitant velocity is  2.14 m/s, and with an assumed right atrial pressure of 3 mmHg, the  estimated right ventricular systolic pressure is 72.6 mmHg.   Left Atrium: Left atrial size was mild to moderately dilated.   Right Atrium: Right atrial size was mildly dilated.   Pericardium: There is no evidence of pericardial effusion.   Mitral Valve: The mitral valve is grossly normal. Mild mitral valve  regurgitation. No evidence of mitral valve stenosis.   Tricuspid Valve: The tricuspid valve is grossly normal. Tricuspid valve  regurgitation is  mild . No evidence of tricuspid stenosis.   Aortic Valve: 23 mm surgical prosthetic valve present with V max 2.8 m/s,  MG 17 mmHG, EOA 1.40 cm2. The aortic valve has been repaired/replaced.  Aortic valve regurgitation is not visualized. No aortic stenosis is  present. Aortic valve mean gradient  measures 17.0 mmHg. Aortic valve peak gradient measures 32.7 mmHg. Aortic  valve area, by VTI measures 1.40 cm. There is a 23 mm bioprosthetic valve  present in the aortic position. Procedure Date: 11/10/2018. Echo findings  are consistent with normal  structure and function of the aortic valve prosthesis.   Pulmonic Valve: The pulmonic valve was grossly normal. Pulmonic valve  regurgitation is not visualized. No evidence of pulmonic stenosis.   Aorta: The aortic root is normal in size and structure.   Venous: The inferior vena cava is normal in size with greater than 50%  respiratory variability, suggesting right atrial pressure of 3 mmHg.   IAS/Shunts: The atrial septum is grossly normal.   EKG: Rhythm strip during this exam demostrated sinus bradycardia and  premature ventricular contractions.     LEFT VENTRICLE  PLAX 2D  LVIDd:     5.04 cm Diastology  LVIDs:     3.65 cm LV e' lateral:  6.20 cm/s  LV PW:     1.18 cm LV E/e' lateral: 10.0  LV IVS:    1.21 cm LV e' medial:  4.24 cm/s  LVOT diam:   2.20 cm LV E/e' medial: 14.6  LV SV:     89  LV SV Index:  46  LVOT Area:   3.80 cm     RIGHT VENTRICLE  RV S prime:   11.50 cm/s  TAPSE (M-mode): 1.3 cm   LEFT ATRIUM       Index    RIGHT ATRIUM      Index  LA diam:    4.60 cm 2.38 cm/m RA Area:   27.80 cm  LA Vol (A2C):  128.0 ml 66.21 ml/m RA Volume:  93.60 ml 48.41 ml/m  LA Vol (A4C):  68.6 ml 35.48 ml/m  LA Biplane Vol: 97.5 ml 50.43 ml/m  AORTIC VALVE  AV Area (Vmax):  1.27 cm  AV Area (Vmean):  1.40 cm  AV Area (VTI):   1.40 cm  AV Vmax:       286.00 cm/s  AV Vmean:     194.000 cm/s  AV VTI:      0.634 m  AV Peak Grad:   32.7 mmHg  AV Mean Grad:   17.0 mmHg  LVOT Vmax:     95.80 cm/s  LVOT Vmean:    71.500 cm/s  LVOT VTI:     0.233 m  LVOT/AV VTI ratio: 0.37    AORTA  Ao Root diam: 3.50 cm   MITRAL VALVE        TRICUSPID VALVE  MV Area (PHT): 1.48 cm  TR Peak grad:  18.3 mmHg  MV Decel Time: 511 msec  TR Vmax:    214.00 cm/s  MV E velocity: 61.70 cm/s  MV A velocity: 57.00 cm/s SHUNTS  MV E/A ratio: 1.08    Systemic VTI: 0.23 m               Systemic Diam: 2.20 cm   Antimicrobials:  Anti-infectives (From admission, onward)   Start     Dose/Rate Route Frequency Ordered Stop   03/08/20 0600  ceFAZolin (ANCEF) IVPB 2g/100 mL premix       Note to Pharmacy: Send with pt to OR   2 g 200 mL/hr over 30 Minutes Intravenous On call 03/07/20 1125 03/09/20 0600     Subjective: Seen and examined at bedside and he and his husband were at bedside talking to Dr. Donnetta Hutching when I walked in the room and they have elected for a left carotid endarterectomy.  The patient was anxious but denied any chest pain, lightheadedness or dizziness.  States he did not sleep very well last night.  No nausea or vomiting.  No other concerns or complaints at this time.  Objective: Vitals:   03/06/20 2343 03/07/20 0409 03/07/20 0733 03/07/20 1135  BP: (!) 156/73 138/78 133/70 119/90  Pulse: 61 (!) 56 (!) 52 (!) 58  Resp: 18 18 18 16   Temp: 98.2 F (36.8 C) 97.8 F (36.6 C) 98 F (36.7 C) 98.3 F (36.8 C)  TempSrc: Oral Oral Oral Oral  SpO2: 100% 98% 97% 96%  Weight:      Height:        Intake/Output Summary (Last 24 hours) at 03/07/2020 1205 Last data filed at 03/06/2020 1500 Gross per 24 hour  Intake 923.3 ml  Output -  Net 923.3 ml   Filed Weights   03/05/20 1144  Weight: 77.6 kg   Examination: Physical Exam:  Constitutional: WN/WD mildly overweight Caucasian  male who appears anxious again but is sitting up in the bed and appears comfortable Eyes: Lids and conjunctivae normal, sclerae anicteric  ENMT: External Ears, Nose appear normal. Grossly normal hearing Neck: Appears normal, supple, no cervical masses, normal ROM, no appreciable thyromegaly; no JVD Respiratory: Diminished to auscultation bilaterally, no wheezing, rales, rhonchi or crackles. Normal respiratory effort and patient is not tachypenic. No accessory muscle use.  Cardiovascular: Bradycardic rate but is sinus, has a 3/6 systolic murmur.. S1 and S2 auscultated. No extremity edema.  Abdomen: Soft, non-tender, very mildly distended due to his body habitus. Bowel sounds positive.  GU: Deferred. Musculoskeletal: No clubbing / cyanosis of digits/nails. No joint deformity upper and lower extremities.  Skin: No rashes, lesions, ulcers on limited skin evaluation. No induration; Warm and dry.  Neurologic: CN 2-12 grossly intact with no focal deficits. Romberg sign cerebellar reflexes not assessed.  Psychiatric: Normal judgment and insight. Alert and oriented x 3.  Appears anxious mood and appropriate affect.   Data Reviewed: I have personally reviewed following labs and imaging studies  CBC: Recent Labs  Lab 03/05/20 1209 03/06/20 0158 03/07/20 0346  WBC 6.6 6.7 7.0  NEUTROABS 3.8  --  4.3  HGB 14.0 14.5 14.4  HCT 41.9 42.9 40.7  MCV 97.9 96.6 95.5  PLT 158 162 188   Basic Metabolic Panel: Recent Labs  Lab 03/05/20 1209 03/06/20 0158 03/07/20 0346  NA 139 139 136  K 4.3 4.0 4.5  CL 103 104 101  CO2 27 28 27   GLUCOSE 108* 76 93  BUN 20  18 19  CREATININE 1.01 0.86 0.97  CALCIUM 8.7* 8.7* 9.0  MG  --   --  1.9  PHOS  --   --  3.8   GFR: Estimated Creatinine Clearance: 61.8 mL/min (by C-G formula based on SCr of 0.97 mg/dL). Liver Function Tests: Recent Labs  Lab 03/05/20 1209 03/07/20 0346  AST 22 21  ALT 21 19  ALKPHOS 45 48  BILITOT 0.6 1.1  PROT 6.5 6.2*   ALBUMIN 3.7 3.5   No results for input(s): LIPASE, AMYLASE in the last 168 hours. No results for input(s): AMMONIA in the last 168 hours. Coagulation Profile: Recent Labs  Lab 03/05/20 1209  INR 1.1   Cardiac Enzymes: No results for input(s): CKTOTAL, CKMB, CKMBINDEX, TROPONINI in the last 168 hours. BNP (last 3 results) No results for input(s): PROBNP in the last 8760 hours. HbA1C: Recent Labs    03/06/20 0158  HGBA1C 5.2   CBG: No results for input(s): GLUCAP in the last 168 hours. Lipid Profile: Recent Labs    03/06/20 0158  CHOL 109  HDL 47  LDLCALC 52  TRIG 49  CHOLHDL 2.3   Thyroid Function Tests: Recent Labs    03/06/20 0158  TSH 3.435   Anemia Panel: No results for input(s): VITAMINB12, FOLATE, FERRITIN, TIBC, IRON, RETICCTPCT in the last 72 hours. Sepsis Labs: No results for input(s): PROCALCITON, LATICACIDVEN in the last 168 hours.  Recent Results (from the past 240 hour(s))  SARS Coronavirus 2 by RT PCR (hospital order, performed in [facility] hospital lab) Nasopharyngeal Nasopharyngeal Swab     Status: None   Collection Time: 03/05/20  2:50 PM   Specimen: Nasopharyngeal Swab  Result Value Ref Range Status   SARS Coronavirus 2 NEGATIVE NEGATIVE Final    Comment: (NOTE) SARS-CoV-2 target nucleic acids are NOT DETECTED.  The SARS-CoV-2 RNA is generally detectable in upper and lower respiratory specimens during the acute phase of infection. The lowest concentration of SARS-CoV-2 viral copies this assay can detect is 250 copies / mL. A negative result does not preclude SARS-CoV-2 infection and should not be used as the sole basis for treatment or other patient management decisions.  A negative result may occur with improper specimen collection / handling, submission of specimen other than nasopharyngeal swab, presence of viral mutation(s) within the areas targeted by this assay, and inadequate number of viral copies (<250 copies / mL). A negative  result must be combined with clinical observations, patient history, and epidemiological information.  Fact Sheet for Patients:   StrictlyIdeas.no  Fact Sheet for Healthcare Providers: BankingDealers.co.za  This test is not yet approved or  cleared by the [place] FDA and has been authorized for detection and/or diagnosis of SARS-CoV-2 by FDA under an Emergency Use Authorization (EUA).  This EUA will remain in effect (meaning this test can be used) for the duration of the COVID-19 declaration under Section 564(b)(1) of the Act, 21 U.S.C. section 360bbb-3(b)(1), unless the authorization is terminated or revoked sooner.  Performed at [facility], [address] [place], [place] [address]      RN Pressure Injury Documentation:     Estimated body mass index is 25.26 kg/m as calculated from the following:   Height as of this encounter: 5\' 9"  (1.753 m).   Weight as of this encounter: 77.6 kg.  Malnutrition Type:      Malnutrition Characteristics:      Nutrition Interventions:    Radiology Studies: CT HEAD WO CONTRAST  Result Date: 03/05/2020 CLINICAL DATA:  Acute neuro deficit. Facial droop today. Dizziness. EXAM: CT HEAD WITHOUT CONTRAST TECHNIQUE: Contiguous axial images were obtained from the base of the skull through the vertex without intravenous contrast. COMPARISON:  CT head 11/18/2019 FINDINGS: Brain: Ventricle size and cerebral volume normal for age. Negative for acute infarct, hemorrhage, mass Vascular: Negative for hyperdense vessel Skull: Negative Sinuses/Orbits: Paranasal sinuses clear.  No orbital lesion. Other: None IMPRESSION: No acute intracranial abnormality.  Negative for age. Electronically Signed   By: Franchot Gallo M.D.   On: 03/05/2020 12:53   MR BRAIN WO CONTRAST  Result Date: 03/06/2020 CLINICAL DATA:  Initial evaluation for neuro deficit, stroke suspected. EXAM: MRI HEAD WITHOUT  CONTRAST TECHNIQUE: Multiplanar, multiecho pulse sequences of the brain and surrounding structures were obtained without intravenous contrast. COMPARISON:  Prior head CT from earlier the same day. FINDINGS: Brain: Generalized age-related cerebral atrophy. Scattered patchy T2/FLAIR hyperintensity within the periventricular and deep white matter both cerebral hemispheres most consistent with chronic small vessel ischemic disease, mild for age. No definite abnormal foci of restricted diffusion to suggest acute or subacute ischemia. Please note evaluation is somewhat limited due to motion artifact and poor resolution on axial DWI sequence. Gray-white matter differentiation maintained. No encephalomalacia to suggest chronic cortical infarction. No acute intracranial hemorrhage. Single punctate chronic microhemorrhage noted within the left frontal lobe, of doubtful significance in isolation. No mass lesion, midline shift or mass effect. No hydrocephalus or extra-axial fluid collection. Pituitary gland suprasellar region normal. Midline structures intact. Vascular: Major intracranial vascular flow voids are maintained. Skull and upper cervical spine: Craniocervical junction normal. Bone marrow signal intensity within normal limits. No scalp soft tissue abnormality. Sinuses/Orbits: Globes and orbital soft tissues within normal limits. Paranasal sinuses are largely clear. No mastoid effusion. Inner ear structures grossly normal. Other: None. IMPRESSION: 1. No acute intracranial abnormality. 2. Generalized age-related cerebral atrophy with mild chronic small vessel ischemic disease. Electronically Signed   By: Jeannine Boga M.D.   On: 03/06/2020 00:21   ECHOCARDIOGRAM COMPLETE  Result Date: 03/06/2020    ECHOCARDIOGRAM REPORT   Patient Name:   BESSIE LIVINGOOD Date of Exam: 03/06/2020 Medical Rec #:  [id number]     Height:       69.0 in Accession #:    [id number]    Weight:       171.1 lb Date of Birth:  1940/08/16       BSA:          1.933 m Patient Age:    69 years      BP:           141/72 mmHg Patient Gender: M             HR:           56 bpm. Exam Location:  Inpatient Procedure: 2D Echo Indications:    Stroke I163.9  History:        Patient has prior history of Echocardiogram examinations, most                 recent 01/26/2019. CAD.                 Aortic Valve: 23 mm bioprosthetic valve is present in the aortic                 position. Procedure Date: 11/10/2018.  Sonographer:    Mikki Santee RDCS (AE) Referring Phys: [id number] [place]  1. Left ventricular  ejection fraction, by estimation, is 50 to 55%. The left ventricle has low normal function. The left ventricle has no regional wall motion abnormalities. There is mild concentric left ventricular hypertrophy. Left ventricular diastolic parameters are consistent with Grade I diastolic dysfunction (impaired relaxation).  2. Right ventricular systolic function is low normal. The right ventricular size is mildly enlarged. There is normal pulmonary artery systolic pressure. The estimated right ventricular systolic pressure is 69.6 mmHg.  3. Left atrial size was mild to moderately dilated.  4. Right atrial size was mildly dilated.  5. The mitral valve is grossly normal. Mild mitral valve regurgitation. No evidence of mitral stenosis.  6. 23 mm surgical prosthetic valve present with V max 2.8 m/s, MG 17 mmHG, EOA 1.40 cm2. The aortic valve has been repaired/replaced. Aortic valve regurgitation is not visualized. No aortic stenosis is present. There is a 23 mm bioprosthetic valve present in the aortic position. Procedure Date: 11/10/2018. Echo findings are consistent with normal structure and function of the aortic valve prosthesis.  7. The inferior vena cava is normal in size with greater than 50% respiratory variability, suggesting right atrial pressure of 3 mmHg. Comparison(s): No significant change from prior study. Conclusion(s)/Recommendation(s): No  intracardiac source of embolism detected on this transthoracic study. A transesophageal echocardiogram is recommended to exclude cardiac source of embolism if clinically indicated. FINDINGS  Left Ventricle: Left ventricular ejection fraction, by estimation, is 50 to 55%. The left ventricle has low normal function. The left ventricle has no regional wall motion abnormalities. The left ventricular internal cavity size was normal in size. There is mild concentric left ventricular hypertrophy. Abnormal (paradoxical) septal motion consistent with post-operative status. Left ventricular diastolic parameters are consistent with Grade I diastolic dysfunction (impaired relaxation). Right Ventricle: The right ventricular size is mildly enlarged. No increase in right ventricular wall thickness. Right ventricular systolic function is low normal. There is normal pulmonary artery systolic pressure. The tricuspid regurgitant velocity is 2.14 m/s, and with an assumed right atrial pressure of 3 mmHg, the estimated right ventricular systolic pressure is 29.5 mmHg. Left Atrium: Left atrial size was mild to moderately dilated. Right Atrium: Right atrial size was mildly dilated. Pericardium: There is no evidence of pericardial effusion. Mitral Valve: The mitral valve is grossly normal. Mild mitral valve regurgitation. No evidence of mitral valve stenosis. Tricuspid Valve: The tricuspid valve is grossly normal. Tricuspid valve regurgitation is mild . No evidence of tricuspid stenosis. Aortic Valve: 23 mm surgical prosthetic valve present with V max 2.8 m/s, MG 17 mmHG, EOA 1.40 cm2. The aortic valve has been repaired/replaced. Aortic valve regurgitation is not visualized. No aortic stenosis is present. Aortic valve mean gradient measures 17.0 mmHg. Aortic valve peak gradient measures 32.7 mmHg. Aortic valve area, by VTI measures 1.40 cm. There is a 23 mm bioprosthetic valve present in the aortic position. Procedure Date: 11/10/2018. Echo  findings are consistent with normal structure and function of the aortic valve prosthesis. Pulmonic Valve: The pulmonic valve was grossly normal. Pulmonic valve regurgitation is not visualized. No evidence of pulmonic stenosis. Aorta: The aortic root is normal in size and structure. Venous: The inferior vena cava is normal in size with greater than 50% respiratory variability, suggesting right atrial pressure of 3 mmHg. IAS/Shunts: The atrial septum is grossly normal. EKG: Rhythm strip during this exam demostrated sinus bradycardia and premature ventricular contractions.  LEFT VENTRICLE PLAX 2D LVIDd:         5.04 cm  Diastology LVIDs:  3.65 cm  LV e' lateral:   6.20 cm/s LV PW:         1.18 cm  LV E/e' lateral: 10.0 LV IVS:        1.21 cm  LV e' medial:    4.24 cm/s LVOT diam:     2.20 cm  LV E/e' medial:  14.6 LV SV:         89 LV SV Index:   46 LVOT Area:     3.80 cm  RIGHT VENTRICLE RV S prime:     11.50 cm/s TAPSE (M-mode): 1.3 cm LEFT ATRIUM              Index       RIGHT ATRIUM           Index LA diam:        4.60 cm  2.38 cm/m  RA Area:     27.80 cm LA Vol (A2C):   128.0 ml 66.21 ml/m RA Volume:   93.60 ml  48.41 ml/m LA Vol (A4C):   68.6 ml  35.48 ml/m LA Biplane Vol: 97.5 ml  50.43 ml/m  AORTIC VALVE AV Area (Vmax):    1.27 cm AV Area (Vmean):   1.40 cm AV Area (VTI):     1.40 cm AV Vmax:           286.00 cm/s AV Vmean:          194.000 cm/s AV VTI:            0.634 m AV Peak Grad:      32.7 mmHg AV Mean Grad:      17.0 mmHg LVOT Vmax:         95.80 cm/s LVOT Vmean:        71.500 cm/s LVOT VTI:          0.233 m LVOT/AV VTI ratio: 0.37  AORTA Ao Root diam: 3.50 cm MITRAL VALVE               TRICUSPID VALVE MV Area (PHT): 1.48 cm    TR Peak grad:   18.3 mmHg MV Decel Time: 511 msec    TR Vmax:        214.00 cm/s MV E velocity: 61.70 cm/s MV A velocity: 57.00 cm/s  SHUNTS MV E/A ratio:  1.08        Systemic VTI:  0.23 m                            Systemic Diam: 2.20 cm Eleonore Chiquito MD  Electronically signed by Eleonore Chiquito MD Signature Date/Time: 03/06/2020/12:23:13 PM    Final    CT ANGIO HEAD CODE STROKE  Result Date: 03/06/2020 CLINICAL DATA:  Initial evaluation for neuro deficit, stroke suspected. EXAM: CT ANGIOGRAPHY HEAD AND NECK TECHNIQUE: Multidetector CT imaging of the head and neck was performed using the standard protocol during bolus administration of intravenous contrast. Multiplanar CT image reconstructions and MIPs were obtained to evaluate the vascular anatomy. Carotid stenosis measurements (when applicable) are obtained utilizing NASCET criteria, using the distal internal carotid diameter as the denominator. CONTRAST:  3mL OMNIPAQUE IOHEXOL 350 MG/ML SOLN COMPARISON:  Prior head CT from earlier the same day. FINDINGS: CTA NECK FINDINGS Aortic arch: Visualized aortic arch normal in caliber with normal branch pattern. Mild atheromatous change about the arch itself. No visible hemodynamically significant stenosis about the origin of the great vessels. Right carotid system: Scattered  eccentric calcified plaque within the right CCA without flow-limiting stenosis. Additional eccentric calcified plaque at the origin of the right ICA without significant stenosis. Right ICA patent distally to the skull base without stenosis, dissection or occlusion. Left carotid system: Left CCA widely patent proximally. Concentric mixed plaque about the left carotid bifurcation with associated stenosis of up to 75% by NASCET criteria (series 8, image 233). Left ICA patent distally to the skull base without stenosis, dissection or occlusion. Vertebral arteries: Both vertebral arteries arise from the subclavian arteries. Eccentric calcified plaque at the origin of the right subclavian artery with approximate 50% stenosis (series 8, image 78). Moderate approximate 50% stenosis at the origin of the left vertebral artery (series 8, image 114). Vertebral arteries otherwise widely patent within the neck  without stenosis, dissection or occlusion. Skeleton: No acute osseous abnormality. Sclerotic lesion within the C3 spinous process most consistent with a small benign bone island. No other discrete or worrisome osseous lesions. Mild multilevel cervical spondylosis without high-grade stenosis. Other neck: No other acute soft tissue abnormality within the neck. No mass lesion or adenopathy. Upper chest: Visualized upper chest demonstrates no acute finding. Review of the MIP images confirms the above findings CTA HEAD FINDINGS Anterior circulation: Petrous segments widely patent bilaterally. Scattered atheromatous plaque throughout the carotid siphons with associated mild multifocal narrowing. ICA termini well perfused. A1 segments widely patent. Normal anterior communicating artery complex. Anterior cerebral arteries widely patent to their distal aspects without stenosis. No M1 stenosis or occlusion. Normal MCA bifurcations. Distal MCA branches well perfused and symmetric. Posterior circulation: Vertebral arteries patent to the vertebrobasilar junction without stenosis. Mild non stenotic plaque noted within the proximal left V4 segment. Left PICA patent. Right PICA not seen. Basilar patent to its distal aspect without stenosis. Superior cerebral arteries patent bilaterally. Both PCAs primarily supplied via the basilar perfused to their distal aspects without stenosis. Venous sinuses: Patent allowing for timing the contrast bolus. Anatomic variants: None significant.  No intracranial aneurysm. Review of the MIP images confirms the above findings IMPRESSION: 1. Negative CTA for emergent large vessel occlusion. 2. 75% atheromatous stenosis about the left carotid bifurcation. 3. 50% stenosis at the origin of the right subclavian artery. 4. Moderate approximate 50% stenosis at the origin of the left vertebral artery. 5. Additional mild scattered atherosclerotic change elsewhere about the major arterial vasculature of the  head and neck as above. No other hemodynamically significant or correctable stenosis. Electronically Signed   By: Jeannine Boga M.D.   On: 03/06/2020 00:46   CT ANGIO NECK CODE STROKE  Result Date: 03/06/2020 CLINICAL DATA:  Initial evaluation for neuro deficit, stroke suspected. EXAM: CT ANGIOGRAPHY HEAD AND NECK TECHNIQUE: Multidetector CT imaging of the head and neck was performed using the standard protocol during bolus administration of intravenous contrast. Multiplanar CT image reconstructions and MIPs were obtained to evaluate the vascular anatomy. Carotid stenosis measurements (when applicable) are obtained utilizing NASCET criteria, using the distal internal carotid diameter as the denominator. CONTRAST:  87mL OMNIPAQUE IOHEXOL 350 MG/ML SOLN COMPARISON:  Prior head CT from earlier the same day. FINDINGS: CTA NECK FINDINGS Aortic arch: Visualized aortic arch normal in caliber with normal branch pattern. Mild atheromatous change about the arch itself. No visible hemodynamically significant stenosis about the origin of the great vessels. Right carotid system: Scattered eccentric calcified plaque within the right CCA without flow-limiting stenosis. Additional eccentric calcified plaque at the origin of the right ICA without significant stenosis. Right ICA patent distally to the  skull base without stenosis, dissection or occlusion. Left carotid system: Left CCA widely patent proximally. Concentric mixed plaque about the left carotid bifurcation with associated stenosis of up to 75% by NASCET criteria (series 8, image 233). Left ICA patent distally to the skull base without stenosis, dissection or occlusion. Vertebral arteries: Both vertebral arteries arise from the subclavian arteries. Eccentric calcified plaque at the origin of the right subclavian artery with approximate 50% stenosis (series 8, image 78). Moderate approximate 50% stenosis at the origin of the left vertebral artery (series 8, image  114). Vertebral arteries otherwise widely patent within the neck without stenosis, dissection or occlusion. Skeleton: No acute osseous abnormality. Sclerotic lesion within the C3 spinous process most consistent with a small benign bone island. No other discrete or worrisome osseous lesions. Mild multilevel cervical spondylosis without high-grade stenosis. Other neck: No other acute soft tissue abnormality within the neck. No mass lesion or adenopathy. Upper chest: Visualized upper chest demonstrates no acute finding. Review of the MIP images confirms the above findings CTA HEAD FINDINGS Anterior circulation: Petrous segments widely patent bilaterally. Scattered atheromatous plaque throughout the carotid siphons with associated mild multifocal narrowing. ICA termini well perfused. A1 segments widely patent. Normal anterior communicating artery complex. Anterior cerebral arteries widely patent to their distal aspects without stenosis. No M1 stenosis or occlusion. Normal MCA bifurcations. Distal MCA branches well perfused and symmetric. Posterior circulation: Vertebral arteries patent to the vertebrobasilar junction without stenosis. Mild non stenotic plaque noted within the proximal left V4 segment. Left PICA patent. Right PICA not seen. Basilar patent to its distal aspect without stenosis. Superior cerebral arteries patent bilaterally. Both PCAs primarily supplied via the basilar perfused to their distal aspects without stenosis. Venous sinuses: Patent allowing for timing the contrast bolus. Anatomic variants: None significant.  No intracranial aneurysm. Review of the MIP images confirms the above findings IMPRESSION: 1. Negative CTA for emergent large vessel occlusion. 2. 75% atheromatous stenosis about the left carotid bifurcation. 3. 50% stenosis at the origin of the right subclavian artery. 4. Moderate approximate 50% stenosis at the origin of the left vertebral artery. 5. Additional mild scattered  atherosclerotic change elsewhere about the major arterial vasculature of the head and neck as above. No other hemodynamically significant or correctable stenosis. Electronically Signed   By: Jeannine Boga M.D.   On: 03/06/2020 00:46   Scheduled Meds: . aspirin EC  81 mg Oral Daily  . clopidogrel  75 mg Oral Daily  . rosuvastatin  20 mg Oral Daily  . sertraline  100 mg Oral Daily  . traZODone  50-100 mg Oral QHS   Continuous Infusions: . [START ON 03/08/2020]  ceFAZolin (ANCEF) IV      LOS: 1 day   Kerney Elbe, DO [facility] PAGER is on AMION  If 7PM-7AM, please contact night-coverage [email or web address]

## 2020-03-07 NOTE — Progress Notes (Signed)
Patient ID: Joshua Hess, male   DOB: 1940-12-24, 79 y.o.   MRN: [id number]  Progress Note    03/07/2020 11:19 AM * No surgery date entered *  Subjective: No new neurologic changes. Right facial droop.   Vitals:   03/07/20 0409 03/07/20 0733  BP: 138/78 133/70  Pulse: (!) 56 (!) 52  Resp: 18 18  Temp: 97.8 F (36.6 C) 98 F (36.7 C)  SpO2: 98% 97%   Physical Exam: No change  CBC    Component Value Date/Time   WBC 7.0 03/07/2020 0346   RBC 4.26 03/07/2020 0346   HGB 14.4 03/07/2020 0346   HGB 14.2 09/26/2018 1142   HCT 40.7 03/07/2020 0346   HCT 40.4 09/26/2018 1142   PLT 166 03/07/2020 0346   PLT 179 09/26/2018 1142   MCV 95.5 03/07/2020 0346   MCV 96 09/26/2018 1142   MCH 33.8 03/07/2020 0346   MCHC 35.4 03/07/2020 0346   RDW 11.8 03/07/2020 0346   RDW 11.8 09/26/2018 1142   LYMPHSABS 1.9 03/07/2020 0346   MONOABS 0.7 03/07/2020 0346   EOSABS 0.1 03/07/2020 0346   BASOSABS 0.0 03/07/2020 0346    BMET    Component Value Date/Time   NA 136 03/07/2020 0346   NA 142 09/26/2018 1142   K 4.5 03/07/2020 0346   CL 101 03/07/2020 0346   CO2 27 03/07/2020 0346   GLUCOSE 93 03/07/2020 0346   BUN 19 03/07/2020 0346   BUN 25 09/26/2018 1142   CREATININE 0.97 03/07/2020 0346   CREATININE 0.91 11/01/2012 1209   CALCIUM 9.0 03/07/2020 0346   GFRNONAA >60 03/07/2020 0346   GFRAA >60 03/07/2020 0346    INR    Component Value Date/Time   INR 1.1 03/05/2020 1209     Intake/Output Summary (Last 24 hours) at 03/07/2020 1119 Last data filed at 03/06/2020 1500 Gross per 24 hour  Intake 923.3 ml  Output --  Net 923.3 ml     Assessment/Plan:  79 y.o. male again discussed my recommendation for left carotid endarterectomy for reduction of stroke risk. The patient has decided to proceed while inpatient. I again reviewed the procedure and the slight percent risk of stroke risk. We will plan left carotid endarterectomy tomorrow. He is third case so will probably around  noon. Will be transferred to 4 E. postop     Rosetta Posner, MD [facility] [phone] 03/07/2020 11:19 AM

## 2020-03-07 NOTE — Anesthesia Preprocedure Evaluation (Addendum)
Anesthesia Evaluation  Patient identified by MRN, date of birth, ID band Patient awake    Reviewed: Allergy & Precautions, NPO status , Patient's Chart, lab work & pertinent test results  Airway Mallampati: II  TM Distance: >3 FB Neck ROM: Full    Dental  (+) Teeth Intact, Missing, Partial Lower, Dental Advisory Given   Pulmonary former smoker,    Pulmonary exam normal breath sounds clear to auscultation       Cardiovascular + CAD  + Valvular Problems/Murmurs AS  Rhythm:Regular Rate:Normal + Systolic murmurs    Neuro/Psych negative neurological ROS  negative psych ROS   GI/Hepatic negative GI ROS, Neg liver ROS,   Endo/Other  negative endocrine ROS  Renal/GU negative Renal ROS     Musculoskeletal negative musculoskeletal ROS (+)   Abdominal   Peds  Hematology negative hematology ROS (+)   Anesthesia Other Findings   Reproductive/Obstetrics                                                             Anesthesia Evaluation  Patient identified by MRN, date of birth, ID band Patient awake    Reviewed: Allergy & Precautions, NPO status , Patient's Chart, lab work & pertinent test results  Airway Mallampati: I  TM Distance: >3 FB Neck ROM: Full    Dental no notable dental hx.    Pulmonary former smoker,    Pulmonary exam normal breath sounds clear to auscultation       Cardiovascular + Valvular Problems/Murmurs AS  Rhythm:Regular Rate:Normal + Systolic murmurs ECG: SR, rate 59  CATH: Prox RCA to Mid RCA lesion is 20% stenosed. Mid RCA lesion is 30% stenosed. Ost Cx to Prox Cx lesion is 30% stenosed. Prox LAD lesion is 10% stenosed. Mid LAD lesion is 20% stenosed. Mid LAD to Dist LAD lesion is 20% stenosed.  1. Mild non-obstructive CAD 2. Severe aortic stenosis by echo (by cath mean gradient 17. 67mmHg and peak to peak gradient 24 mmHg)  ECHO:  1. The left  ventricle appears to be mildly increased in size, have normal wall thickness, with 60-65%. Echo evidence of impaired relaxation in diastolic filling patterns.  2. Right ventricular systolic pressure is is moderately elevated.  3. The right ventricle is normal in size, has normal wall thickness and normal systolic function.  4. Normal left atrial size.  5. Normal right atrial size.  6. The mitral valve is degenerative.  7. Normal tricuspid valve.  8. Aortic valve regurgitation is moderate by color flow Doppler.  9. Aortic valve tricuspid. 10. Severe stenosis of the aortic valve. 11. There is moderate sclerosis of the aortic valve. 12. There is severe thickening of the aortic valve. 13. No atrial level shunt detected by color flow Doppler.   Neuro/Psych negative neurological ROS  negative psych ROS   GI/Hepatic negative GI ROS, Neg liver ROS,   Endo/Other  negative endocrine ROS  Renal/GU negative Renal ROS     Musculoskeletal negative musculoskeletal ROS (+)   Abdominal   Peds  Hematology negative hematology ROS (+)   Anesthesia Other Findings SEVERE AS  Reproductive/Obstetrics                           Anesthesia Physical Anesthesia Plan  ASA: IV  Anesthesia Plan: General   Post-op Pain Management:    Induction: Intravenous  PONV Risk Score and Plan: 2 and Midazolam, Ondansetron, Dexamethasone and Treatment may vary due to age or medical condition  Airway Management Planned: Oral ETT  Additional Equipment: Arterial line, TEE and CVP  Intra-op Plan:   Post-operative Plan: Post-operative intubation/ventilation  Informed Consent: I have reviewed the patients History and Physical, chart, labs and discussed the procedure including the risks, benefits and alternatives for the proposed anesthesia with the patient or authorized representative who has indicated his/her understanding and acceptance.     Dental advisory given  Plan Discussed  with: CRNA  Anesthesia Plan Comments:       Anesthesia Quick Evaluation  Anesthesia Physical  Anesthesia Plan  ASA: III  Anesthesia Plan: General   Post-op Pain Management:    Induction: Intravenous  PONV Risk Score and Plan: 4 or greater and Ondansetron, Dexamethasone and Treatment may vary due to age or medical condition  Airway Management Planned: Oral ETT  Additional Equipment: Arterial line  Intra-op Plan:   Post-operative Plan: Extubation in OR  Informed Consent: I have reviewed the patients History and Physical, chart, labs and discussed the procedure including the risks, benefits and alternatives for the proposed anesthesia with the patient or authorized representative who has indicated his/her understanding and acceptance.   Patient has DNR.  Discussed DNR with patient and Suspend DNR.   Dental advisory given  Plan Discussed with: CRNA  Anesthesia Plan Comments: (Follows with cardiology for hx of severe aortic stenosis s/p bioprosthetic AVR 11/10/18, post op atrial fib, mild CAD and carotid artery disease. Last seen by Dr. Angelena Form 11/02/19, doing well, cleared for surgery. Per note, "Pre-operative cardiac risk assessment: He has no limitations. He is very active. Known to have mild CAD. No worrisome cardiac findings. He can proceed with his planned dental surgery and hernia surgery. OK to hold ASA one week prior to his procedure if this is necessary."  Being followed by vascular for carotid stenosis. Last seen 08/24/19. Per note, recent carotid duplex was ordered by PCP to eval transient episode of difficulty with word finding which showed 50 to 69% stenosis of left ICA similar to 01/2018 carotid duplex. Recommended repeat in 6 months. Pt was also seen by neurology regarding this, MRI of the brain 09/09/2019 showed mild chronic small vessel ischemic disease, no acute findings. They also recommended f/u carotid duplex in 6 months as planned.   Preop labs reviewed,  unremarkable.   EKG 07/31/19: Sinus rhythm with frequent Premature ventricular complexes and Premature atrial complexes. Rate 69. Anterior infarct , age undetermined  TTE 01/26/19: 1. The left ventricle has low normal systolic function, with an ejection  fraction of 50-55%. The cavity size was normal. Left ventricular diastolic  Doppler parameters are consistent with impaired relaxation.  2. LVEF is normal at 50 to 55% with anterior and lateral hypokinesis.  3. The right ventricle has normal systolic function.  4. AV prosthesis is difficult to see. Peak and mean gradients through the  prosthesis are 33 and 16 mm Hg respectively.   Event monitor 01/05/19: Sinus rhythm with sinus bradycardia Short runs of supraventricular tachycardia. (longest 9 beats) Rare premature ventricular contractions Rare premature atrial contractions   Cath 09/29/18 (pre AVR): 1. Mild non-obstructive CAD 2. Severe aortic stenosis by echo (by cath mean gradient 17. 59mmHg and peak to peak gradient 24 mmHg))       Anesthesia Quick Evaluation

## 2020-03-07 NOTE — H&P (View-Only) (Signed)
Patient ID: Joshua Hess, male   DOB: Aug 27, 1940, 79 y.o.   MRN: [id number]  Progress Note    03/07/2020 11:19 AM * No surgery date entered *  Subjective: No new neurologic changes. Right facial droop.   Vitals:   03/07/20 0409 03/07/20 0733  BP: 138/78 133/70  Pulse: (!) 56 (!) 52  Resp: 18 18  Temp: 97.8 F (36.6 C) 98 F (36.7 C)  SpO2: 98% 97%   Physical Exam: No change  CBC    Component Value Date/Time   WBC 7.0 03/07/2020 0346   RBC 4.26 03/07/2020 0346   HGB 14.4 03/07/2020 0346   HGB 14.2 09/26/2018 1142   HCT 40.7 03/07/2020 0346   HCT 40.4 09/26/2018 1142   PLT 166 03/07/2020 0346   PLT 179 09/26/2018 1142   MCV 95.5 03/07/2020 0346   MCV 96 09/26/2018 1142   MCH 33.8 03/07/2020 0346   MCHC 35.4 03/07/2020 0346   RDW 11.8 03/07/2020 0346   RDW 11.8 09/26/2018 1142   LYMPHSABS 1.9 03/07/2020 0346   MONOABS 0.7 03/07/2020 0346   EOSABS 0.1 03/07/2020 0346   BASOSABS 0.0 03/07/2020 0346    BMET    Component Value Date/Time   NA 136 03/07/2020 0346   NA 142 09/26/2018 1142   K 4.5 03/07/2020 0346   CL 101 03/07/2020 0346   CO2 27 03/07/2020 0346   GLUCOSE 93 03/07/2020 0346   BUN 19 03/07/2020 0346   BUN 25 09/26/2018 1142   CREATININE 0.97 03/07/2020 0346   CREATININE 0.91 11/01/2012 1209   CALCIUM 9.0 03/07/2020 0346   GFRNONAA >60 03/07/2020 0346   GFRAA >60 03/07/2020 0346    INR    Component Value Date/Time   INR 1.1 03/05/2020 1209     Intake/Output Summary (Last 24 hours) at 03/07/2020 1119 Last data filed at 03/06/2020 1500 Gross per 24 hour  Intake 923.3 ml  Output --  Net 923.3 ml     Assessment/Plan:  79 y.o. male again discussed my recommendation for left carotid endarterectomy for reduction of stroke risk. The patient has decided to proceed while inpatient. I again reviewed the procedure and the slight percent risk of stroke risk. We will plan left carotid endarterectomy tomorrow. He is third case so will probably around  noon. Will be transferred to 4 E. postop     Rosetta Posner, MD [facility] [phone] 03/07/2020 11:19 AM

## 2020-03-08 ENCOUNTER — Inpatient Hospital Stay (HOSPITAL_COMMUNITY): Payer: Medicare HMO | Admitting: Certified Registered Nurse Anesthetist

## 2020-03-08 ENCOUNTER — Encounter (HOSPITAL_COMMUNITY)
Admission: EM | Disposition: A | Discharge status: Home / Self Care | Payer: Self-pay | Source: Home / Self Care | Attending: Internal Medicine

## 2020-03-08 HISTORY — PX: ENDARTERECTOMY: SHX5162

## 2020-03-08 LAB — CBC WITH DIFFERENTIAL/PLATELET
Abs Immature Granulocytes: 0.02 10*3/uL (ref 0.00–0.07)
Basophils Absolute: 0 10*3/uL (ref 0.0–0.1)
Basophils Relative: 0 %
Eosinophils Absolute: 0.2 10*3/uL (ref 0.0–0.5)
Eosinophils Relative: 2 %
HCT: 40.2 % (ref 39.0–52.0)
Hemoglobin: 14 g/dL (ref 13.0–17.0)
Immature Granulocytes: 0 %
Lymphocytes Relative: 36 %
Lymphs Abs: 2.4 10*3/uL (ref 0.7–4.0)
MCH: 33.6 pg (ref 26.0–34.0)
MCHC: 34.8 g/dL (ref 30.0–36.0)
MCV: 96.4 fL (ref 80.0–100.0)
Monocytes Absolute: 0.8 10*3/uL (ref 0.1–1.0)
Monocytes Relative: 11 %
Neutro Abs: 3.4 10*3/uL (ref 1.7–7.7)
Neutrophils Relative %: 51 %
Platelets: 163 10*3/uL (ref 150–400)
RBC: 4.17 MIL/uL — ABNORMAL LOW (ref 4.22–5.81)
RDW: 12 % (ref 11.5–15.5)
WBC: 6.8 10*3/uL (ref 4.0–10.5)
nRBC: 0 % (ref 0.0–0.2)

## 2020-03-08 LAB — MAGNESIUM: Magnesium: 2.5 mg/dL — ABNORMAL HIGH (ref 1.7–2.4)

## 2020-03-08 LAB — COMPREHENSIVE METABOLIC PANEL
ALT: 16 U/L (ref 0–44)
AST: 18 U/L (ref 15–41)
Albumin: 3.4 g/dL — ABNORMAL LOW (ref 3.5–5.0)
Alkaline Phosphatase: 43 U/L (ref 38–126)
Anion gap: 10 (ref 5–15)
BUN: 18 mg/dL (ref 8–23)
CO2: 25 mmol/L (ref 22–32)
Calcium: 8.9 mg/dL (ref 8.9–10.3)
Chloride: 104 mmol/L (ref 98–111)
Creatinine, Ser: 1.09 mg/dL (ref 0.61–1.24)
GFR calc Af Amer: >60 mL/min (ref >60)
GFR calc non Af Amer: >60 mL/min (ref >60)
Glucose, Bld: 91 mg/dL (ref 70–99)
Potassium: 4.3 mmol/L (ref 3.5–5.1)
Sodium: 139 mmol/L (ref 135–145)
Total Bilirubin: 0.8 mg/dL (ref 0.3–1.2)
Total Protein: 6.4 g/dL — ABNORMAL LOW (ref 6.5–8.1)

## 2020-03-08 LAB — CREATININE, SERUM
Creatinine, Ser: 1.15 mg/dL (ref 0.61–1.24)
GFR calc Af Amer: >60 mL/min (ref >60)
GFR calc non Af Amer: >60 mL/min (ref >60)

## 2020-03-08 LAB — CBC
HCT: 38.1 % — ABNORMAL LOW (ref 39.0–52.0)
Hemoglobin: 13.2 g/dL (ref 13.0–17.0)
MCH: 33.7 pg (ref 26.0–34.0)
MCHC: 34.6 g/dL (ref 30.0–36.0)
MCV: 97.2 fL (ref 80.0–100.0)
Platelets: 159 10*3/uL (ref 150–400)
RBC: 3.92 MIL/uL — ABNORMAL LOW (ref 4.22–5.81)
RDW: 11.9 % (ref 11.5–15.5)
WBC: 8 10*3/uL (ref 4.0–10.5)
nRBC: 0 % (ref 0.0–0.2)

## 2020-03-08 LAB — PHOSPHORUS: Phosphorus: 4.5 mg/dL (ref 2.5–4.6)

## 2020-03-08 SURGERY — ENDARTERECTOMY, CAROTID
Anesthesia: General | Site: Neck | Laterality: Left

## 2020-03-08 MED ORDER — SODIUM CHLORIDE 0.9 % IV SOLN
0.0125 ug/kg/min | INTRAVENOUS | Status: AC
  Administered 2020-03-08: .1 ug/kg/min via INTRAVENOUS
  Filled 2020-03-08: qty 2000

## 2020-03-08 MED ORDER — SODIUM CHLORIDE 0.9 % IV SOLN
500.0000 mL | Freq: Once | INTRAVENOUS | Status: AC | PRN
  Administered 2020-03-08: 500 mL via INTRAVENOUS

## 2020-03-08 MED ORDER — ADULT MULTIVITAMIN W/MINERALS CH: 1.0000 | ORAL_TABLET | Freq: Every day | ORAL | Status: DC

## 2020-03-08 MED ORDER — LABETALOL HCL 5 MG/ML IV SOLN: 10.0000 mg | INTRAVENOUS | Status: DC | PRN

## 2020-03-08 MED ORDER — FENTANYL CITRATE (PF) 100 MCG/2ML IJ SOLN
INTRAMUSCULAR | Status: DC | PRN
  Administered 2020-03-08 (×3): 50 ug via INTRAVENOUS
  Administered 2020-03-08: 100 ug via INTRAVENOUS

## 2020-03-08 MED ORDER — OXYCODONE-ACETAMINOPHEN 5-325 MG PO TABS
1.0000 | ORAL_TABLET | ORAL | Status: DC | PRN
  Administered 2020-03-08: 1 via ORAL
  Filled 2020-03-08: qty 1

## 2020-03-08 MED ORDER — ONDANSETRON HCL 4 MG/2ML IJ SOLN
INTRAMUSCULAR | Status: DC | PRN
  Administered 2020-03-08: 4 mg via INTRAVENOUS

## 2020-03-08 MED ORDER — POTASSIUM CHLORIDE CRYS ER 20 MEQ PO TBCR: 20.0000 meq | EXTENDED_RELEASE_TABLET | Freq: Every day | ORAL | Status: DC | PRN

## 2020-03-08 MED ORDER — PHENYLEPHRINE HCL-NACL 10-0.9 MG/250ML-% IV SOLN
INTRAVENOUS | Status: DC | PRN
  Administered 2020-03-08: 20 ug/min via INTRAVENOUS

## 2020-03-08 MED ORDER — MAGNESIUM SULFATE 2 GM/50ML IV SOLN: 2.0000 g | Freq: Every day | INTRAVENOUS | Status: DC | PRN

## 2020-03-08 MED ORDER — EPHEDRINE 5 MG/ML INJ
INTRAVENOUS | Status: AC
  Filled 2020-03-08: qty 10

## 2020-03-08 MED ORDER — FINASTERIDE 5 MG PO TABS
5.0000 mg | ORAL_TABLET | Freq: Every day | ORAL | Status: DC
  Administered 2020-03-08 – 2020-03-09 (×2): 5 mg via ORAL
  Filled 2020-03-08 (×2): qty 1

## 2020-03-08 MED ORDER — SUGAMMADEX SODIUM 200 MG/2ML IV SOLN
INTRAVENOUS | Status: DC | PRN
  Administered 2020-03-08: 200 mg via INTRAVENOUS

## 2020-03-08 MED ORDER — ONDANSETRON HCL 4 MG/2ML IJ SOLN: 4.0000 mg | Freq: Four times a day (QID) | INTRAMUSCULAR | Status: DC | PRN

## 2020-03-08 MED ORDER — FENTANYL CITRATE (PF) 100 MCG/2ML IJ SOLN: 25.0000 ug | INTRAMUSCULAR | Status: DC | PRN

## 2020-03-08 MED ORDER — HEPARIN SODIUM (PORCINE) 1000 UNIT/ML IJ SOLN
INTRAMUSCULAR | Status: AC
  Filled 2020-03-08: qty 1

## 2020-03-08 MED ORDER — LACTATED RINGERS IV SOLN: INTRAVENOUS | Status: DC | PRN

## 2020-03-08 MED ORDER — HYDRALAZINE HCL 20 MG/ML IJ SOLN: 5.0000 mg | INTRAMUSCULAR | Status: DC | PRN

## 2020-03-08 MED ORDER — ALUM & MAG HYDROXIDE-SIMETH 200-200-20 MG/5ML PO SUSP: 15.0000 mL | ORAL | Status: DC | PRN

## 2020-03-08 MED ORDER — DEXAMETHASONE SODIUM PHOSPHATE 10 MG/ML IJ SOLN
INTRAMUSCULAR | Status: DC | PRN
  Administered 2020-03-08: 10 mg via INTRAVENOUS

## 2020-03-08 MED ORDER — LIDOCAINE 2% (20 MG/ML) 5 ML SYRINGE
INTRAMUSCULAR | Status: AC
  Filled 2020-03-08: qty 5

## 2020-03-08 MED ORDER — SODIUM CHLORIDE 0.9 % IV SOLN: INTRAVENOUS | Status: DC

## 2020-03-08 MED ORDER — ROCURONIUM BROMIDE 10 MG/ML (PF) SYRINGE
PREFILLED_SYRINGE | INTRAVENOUS | Status: DC | PRN
  Administered 2020-03-08: 60 mg via INTRAVENOUS
  Administered 2020-03-08: 20 mg via INTRAVENOUS

## 2020-03-08 MED ORDER — ALPRAZOLAM 0.5 MG PO TABS: 0.5000 mg | ORAL_TABLET | Freq: Every day | ORAL | Status: DC | PRN

## 2020-03-08 MED ORDER — EPHEDRINE SULFATE-NACL 50-0.9 MG/10ML-% IV SOSY
PREFILLED_SYRINGE | INTRAVENOUS | Status: DC | PRN
  Administered 2020-03-08 (×2): 10 mg via INTRAVENOUS
  Administered 2020-03-08: 5 mg via INTRAVENOUS
  Administered 2020-03-08: 10 mg via INTRAVENOUS
  Administered 2020-03-08 (×2): 5 mg via INTRAVENOUS

## 2020-03-08 MED ORDER — PROPOFOL 10 MG/ML IV BOLUS
INTRAVENOUS | Status: DC | PRN
  Administered 2020-03-08: 110 mg via INTRAVENOUS

## 2020-03-08 MED ORDER — PROPOFOL 10 MG/ML IV BOLUS
INTRAVENOUS | Status: AC
  Filled 2020-03-08: qty 20

## 2020-03-08 MED ORDER — PHENYLEPHRINE 40 MCG/ML (10ML) SYRINGE FOR IV PUSH (FOR BLOOD PRESSURE SUPPORT)
PREFILLED_SYRINGE | INTRAVENOUS | Status: DC | PRN
  Administered 2020-03-08 (×4): 40 ug via INTRAVENOUS

## 2020-03-08 MED ORDER — ONDANSETRON HCL 4 MG/2ML IJ SOLN: 4.0000 mg | Freq: Once | INTRAMUSCULAR | Status: DC | PRN

## 2020-03-08 MED ORDER — ENOXAPARIN SODIUM 40 MG/0.4ML ~~LOC~~ SOLN
40.0000 mg | SUBCUTANEOUS | Status: DC
  Administered 2020-03-09: 40 mg via SUBCUTANEOUS
  Filled 2020-03-08: qty 0.4

## 2020-03-08 MED ORDER — SODIUM CHLORIDE 0.9 % IV SOLN
INTRAVENOUS | Status: DC | PRN
  Administered 2020-03-08: 500 mL

## 2020-03-08 MED ORDER — PHENOL 1.4 % MT LIQD
1.0000 | OROMUCOSAL | Status: DC | PRN
  Administered 2020-03-08: 1 via OROMUCOSAL
  Filled 2020-03-08: qty 177

## 2020-03-08 MED ORDER — 0.9 % SODIUM CHLORIDE (POUR BTL) OPTIME
TOPICAL | Status: DC | PRN
  Administered 2020-03-08: 2000 mL

## 2020-03-08 MED ORDER — ASPIRIN 81 MG PO TBEC: 81.0000 mg | DELAYED_RELEASE_TABLET | Freq: Every day | ORAL | Status: DC

## 2020-03-08 MED ORDER — POLYETHYLENE GLYCOL 3350 17 G PO PACK
17.0000 g | PACK | Freq: Every day | ORAL | Status: DC | PRN
  Administered 2020-03-09: 17 g via ORAL
  Filled 2020-03-08: qty 1

## 2020-03-08 MED ORDER — FENTANYL CITRATE (PF) 250 MCG/5ML IJ SOLN
INTRAMUSCULAR | Status: AC
  Filled 2020-03-08: qty 5

## 2020-03-08 MED ORDER — HEPARIN SODIUM (PORCINE) 1000 UNIT/ML IJ SOLN
INTRAMUSCULAR | Status: DC | PRN
  Administered 2020-03-08: 8000 [IU] via INTRAVENOUS

## 2020-03-08 MED ORDER — SODIUM CHLORIDE 0.9 % IV SOLN
INTRAVENOUS | Status: AC
  Filled 2020-03-08: qty 1.2

## 2020-03-08 MED ORDER — ONDANSETRON HCL 4 MG/2ML IJ SOLN
INTRAMUSCULAR | Status: AC
  Filled 2020-03-08: qty 2

## 2020-03-08 MED ORDER — ROSUVASTATIN CALCIUM 5 MG PO TABS: 10.0000 mg | ORAL_TABLET | Freq: Every morning | ORAL | Status: DC

## 2020-03-08 MED ORDER — DOCUSATE SODIUM 100 MG PO CAPS
100.0000 mg | ORAL_CAPSULE | Freq: Every day | ORAL | Status: DC
  Administered 2020-03-09: 100 mg via ORAL
  Filled 2020-03-08: qty 1

## 2020-03-08 MED ORDER — ROCURONIUM BROMIDE 10 MG/ML (PF) SYRINGE
PREFILLED_SYRINGE | INTRAVENOUS | Status: AC
  Filled 2020-03-08: qty 10

## 2020-03-08 MED ORDER — APOAEQUORIN 10 MG PO CAPS: 10.0000 mg | ORAL_CAPSULE | Freq: Every day | ORAL | Status: DC

## 2020-03-08 MED ORDER — PROTAMINE SULFATE 10 MG/ML IV SOLN
INTRAVENOUS | Status: DC | PRN
  Administered 2020-03-08: 50 mg via INTRAVENOUS

## 2020-03-08 MED ORDER — LIDOCAINE 2% (20 MG/ML) 5 ML SYRINGE
INTRAMUSCULAR | Status: DC | PRN
  Administered 2020-03-08: 100 mg via INTRAVENOUS

## 2020-03-08 MED ORDER — CEFAZOLIN SODIUM-DEXTROSE 2-4 GM/100ML-% IV SOLN
2.0000 g | Freq: Three times a day (TID) | INTRAVENOUS | Status: AC
  Administered 2020-03-08 (×2): 2 g via INTRAVENOUS
  Filled 2020-03-08 (×2): qty 100

## 2020-03-08 MED ORDER — METOPROLOL TARTRATE 5 MG/5ML IV SOLN: 2.0000 mg | INTRAVENOUS | Status: DC | PRN

## 2020-03-08 MED ORDER — GUAIFENESIN-DM 100-10 MG/5ML PO SYRP: 15.0000 mL | ORAL_SOLUTION | ORAL | Status: DC | PRN

## 2020-03-08 MED ORDER — LIDOCAINE HCL (PF) 1 % IJ SOLN
INTRAMUSCULAR | Status: AC
  Filled 2020-03-08: qty 30

## 2020-03-08 MED ORDER — PANTOPRAZOLE SODIUM 40 MG PO TBEC
40.0000 mg | DELAYED_RELEASE_TABLET | Freq: Every day | ORAL | Status: DC
  Administered 2020-03-09: 40 mg via ORAL
  Filled 2020-03-08: qty 1

## 2020-03-08 MED ORDER — MORPHINE SULFATE (PF) 2 MG/ML IV SOLN: 2.0000 mg | INTRAVENOUS | Status: DC | PRN

## 2020-03-08 MED ORDER — DEXAMETHASONE SODIUM PHOSPHATE 10 MG/ML IJ SOLN
INTRAMUSCULAR | Status: AC
  Filled 2020-03-08: qty 1

## 2020-03-08 MED ORDER — PHENYLEPHRINE 40 MCG/ML (10ML) SYRINGE FOR IV PUSH (FOR BLOOD PRESSURE SUPPORT)
PREFILLED_SYRINGE | INTRAVENOUS | Status: AC
  Filled 2020-03-08: qty 10

## 2020-03-08 SURGICAL SUPPLY — 42 items
CANISTER SUCT 3000ML PPV (MISCELLANEOUS) ×2 IMPLANT
CANNULA VESSEL 3MM 2 BLNT TIP (CANNULA) ×4 IMPLANT
CATH ROBINSON RED A/P 18FR (CATHETERS) ×2 IMPLANT
CATH SUCT 10FR WHISTLE TIP (CATHETERS) ×2 IMPLANT
CLIP LIGATING EXTRA MED SLVR (CLIP) ×2 IMPLANT
CLIP LIGATING EXTRA SM BLUE (MISCELLANEOUS) ×2 IMPLANT
COVER WAND RF STERILE (DRAPES) ×2 IMPLANT
DECANTER SPIKE VIAL GLASS SM (MISCELLANEOUS) IMPLANT
DERMABOND ADHESIVE PROPEN (GAUZE/BANDAGES/DRESSINGS) ×1
DERMABOND ADVANCED (GAUZE/BANDAGES/DRESSINGS) ×1
DERMABOND ADVANCED .7 DNX12 (GAUZE/BANDAGES/DRESSINGS) ×1 IMPLANT
DERMABOND ADVANCED .7 DNX6 (GAUZE/BANDAGES/DRESSINGS) IMPLANT
DRAIN HEMOVAC 1/8 X 5 (WOUND CARE) IMPLANT
ELECT REM PT RETURN 9FT ADLT (ELECTROSURGICAL) ×2
ELECTRODE REM PT RTRN 9FT ADLT (ELECTROSURGICAL) ×1 IMPLANT
EVACUATOR SILICONE 100CC (DRAIN) IMPLANT
GLOVE SS BIOGEL STRL SZ 7.5 (GLOVE) ×1 IMPLANT
GLOVE SUPERSENSE BIOGEL SZ 7.5 (GLOVE) ×1
GOWN STRL REUS W/ TWL LRG LVL3 (GOWN DISPOSABLE) ×3 IMPLANT
GOWN STRL REUS W/TWL LRG LVL3 (GOWN DISPOSABLE) ×3
KIT BASIN OR (CUSTOM PROCEDURE TRAY) ×2 IMPLANT
KIT SHUNT ARGYLE CAROTID ART 6 (VASCULAR PRODUCTS) IMPLANT
KIT TURNOVER KIT B (KITS) ×2 IMPLANT
NEEDLE 22X1 1/2 (OR ONLY) (NEEDLE) IMPLANT
NS IRRIG 1000ML POUR BTL (IV SOLUTION) ×4 IMPLANT
PACK CAROTID (CUSTOM PROCEDURE TRAY) ×2 IMPLANT
PAD ARMBOARD 7.5X6 YLW CONV (MISCELLANEOUS) ×4 IMPLANT
PATCH HEMASHIELD 8X150 (Vascular Products) ×1 IMPLANT
POSITIONER HEAD DONUT 9IN (MISCELLANEOUS) ×2 IMPLANT
SHUNT CAROTID BYPASS 10 (VASCULAR PRODUCTS) IMPLANT
SHUNT CAROTID BYPASS 12FRX15.5 (VASCULAR PRODUCTS) IMPLANT
SPONGE LAP 18X18 RF (DISPOSABLE) ×1 IMPLANT
SUT ETHILON 3 0 PS 1 (SUTURE) IMPLANT
SUT PROLENE 6 0 CC (SUTURE) ×3 IMPLANT
SUT SILK 3 0 (SUTURE)
SUT SILK 3-0 18XBRD TIE 12 (SUTURE) IMPLANT
SUT VIC AB 3-0 SH 27 (SUTURE) ×2
SUT VIC AB 3-0 SH 27X BRD (SUTURE) ×2 IMPLANT
SUT VICRYL 4-0 PS2 18IN ABS (SUTURE) ×2 IMPLANT
SYR CONTROL 10ML LL (SYRINGE) IMPLANT
TOWEL GREEN STERILE (TOWEL DISPOSABLE) ×2 IMPLANT
WATER STERILE IRR 1000ML POUR (IV SOLUTION) ×2 IMPLANT

## 2020-03-08 NOTE — Transfer of Care (Signed)
Immediate Anesthesia Transfer of Care Note  Patient: HOLLISTER WESSLER  Procedure(s) Performed: ENDARTERECTOMY CAROTID (Left Neck)  Patient Location: PACU  Anesthesia Type:General  Level of Consciousness: awake, alert  and oriented  Airway & Oxygen Therapy: Patient Spontanous Breathing and Patient connected to face mask oxygen  Post-op Assessment: Report given to RN and Post -op Vital signs reviewed and stable  Post vital signs: Reviewed and stable  Last Vitals:  Vitals Value Taken Time  BP 125/81 03/08/20 1037  Temp    Pulse 79 03/08/20 1038  Resp 10 03/08/20 1038  SpO2 99 % 03/08/20 1038  Vitals shown include unvalidated device data.  Last Pain:  Vitals:   03/08/20 0354  TempSrc: Oral  PainSc: 0-No pain         Complications: No complications documented.

## 2020-03-08 NOTE — Progress Notes (Signed)
Mobility Specialist - Progress Note   03/08/20 1521  Mobility  Activity Ambulated in hall  Level of Assistance Contact guard assist, steadying assist  Assistive Device None  Distance Ambulated (ft) 320 ft  Mobility Response Tolerated fair  Mobility performed by Mobility specialist  $Mobility charge 1 Mobility    Pre-mobility: 55 HR, 115/54 BP, 96% SpO2 During mobility: 79 HR Post-mobility: 66 HR, 99/62 BP, 96% SpO2  Pt required steadying assist from myself and rails along the edge of the wall as he did not want to use a walker. He felt increasingly unsteady while ambulating, however, he stated that he feels he is improved compared to the last time he ambulated.   Pricilla Handler Mobility Specialist Mobility Specialist Phone: [phone]

## 2020-03-08 NOTE — Anesthesia Procedure Notes (Signed)
Arterial Line Insertion Start/End8/27/2021 7:36 AM, 03/08/2020 7:36 AM Performed by: Lavell Luster, CRNA  Preanesthetic checklist: patient identified, IV checked, risks and benefits discussed, surgical consent, monitors and equipment checked, pre-op evaluation and timeout performed Lidocaine 1% used for infiltration Left, radial was placed Catheter size: 20 G Hand hygiene performed , maximum sterile barriers used  and Seldinger technique used Allen's test indicative of satisfactory collateral circulation Attempts: 2 Procedure performed without using ultrasound guided technique. Following insertion, Biopatch and dressing applied. Post procedure assessment: normal  Patient tolerated the procedure well with no immediate complications.

## 2020-03-08 NOTE — Discharge Instructions (Signed)
   [facility]  Discharge Instructions   Carotid Surgery  Please refer to the following instructions for your post-procedure care. Your surgeon or physician assistant will discuss any changes with you.  Activity  You are encouraged to walk as much as you can. You can slowly return to normal activities but must avoid strenuous activity and heavy lifting until your doctor tell you it's okay. Avoid activities such as vacuuming or swinging a golf club. You can drive after one week if you are comfortable and you are no longer taking prescription pain medications. It is normal to feel tired for serval weeks after your surgery. It is also normal to have difficulty with sleep habits, eating, and bowel movements after surgery. These will go away with time.  Bathing/Showering  Shower daily after you go home. Do not soak in a bathtub, hot tub, or swim until the incision heals completely.  Incision Care  Shower every day. Clean your incision with mild soap and water. Pat the area dry with a clean towel. You do not need a bandage unless otherwise instructed. Do not apply any ointments or creams to your incision. You may have skin glue on your incision. Do not peel it off. It will come off on its own in about one week. Your incision may feel thickened and raised for several weeks after your surgery. This is normal and the skin will soften over time.   For Men Only: It's okay to shave around the incision but do not shave the incision itself for 2 weeks. It is common to have numbness under your chin that could last for several months.  Diet  Resume your normal diet. There are no special food restrictions following this procedure. A low fat/low cholesterol diet is recommended for all patients with vascular disease. In order to heal from your surgery, it is CRITICAL to get adequate nutrition. Your body requires vitamins, minerals, and protein. Vegetables are the best source of  vitamins and minerals. Vegetables also provide the perfect balance of protein. Processed food has little nutritional value, so try to avoid this.  Medications  Resume taking all of your medications unless your doctor or physician assistant tells you not to. If your incision is causing pain, you may take over-the- counter pain relievers such as acetaminophen (Tylenol). If you were prescribed a stronger pain medication, please be aware these medications can cause nausea and constipation. Prevent nausea by taking the medication with a snack or meal. Avoid constipation by drinking plenty of fluids and eating foods with a high amount of fiber, such as fruits, vegetables, and grains.   Do not take Tylenol if you are taking prescription pain medications.  Follow Up  Our office will schedule a follow up appointment 2-3 weeks following discharge.  Please call us immediately for any of the following conditions  . Increased pain, redness, drainage (pus) from your incision site. . Fever of 101 degrees or higher. . If you should develop stroke (slurred speech, difficulty swallowing, weakness on one side of your body, loss of vision) you should call 911 and go to the nearest emergency room. .  Reduce your risk of vascular disease:  . Stop smoking. If you would like help call [facility] at 1-800-QUIT-NOW (1-800-784-8669) or [facility] at [phone]. . Manage your cholesterol . Maintain a desired weight . Control your diabetes . Keep your blood pressure down .  If you have any questions, please call the office at [phone]. 

## 2020-03-08 NOTE — Anesthesia Postprocedure Evaluation (Signed)
Anesthesia Post Note  Patient: Joshua Hess  Procedure(s) Performed: ENDARTERECTOMY CAROTID (Left Neck)     Patient location during evaluation: PACU Anesthesia Type: General Level of consciousness: sedated and patient cooperative Pain management: pain level controlled Vital Signs Assessment: post-procedure vital signs reviewed and stable Respiratory status: spontaneous breathing Cardiovascular status: stable Anesthetic complications: no   No complications documented.  Last Vitals:  Vitals:   03/08/20 0354 03/08/20 1040  BP: 106/65   Pulse: (!) 48   Resp: 17   Temp: 36.4 C (!) 36.1 C  SpO2: 97%     Last Pain:  Vitals:   03/08/20 1110  TempSrc:   PainSc: [place]

## 2020-03-08 NOTE — Progress Notes (Signed)
  Progress Note    03/08/2020 3:55 PM Day of Surgery  Subjective:  Surgical site soreness and throat sore but says its improving. Up in chair   Vitals:   03/08/20 1439 03/08/20 1441  BP:  108/60  Pulse:    Resp:    Temp:    SpO2: 97%    Physical Exam: Cardiac:  regular Lungs: non labored Incisions: left neck incision clean dry and intact without swelling or hematoma Extremities: moving all extremities without deficits Neurologic: cn intact. Smile symmetric. Tongue midline. Speech coherent CBC    Component Value Date/Time   WBC 8.0 03/08/2020 1312   RBC 3.92 (L) 03/08/2020 1312   HGB 13.2 03/08/2020 1312   HGB 14.2 09/26/2018 1142   HCT 38.1 (L) 03/08/2020 1312   HCT 40.4 09/26/2018 1142   PLT 159 03/08/2020 1312   PLT 179 09/26/2018 1142   MCV 97.2 03/08/2020 1312   MCV 96 09/26/2018 1142   MCH 33.7 03/08/2020 1312   MCHC 34.6 03/08/2020 1312   RDW 11.9 03/08/2020 1312   RDW 11.8 09/26/2018 1142   LYMPHSABS 2.4 03/08/2020 0347   MONOABS 0.8 03/08/2020 0347   EOSABS 0.2 03/08/2020 0347   BASOSABS 0.0 03/08/2020 0347    BMET    Component Value Date/Time   NA 139 03/08/2020 0347   NA 142 09/26/2018 1142   K 4.3 03/08/2020 0347   CL 104 03/08/2020 0347   CO2 25 03/08/2020 0347   GLUCOSE 91 03/08/2020 0347   BUN 18 03/08/2020 0347   BUN 25 09/26/2018 1142   CREATININE 1.15 03/08/2020 1312   CREATININE 0.91 11/01/2012 1209   CALCIUM 8.9 03/08/2020 0347   GFRNONAA >60 03/08/2020 1312   GFRAA >60 03/08/2020 1312    INR    Component Value Date/Time   INR 1.1 03/05/2020 1209     Intake/Output Summary (Last 24 hours) at 03/08/2020 1555 Last data filed at 03/08/2020 1055 Gross per 24 hour  Intake 1520 ml  Output 900 ml  Net 620 ml     Assessment/Plan:  79 y.o. male is s/p left carotid endarterectomy Day of Surgery. Doing well post op. Incision c/d/i without hematoma. Neurologically intact. Bp soft.  Tolerated ambulating with mobility specialist. PT/  OT to eval tomorrow. Advance diet as tolerated    Karoline Caldwell, PA-C [facility] [phone] 03/08/2020 3:55 PM

## 2020-03-08 NOTE — Progress Notes (Signed)
PROGRESS NOTE    Joshua Hess  TIW:[id number] DOB: [birth year] DOA: 03/05/2020 PCP: Joshua Pretty, MD   Brief Narrative:  HPI per Joshua Hess Joshua Hess is a 79 y.o. male with medical history significant for severe AS s/p tissue AVR April 2020, mild nonobstructive CAD, sinus bradycardia, carotid artery stenosis (8/12 carotid Dopplers show left ICA 40-59%, right ICA 1-39%), BPH, and hard of hearing who presented to [facility] for evaluation of new onset right facial droop.  History is supplemented by patient's partner at bedside.  Patient was in his usual state of health with [facility] Hess approximately 9 PM 03/04/2020.  This morning he woke up and ran 3 miles per his usual daily activities.  When he returned home his partner noticed significant right facial droop.  Patient had associated lightheadedness but denied any other symptoms including dysarthria, nausea, vomiting, numbness/tingling or change in sensation, focal weakness, change in vision, gait instability, chest pain, palpitations, dyspnea, abdominal pain, diarrhea or constipation, or dysuria.  He was taken to [facility] for further evaluation.  Patient also reports several months of lightheadedness occurring shortly after positional changes.  He has been having feelings of anxiousness and his primary care physician has adjusted his medications with recent initiation of sertraline and as needed diazepam.  Patient has known carotid artery stenosis with carotid Dopplers 8/12 showing left ICA 40-59% stenosis in right ICA 1-39% stenosis.  He has been taking aspirin 81 and mg rosuvastatin 10 mg daily.  He is currently in the process of staged dental implants through Joshua [facility].  Context.  He says he does take 2000 mg amoxicillin prior to dental procedures.  [facility] Course:  Initial vitals showed BP 120/66, pulse 56, RR 14, temp 98.0 Fahrenheit, SPO2 98% on room air.  Labs showed WBC 6.6, hemoglobin 14.0, platelets  158,000, sodium 139, potassium 4.3, bicarb 27, BUN 20, creatinine 1.01, serum glucose 108, LFTs within normal limits.  Serum ethanol level <10.  UDS positive for benzodiazepines.  Urinalysis negative for UTI.  SARS-CoV-2 PCR is negative.  CT head without contrast is negative for acute intracranial abnormality.  Teleneurology were consulted and recommended initiating Plavix 75 mg daily with ASA 81 mg DAPT x21 days.  Medical admission for further stroke work-up with MRI head, MRA head/neck, TTE was recommended.  Patient was given 300 mg Plavix load and aspirin 324 mg.  The hospitalist service was consulted for further evaluation and management.  Review of Systems: All systems reviewed and are negative except as documented in history of present illness above.  **Interim History Cardiology, neurology and vascular surgery involved in the patient's care.  Cardiology felt the patient was acceptable risk for preoperative clearance and vascular surgery discussed the options of carotid endarterectomy versus a TCAR and patient is to discuss this further before making a decision.  Neurology recommending dual antiplatelet therapy.  Patient has decided to elect for the Left carotid endarterectomy and underwent the procedure today.   Assessment & Plan:   Principal Problem:   Facial droop Active Problems:   Benign prostatic hyperplasia with nocturia   Sinus bradycardia   Carotid artery stenosis   S/P AVR (aortic valve replacement)   Mild CAD  Acute Right Facial Droop -Admitted for stroke evaluation.   -CT head is negative.   -Teleneurology was consulted and patient has been loaded with Plavix 300 mg and aspirin 324 mg.  -He takes aspirin 81 mg and rosuvastatin 10 mg daily  as an outpatient.  Discussed with on-call neurology, Dr. Rory Hess who recommends obtaining CTA head and neck instead of MRI at this time. -MRI brain showed "No acute intracranial abnormality. Generalized age-related cerebral atrophy  with mild chronic small vessel ischemic disease." -CTA head and neck showed "Negative CTA for emergent large vessel occlusion. 75% atheromatous stenosis about the left carotid bifurcation. 50% stenosis at the origin of the right subclavian artery. Moderate approximate 50% stenosis at the origin of the left vertebral artery. Additional mild scattered atherosclerotic change elsewhere about the major arterial vasculature of the head and neck as above. No other hemodynamically significant or correctable stenosis." -Echocardiogram done and showed EF of 50-55% with mild concentric LVH and Paradoxical Spetal motion and Grade 1 DD  -Monitor on telemetry, continue neurochecks  -Neurology was formally consulted and felt that he could have a had a TIA versus a small stroke not seen on MRI -Continue with DAPT aspirin 81 mg daily Plavix 75 mg daily beginning tomorrow -Increased home rosuvastatin to 20 mg daily -Allow permissive hypertension up to 220/110 -Check A1c and was 5.2, lipid panel done and showed a total cholesterol/HDL ratio 2.3, cholesterol level 109, HDL 47, LDL 52, triglycerides of 49, VLDL 10 -PT/OT/SLP eval; PT and OT recommend no F/U and SLP recommending outpatient SLP  -Neurology to follow, appreciate assistance and they recommended obtaining a vascular surgery consultation as they feel the patient had symptomatic left ICA stenosis and wanted help with vascular surgery opinion for a CEA -Cardiology was consulted for preoperative clearance in case she does need a CEA and he has an acceptable risk -Vascular surgery evaluating and recommending either CEA for reduction of stroke risk versus TCAR and patient is to discuss this further prior to making decision.  If he does go for surgery it can be done as inpatient or he can be discharged with a brief interval and readmitted for endarterectomy but the timing will be left to the vascular surgery team -Patient has elected for the left carotid endarterectomy  and this was done today;  -Patient has had a history of a TIA in the past and he follows with Dr. Krista Hess at [facility]  Orthostatic hypotension -Check orthostatic vitals; repeat was not done but he was orthostatic on admission on 03/06/2020 when his blood pressure lying went from 155/75 to standing 108/76 -Hold home finasteride for now -He was getting IV fluid hydration and now stopped now.  Carotid artery stenosis: -Follows with vascular surgery, Dr. Oneida Alar. 8/12 carotid Dopplers show left ICA stenosis 40-59%, right ICA 1-39%.   -Follow-up MRI studies as above.   -Continue dual antiplatelet therapy with aspirin and Plavix; rosuvastatin as above. -CTA of the Head and Neck showed "Negative CTA for emergent large vessel occlusion. 75% atheromatous stenosis about the left carotid bifurcation. 50% stenosis at the origin of the right subclavian artery. Moderate approximate 50% stenosis at the origin of the left vertebral artery. Additional mild scattered atherosclerotic change elsewhere about the major arterial vasculature of the head and neck as above. No other hemodynamically significant or correctable stenosis." -Patient to underwenta left carotid endarterectomy today and is POD0  Sinus Bradycardia -Chronic and at patient's baseline.  He is an avid Production designer, theatre/television/film. -HR was 55 this AM post-op  Mild CAD Severe AS s/p tissue AVR: -Chronic and stable.  Follows with cardiology, Dr. Angelena Form cardiothoracic surgery Dr. Cyndia Bent. -Continue Aspirin, Plavix, rosuvastatin as above  BPH: -Was Holding home finasteride for now as above.  Depression/Anxiety: -Continue sertraline. -Resumed  Home Valium and Trazodone   DVT prophylaxis: Enoxaparin 40 mg sq q24h Code Status: DO NOT RESUSCITATE  Family Communication: Spoke to Husband at bedside  Disposition Plan: Pending further neurological clearance as well as vascular clearance and will need to check orthostatic vital signs prior to  discharge; patient to undergo a left carotid endarterectomy in the morning  Status is: Inpatient  Remains inpatient appropriate because:Ongoing diagnostic testing needed not appropriate for outpatient work up, Unsafe d/Hess plan and Inpatient level of care appropriate due to severity of illness   Dispo: The patient is from: Home              Anticipated d/Hess is to: Home              Anticipated d/Hess date is: 1 day              Patient currently is not medically stable to d/Hess.   Consultants:   Neurology  Vascular Surgery  Cardiology    Procedures:  ECHOCARDIOGRAM IMPRESSIONS    1. Left ventricular ejection fraction, by estimation, is 50 to 55%. The  left ventricle has low normal function. The left ventricle has no regional  wall motion abnormalities. There is mild concentric left ventricular  hypertrophy. Left ventricular  diastolic parameters are consistent with Grade I diastolic dysfunction  (impaired relaxation).  2. Right ventricular systolic function is low normal. The right  ventricular size is mildly enlarged. There is normal pulmonary artery  systolic pressure. The estimated right ventricular systolic pressure is  16.1 mmHg.  3. Left atrial size was mild to moderately dilated.  4. Right atrial size was mildly dilated.  5. The mitral valve is grossly normal. Mild mitral valve regurgitation.  No evidence of mitral stenosis.  6. 23 mm surgical prosthetic valve present with V max 2.8 m/s, MG 17  mmHG, EOA 1.40 cm2. The aortic valve has been repaired/replaced. Aortic  valve regurgitation is not visualized. No aortic stenosis is present.  There is a 23 mm bioprosthetic valve  present in the aortic position. Procedure Date: 11/10/2018. Echo findings  are consistent with normal structure and function of the aortic valve  prosthesis.  7. The inferior vena cava is normal in size with greater than 50%  respiratory variability, suggesting right atrial pressure of 3 mmHg.     Comparison(s): No significant change from prior study.   Conclusion(s)/Recommendation(s): No intracardiac source of embolism  detected on this transthoracic study. A transesophageal echocardiogram is  recommended to exclude cardiac source of embolism if clinically indicated.   FINDINGS  Left Ventricle: Left ventricular ejection fraction, by estimation, is 50  to 55%. The left ventricle has low normal function. The left ventricle has  no regional wall motion abnormalities. The left ventricular internal  cavity size was normal in size.  There is mild concentric left ventricular hypertrophy. Abnormal  (paradoxical) septal motion consistent with post-operative status. Left  ventricular diastolic parameters are consistent with Grade I diastolic  dysfunction (impaired relaxation).   Right Ventricle: The right ventricular size is mildly enlarged. No  increase in right ventricular wall thickness. Right ventricular systolic  function is low normal. There is normal pulmonary artery systolic  pressure. The tricuspid regurgitant velocity is  2.14 m/s, and with an assumed right atrial pressure of 3 mmHg, the  estimated right ventricular systolic pressure is 09.6 mmHg.   Left Atrium: Left atrial size was mild to moderately dilated.   Right Atrium: Right atrial size was mildly dilated.  Pericardium: There is no evidence of pericardial effusion.   Mitral Valve: The mitral valve is grossly normal. Mild mitral valve  regurgitation. No evidence of mitral valve stenosis.   Tricuspid Valve: The tricuspid valve is grossly normal. Tricuspid valve  regurgitation is mild . No evidence of tricuspid stenosis.   Aortic Valve: 23 mm surgical prosthetic valve present with V max 2.8 m/s,  MG 17 mmHG, EOA 1.40 cm2. The aortic valve has been repaired/replaced.  Aortic valve regurgitation is not visualized. No aortic stenosis is  present. Aortic valve mean gradient  measures 17.0 mmHg. Aortic valve peak  gradient measures 32.7 mmHg. Aortic  valve area, by VTI measures 1.40 cm. There is a 23 mm bioprosthetic valve  present in the aortic position. Procedure Date: 11/10/2018. Echo findings  are consistent with normal  structure and function of the aortic valve prosthesis.   Pulmonic Valve: The pulmonic valve was grossly normal. Pulmonic valve  regurgitation is not visualized. No evidence of pulmonic stenosis.   Aorta: The aortic root is normal in size and structure.   Venous: The inferior vena cava is normal in size with greater than 50%  respiratory variability, suggesting right atrial pressure of 3 mmHg.   IAS/Shunts: The atrial septum is grossly normal.   EKG: Rhythm strip during this exam demostrated sinus bradycardia and  premature ventricular contractions.     LEFT VENTRICLE  PLAX 2D  LVIDd:     5.04 cm Diastology  LVIDs:     3.65 cm LV e' lateral:  6.20 cm/s  LV PW:     1.18 cm LV E/e' lateral: 10.0  LV IVS:    1.21 cm LV e' medial:  4.24 cm/s  LVOT diam:   2.20 cm LV E/e' medial: 14.6  LV SV:     89  LV SV Index:  46  LVOT Area:   3.80 cm     RIGHT VENTRICLE  RV S prime:   11.50 cm/s  TAPSE (M-mode): 1.3 cm   LEFT ATRIUM       Index    RIGHT ATRIUM      Index  LA diam:    4.60 cm 2.38 cm/m RA Area:   27.80 cm  LA Vol (A2C):  128.0 ml 66.21 ml/m RA Volume:  93.60 ml 48.41 ml/m  LA Vol (A4C):  68.6 ml 35.48 ml/m  LA Biplane Vol: 97.5 ml 50.43 ml/m  AORTIC VALVE  AV Area (Vmax):  1.27 cm  AV Area (Vmean):  1.40 cm  AV Area (VTI):   1.40 cm  AV Vmax:      286.00 cm/s  AV Vmean:     194.000 cm/s  AV VTI:      0.634 m  AV Peak Grad:   32.7 mmHg  AV Mean Grad:   17.0 mmHg  LVOT Vmax:     95.80 cm/s  LVOT Vmean:    71.500 cm/s  LVOT VTI:     0.233 m  LVOT/AV VTI ratio: 0.37    AORTA  Ao Root diam: 3.50 cm   MITRAL VALVE        TRICUSPID  VALVE  MV Area (PHT): 1.48 cm  TR Peak grad:  18.3 mmHg  MV Decel Time: 511 msec  TR Vmax:    214.00 cm/s  MV E velocity: 61.70 cm/s  MV A velocity: 57.00 cm/s SHUNTS  MV E/A ratio: 1.08    Systemic VTI: 0.23 m  Systemic Diam: 2.20 cm   Antimicrobials:  Anti-infectives (From admission, onward)   Start     Dose/Rate Route Frequency Ordered Stop   03/08/20 0600  [MAR Hold]  ceFAZolin (ANCEF) IVPB 2g/100 mL premix        (MAR Hold since Fri 03/08/2020 at 0655.Hold Reason: Transfer to a Procedural area.)  Note to Pharmacy: Send with pt to OR   2 g 200 mL/hr over 30 Minutes Intravenous On call 03/07/20 1125 03/08/20 0829     Subjective: Seen and examined at bedside in the PACU and and he had just had his left carotid endarterectomy and was feeling okay.  Asking about some ice cream.  No nausea or vomiting.  No other concerns or complaints at this time.  Objective: Vitals:   03/08/20 0354 03/08/20 1040 03/08/20 1125 03/08/20 1140  BP: 106/65  99/63 (!) 92/58  Pulse: (!) 48  (!) 57 (!) 55  Resp: 17  (!) 8 14  Temp: 97.6 F (36.4 Hess) (!) 97 F (36.1 Hess)  98.1 F (36.7 Hess)  TempSrc: Oral     SpO2: 97%  98% 92%  Weight:      Height:        Intake/Output Summary (Last 24 hours) at 03/08/2020 1159 Last data filed at 03/08/2020 1055 Gross per 24 hour  Intake 1520 ml  Output 900 ml  Net 620 ml   Filed Weights   03/05/20 1144  Weight: 77.6 kg   Examination: Physical Exam:  Constitutional: WN/WD slightly overweight Caucasian male who is seen in the PACU appears calm and in no acute distress Eyes: Lids and conjunctivae normal, sclerae anicteric  ENMT: External Ears, Nose appear normal. Grossly normal hearing.  Neck: Appears normal, supple, no cervical masses, normal ROM, no appreciable thyromegaly; no JVD and has a fresh incision on the left side of his neck Respiratory: Diminished to auscultation bilaterally, no wheezing, rales, rhonchi or  crackles. Normal respiratory effort and patient is not tachypenic. No accessory muscle use.  Unlabored breathing Cardiovascular: Continues to have a bradycardic rate, and that they are 6 systolic murmur.. No extremity edema. 2+ pedal pulses. No carotid bruits.  Abdomen: Soft, non-tender, slightly distended. Bowel sounds positive.  GU: Deferred. Musculoskeletal: No clubbing / cyanosis of digits/nails. No joint deformity upper and lower extremities.  Has an arterial line in the left arm Skin: No rashes, lesions, ulcers. No induration; Warm and dry.  Neurologic: CN 2-12 grossly intact with no focal deficits. Sensation intact in all 4 Extremities, DTR normal.  Psychiatric: Normal judgment and insight. Alert and oriented x 3. Normal mood and appropriate affect.   Data Reviewed: I have personally reviewed following labs and imaging studies  CBC: Recent Labs  Lab 03/05/20 1209 03/06/20 0158 03/07/20 0346 03/08/20 0347  WBC 6.6 6.7 7.0 6.8  NEUTROABS 3.8  --  4.3 3.4  HGB 14.0 14.5 14.4 14.0  HCT 41.9 42.9 40.7 40.2  MCV 97.9 96.6 95.5 96.4  PLT 158 162 166 662   Basic Metabolic Panel: Recent Labs  Lab 03/05/20 1209 03/06/20 0158 03/07/20 0346 03/08/20 0347  NA 139 139 136 139  K 4.3 4.0 4.5 4.3  CL 103 104 101 104  CO2 27 28 27 25   GLUCOSE 108* 76 93 91  BUN 20 18 19 18   CREATININE 1.01 0.86 0.97 1.09  CALCIUM 8.7* 8.7* 9.0 8.9  MG  --   --  1.9 2.5*  PHOS  --   --  3.8 4.5  GFR: Estimated Creatinine Clearance: 55 mL/min (by Hess-G formula based on SCr of 1.09 mg/dL). Liver Function Tests: Recent Labs  Lab 03/05/20 1209 03/07/20 0346 03/08/20 0347  AST 22 21 18   ALT 21 19 16   ALKPHOS 45 48 43  BILITOT 0.6 1.1 0.8  PROT 6.5 6.2* 6.4*  ALBUMIN 3.7 3.5 3.4*   No results for input(s): LIPASE, AMYLASE in the last 168 hours. No results for input(s): AMMONIA in the last 168 hours. Coagulation Profile: Recent Labs  Lab 03/05/20 1209  INR 1.1   Cardiac Enzymes: No  results for input(s): CKTOTAL, CKMB, CKMBINDEX, TROPONINI in the last 168 hours. BNP (last 3 results) No results for input(s): PROBNP in the last 8760 hours. HbA1C: Recent Labs    03/06/20 0158  HGBA1C 5.2   CBG: No results for input(s): GLUCAP in the last 168 hours. Lipid Profile: Recent Labs    03/06/20 0158  CHOL 109  HDL 47  LDLCALC 52  TRIG 49  CHOLHDL 2.3   Thyroid Function Tests: Recent Labs    03/06/20 0158  TSH 3.435   Anemia Panel: No results for input(s): VITAMINB12, FOLATE, FERRITIN, TIBC, IRON, RETICCTPCT in the last 72 hours. Sepsis Labs: No results for input(s): PROCALCITON, LATICACIDVEN in the last 168 hours.  Recent Results (from the past 240 hour(s))  SARS Coronavirus 2 by RT PCR (Hess order, performed in [facility] Hess lab) Nasopharyngeal Nasopharyngeal Swab     Status: None   Collection Time: 03/05/20  2:50 PM   Specimen: Nasopharyngeal Swab  Result Value Ref Range Status   SARS Coronavirus 2 NEGATIVE NEGATIVE Final    Comment: (NOTE) SARS-CoV-2 target nucleic acids are NOT DETECTED.  The SARS-CoV-2 RNA is generally detectable in upper and lower respiratory specimens during the acute phase of infection. The lowest concentration of SARS-CoV-2 viral copies this assay can detect is 250 copies / mL. A negative result does not preclude SARS-CoV-2 infection and should not be used as the sole basis for treatment or other patient management decisions.  A negative result may occur with improper specimen collection / handling, submission of specimen other than nasopharyngeal swab, presence of viral mutation(s) within the areas targeted by this assay, and inadequate number of viral copies (<250 copies / mL). A negative result must be combined with clinical observations, patient history, and epidemiological information.  Fact Sheet for Patients:   StrictlyIdeas.no  Fact Sheet for Healthcare  Providers: BankingDealers.co.za  This test is not yet approved or  cleared by the [place] FDA and has been authorized for detection and/or diagnosis of SARS-CoV-2 by FDA under an Emergency Use Authorization (EUA).  This EUA will remain in effect (meaning this test can be used) for the duration of the COVID-19 declaration under Section 564(b)(1) of the Act, 21 U.[place]. section 360bbb-3(b)(1), unless the authorization is terminated or revoked sooner.  Performed at [facility], [address] [place], [place] [address]   MRSA PCR Screening     Status: None   Collection Time: 03/07/20  7:42 PM   Specimen: Nasopharyngeal  Result Value Ref Range Status   MRSA by PCR NEGATIVE NEGATIVE Final    Comment:        The GeneXpert MRSA Assay (FDA approved for NASAL specimens only), is one component of a comprehensive MRSA colonization surveillance program. It is not intended to diagnose MRSA infection nor to guide or monitor treatment for MRSA infections. Performed at [facility], [place] [address],  [place], [place] [address]      RN Pressure Injury Documentation:     Estimated body mass index is 25.26 kg/m as calculated from the following:   Height as of this encounter: 5\' 9"  (1.753 m).   Weight as of this encounter: 77.6 kg.  Malnutrition Type:      Malnutrition Characteristics:      Nutrition Interventions:    Radiology Studies: No results found. Scheduled Meds: . [MAR Hold] aspirin EC  81 mg Oral Daily  . [MAR Hold] clopidogrel  75 mg Oral Daily  . [MAR Hold] rosuvastatin  20 mg Oral Daily  . [MAR Hold] sertraline  100 mg Oral Daily  . [MAR Hold] traZODone  50-100 mg Oral QHS   Continuous Infusions: . sodium chloride      LOS: 2 days   Kerney Elbe, DO [facility] PAGER is on [place]  If 7PM-7AM, please contact night-coverage [email or web address]

## 2020-03-08 NOTE — Progress Notes (Signed)
Patient is very anxious and irritable, striking at this RN as I transferred patient from chair to bed - patient wants only his spouse to assist with transfer. Educated the patient on the need to ensure his safety while transferring with multiple IVs  And central line. Patient's spouse apologized for patient and going forward assured this RN that he can safely transfer patient.  Patient also refuses any further assessments and requesting no staff to enter room during the night unless requested via call bell. Will continue to monitor.

## 2020-03-08 NOTE — Progress Notes (Signed)
Patient arrived from PACU. Patient a/o but flat affect.  CHG wipe down, vss, CCMD called and patient oriented to unit as able.  Patient's partner arrived and aware of POC. Pt resting with call bell within reach.  Will continue to monitor.

## 2020-03-08 NOTE — Interval H&P Note (Signed)
History and Physical Interval Note:  03/08/2020 7:18 AM  Joshua Hess  has presented today for surgery, with the diagnosis of CAROTID ARTERY STENOSIS.  The various methods of treatment have been discussed with the patient and family. After consideration of risks, benefits and other options for treatment, the patient has consented to  Procedure(s): ENDARTERECTOMY CAROTID (Left) as a surgical intervention.  The patient's history has been reviewed, patient examined, no change in status, stable for surgery.  I have reviewed the patient's chart and labs.  Questions were answered to the patient's satisfaction.     Curt Jews

## 2020-03-08 NOTE — Anesthesia Procedure Notes (Signed)
Procedure Name: Intubation Date/Time: 03/08/2020 7:51 AM Performed by: Genelle Bal, CRNA Pre-anesthesia Checklist: Patient identified, Emergency Drugs available, Suction available and Patient being monitored Patient Re-evaluated:Patient Re-evaluated prior to induction Oxygen Delivery Method: Circle system utilized Preoxygenation: Pre-oxygenation with 100% oxygen Induction Type: IV induction Ventilation: Mask ventilation without difficulty and Oral airway inserted - appropriate to patient size Laryngoscope Size: Sabra Heck and 2 Grade View: Grade I Tube type: Oral Tube size: 7.5 mm Number of attempts: 1 Airway Equipment and Method: Stylet and Oral airway Placement Confirmation: ETT inserted through vocal cords under direct vision,  positive ETCO2 and breath sounds checked- equal and bilateral Secured at: 23 cm Tube secured with: Tape Dental Injury: Teeth and Oropharynx as per pre-operative assessment

## 2020-03-08 NOTE — Op Note (Signed)
   OPERATIVE REPORT  DATE OF SURGERY: 03/08/2020  PATIENT: Joshua Hess, 79 y.o. male MRN: [id number]  DOB: 1941-07-04  PRE-OPERATIVE DIAGNOSIS: Left Carotid Stenosis, Symptomatic  POST-OPERATIVE DIAGNOSIS:  Same  PROCEDURE:  Left Carotid Endarterectomy with Dacron Patch Angioplasty  SURGEON:  Curt Jews, M.D.  PHYSICIAN ASSISTANT: Baglia  The assistant was needed for exposure and to expedite the case  ANESTHESIA:   general  EBL: Less than 200 ml  Total I/O In: 1400 [I.V.:1400] Out: 900 [Urine:700; Blood:200]  BLOOD ADMINISTERED: none  DRAINS: none   SPECIMEN: none  COUNTS CORRECT:  YES  PLAN OF CARE: Admit to inpatient   PATIENT DISPOSITION:  PACU - hemodynamically stable and neurologically intact.  PROCEDURE DETAILS: The patient was taken to the operating room placed in supine position.  General anesthesia was administered.  The neck was prepped and draped in the usual sterile fashion.  An incision was made anterior to the sternocleidomastoid and carried down through the platysma with electrocautery.  The sternocleidomastoid was reflected posteriorly and the carotid sheath was opened.  The facial vein was ligated with 2-0 silk ties and divided.  The common carotid artery was encircled with an umbilical tape and Rummel tourniquet.  The vagus nerve was identified and preserved.  Dissection was continued onto the carotid bifurcation.  The superior thyroid artery was encircled with a 2-0 silk Potts tie.  The external carotid was encircled with a blue vessel loop and the internal carotid was encircled with an umbilical tape and Rummel tourniquet.  The hypoglossal nerve was identified and preserved.  The patient was given systemic heparin and after adequate circulation time, the internal, external and common carotid arteries were occluded with vascular clamps.  The common carotid artery was opened with an 11 blade and extended  longitudinally with Potts scissors.  A 10 shunt was  passed up the internal carotid and allowed to backbleed.  It was then passed down the common carotid where it was secured with Rummel tourniquet.  The endarterectomy was begun on the common carotid artery and the plaque was divided proximally with Potts scissors.  The endarterectomy was continued onto the bifurcation.  The external carotid was endarterectomized with an eversion technique and the internal carotid was endarterectomized in an open fashion.  Remaining atheromatous debris was removed from the endarterectomy plane.  A Finesse Hemashield Dacron patch was brought onto the field and was sewn as a patch angioplasty with a running 6-0 Prolene suture.  Prior to completion of the closure the shunt was removed and the usual flushing maneuvers were undertaken.  The anastomosis was completed and flow was restored first to the external and then the internal carotid artery.  Excellent flow characteristics were noted with hand-held Doppler in the internal and external carotid arteries.  The patient was given 50 mg of protamine to reverse the heparin.  The wounds were irrigated with saline.  Hemostasis was obtained with electrocautery.  The wounds were closed with 3-0 Vicryl to reapproximate the sternocleidomastoid over the carotid sheath.  The platysma was lysed with a running 3-0 Vicryl suture.  The skin was closed with a 4-0 subcuticular Vicryl stitch.  Dermabond was applied.  The patient was awakened neurologically intact in the operating room and transferred to the recovery room in stable condition   Curt Jews, M.D. 03/08/2020 3:47 PM

## 2020-03-09 LAB — COMPREHENSIVE METABOLIC PANEL
ALT: 14 U/L (ref 0–44)
AST: 16 U/L (ref 15–41)
Albumin: 2.9 g/dL — ABNORMAL LOW (ref 3.5–5.0)
Alkaline Phosphatase: 40 U/L (ref 38–126)
Anion gap: 10 (ref 5–15)
BUN: 21 mg/dL (ref 8–23)
CO2: 25 mmol/L (ref 22–32)
Calcium: 8.4 mg/dL — ABNORMAL LOW (ref 8.9–10.3)
Chloride: 105 mmol/L (ref 98–111)
Creatinine, Ser: 0.78 mg/dL (ref 0.61–1.24)
GFR calc Af Amer: >60 mL/min (ref >60)
GFR calc non Af Amer: >60 mL/min (ref >60)
Glucose, Bld: 109 mg/dL — ABNORMAL HIGH (ref 70–99)
Potassium: 3.9 mmol/L (ref 3.5–5.1)
Sodium: 140 mmol/L (ref 135–145)
Total Bilirubin: 0.6 mg/dL (ref 0.3–1.2)
Total Protein: 5.2 g/dL — ABNORMAL LOW (ref 6.5–8.1)

## 2020-03-09 LAB — CBC WITH DIFFERENTIAL/PLATELET
Abs Immature Granulocytes: 0.04 10*3/uL (ref 0.00–0.07)
Basophils Absolute: 0 10*3/uL (ref 0.0–0.1)
Basophils Relative: 0 %
Eosinophils Absolute: 0 10*3/uL (ref 0.0–0.5)
Eosinophils Relative: 0 %
HCT: 33.3 % — ABNORMAL LOW (ref 39.0–52.0)
Hemoglobin: 11.3 g/dL — ABNORMAL LOW (ref 13.0–17.0)
Immature Granulocytes: 0 %
Lymphocytes Relative: 20 %
Lymphs Abs: 2 10*3/uL (ref 0.7–4.0)
MCH: 32.8 pg (ref 26.0–34.0)
MCHC: 33.9 g/dL (ref 30.0–36.0)
MCV: 96.8 fL (ref 80.0–100.0)
Monocytes Absolute: 1 10*3/uL (ref 0.1–1.0)
Monocytes Relative: 10 %
Neutro Abs: 6.9 10*3/uL (ref 1.7–7.7)
Neutrophils Relative %: 70 %
Platelets: 125 10*3/uL — ABNORMAL LOW (ref 150–400)
RBC: 3.44 MIL/uL — ABNORMAL LOW (ref 4.22–5.81)
RDW: 12 % (ref 11.5–15.5)
WBC: 9.9 10*3/uL (ref 4.0–10.5)
nRBC: 0 % (ref 0.0–0.2)

## 2020-03-09 LAB — MAGNESIUM: Magnesium: 1.9 mg/dL (ref 1.7–2.4)

## 2020-03-09 LAB — PHOSPHORUS: Phosphorus: 3.8 mg/dL (ref 2.5–4.6)

## 2020-03-09 MED ORDER — OXYCODONE-ACETAMINOPHEN 5-325 MG PO TABS: 1.0000 | ORAL_TABLET | ORAL | 0 refills | Status: DC | PRN

## 2020-03-09 MED ORDER — ROSUVASTATIN CALCIUM 20 MG PO TABS
20.0000 mg | ORAL_TABLET | Freq: Every day | ORAL | 0 refills | Status: DC
Start: 2020-03-10 — End: 2020-04-25

## 2020-03-09 MED ORDER — PANTOPRAZOLE SODIUM 40 MG PO TBEC
40.0000 mg | DELAYED_RELEASE_TABLET | Freq: Every day | ORAL | 0 refills | Status: DC
Start: 2020-03-10 — End: 2020-04-25

## 2020-03-09 MED ORDER — SENNOSIDES-DOCUSATE SODIUM 8.6-50 MG PO TABS: 1.0000 | ORAL_TABLET | Freq: Every evening | ORAL | 0 refills | Status: DC | PRN

## 2020-03-09 MED ORDER — CLOPIDOGREL BISULFATE 75 MG PO TABS
75.0000 mg | ORAL_TABLET | Freq: Every day | ORAL | 0 refills | Status: DC
Start: 2020-03-09 — End: 2020-04-02

## 2020-03-09 MED ORDER — ASPIRIN 81 MG PO TBEC: 81.0000 mg | DELAYED_RELEASE_TABLET | Freq: Every day | ORAL | 0 refills | Status: AC

## 2020-03-09 NOTE — Progress Notes (Addendum)
  Progress Note    03/09/2020 7:58 AM 1 Day Post-Op  Subjective:  Patient did not sleep well due to outside loud noises so had a rough night. He Is having some neck pain. States if he doesn't support his head with his arm it feels like he is straining his muscles in neck. Tolerated eating last night. Voiding without difficulty. Denies any trouble swallowing, speaking, or any weakness or numbness of extremities   Vitals:   03/09/20 0632 03/09/20 0641  BP: (!) 217/189 108/68  Pulse: (!) 46 (!) 53  Resp:  18  Temp:  (!) 96.6 F (35.9 C)  SpO2: 99% 100%   Physical Exam: Cardiac:  regular Lungs: non labored Incisions: left neck incision clean, dry and intact. No swelling or hematoma Extremities: moving all extremities without deficits Neurologic: CN intact. Does have some residual right sided facial droop and tongue deviation. Speech is coherent. Motor and sensation is intact  CBC    Component Value Date/Time   WBC 9.9 03/09/2020 0706   RBC 3.44 (L) 03/09/2020 0706   HGB 11.3 (L) 03/09/2020 0706   HGB 14.2 09/26/2018 1142   HCT 33.3 (L) 03/09/2020 0706   HCT 40.4 09/26/2018 1142   PLT 125 (L) 03/09/2020 0706   PLT 179 09/26/2018 1142   MCV 96.8 03/09/2020 0706   MCV 96 09/26/2018 1142   MCH 32.8 03/09/2020 0706   MCHC 33.9 03/09/2020 0706   RDW 12.0 03/09/2020 0706   RDW 11.8 09/26/2018 1142   LYMPHSABS 2.0 03/09/2020 0706   MONOABS 1.0 03/09/2020 0706   EOSABS 0.0 03/09/2020 0706   BASOSABS 0.0 03/09/2020 0706    BMET    Component Value Date/Time   NA 139 03/08/2020 0347   NA 142 09/26/2018 1142   K 4.3 03/08/2020 0347   CL 104 03/08/2020 0347   CO2 25 03/08/2020 0347   GLUCOSE 91 03/08/2020 0347   BUN 18 03/08/2020 0347   BUN 25 09/26/2018 1142   CREATININE 1.15 03/08/2020 1312   CREATININE 0.91 11/01/2012 1209   CALCIUM 8.9 03/08/2020 0347   GFRNONAA >60 03/08/2020 1312   GFRAA >60 03/08/2020 1312    INR    Component Value Date/Time   INR 1.1  03/05/2020 1209     Intake/Output Summary (Last 24 hours) at 03/09/2020 0758 Last data filed at 03/08/2020 1726 Gross per 24 hour  Intake 1400 ml  Output 1100 ml  Net 300 ml     Assessment/Plan:  79 y.o. male is s/p left CEA 1 Day Post-Op. Neurologically intact. Left neck incision c/d/i without swelling. Continue OOB and mobilize today. PT eval today. Doing well from vascular standpoint for discharge home pending PT recommendations. He will go home on Aspirin, Statin and Plavix. Follow up has been arranged for 04/01/20  DVT prophylaxis:  lovenox   Karoline Caldwell, PA-C [facility] [phone] 03/09/2020 7:58 AM   I have independently interviewed and examined patient and agree with PA assessment and plan above. Progressing as expected from left CEA. Reynolds for d/c from surgical standpoint.   Kingston Guiles C. Donzetta Matters, MD [facility] [place] Office: [phone] Pager: [phone]

## 2020-03-09 NOTE — Progress Notes (Signed)
Physical Therapy Treatment Patient Details Name: Joshua Hess MRN: [id number] DOB: 1940/10/06 Today's Date: 03/09/2020    History of Present Illness Patient is a 79 y/o male with PMH of severe AS s/p tissue AVR April 2020, mild nonobstructive CAD, sinus bradycardia, CAD, HOH presenting to [facility] for evaluation of new onset R facial droop. CT negative for acute intracranial abnormality. s/p L CEA on 8/72/21.    PT Comments    Pt supine in bed.  Painful from neck incision.  Reviewed postural exercises to help with soreness but too sore to participate.  Focused on LE strengthening and gt training.  Overall moving well and informed partner to ambulate in halls with patient as needed when he feels up to it.  Informed nursing he is safe to move around with his partner.    Follow Up Recommendations  No PT follow up;Supervision - Intermittent     Equipment Recommendations  None recommended by PT    Recommendations for Other Services       Precautions / Restrictions Precautions Precautions: Fall Precaution Comments: orthostatic BP but asymptomatic Restrictions Weight Bearing Restrictions: No    Mobility  Bed Mobility Overal bed mobility: Independent                Transfers Overall transfer level: Modified independent   Transfers: Sit to/from Stand Sit to Stand: Modified independent (Device/Increase time)            Ambulation/Gait Ambulation/Gait assistance: Supervision Gait Distance (Feet): 150 Feet Assistive device: None Gait Pattern/deviations: Step-through pattern     General Gait Details: safe, steady, age appropriate speeds. mild instability with cues for reciprocal armswing to improve balance.   Stairs             Wheelchair Mobility    Modified Rankin (Stroke Patients Only)       Balance Overall balance assessment: Needs assistance Sitting-balance support: No upper extremity supported;Feet supported Sitting balance-Leahy  Scale: Good       Standing balance-Leahy Scale: Good                              Cognition Arousal/Alertness: Awake/alert Behavior During Therapy: WFL for tasks assessed/performed Overall Cognitive Status: History of cognitive impairments - at baseline Area of Impairment: Memory;Following commands;Problem solving;Awareness;Safety/judgement                     Memory: Decreased short-term memory Following Commands: Follows one step commands with increased time Safety/Judgement: Decreased awareness of deficits Awareness: Emergent Problem Solving: Slow processing;Difficulty sequencing;Requires verbal cues;Decreased initiation General Comments: Pill box test administered, unable to complete as pt would read the direction, then attempt to place in pill box, and require assist from therapist in order to complete, despite acknowledging medication needed to be done for the entire week, pt would place the medication in for one day,then move onto next medication and place in the next day slot      Exercises General Exercises - Lower Extremity Ankle Circles/Pumps: AROM;Both;20 reps;Supine Quad Sets: AROM;Both;10 reps;Supine Heel Slides: AROM;Both;10 reps;Supine Hip ABduction/ADduction: AROM;Both;10 reps;Supine Straight Leg Raises: AROM;Both;10 reps;Supine    General Comments        Pertinent Vitals/Pain Pain Assessment: Faces Pain Score: 6  Pain Location: L side of neck and pin point pain in L side of abdomen. Pain Descriptors / Indicators: Sharp;Discomfort Pain Intervention(s): Monitored during session;Repositioned    Home Living  Prior Function            PT Goals (current goals can now be found in the care plan section) Acute Rehab PT Goals Patient Stated Goal: to get home  Potential to Achieve Goals: Good Progress towards PT goals: Progressing toward goals    Frequency    Min 3X/week      PT Plan Current plan  remains appropriate    Co-evaluation              AM-PAC PT "6 Clicks" Mobility   Outcome Measure  Help needed turning from your back to your side while in a flat bed without using bedrails?: None Help needed moving from lying on your back to sitting on the side of a flat bed without using bedrails?: None Help needed moving to and from a bed to a chair (including a wheelchair)?: None Help needed standing up from a chair using your arms (e.g., wheelchair or bedside chair)?: None Help needed to walk in hospital room?: None Help needed climbing 3-5 steps with a railing? : None 6 Click Score: 24    End of Session Equipment Utilized During Treatment: Gait belt Activity Tolerance: Patient tolerated treatment well Patient left: with call bell/phone within reach;in chair;with family/visitor present Nurse Communication: Mobility status PT Visit Diagnosis: Unsteadiness on feet (R26.81)     Time: 6734-1937 PT Time Calculation (min) (ACUTE ONLY): 19 min  Charges:  $Gait Training: 8-22 mins                     Joshua Hess , PTA Acute Rehabilitation Services Pager [phone] Office [phone]     Joshua Hess Joshua Hess 03/09/2020, 12:42 PM

## 2020-03-09 NOTE — Discharge Summary (Signed)
Physician Discharge Summary  Joshua Hess:[id number] DOB: 1941-03-03 DOA: 03/05/2020  PCP: [name], MD  Admit date: 03/05/2020 Discharge date: 03/09/2020  Admitted From: Home Disposition:  Home with outpatient OT  Recommendations for Outpatient Follow-up:  1. Follow up with PCP in 1-2 weeks 2. Follow up with Neurology in 1-2 weeks 3. Follow up with Vascular Surgery within 1-2 weeks 4. Please obtain CMP/CBC, Mag, Phos in one week 5. Please follow up on the following pending results:  Home Health: No Equipment/Devices: None   Discharge Condition: Stable CODE STATUS: FULL CODE  Diet recommendation: Heart Healthy Diet   Brief/Interim Summary: HPI per Dr. Christella Joshua Hess a 79 y.o.malewith medical history significant forsevere AS s/p tissue AVR April 2020, mild nonobstructive CAD, sinus bradycardia, carotid artery stenosis (8/12 carotid Dopplers show left ICA 40-59%, right ICA 1-39%), BPH, and hard of hearing who presented to [place] ED for evaluation of new onset right facial droop.History is supplemented by patient's partner at bedside.  Patient was in his usual state of health with LPN approximately 9 PM 03/04/2020. This morning he woke up and ran 3 miles per his usual daily activities. When he returned home his partner noticed significant right facial droop. Patient had associated lightheadedness but denied any other symptoms including dysarthria, nausea, vomiting, numbness/tingling or change in sensation, focal weakness, change in vision, gait instability, chest pain, palpitations, dyspnea, abdominal pain, diarrhea or constipation, or dysuria. He was taken to [facility] for further evaluation.  Patient also reports several months of lightheadedness occurring shortly after positional changes. He has been having feelings of anxiousness and his primary care physician has adjusted his medications with recent initiation of sertraline  and as needed diazepam.  Patient has known carotid artery stenosis with carotid Dopplers 8/12 showing left ICA 40-59% stenosis in right ICA 1-39% stenosis. He has been taking aspirin 81 and mg rosuvastatin 10 mg daily.  He is currently in the process of stageddental [facility] Context. He says he does take 2000 mg amoxicillin prior to dental procedures.  [facility] Course: Initial vitals showed BP 120/66, pulse 56, RR 14, temp 98.0 Fahrenheit, SPO2 98% on room air.  Labs showed WBC 6.6, hemoglobin 14.0, platelets 158,000, sodium 139, potassium 4.3, bicarb 27, BUN 20, creatinine 1.01, serum glucose 108, LFTs within normal limits. Serum ethanol level <10. UDS positive for benzodiazepines. Urinalysis negative for UTI.  SARS-CoV-2 PCR is negative.  CT head without contrast is negative for acute intracranial abnormality.  Teleneurology were consulted and recommended initiating Plavix 75 mg daily with ASA 81 mg DAPT x21 days. Medical admission for further stroke work-up with MRI head, MRA head/neck, TTE was recommended. Patient was given 300 mg Plavix load and aspirin 324 mg. The hospitalist service was consulted for further evaluation and management.  Review of Systems: All systems reviewed and are negative except as documented in history of present illness above.  **Interim History Cardiology, neurology and vascular surgery involved in the patient's care.  Cardiology felt the patient was acceptable risk for preoperative clearance and vascular surgery discussed the options of carotid endarterectomy versus a TCAR and patient is to discuss this further before making a decision.  Neurology recommending dual antiplatelet therapy.  Patient has decided to elect for the Left carotid endarterectomy and underwent the procedure yesterday and today's postoperative day 1 and doing very well.  Vascular surgery is cleared the patient for discharge and this morning patient was  orthostatic but denied any symptoms at all and denied any lightheadedness or imbalance.  PT OT evaluated and OT recommending outpatient PT which will be arranged.  He is stable to be discharged at this time will need to follow-up with PCP, vascular surgery and neurology in the outpatient setting and he was encouraged to be compliant with all his medications and remain well-hydrated.  Discharge Diagnoses:  Principal Problem:   Facial droop Active Problems:   Benign prostatic hyperplasia with nocturia   Sinus bradycardia   Carotid artery stenosis   S/P AVR (aortic valve replacement)   Mild CAD  Acute Right Facial Droop 2/2 to Left Brain TIA vs. Small CVA not seen on MRI -Admitted for stroke evaluation.  -CT head is negative.  -Teleneurology was consulted and patient has been loaded with Plavix 300 mg and aspirin 324 mg. -He takes aspirin 81 mg and rosuvastatin 10 mg daily as an outpatient. Discussed with on-call neurology, Dr. Rory Percy who recommends obtaining CTA head and neck instead of MRI at this time. -MRI brain showed "No acute intracranial abnormality. Generalized age-related cerebral atrophy with mild chronic small vessel ischemic disease." -CTA head and neck showed "Negative CTA for emergent large vessel occlusion. 75% atheromatous stenosis about the left carotid bifurcation. 50% stenosis at the origin of the right subclavian artery. Moderate approximate 50% stenosis at the origin of the left vertebral artery. Additional mild scattered atherosclerotic change elsewhere about the major arterial vasculature of the head and neck as above. No other hemodynamically significant or correctable stenosis." -Echocardiogram done and showed EF of 50-55% with mild concentric LVH and Paradoxical Spetal motion and Grade 1 DD  -Monitor on telemetry, continue neurochecks  -Neurology was formally consulted and felt that he could have a had a TIA versus a small stroke not seen on MRI -Continue with DAPT  aspirin 81 mg daily Plavix 75 mg daily for 3 weeks then just Plavix Alone -Increased home rosuvastatin to 20 mg daily -Allow permissive hypertension up to 220/110 -Check A1c and was 5.2, lipid panel done and showed a total cholesterol/HDL ratio 2.3, cholesterol level 109, HDL 47, LDL 52, triglycerides of 49, VLDL 10 -PT/OT/SLP eval; PT and OT recommend no F/U and SLP recommending outpatient SLP  -Neurology to follow, appreciate assistance and they recommended obtaining a vascular surgery consultation as they feel the patient had symptomatic left ICA stenosis and wanted help with vascular surgery opinion for a CEA -Cardiology was consulted for preoperative clearance in case she does need a CEA and he has an acceptable risk -Vascular surgery evaluating and recommending either CEA for reduction of stroke risk versus TCAR and patient is to discuss this further prior to making decision.  If he does go for surgery it can be done as inpatient or he can be discharged with a brief interval and readmitted for endarterectomy but the timing will be left to the vascular surgery team -Patient has elected for the left carotid endarterectomy and this was done today;  -Patient has had a history of a TIA in the past and he follows with Dr. Krista Blue at [facility]  Orthostatic hypotension, Asymptomatic  -Check orthostatic vitals; repeat was not done but he was orthostatic on admission on 03/06/2020 when his blood pressure lying went from 155/75 to standing 108/76 initially  -Initially Held Home Finasteride but resumed yesterday -He was getting IV fluid hydration and now stopped now. -Did well with Therapy this Afternoon and Had Orthostatic BP this AM but was completely Asymptomatic -Follow  closely with PCP and encouraged adequate Hydration as patient is eating and drinking well  Carotid artery stenosis s/p Left CEA POD1 -Follows with vascular surgery, Dr. Oneida Alar.8/12 carotid Dopplers show left  ICAstenosis40-59%, right ICA 1-39%.  -Follow-up MRI studies as above.  -Continue dual antiplatelet therapy with aspirin and Plavix; rosuvastatin as above. -CTA of the Head and Neck showed "Negative CTA for emergent large vessel occlusion. 75% atheromatous stenosis about the left carotid bifurcation. 50% stenosis at the origin of the right subclavian artery. Moderate approximate 50% stenosis at the origin of the left vertebral artery. Additional mild scattered atherosclerotic change elsewhere about the major arterial vasculature of the head and neck as above. No other hemodynamically significant or correctable stenosis." -Patient to underwenta left carotid endarterectomy today and is POD1  Sinus Bradycardia -Chronic and at patient's baseline. He is an avid Production designer, theatre/television/film. -HR was 53 this AM   Mild CAD Severe AS s/p tissue AVR: -Chronic and stable. Follows with cardiology, Dr. Angelena Form cardiothoracic surgery Dr. Cyndia Bent. -Continue Aspirin, Plavix, rosuvastatin as above  BPH: -Was Holding home Finasteride but resumed now; C/w 5 mg po daily at D/C   Depression/Anxiety: -Continue sertraline. -Resumed Home Valium and Trazodone   Normocytic Anemia -Expectant Post-Operative Drop and likely from that -Patient's Hgb/Hct went from 14.0/40.2 -> 13.2/38.1 -> 11.3/33.3 -Check Anemia Panel as an outpatient  -Continue to Monitor for S/Sx of Bleeding; Currently no overt bleeding noted -Repeat CBC within 1 week  Thrombocytopenia -Patient's Platelet Count went from 159 -> 125 -Likely reactive in the setting of Surgery -IS on ASA and Plavix along with VTE Prophylaxis -Continue to Monitor for S/Sx of Bleeding; Currently no overt bleeding noted -Repeat CBC within 1 week   Discharge Instructions  Discharge Instructions    Ambulatory referral to Occupational Therapy   Complete by: As directed    Call MD for:  difficulty breathing, headache or visual disturbances   Complete by: As directed     Call MD for:  extreme fatigue   Complete by: As directed    Call MD for:  hives   Complete by: As directed    Call MD for:  persistant dizziness or light-headedness   Complete by: As directed    Call MD for:  persistant nausea and vomiting   Complete by: As directed    Call MD for:  redness, tenderness, or signs of infection (pain, swelling, redness, odor or green/yellow discharge around incision site)   Complete by: As directed    Call MD for:  severe uncontrolled pain   Complete by: As directed    Call MD for:  temperature >100.4   Complete by: As directed    Diet - low sodium heart healthy   Complete by: As directed    Discharge instructions   Complete by: As directed    You were cared for by a hospitalist during your hospital stay. If you have any questions about your discharge medications or the care you received while you were in the hospital after you are discharged, you can call the unit and ask to speak with the hospitalist on call if the hospitalist that took care of you is not available. Once you are discharged, your primary care physician will handle any further medical issues. Please note that NO REFILLS for any discharge medications will be authorized once you are discharged, as it is imperative that you return to your primary care physician (or establish a relationship with a primary care physician if you do not  have one) for your aftercare needs so that they can reassess your need for medications and monitor your lab values.  Follow up with PCP, Neurology, and Vascular Surgery. Take all medications as prescribed. If symptoms change or worsen please return to the ED for evaluation   Discharge wound care:   Complete by: As directed    Per Vascular Surgery   Increase activity slowly   Complete by: As directed      Allergies as of 03/09/2020      Reactions   Flomax [tamsulosin] Other (See Comments)   Made feel foggy headed      Medication List    STOP taking these  medications   ALPRAZolam 0.5 MG tablet Commonly known as: XANAX     TAKE these medications   amoxicillin 500 MG capsule Commonly known as: AMOXIL TAKE 4 CAPSULES BY MOUTH ONE TO TWO HOURS PRIOR TO DENTAL APPOINTMENT What changed:   how much to take  how to take this  when to take this   aspirin 81 MG EC tablet Take 1 tablet (81 mg total) by mouth daily. Swallow whole. Start taking on: March 10, 2020 What changed: additional instructions   clopidogrel 75 MG tablet Commonly known as: PLAVIX Take 1 tablet (75 mg total) by mouth daily.   diazepam 5 MG tablet Commonly known as: VALIUM Take 5 mg by mouth 2 (two) times daily as needed for anxiety.   finasteride 5 MG tablet Commonly known as: PROSCAR Take 1 tablet (5 mg total) by mouth daily.   multivitamin with minerals Tabs tablet Take 1 tablet by mouth daily.   oxyCODONE-acetaminophen 5-325 MG tablet Commonly known as: PERCOCET/ROXICET Take 1-2 tablets by mouth every 4 (four) hours as needed for moderate pain.   pantoprazole 40 MG tablet Commonly known as: PROTONIX Take 1 tablet (40 mg total) by mouth daily. Start taking on: March 10, 2020   polyethylene glycol 17 g packet Commonly known as: MIRALAX / GLYCOLAX Take 17 g by mouth daily. What changed:   when to take this  reasons to take this   Prevagen 10 MG Caps Generic drug: Apoaequorin Take 10 mg by mouth daily.   rosuvastatin 20 MG tablet Commonly known as: CRESTOR Take 1 tablet (20 mg total) by mouth daily. Start taking on: March 10, 2020 What changed:   medication strength  how much to take  when to take this   senna-docusate 8.6-50 MG tablet Commonly known as: Senokot-S Take 1 tablet by mouth at bedtime as needed for mild constipation.   sertraline 100 MG tablet Commonly known as: ZOLOFT Take 100 mg by mouth daily.   sildenafil 20 MG tablet Commonly known as: REVATIO Take 20 mg by mouth daily as needed (ED).   traZODone 100 MG  tablet Commonly known as: DESYREL Take 50-100 mg by mouth at bedtime.            Discharge Care Instructions  (From admission, onward)         Start     Ordered   03/09/20 0000  Discharge wound care:       Comments: Per Vascular Surgery   03/09/20 1324          Follow-up Information    Marcial Pacas, MD. Go on 03/14/2020.   Specialty: Neurology Contact information: [address] [place] [address] [phone]        Rosetta Posner, MD In 2 weeks.   Specialties: Vascular Surgery, Cardiology Why: The  office will call the patient with an appointment Contact information: [place] [address] [phone]        [name], MD. Call.   Specialty: Internal Medicine Why: Follow up within 1-2 weeks Contact information: [address] [place] [address] [phone]        Burnell Blanks, MD .   Specialty: Cardiology Contact information: [place]. [address] [place] [address] [phone]              Allergies  Allergen Reactions  . Flomax [Tamsulosin] Other (See Comments)    Made feel foggy headed    Consultations:  Neurology  Cardiology  Vascular Surgery  Procedures/Studies: CT HEAD WO CONTRAST  Result Date: 03/05/2020 CLINICAL DATA:  Acute neuro deficit. Facial droop today. Dizziness. EXAM: CT HEAD WITHOUT CONTRAST TECHNIQUE: Contiguous axial images were obtained from the base of the skull through the vertex without intravenous contrast. COMPARISON:  CT head 11/18/2019 FINDINGS: Brain: Ventricle size and cerebral volume normal for age. Negative for acute infarct, hemorrhage, mass Vascular: Negative for hyperdense vessel Skull: Negative Sinuses/Orbits: Paranasal sinuses clear.  No orbital lesion. Other: None IMPRESSION: No acute intracranial abnormality.  Negative for age. Electronically Signed   By: Franchot Gallo M.D.   On: 03/05/2020 12:53   MR BRAIN WO CONTRAST  Result  Date: 03/06/2020 CLINICAL DATA:  Initial evaluation for neuro deficit, stroke suspected. EXAM: MRI HEAD WITHOUT CONTRAST TECHNIQUE: Multiplanar, multiecho pulse sequences of the brain and surrounding structures were obtained without intravenous contrast. COMPARISON:  Prior head CT from earlier the same day. FINDINGS: Brain: Generalized age-related cerebral atrophy. Scattered patchy T2/FLAIR hyperintensity within the periventricular and deep white matter both cerebral hemispheres most consistent with chronic small vessel ischemic disease, mild for age. No definite abnormal foci of restricted diffusion to suggest acute or subacute ischemia. Please note evaluation is somewhat limited due to motion artifact and poor resolution on axial DWI sequence. Gray-white matter differentiation maintained. No encephalomalacia to suggest chronic cortical infarction. No acute intracranial hemorrhage. Single punctate chronic microhemorrhage noted within the left frontal lobe, of doubtful significance in isolation. No mass lesion, midline shift or mass effect. No hydrocephalus or extra-axial fluid collection. Pituitary gland suprasellar region normal. Midline structures intact. Vascular: Major intracranial vascular flow voids are maintained. Skull and upper cervical spine: Craniocervical junction normal. Bone marrow signal intensity within normal limits. No scalp soft tissue abnormality. Sinuses/Orbits: Globes and orbital soft tissues within normal limits. Paranasal sinuses are largely clear. No mastoid effusion. Inner ear structures grossly normal. Other: None. IMPRESSION: 1. No acute intracranial abnormality. 2. Generalized age-related cerebral atrophy with mild chronic small vessel ischemic disease. Electronically Signed   By: Jeannine Boga M.D.   On: 03/06/2020 00:21   ECHOCARDIOGRAM COMPLETE  Result Date: 03/06/2020    ECHOCARDIOGRAM REPORT   Patient Name:   GURKIRAT BASHER Date of Exam: 03/06/2020 Medical Rec #:   [id number]     Height:       69.0 in Accession #:    [id number]    Weight:       171.1 lb Date of Birth:  June 19, 1941      BSA:          1.933 m Patient Age:    79 years      BP:           141/72 mmHg Patient Gender: M             HR:  56 bpm. Exam Location:  Inpatient Procedure: 2D Echo Indications:    Stroke I163.9  History:        Patient has prior history of Echocardiogram examinations, most                 recent 01/26/2019. CAD.                 Aortic Valve: 23 mm bioprosthetic valve is present in the aortic                 position. Procedure Date: 11/10/2018.  Sonographer:    Mikki Santee RDCS (AE) Referring Phys: [id number] [place]  1. Left ventricular ejection fraction, by estimation, is 50 to 55%. The left ventricle has low normal function. The left ventricle has no regional wall motion abnormalities. There is mild concentric left ventricular hypertrophy. Left ventricular diastolic parameters are consistent with Grade I diastolic dysfunction (impaired relaxation).  2. Right ventricular systolic function is low normal. The right ventricular size is mildly enlarged. There is normal pulmonary artery systolic pressure. The estimated right ventricular systolic pressure is 74.0 mmHg.  3. Left atrial size was mild to moderately dilated.  4. Right atrial size was mildly dilated.  5. The mitral valve is grossly normal. Mild mitral valve regurgitation. No evidence of mitral stenosis.  6. 23 mm surgical prosthetic valve present with V max 2.8 m/s, MG 17 mmHG, EOA 1.40 cm2. The aortic valve has been repaired/replaced. Aortic valve regurgitation is not visualized. No aortic stenosis is present. There is a 23 mm bioprosthetic valve present in the aortic position. Procedure Date: 11/10/2018. Echo findings are consistent with normal structure and function of the aortic valve prosthesis.  7. The inferior vena cava is normal in size with greater than 50% respiratory variability, suggesting right  atrial pressure of 3 mmHg. Comparison(s): No significant change from prior study. Conclusion(s)/Recommendation(s): No intracardiac source of embolism detected on this transthoracic study. A transesophageal echocardiogram is recommended to exclude cardiac source of embolism if clinically indicated. FINDINGS  Left Ventricle: Left ventricular ejection fraction, by estimation, is 50 to 55%. The left ventricle has low normal function. The left ventricle has no regional wall motion abnormalities. The left ventricular internal cavity size was normal in size. There is mild concentric left ventricular hypertrophy. Abnormal (paradoxical) septal motion consistent with post-operative status. Left ventricular diastolic parameters are consistent with Grade I diastolic dysfunction (impaired relaxation). Right Ventricle: The right ventricular size is mildly enlarged. No increase in right ventricular wall thickness. Right ventricular systolic function is low normal. There is normal pulmonary artery systolic pressure. The tricuspid regurgitant velocity is 2.14 m/s, and with an assumed right atrial pressure of 3 mmHg, the estimated right ventricular systolic pressure is 81.4 mmHg. Left Atrium: Left atrial size was mild to moderately dilated. Right Atrium: Right atrial size was mildly dilated. Pericardium: There is no evidence of pericardial effusion. Mitral Valve: The mitral valve is grossly normal. Mild mitral valve regurgitation. No evidence of mitral valve stenosis. Tricuspid Valve: The tricuspid valve is grossly normal. Tricuspid valve regurgitation is mild . No evidence of tricuspid stenosis. Aortic Valve: 23 mm surgical prosthetic valve present with V max 2.8 m/s, MG 17 mmHG, EOA 1.40 cm2. The aortic valve has been repaired/replaced. Aortic valve regurgitation is not visualized. No aortic stenosis is present. Aortic valve mean gradient measures 17.0 mmHg. Aortic valve peak gradient measures 32.7 mmHg. Aortic valve area, by VTI  measures 1.40 cm. There is a 23  mm bioprosthetic valve present in the aortic position. Procedure Date: 11/10/2018. Echo findings are consistent with normal structure and function of the aortic valve prosthesis. Pulmonic Valve: The pulmonic valve was grossly normal. Pulmonic valve regurgitation is not visualized. No evidence of pulmonic stenosis. Aorta: The aortic root is normal in size and structure. Venous: The inferior vena cava is normal in size with greater than 50% respiratory variability, suggesting right atrial pressure of 3 mmHg. IAS/Shunts: The atrial septum is grossly normal. EKG: Rhythm strip during this exam demostrated sinus bradycardia and premature ventricular contractions.  LEFT VENTRICLE PLAX 2D LVIDd:         5.04 cm  Diastology LVIDs:         3.65 cm  LV e' lateral:   6.20 cm/s LV PW:         1.18 cm  LV E/e' lateral: 10.0 LV IVS:        1.21 cm  LV e' medial:    4.24 cm/s LVOT diam:     2.20 cm  LV E/e' medial:  14.6 LV SV:         89 LV SV Index:   46 LVOT Area:     3.80 cm  RIGHT VENTRICLE RV S prime:     11.50 cm/s TAPSE (M-mode): 1.3 cm LEFT ATRIUM              Index       RIGHT ATRIUM           Index LA diam:        4.60 cm  2.38 cm/m  RA Area:     27.80 cm LA Vol (A2C):   128.0 ml 66.21 ml/m RA Volume:   93.60 ml  48.41 ml/m LA Vol (A4C):   68.6 ml  35.48 ml/m LA Biplane Vol: 97.5 ml  50.43 ml/m  AORTIC VALVE AV Area (Vmax):    1.27 cm AV Area (Vmean):   1.40 cm AV Area (VTI):     1.40 cm AV Vmax:           286.00 cm/s AV Vmean:          194.000 cm/s AV VTI:            0.634 m AV Peak Grad:      32.7 mmHg AV Mean Grad:      17.0 mmHg LVOT Vmax:         95.80 cm/s LVOT Vmean:        71.500 cm/s LVOT VTI:          0.233 m LVOT/AV VTI ratio: 0.37  AORTA Ao Root diam: 3.50 cm MITRAL VALVE               TRICUSPID VALVE MV Area (PHT): 1.48 cm    TR Peak grad:   18.3 mmHg MV Decel Time: 511 msec    TR Vmax:        214.00 cm/s MV E velocity: 61.70 cm/s MV A velocity: 57.00 cm/s  SHUNTS  MV E/A ratio:  1.08        Systemic VTI:  0.23 m                            Systemic Diam: 2.20 cm Eleonore Chiquito MD Electronically signed by Eleonore Chiquito MD Signature Date/Time: 03/06/2020/12:23:13 PM    Final    VAS US CAROTID  Result Date: 02/22/2020 Carotid Arterial Duplex Study Indications:  Carotid artery disease. Performing Technologist: Alvia Grove RVT  Examination Guidelines: A complete evaluation includes B-mode imaging, spectral Doppler, color Doppler, and power Doppler as needed of all accessible portions of each vessel. Bilateral testing is considered an integral part of a complete examination. Limited examinations for reoccurring indications may be performed as noted.  Right Carotid Findings: +----------+--------+--------+--------+-------------------------+--------+           PSV cm/sEDV cm/sStenosisPlaque Description       Comments +----------+--------+--------+--------+-------------------------+--------+ CCA Prox  119     18              heterogenous                      +----------+--------+--------+--------+-------------------------+--------+ CCA Mid   114     19              heterogenous and calcific         +----------+--------+--------+--------+-------------------------+--------+ CCA Distal103     22                                                +----------+--------+--------+--------+-------------------------+--------+ ICA Prox  74      9               heterogenous                      +----------+--------+--------+--------+-------------------------+--------+ ICA Mid   64      22                                                +----------+--------+--------+--------+-------------------------+--------+ ICA Distal69      18                                                +----------+--------+--------+--------+-------------------------+--------+ ECA       101     12                                                 +----------+--------+--------+--------+-------------------------+--------+ +----------+--------+-------+----------------+-------------------+           PSV cm/sEDV cmsDescribe        Arm Pressure (mmHG) +----------+--------+-------+----------------+-------------------+ Subclavian101     3      Multiphasic, WNL                    +----------+--------+-------+----------------+-------------------+ +---------+--------+--+--------+-+---------+ VertebralPSV cm/s45EDV cm/s9Antegrade +---------+--------+--+--------+-+---------+  Left Carotid Findings: +----------+--------+--------+--------+------------------+--------+           PSV cm/sEDV cm/sStenosisPlaque DescriptionComments +----------+--------+--------+--------+------------------+--------+ CCA Prox  110     14              homogeneous                +----------+--------+--------+--------+------------------+--------+ CCA Mid   96      20              heterogenous               +----------+--------+--------+--------+------------------+--------+  CCA Distal97      21              heterogenous               +----------+--------+--------+--------+------------------+--------+ ICA Prox  262     58      40-59%  calcific                   +----------+--------+--------+--------+------------------+--------+ ICA Mid   86      18                                         +----------+--------+--------+--------+------------------+--------+ ICA Distal58      18                                         +----------+--------+--------+--------+------------------+--------+ ECA       265     45      >50%    heterogenous               +----------+--------+--------+--------+------------------+--------+ +----------+--------+--------+----------------+-------------------+           PSV cm/sEDV cm/sDescribe        Arm Pressure (mmHG) +----------+--------+--------+----------------+-------------------+ Subclavian122      3       Multiphasic, WNL                    +----------+--------+--------+----------------+-------------------+ +---------+--------+--+--------+--+---------+ VertebralPSV cm/s56EDV cm/s11Antegrade +---------+--------+--+--------+--+---------+   Summary: Right Carotid: Velocities in the right ICA are consistent with a 1-39% stenosis. Left Carotid: Velocities in the left ICA are consistent with a 40-59% stenosis. Vertebrals:  Bilateral vertebral arteries demonstrate antegrade flow. Subclavians: Normal flow hemodynamics were seen in bilateral subclavian              arteries. *See table(s) above for measurements and observations.  Electronically signed by Ruta Hinds MD on 02/22/2020 at 11:25:42 AM.    Final    CT ANGIO HEAD CODE STROKE  Result Date: 03/06/2020 CLINICAL DATA:  Initial evaluation for neuro deficit, stroke suspected. EXAM: CT ANGIOGRAPHY HEAD AND NECK TECHNIQUE: Multidetector CT imaging of the head and neck was performed using the standard protocol during bolus administration of intravenous contrast. Multiplanar CT image reconstructions and MIPs were obtained to evaluate the vascular anatomy. Carotid stenosis measurements (when applicable) are obtained utilizing NASCET criteria, using the distal internal carotid diameter as the denominator. CONTRAST:  25mL OMNIPAQUE IOHEXOL 350 MG/ML SOLN COMPARISON:  Prior head CT from earlier the same day. FINDINGS: CTA NECK FINDINGS Aortic arch: Visualized aortic arch normal in caliber with normal branch pattern. Mild atheromatous change about the arch itself. No visible hemodynamically significant stenosis about the origin of the great vessels. Right carotid system: Scattered eccentric calcified plaque within the right CCA without flow-limiting stenosis. Additional eccentric calcified plaque at the origin of the right ICA without significant stenosis. Right ICA patent distally to the skull base without stenosis, dissection or occlusion. Left carotid  system: Left CCA widely patent proximally. Concentric mixed plaque about the left carotid bifurcation with associated stenosis of up to 75% by NASCET criteria (series 8, image 233). Left ICA patent distally to the skull base without stenosis, dissection or occlusion. Vertebral arteries: Both vertebral arteries arise from the subclavian arteries. Eccentric calcified plaque at the origin of the right subclavian artery  with approximate 50% stenosis (series 8, image 78). Moderate approximate 50% stenosis at the origin of the left vertebral artery (series 8, image 114). Vertebral arteries otherwise widely patent within the neck without stenosis, dissection or occlusion. Skeleton: No acute osseous abnormality. Sclerotic lesion within the C3 spinous process most consistent with a small benign bone island. No other discrete or worrisome osseous lesions. Mild multilevel cervical spondylosis without high-grade stenosis. Other neck: No other acute soft tissue abnormality within the neck. No mass lesion or adenopathy. Upper chest: Visualized upper chest demonstrates no acute finding. Review of the MIP images confirms the above findings CTA HEAD FINDINGS Anterior circulation: Petrous segments widely patent bilaterally. Scattered atheromatous plaque throughout the carotid siphons with associated mild multifocal narrowing. ICA termini well perfused. A1 segments widely patent. Normal anterior communicating artery complex. Anterior cerebral arteries widely patent to their distal aspects without stenosis. No M1 stenosis or occlusion. Normal MCA bifurcations. Distal MCA branches well perfused and symmetric. Posterior circulation: Vertebral arteries patent to the vertebrobasilar junction without stenosis. Mild non stenotic plaque noted within the proximal left V4 segment. Left PICA patent. Right PICA not seen. Basilar patent to its distal aspect without stenosis. Superior cerebral arteries patent bilaterally. Both PCAs primarily  supplied via the basilar perfused to their distal aspects without stenosis. Venous sinuses: Patent allowing for timing the contrast bolus. Anatomic variants: None significant.  No intracranial aneurysm. Review of the MIP images confirms the above findings IMPRESSION: 1. Negative CTA for emergent large vessel occlusion. 2. 75% atheromatous stenosis about the left carotid bifurcation. 3. 50% stenosis at the origin of the right subclavian artery. 4. Moderate approximate 50% stenosis at the origin of the left vertebral artery. 5. Additional mild scattered atherosclerotic change elsewhere about the major arterial vasculature of the head and neck as above. No other hemodynamically significant or correctable stenosis. Electronically Signed   By: Jeannine Boga M.D.   On: 03/06/2020 00:46   CT ANGIO NECK CODE STROKE  Result Date: 03/06/2020 CLINICAL DATA:  Initial evaluation for neuro deficit, stroke suspected. EXAM: CT ANGIOGRAPHY HEAD AND NECK TECHNIQUE: Multidetector CT imaging of the head and neck was performed using the standard protocol during bolus administration of intravenous contrast. Multiplanar CT image reconstructions and MIPs were obtained to evaluate the vascular anatomy. Carotid stenosis measurements (when applicable) are obtained utilizing NASCET criteria, using the distal internal carotid diameter as the denominator. CONTRAST:  47mL OMNIPAQUE IOHEXOL 350 MG/ML SOLN COMPARISON:  Prior head CT from earlier the same day. FINDINGS: CTA NECK FINDINGS Aortic arch: Visualized aortic arch normal in caliber with normal branch pattern. Mild atheromatous change about the arch itself. No visible hemodynamically significant stenosis about the origin of the great vessels. Right carotid system: Scattered eccentric calcified plaque within the right CCA without flow-limiting stenosis. Additional eccentric calcified plaque at the origin of the right ICA without significant stenosis. Right ICA patent distally to  the skull base without stenosis, dissection or occlusion. Left carotid system: Left CCA widely patent proximally. Concentric mixed plaque about the left carotid bifurcation with associated stenosis of up to 75% by NASCET criteria (series 8, image 233). Left ICA patent distally to the skull base without stenosis, dissection or occlusion. Vertebral arteries: Both vertebral arteries arise from the subclavian arteries. Eccentric calcified plaque at the origin of the right subclavian artery with approximate 50% stenosis (series 8, image 78). Moderate approximate 50% stenosis at the origin of the left vertebral artery (series 8, image 114). Vertebral arteries otherwise widely patent within  the neck without stenosis, dissection or occlusion. Skeleton: No acute osseous abnormality. Sclerotic lesion within the C3 spinous process most consistent with a small benign bone island. No other discrete or worrisome osseous lesions. Mild multilevel cervical spondylosis without high-grade stenosis. Other neck: No other acute soft tissue abnormality within the neck. No mass lesion or adenopathy. Upper chest: Visualized upper chest demonstrates no acute finding. Review of the MIP images confirms the above findings CTA HEAD FINDINGS Anterior circulation: Petrous segments widely patent bilaterally. Scattered atheromatous plaque throughout the carotid siphons with associated mild multifocal narrowing. ICA termini well perfused. A1 segments widely patent. Normal anterior communicating artery complex. Anterior cerebral arteries widely patent to their distal aspects without stenosis. No M1 stenosis or occlusion. Normal MCA bifurcations. Distal MCA branches well perfused and symmetric. Posterior circulation: Vertebral arteries patent to the vertebrobasilar junction without stenosis. Mild non stenotic plaque noted within the proximal left V4 segment. Left PICA patent. Right PICA not seen. Basilar patent to its distal aspect without stenosis.  Superior cerebral arteries patent bilaterally. Both PCAs primarily supplied via the basilar perfused to their distal aspects without stenosis. Venous sinuses: Patent allowing for timing the contrast bolus. Anatomic variants: None significant.  No intracranial aneurysm. Review of the MIP images confirms the above findings IMPRESSION: 1. Negative CTA for emergent large vessel occlusion. 2. 75% atheromatous stenosis about the left carotid bifurcation. 3. 50% stenosis at the origin of the right subclavian artery. 4. Moderate approximate 50% stenosis at the origin of the left vertebral artery. 5. Additional mild scattered atherosclerotic change elsewhere about the major arterial vasculature of the head and neck as above. No other hemodynamically significant or correctable stenosis. Electronically Signed   By: Jeannine Boga M.D.   On: 03/06/2020 00:46   ECHOCARDIOGRAM IMPRESSIONS    1. Left ventricular ejection fraction, by estimation, is 50 to 55%. The  left ventricle has low normal function. The left ventricle has no regional  wall motion abnormalities. There is mild concentric left ventricular  hypertrophy. Left ventricular  diastolic parameters are consistent with Grade I diastolic dysfunction  (impaired relaxation).  2. Right ventricular systolic function is low normal. The right  ventricular size is mildly enlarged. There is normal pulmonary artery  systolic pressure. The estimated right ventricular systolic pressure is  65.9 mmHg.  3. Left atrial size was mild to moderately dilated.  4. Right atrial size was mildly dilated.  5. The mitral valve is grossly normal. Mild mitral valve regurgitation.  No evidence of mitral stenosis.  6. 23 mm surgical prosthetic valve present with V max 2.8 m/s, MG 17  mmHG, EOA 1.40 cm2. The aortic valve has been repaired/replaced. Aortic  valve regurgitation is not visualized. No aortic stenosis is present.  There is a 23 mm bioprosthetic valve   present in the aortic position. Procedure Date: 11/10/2018. Echo findings  are consistent with normal structure and function of the aortic valve  prosthesis.  7. The inferior vena cava is normal in size with greater than 50%  respiratory variability, suggesting right atrial pressure of 3 mmHg.   Comparison(s): No significant change from prior study.   Conclusion(s)/Recommendation(s): No intracardiac source of embolism  detected on this transthoracic study. A transesophageal echocardiogram is  recommended to exclude cardiac source of embolism if clinically indicated.   FINDINGS  Left Ventricle: Left ventricular ejection fraction, by estimation, is 50  to 55%. The left ventricle has low normal function. The left ventricle has  no regional wall motion abnormalities. The  left ventricular internal  cavity size was normal in size.  There is mild concentric left ventricular hypertrophy. Abnormal  (paradoxical) septal motion consistent with post-operative status. Left  ventricular diastolic parameters are consistent with Grade I diastolic  dysfunction (impaired relaxation).   Right Ventricle: The right ventricular size is mildly enlarged. No  increase in right ventricular wall thickness. Right ventricular systolic  function is low normal. There is normal pulmonary artery systolic  pressure. The tricuspid regurgitant velocity is  2.14 m/s, and with an assumed right atrial pressure of 3 mmHg, the  estimated right ventricular systolic pressure is 02.7 mmHg.   Left Atrium: Left atrial size was mild to moderately dilated.   Right Atrium: Right atrial size was mildly dilated.   Pericardium: There is no evidence of pericardial effusion.   Mitral Valve: The mitral valve is grossly normal. Mild mitral valve  regurgitation. No evidence of mitral valve stenosis.   Tricuspid Valve: The tricuspid valve is grossly normal. Tricuspid valve  regurgitation is mild . No evidence of tricuspid stenosis.    Aortic Valve: 23 mm surgical prosthetic valve present with V max 2.8 m/s,  MG 17 mmHG, EOA 1.40 cm2. The aortic valve has been repaired/replaced.  Aortic valve regurgitation is not visualized. No aortic stenosis is  present. Aortic valve mean gradient  measures 17.0 mmHg. Aortic valve peak gradient measures 32.7 mmHg. Aortic  valve area, by VTI measures 1.40 cm. There is a 23 mm bioprosthetic valve  present in the aortic position. Procedure Date: 11/10/2018. Echo findings  are consistent with normal  structure and function of the aortic valve prosthesis.   Pulmonic Valve: The pulmonic valve was grossly normal. Pulmonic valve  regurgitation is not visualized. No evidence of pulmonic stenosis.   Aorta: The aortic root is normal in size and structure.   Venous: The inferior vena cava is normal in size with greater than 50%  respiratory variability, suggesting right atrial pressure of 3 mmHg.   IAS/Shunts: The atrial septum is grossly normal.   EKG: Rhythm strip during this exam demostrated sinus bradycardia and  premature ventricular contractions.     LEFT VENTRICLE  PLAX 2D  LVIDd:     5.04 cm Diastology  LVIDs:     3.65 cm LV e' lateral:  6.20 cm/s  LV PW:     1.18 cm LV E/e' lateral: 10.0  LV IVS:    1.21 cm LV e' medial:  4.24 cm/s  LVOT diam:   2.20 cm LV E/e' medial: 14.6  LV SV:     89  LV SV Index:  46  LVOT Area:   3.80 cm     RIGHT VENTRICLE  RV S prime:   11.50 cm/s  TAPSE (M-mode): 1.3 cm   LEFT ATRIUM       Index    RIGHT ATRIUM      Index  LA diam:    4.60 cm 2.38 cm/m RA Area:   27.80 cm  LA Vol (A2C):  128.0 ml 66.21 ml/m RA Volume:  93.60 ml 48.41 ml/m  LA Vol (A4C):  68.6 ml 35.48 ml/m  LA Biplane Vol: 97.5 ml 50.43 ml/m  AORTIC VALVE  AV Area (Vmax):  1.27 cm  AV Area (Vmean):  1.40 cm  AV Area (VTI):   1.40 cm  AV Vmax:      286.00 cm/s  AV Vmean:      194.000 cm/s  AV VTI:      0.634 m  AV  Peak Grad:   32.7 mmHg  AV Mean Grad:   17.0 mmHg  LVOT Vmax:     95.80 cm/s  LVOT Vmean:    71.500 cm/s  LVOT VTI:     0.233 m  LVOT/AV VTI ratio: 0.37    AORTA  Ao Root diam: 3.50 cm   MITRAL VALVE        TRICUSPID VALVE  MV Area (PHT): 1.48 cm  TR Peak grad:  18.3 mmHg  MV Decel Time: 511 msec  TR Vmax:    214.00 cm/s  MV E velocity: 61.70 cm/s  MV A velocity: 57.00 cm/s SHUNTS  MV E/A ratio: 1.08    Systemic VTI: 0.23 m               Systemic Diam: 2.20 cm   Subjective: Seen and examined at bedside had absolutely no complaints today and felt well and wanted to go home.  No chest pain, lightheadedness or dizziness.  He was orthostatic this morning but was completely asymptomatic.  Denies any other concerns or complaints at this time and feels much better today than he did yesterday.  Still complaining of some neck pain a little bit but states is bearable.  No other concerns or close at this time.  Discharge Exam: Vitals:   03/09/20 0945 03/09/20 1208  BP: 116/67 117/66  Pulse: (!) 57 (!) 50  Resp:  18  Temp: (!) 97.5 F (36.4 C) 97.9 F (36.6 C)  SpO2: 100% 100%   Vitals:   03/09/20 0632 03/09/20 0641 03/09/20 0945 03/09/20 1208  BP: (!) 217/189 108/68 116/67 117/66  Pulse: (!) 46 (!) 53 (!) 57 (!) 50  Resp:  18  18  Temp:  (!) 96.6 F (35.9 C) (!) 97.5 F (36.4 C) 97.9 F (36.6 C)  TempSrc:  Axillary Oral Oral  SpO2: 99% 100% 100% 100%  Weight:      Height:       General: Pt is alert, awake, not in acute distress Cardiovascular: RRR, S1/S2 +, no rubs, no gallops Respiratory: Diminished bilaterally, no wheezing, no rhonchi Abdominal: Soft, NT, Mildly distended, bowel sounds + Extremities: no edema, no cyanosis  The results of significant diagnostics from this hospitalization (including imaging, microbiology, ancillary and laboratory) are listed below for  reference.    Microbiology: Recent Results (from the past 240 hour(s))  SARS Coronavirus 2 by RT PCR (hospital order, performed in [facility] hospital lab) Nasopharyngeal Nasopharyngeal Swab     Status: None   Collection Time: 03/05/20  2:50 PM   Specimen: Nasopharyngeal Swab  Result Value Ref Range Status   SARS Coronavirus 2 NEGATIVE NEGATIVE Final    Comment: (NOTE) SARS-CoV-2 target nucleic acids are NOT DETECTED.  The SARS-CoV-2 RNA is generally detectable in upper and lower respiratory specimens during the acute phase of infection. The lowest concentration of SARS-CoV-2 viral copies this assay can detect is 250 copies / mL. A negative result does not preclude SARS-CoV-2 infection and should not be used as the sole basis for treatment or other patient management decisions.  A negative result may occur with improper specimen collection / handling, submission of specimen other than nasopharyngeal swab, presence of viral mutation(s) within the areas targeted by this assay, and inadequate number of viral copies (<250 copies / mL). A negative result must be combined with clinical observations, patient history, and epidemiological information.  Fact Sheet for Patients:   StrictlyIdeas.no  Fact Sheet for Healthcare Providers: BankingDealers.co.za  This test is  not yet approved or  cleared by the [place] and has been authorized for detection and/or diagnosis of SARS-CoV-2 by FDA under an Emergency Use Authorization (EUA).  This EUA will remain in effect (meaning this test can be used) for the duration of the COVID-19 declaration under Section 564(b)(1) of the Act, 21 U.S.C. section 360bbb-3(b)(1), unless the authorization is terminated or revoked sooner.  Performed at [facility], [address] [place], [place] [address]   MRSA PCR Screening     Status: None   Collection Time: 03/07/20  7:42 PM   Specimen:  Nasopharyngeal  Result Value Ref Range Status   MRSA by PCR NEGATIVE NEGATIVE Final    Comment:        The GeneXpert MRSA Assay (FDA approved for NASAL specimens only), is one component of a comprehensive MRSA colonization surveillance program. It is not intended to diagnose MRSA infection nor to guide or monitor treatment for MRSA infections. Performed at [facility], [place] [address] [place], [place] [address]     Labs: BNP (last 3 results) No results for input(s): BNP in the last 8760 hours. Basic Metabolic Panel: Recent Labs  Lab 03/05/20 1209 03/05/20 1209 03/06/20 0158 03/07/20 0346 03/08/20 0347 03/08/20 1312 03/09/20 0706  NA 139  --  139 136 139  --  140  K 4.3  --  4.0 4.5 4.3  --  3.9  CL 103  --  104 101 104  --  105  CO2 27  --  28 27 25   --  25  GLUCOSE 108*  --  76 93 91  --  109*  BUN 20  --  18 19 18   --  21  CREATININE 1.01   < > 0.86 0.97 1.09 1.15 0.78  CALCIUM 8.7*  --  8.7* 9.0 8.9  --  8.4*  MG  --   --   --  1.9 2.5*  --  1.9  PHOS  --   --   --  3.8 4.5  --  3.8   < > = values in this interval not displayed.   Liver Function Tests: Recent Labs  Lab 03/05/20 1209 03/07/20 0346 03/08/20 0347 03/09/20 0706  AST 22 21 18 16   ALT 21 19 16 14   ALKPHOS 45 48 43 40  BILITOT 0.6 1.1 0.8 0.6  PROT 6.5 6.2* 6.4* 5.2*  ALBUMIN 3.7 3.5 3.4* 2.9*   No results for input(s): LIPASE, AMYLASE in the last 168 hours. No results for input(s): AMMONIA in the last 168 hours. CBC: Recent Labs  Lab 03/05/20 1209 03/05/20 1209 03/06/20 0158 03/07/20 0346 03/08/20 0347 03/08/20 1312 03/09/20 0706  WBC 6.6   < > 6.7 7.0 6.8 8.0 9.9  NEUTROABS 3.8  --   --  4.3 3.4  --  6.9  HGB 14.0   < > 14.5 14.4 14.0 13.2 11.3*  HCT 41.9   < > 42.9 40.7 40.2 38.1* 33.3*  MCV 97.9   < > 96.6 95.5 96.4 97.2 96.8  PLT 158   < > 162 166 163 159 125*   < > = values in this interval not displayed.   Cardiac Enzymes: No results for input(s): CKTOTAL, CKMB,  CKMBINDEX, TROPONINI in the last 168 hours. BNP: Invalid input(s): POCBNP CBG: No results for input(s): GLUCAP in the last 168 hours. D-Dimer No results for input(s): DDIMER in the last 72 hours. Hgb A1c No results for input(s):  HGBA1C in the last 72 hours. Lipid Profile No results for input(s): CHOL, HDL, LDLCALC, TRIG, CHOLHDL, LDLDIRECT in the last 72 hours. Thyroid function studies No results for input(s): TSH, T4TOTAL, T3FREE, THYROIDAB in the last 72 hours.  Invalid input(s): FREET3 Anemia work up No results for input(s): VITAMINB12, FOLATE, FERRITIN, TIBC, IRON, RETICCTPCT in the last 72 hours. Urinalysis    Component Value Date/Time   COLORURINE YELLOW 03/05/2020 1350   APPEARANCEUR CLEAR 03/05/2020 1350   LABSPEC 1.025 03/05/2020 1350   PHURINE 5.0 03/05/2020 1350   GLUCOSEU NEGATIVE 03/05/2020 1350   HGBUR TRACE (A) 03/05/2020 1350   BILIRUBINUR NEGATIVE 03/05/2020 1350   KETONESUR NEGATIVE 03/05/2020 1350   PROTEINUR NEGATIVE 03/05/2020 1350   NITRITE NEGATIVE 03/05/2020 1350   LEUKOCYTESUR NEGATIVE 03/05/2020 1350   Sepsis Labs Invalid input(s): PROCALCITONIN,  WBC,  LACTICIDVEN Microbiology Recent Results (from the past 240 hour(s))  SARS Coronavirus 2 by RT PCR (hospital order, performed in [facility] hospital lab) Nasopharyngeal Nasopharyngeal Swab     Status: None   Collection Time: 03/05/20  2:50 PM   Specimen: Nasopharyngeal Swab  Result Value Ref Range Status   SARS Coronavirus 2 NEGATIVE NEGATIVE Final    Comment: (NOTE) SARS-CoV-2 target nucleic acids are NOT DETECTED.  The SARS-CoV-2 RNA is generally detectable in upper and lower respiratory specimens during the acute phase of infection. The lowest concentration of SARS-CoV-2 viral copies this assay can detect is 250 copies / mL. A negative result does not preclude SARS-CoV-2 infection and should not be used as the sole basis for treatment or other patient management decisions.  A negative  result may occur with improper specimen collection / handling, submission of specimen other than nasopharyngeal swab, presence of viral mutation(s) within the areas targeted by this assay, and inadequate number of viral copies (<250 copies / mL). A negative result must be combined with clinical observations, patient history, and epidemiological information.  Fact Sheet for Patients:   StrictlyIdeas.no  Fact Sheet for Healthcare Providers: BankingDealers.co.za  This test is not yet approved or  cleared by the [place] FDA and has been authorized for detection and/or diagnosis of SARS-CoV-2 by FDA under an Emergency Use Authorization (EUA).  This EUA will remain in effect (meaning this test can be used) for the duration of the COVID-19 declaration under Section 564(b)(1) of the Act, 21 U.S.C. section 360bbb-3(b)(1), unless the authorization is terminated or revoked sooner.  Performed at [facility], [address] [place], [place] [address]   MRSA PCR Screening     Status: None   Collection Time: 03/07/20  7:42 PM   Specimen: Nasopharyngeal  Result Value Ref Range Status   MRSA by PCR NEGATIVE NEGATIVE Final    Comment:        The GeneXpert MRSA Assay (FDA approved for NASAL specimens only), is one component of a comprehensive MRSA colonization surveillance program. It is not intended to diagnose MRSA infection nor to guide or monitor treatment for MRSA infections. Performed at [facility], [place] [address], [place], [place] [address]    Time coordinating discharge: 35 minutes  SIGNED:  Kerney Elbe, DO [facility] 03/09/2020, 7:28 PM Pager is on [place]  If 7PM-7AM, please contact night-coverage [email or web address]

## 2020-03-09 NOTE — Progress Notes (Signed)
Occupational Therapy Treatment Patient Details Name: Joshua Hess MRN: [id number] DOB: 1940/11/03 Today's Date: 03/09/2020    History of present illness Patient is a 79 y/o male with PMH of severe AS s/p tissue AVR April 2020, mild nonobstructive CAD, sinus bradycardia, CAD, HOH presenting to [facility] for evaluation of new onset R facial droop. CT negative for acute intracranial abnormality.    OT comments  Patient continues to make progress towards goals in skilled OT session. Patient's session encompassed administration of pill box test in order to assess overall cognition in order to complete higher level cognitive tasks. Pt participatory and eager to attempt, and was able to read out all instructions on pill box, however was unable to complete on his own volition.  Pt would read the direction as indicated on the bottle, then attempt to place in pill box, and require assist from therapist in order to complete. (Pt unable to open pill bottles initially despite large directions on top of bottle to instruct) Despite acknowledging medication needed to be done for the entire week, pt would place the medication in for one day,then move onto next medication and place in the next day slot. Pt asking for clarification and cues throughout the entire test, and due to inability to complete, test was stopped after attempting two different medications (pt with multiple errors throughout even though he only did one day per medication). Therapist spoke with partner at end of session via phone, who is aware of pt's current STM deficits, stating that he manages all of pt's medication, and has the remainder of the medication stowed in order to prevent pt from taking too many. Partner also stated that pt was to be receiving higher level cognitive therapy prior to this hospital admission, which they will re-explore once this current situation clears. Discharge remains appropriate at this time, will continue  to follow acutely.    Follow Up Recommendations  Supervision/Assistance - 24 hour;Outpatient OT    Equipment Recommendations  None recommended by OT    Recommendations for Other Services      Precautions / Restrictions Precautions Precautions: Fall Precaution Comments: orthostatic BP but asymptomatic Restrictions Weight Bearing Restrictions: No       Mobility Bed Mobility                  Transfers                      Balance   Sitting-balance support: No upper extremity supported;Feet supported Sitting balance-Leahy Scale: Good                                     ADL either performed or assessed with clinical judgement   ADL Overall ADL's : Needs assistance/impaired                                       General ADL Comments: Session focus on higher level cognitive task with pill box test     Vision       Perception     Praxis      Cognition Arousal/Alertness: Awake/alert Behavior During Therapy: Anxious;WFL for tasks assessed/performed (tremulous with activity and self report of anxious) Overall Cognitive Status: History of cognitive impairments - at baseline Area of Impairment: Memory;Following commands;Problem solving;Awareness;Safety/judgement  Memory: Decreased short-term memory Following Commands: Follows one step commands with increased time Safety/Judgement: Decreased awareness of deficits Awareness: Emergent Problem Solving: Slow processing;Difficulty sequencing;Requires verbal cues;Decreased initiation General Comments: Pill box test administered, unable to complete as pt would read the direction, then attempt to place in pill box, and require assist from therapist in order to complete, despite acknowledging medication needed to be done for the entire week, pt would place the medication in for one day,then move onto next medication and place in the next day slot         Exercises     Shoulder Instructions       General Comments      Pertinent Vitals/ Pain       Pain Assessment: No/denies pain  Home Living                                          Prior Functioning/Environment              Frequency  Min 2X/week        Progress Toward Goals  OT Goals(current goals can now be found in the care plan section)  Progress towards OT goals: Progressing toward goals  Acute Rehab OT Goals Patient Stated Goal: to get home  OT Goal Formulation: With patient Time For Goal Achievement: 03/20/20 Potential to Achieve Goals: Good  Plan Discharge plan remains appropriate    Co-evaluation                 AM-PAC OT "6 Clicks" Daily Activity     Outcome Measure   Help from another person eating meals?: None Help from another person taking care of personal grooming?: A Little Help from another person toileting, which includes using toliet, bedpan, or urinal?: A Little Help from another person bathing (including washing, rinsing, drying)?: A Little Help from another person to put on and taking off regular upper body clothing?: A Little Help from another person to put on and taking off regular lower body clothing?: A Little 6 Click Score: 19    End of Session Equipment Utilized During Treatment: Other (comment) (Pill box test)  OT Visit Diagnosis: Other abnormalities of gait and mobility (R26.89);Muscle weakness (generalized) (M62.81);Other symptoms and signs involving cognitive function   Activity Tolerance Patient tolerated treatment well   Patient Left in bed;with call bell/phone within reach   Nurse Communication Mobility status (Sitting EOB waiting for partner)        Time: 0109-3235 OT Time Calculation (min): 17 min  Charges: OT General Charges $OT Visit: 1 Visit OT Treatments $Self Care/Home Management : 8-22 mins   Corinne Ports E. Lourie Retz, COTA/L Acute Rehabilitation  Services [place] 03/09/2020, 11:05 AM

## 2020-03-09 NOTE — Progress Notes (Signed)
D/c tele and IV. Went over AVS with pt and his partner and all questions were answered.   Lavenia Atlas, RN

## 2020-03-11 ENCOUNTER — Encounter (HOSPITAL_COMMUNITY): Payer: Self-pay | Admitting: Vascular Surgery

## 2020-03-14 ENCOUNTER — Ambulatory Visit: Payer: Medicare HMO | Admitting: Neurology

## 2020-03-19 DIAGNOSIS — I6523 Occlusion and stenosis of bilateral carotid arteries: Secondary | ICD-10-CM | POA: Diagnosis not present

## 2020-03-20 DIAGNOSIS — M791 Myalgia, unspecified site: Secondary | ICD-10-CM | POA: Diagnosis not present

## 2020-03-20 DIAGNOSIS — D692 Other nonthrombocytopenic purpura: Secondary | ICD-10-CM | POA: Diagnosis not present

## 2020-03-20 DIAGNOSIS — R251 Tremor, unspecified: Secondary | ICD-10-CM | POA: Diagnosis not present

## 2020-03-20 DIAGNOSIS — Z8673 Personal history of transient ischemic attack (TIA), and cerebral infarction without residual deficits: Secondary | ICD-10-CM | POA: Diagnosis not present

## 2020-03-20 DIAGNOSIS — G8191 Hemiplegia, unspecified affecting right dominant side: Secondary | ICD-10-CM | POA: Diagnosis not present

## 2020-03-20 DIAGNOSIS — F419 Anxiety disorder, unspecified: Secondary | ICD-10-CM | POA: Diagnosis not present

## 2020-03-20 DIAGNOSIS — N5319 Other ejaculatory dysfunction: Secondary | ICD-10-CM | POA: Diagnosis not present

## 2020-03-27 ENCOUNTER — Encounter: Payer: Self-pay | Admitting: Family Medicine

## 2020-03-27 ENCOUNTER — Ambulatory Visit (INDEPENDENT_AMBULATORY_CARE_PROVIDER_SITE_OTHER): Payer: Medicare HMO | Admitting: Family Medicine

## 2020-03-27 ENCOUNTER — Other Ambulatory Visit: Payer: Self-pay

## 2020-03-27 VITALS — BP 132/88 | HR 59 | Ht 69.0 in | Wt 173.0 lb

## 2020-03-27 DIAGNOSIS — M25512 Pain in left shoulder: Secondary | ICD-10-CM | POA: Diagnosis not present

## 2020-03-27 DIAGNOSIS — M791 Myalgia, unspecified site: Secondary | ICD-10-CM

## 2020-03-27 DIAGNOSIS — Z789 Other specified health status: Secondary | ICD-10-CM | POA: Diagnosis not present

## 2020-03-27 DIAGNOSIS — M25511 Pain in right shoulder: Secondary | ICD-10-CM | POA: Diagnosis not present

## 2020-03-27 MED ORDER — PITAVASTATIN CALCIUM 4 MG PO TABS: 1.0000 | ORAL_TABLET | Freq: Every day | ORAL | 3 refills | Status: DC

## 2020-03-27 NOTE — Progress Notes (Signed)
Subjective:   I, Joshua Hess, am serving as a scribe for Dr. Hulan Saas. This visit occurred during the SARS-CoV-2 public health emergency.  Safety protocols were in place, including screening questions prior to the visit, additional usage of staff PPE, and extensive cleaning of exam room while observing appropriate contact time as indicated for disinfecting solutions.   I'm seeing this patient as a consultation for:  Dr. Shelia Media. Note will be routed back to referring provider/PCP.  CC: Myalgia  HPI: Pt is a 79 y/o male presenting w/ c/o myalgia.  He specifically locates his symptoms to his quads and calves and shoulders. Performs run walks and is not able to stand up straight, leaning towards the right. Runs daily. He has a hx of CVA with the most recent incident being on 03/05/20.  He notes he has been having daily multiple muscle myalgia for months now.  He was started on statins a few months ago and thinks that his pain started about that time.  In the last year he has had a stroke aortic valvular replacement and most recently a carotid endarterectomy.  He is on Crestor 20 for cholesterol lowering and on Plavix for antiplatelet.  He continues to run most days of the week.  Yesterday he ran 5 miles.  He has not had a trial of physical therapy since his stroke.  Past medical history, Surgical history, Family history, Social history, Allergies, and medications have been entered into the medical record, reviewed.   Review of Systems: No new headache, visual changes, nausea, vomiting, diarrhea, constipation, dizziness, abdominal pain, skin rash, fevers, chills, night sweats, weight loss, swollen lymph nodes, body aches, joint swelling, muscle aches, chest pain, shortness of breath, mood changes, visual or auditory hallucinations.   Objective:    Vitals:   03/27/20 1013  BP: 132/88  Pulse: (!) 59  SpO2: 98%   General: Well Developed, well nourished, and in no acute distress.    Neuro/Psych: Alert and oriented x3, extra-ocular muscles intact, able to move all 4 extremities, sensation grossly intact. Skin: Warm and dry, no rashes noted.  Respiratory: Not using accessory muscles, speaking in full sentences, trachea midline.  Cardiovascular: Pulses palpable, no extremity edema. Abdomen: Does not appear distended. MSK:  L-spine normal-appearing nontender normal lumbar motion.  Lower extremity strength reflexes sensation are intact. Upper extremity strength is intact.  Lab and Radiology Results Lab Results  Component Value Date   CHOL 109 03/06/2020   HDL 47 03/06/2020   LDLCALC 52 03/06/2020   TRIG 49 03/06/2020   CHOLHDL 2.3 03/06/2020   Lab Results  Component Value Date   TSH 3.435 03/06/2020     Impression and Recommendations:    Assessment and Plan: 79 y.o. male with myalgias.  My main concern is that he is experiencing statin myalgia.  Clearly it is not very severe but it is bothersome enough that it is significantly affecting his quality of life.  He currently takes rosuvastatin 20 mg daily.  I agree this is a good choice for him however I am concerned he is having trouble tolerating it.  Ideally next step would be Pitavastatin by Repatha or Praluent.  Normally in situations like this I would advise patient to simply stop his statin for 2 weeks and see how he feels however I would very much like him to avoid stopping a statin.  Will prescribe Pitavastatin now.  I have also written a letter to his PCP.  We may need some help getting  this medication approved.  Addition will refer to physical therapy.  Recheck in about a month.  PDMP not reviewed this encounter. Orders Placed This Encounter  Procedures  . Ambulatory referral to Physical Therapy    Referral Priority:   Routine    Referral Type:   Physical Medicine    Referral Reason:   Specialty Services Required    Requested Specialty:   Physical Therapy   Meds ordered this encounter  Medications   . Pitavastatin Calcium 4 MG TABS    Sig: Take 1 tablet (4 mg total) by mouth daily.    Dispense:  30 tablet    Refill:  3    Failed prior statin Crestor with intolerable side effects.    Discussed warning signs or symptoms. Please see discharge instructions. Patient expresses understanding.   The above documentation has been reviewed and is accurate and complete Joshua Hess, M.D.

## 2020-03-27 NOTE — Patient Instructions (Addendum)
Thank you for coming in today. I am worried about the statin medicine (Crestor) causing Myalgia (statin myalgia).  We will try switching crestor to Livalo which is better tolerated.  We could use Repatha or Praulent as a backup plan.   Also we will try physical therapy.   Please send me the lab results from Dr Pennie Banter visit on the 9th.   Let me know if you have problems with this plan.   Recheck with me in about 1 month.

## 2020-04-01 ENCOUNTER — Encounter: Payer: Self-pay | Admitting: Family Medicine

## 2020-04-01 ENCOUNTER — Encounter: Payer: Self-pay | Admitting: Vascular Surgery

## 2020-04-01 ENCOUNTER — Telehealth: Payer: Self-pay | Admitting: Family Medicine

## 2020-04-01 ENCOUNTER — Other Ambulatory Visit: Payer: Self-pay

## 2020-04-01 ENCOUNTER — Ambulatory Visit (INDEPENDENT_AMBULATORY_CARE_PROVIDER_SITE_OTHER): Payer: Self-pay | Admitting: Vascular Surgery

## 2020-04-01 VITALS — BP 132/77 | HR 54 | Temp 99.2°F | Resp 14 | Ht 69.0 in | Wt 175.0 lb

## 2020-04-01 DIAGNOSIS — I6523 Occlusion and stenosis of bilateral carotid arteries: Secondary | ICD-10-CM

## 2020-04-01 MED ORDER — ZYPITAMAG 4 MG PO TABS: 4.0000 mg | ORAL_TABLET | Freq: Every day | ORAL | 1 refills | Status: DC

## 2020-04-01 NOTE — Progress Notes (Signed)
   Patient name: Joshua Hess MRN: [id number] DOB: 25-Mar-1941 Sex: male  REASON FOR VISIT: Follow-up left carotid endarterectomy on 03/08/2020  HPI: Joshua Hess is a 79 y.o. male here today for follow-up.  He is here today with his husband.  He had a stroke and was admitted to [facility].  Work-up confirmed left carotid stenosis as his most likely cause.  He underwent uneventful endarterectomy on 03/08/2020.  His main disability following stroke was of a right facial droop which has improved somewhat since his hospitalization.  Current Outpatient Medications  Medication Sig Dispense Refill  . ALPRAZolam (XANAX) 0.25 MG tablet Take 0.25 mg by mouth at bedtime as needed for anxiety.    Marland Kitchen amoxicillin (AMOXIL) 500 MG capsule TAKE 4 CAPSULES BY MOUTH ONE TO TWO HOURS PRIOR TO DENTAL APPOINTMENT (Patient taking differently: Take 2,000 mg by mouth See admin instructions. TAKE 4 CAPSULES BY MOUTH ONE TO TWO HOURS PRIOR TO DENTAL APPOINTMENT) 16 capsule 1  . Apoaequorin (PREVAGEN) 10 MG CAPS Take 10 mg by mouth daily.     Marland Kitchen aspirin EC 81 MG EC tablet Take 1 tablet (81 mg total) by mouth daily. Swallow whole. 21 tablet 0  . clopidogrel (PLAVIX) 75 MG tablet Take 1 tablet (75 mg total) by mouth daily. 30 tablet 0  . diazepam (VALIUM) 5 MG tablet Take 5 mg by mouth 2 (two) times daily as needed for anxiety.    . finasteride (PROSCAR) 5 MG tablet Take 1 tablet (5 mg total) by mouth daily. 30 tablet 0  . pantoprazole (PROTONIX) 40 MG tablet Take 1 tablet (40 mg total) by mouth daily. 30 tablet 0  . Pitavastatin Calcium 4 MG TABS Take 1 tablet (4 mg total) by mouth daily. 30 tablet 3  . polyethylene glycol (MIRALAX / GLYCOLAX) 17 g packet Take 17 g by mouth daily. (Patient taking differently: Take 17 g by mouth daily as needed for mild constipation. ) 14 each 0  . rosuvastatin (CRESTOR) 20 MG tablet Take 1 tablet (20 mg total) by mouth daily. 30 tablet 0  . sertraline  (ZOLOFT) 100 MG tablet Take 100 mg by mouth daily.    . sildenafil (REVATIO) 20 MG tablet Take 20 mg by mouth daily as needed (ED).    . traZODone (DESYREL) 100 MG tablet Take 50-100 mg by mouth at bedtime.      No current facility-administered medications for this visit.     PHYSICAL EXAM: There were no vitals filed for this visit.  GENERAL: The patient is a well-nourished male, in no acute distress. The vital signs are documented above. Typical healing ridge from his carotid surgery.  No bruits bilaterally.  MEDICAL ISSUES: Stable 3 weeks status post left carotid endarterectomy for symptomatic disease.  He had questions regarding the numbness around his incision I explained that this is always the case with numbness anterior to the incision and this will improve over time with no specific treatment.  He is released to full activity with no restriction.  He had questions about continuing use of Plavix and I have asked him to address this with neurology.  He does not need to be on Plavix but would could be on 81 mg aspirin only from my standpoint.  I will see him again in 9 months with repeat carotid duplex at that time   Rosetta Posner, MD [facility] Tel [phone] Pager [phone]

## 2020-04-01 NOTE — Addendum Note (Signed)
Addended by: Shary Decamp B on: 04/01/2020 11:28 AM   Modules accepted: Orders

## 2020-04-01 NOTE — Telephone Encounter (Signed)
I received a letter from [place].  He found Zypitamag as a direct alternative to Livalo. Both are pitivastatin.  I sent it to [facility] where it is $90 for 90 days to have a trial run to see if it makes any difference.  My Link Snuffer is that Crestor was causing statin myalgia causing some of his pain.

## 2020-04-02 ENCOUNTER — Other Ambulatory Visit: Payer: Self-pay | Admitting: Neurology

## 2020-04-02 ENCOUNTER — Telehealth: Payer: Self-pay | Admitting: Neurology

## 2020-04-02 MED ORDER — CLOPIDOGREL BISULFATE 75 MG PO TABS
75.0000 mg | ORAL_TABLET | Freq: Every day | ORAL | 4 refills | Status: DC
Start: 2020-04-02 — End: 2020-04-02

## 2020-04-02 NOTE — Progress Notes (Signed)
Patient had TIA episode, status post left carotid endarterectomy with Dacron patch angioplasty March 08, 2020,  Reevaluated by cardiovascular surgeon Dr. Sherren Mocha Early postsurgically on April 01, 2020, who recommended aspirin 81 mg daily.  Planning on repeat ultrasound of carotid artery in 9 months.  Will keep patient on AsA 81mg  alone

## 2020-04-02 NOTE — Telephone Encounter (Signed)
I spoke to [facility] at [place] who voided the prescription for Plavix.   I spoke to the patient who verbalized understanding that he should only continue aspirin 81mg , one tablet daily.

## 2020-04-02 NOTE — Telephone Encounter (Signed)
Patient had TIA episode, status post left carotid endarterectomy with Dacron patch angioplasty March 08, 2020,  Reevaluated by cardiovascular surgeon Dr. Sherren Mocha Early postsurgically on April 01, 2020, who recommended aspirin 81 mg daily.  Planning on repeat ultrasound of carotid artery in 9 months.  Will keep patient on AsA 81mg  alone  Please call his pharmacy to cancel Plavix orders.   clopidogrel (PLAVIX) 75 MG tablet 75 mg, Daily 4 ordered        Summary: Take 1 tablet (75 mg total) by mouth daily., Starting Tue 04/02/2020, Normal  Dose, Route, Frequency: 75 mg, Oral, Daily Start: 04/02/2020 Ord/Sold: 04/02/2020 (O) Report Taking:  Long-term:  [facility] [address] [place], [place] Dose History ChangeDiscontinue     Patient Sig: Take 1 tablet (75 mg total) by mouth daily.     Ordered on: 04/02/2020     Authorized by: Marcial Pacas     Dispense: 90 tablet

## 2020-04-02 NOTE — Addendum Note (Signed)
Addended by: Noberto Retort C on: 04/02/2020 11:41 AM   Modules accepted: Orders

## 2020-04-10 DIAGNOSIS — R972 Elevated prostate specific antigen [PSA]: Secondary | ICD-10-CM | POA: Diagnosis not present

## 2020-04-11 ENCOUNTER — Other Ambulatory Visit: Payer: Self-pay

## 2020-04-11 ENCOUNTER — Ambulatory Visit (INDEPENDENT_AMBULATORY_CARE_PROVIDER_SITE_OTHER): Payer: Medicare HMO | Admitting: Physical Therapy

## 2020-04-11 ENCOUNTER — Encounter: Payer: Self-pay | Admitting: Physical Therapy

## 2020-04-11 DIAGNOSIS — R2681 Unsteadiness on feet: Secondary | ICD-10-CM | POA: Diagnosis not present

## 2020-04-11 DIAGNOSIS — R29898 Other symptoms and signs involving the musculoskeletal system: Secondary | ICD-10-CM

## 2020-04-11 NOTE — Therapy (Signed)
[facility] Physical Therapy [address] [place], [place], [address] Phone: [phone]   Fax:  [phone]  Physical Therapy Evaluation  Patient Details  Name: Joshua Hess MRN: [id number] Date of Birth: [birth year] Referring Provider (PT): Gregor Hams, MD   Encounter Date: 04/11/2020   PT End of Session - 04/11/20 1522    Visit Number 1    Number of Visits 12    Date for PT Re-Evaluation 05/23/20    Authorization Type [employer]    Progress Note Due on Visit 10    PT Start Time 1432    PT Stop Time 1515    PT Time Calculation (min) 43 min    Activity Tolerance Patient tolerated treatment well    Behavior During Therapy [facility] for tasks assessed/performed           Past Medical History:  Diagnosis Date  . Aphasia   . BPH (benign prostatic hyperplasia)   . Cancer ([place])    behind ear- melomona  . Carotid artery stenosis   . Chronotropic incompetence   . ED (erectile dysfunction)   . Elevated PSA   . HOH (hard of hearing)   . Mild CAD   . Plantar fasciitis   . Postoperative atrial fibrillation ([place])   . Severe aortic stenosis    a. s/p tissue AVR 10/2018.  Marland Kitchen Sinus bradycardia    baseline  . Wears glasses     Past Surgical History:  Procedure Laterality Date  . AORTIC VALVE REPLACEMENT N/A 11/10/2018   Procedure: AORTIC VALVE REPLACEMENT (AVR), USING 23MM INSPIRIS;  Surgeon: Gaye Pollack, MD;  Location: [place];  Service: Open Heart Surgery;  Laterality: N/A;  . ENDARTERECTOMY Left 03/08/2020   Procedure: ENDARTERECTOMY CAROTID;  Surgeon: Rosetta Posner, MD;  Location: [place];  Service: Vascular;  Laterality: Left;  . EXTERNAL EAR SURGERY     melanoma removed  . HERNIA REPAIR Right    inguinal  . INGUINAL HERNIA REPAIR Right 11/15/2019   Procedure: LAPAROSCOPIC RIGHT INGUINAL HERNIA REPAIR WITH MESH;  Surgeon: Coralie Keens, MD;  Location: [place];  Service: General;  Laterality: Right;  . RIGHT/LEFT HEART CATH AND CORONARY ANGIOGRAPHY N/A  09/29/2018   Procedure: RIGHT/LEFT HEART CATH AND CORONARY ANGIOGRAPHY;  Surgeon: Burnell Blanks, MD;  Location: [place] CV [facility];  Service: Cardiovascular;  Laterality: N/A;  . TEE WITHOUT CARDIOVERSION N/A 11/10/2018   Procedure: TRANSESOPHAGEAL ECHOCARDIOGRAM (TEE);  Surgeon: Gaye Pollack, MD;  Location: [place];  Service: Open Heart Surgery;  Laterality: N/A;  . TRANSCATHETER AORTIC VALVE REPLACEMENT, TRANSFEMORAL     PROCEDURE ATTEMPTED & ABORTED   . UMBILICAL HERNIA REPAIR N/A 11/15/2019   Procedure: UMBILICAL HERNIA REPAIR;  Surgeon: Coralie Keens, MD;  Location: [place];  Service: General;  Laterality: N/A;    There were no vitals filed for this visit.    Subjective Assessment - 04/11/20 1438    Subjective Pt is a 79 y/o male who presents to OPPT for generalized pain.  About 1.5 years ago, he had aortic valve replacement, hernia repair with sepsis complications, recent CVA in 2021 s/p carotoid endartecetomy.  Pt's husband provides the history, and reports generalized deconditioning and fatigue.    Patient is accompained by: Family member   Mark   Patient Stated Goals ability to run a marathon    Currently in Pain? No/denies              [facility] PT Assessment - 04/11/20 1442  Assessment   Medical Diagnosis M79.10 (ICD-10-CM) - Myalgia    Referring Provider (PT) Gregor Hams, MD    Onset Date/Surgical Date --   1.5 years ago   Hand Dominance Right    Next MD Visit 04/25/20    Prior Therapy cardiac rehab      Precautions   Precautions Fall      Restrictions   Weight Bearing Restrictions No      Balance Screen   Has the patient fallen in the past 6 months No   few stumbles - no falls   Has the patient had a decrease in activity level because of a fear of falling?  No    Is the patient reluctant to leave their home because of a fear of falling?  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      ROM / Strength   AROM / PROM / Strength Strength      Strength   Overall Strength Comments bil LEs grossly 5/5      Transfers   Five time sit to stand comments  9.37 sec without UE support      Ambulation/Gait   Gait Comments amb with slightly larger BOS, otherwise independent without significant deviations      High Level Balance   High Level Balance Comments feet together with EC on compliant surface: < 4 sec and pt opens eyes      Functional Gait  Assessment   Gait assessed  Yes    Gait Level Surface Walks 20 ft in less than 7 sec but greater than 5.5 sec, uses assistive device, slower speed, mild gait deviations, or deviates 6-10 in outside of the 12 in walkway width.    Change in Gait Speed Able to smoothly change walking speed without loss of balance or gait deviation. Deviate no more than 6 in outside of the 12 in walkway width.    Gait with Horizontal Head Turns Performs head turns smoothly with slight change in gait velocity (eg, minor disruption to smooth gait path), deviates 6-10 in outside 12 in walkway width, or uses an assistive device.    Gait with Vertical Head Turns Performs task with slight change in gait velocity (eg, minor disruption to smooth gait path), deviates 6 - 10 in outside 12 in walkway width or uses assistive device    Gait and Pivot Turn Pivot turns safely within 3 sec and stops quickly with no loss of balance.    Step Over Obstacle Is able to step over 2 stacked shoe boxes taped  together (9 in total height) without changing gait speed. No evidence of imbalance.    Gait with Narrow Base of Support Ambulates 4-7 steps.    Gait with Eyes Closed Walks 20 ft, uses assistive device, slower speed, mild gait deviations, deviates 6-10 in outside 12 in walkway  width. Ambulates 20 ft in less than 9 sec but greater than 7 sec.    Ambulating Backwards Walks 20 ft, slow speed, abnormal gait pattern, evidence for imbalance, deviates 10-15 in outside 12 in walkway width.    Steps Alternating feet, must use rail.    Total Score 21                      Objective measurements completed on examination: See above findings.               PT Education - 04/11/20 1517    Education Details POC    Person(s) Educated Patient    Methods Explanation;Demonstration    Comprehension Verbalized understanding;Returned demonstration               PT Long Term Goals - 04/11/20 1531      PT LONG TERM GOAL #1   Title independent with HEP    Status New    Target Date 05/23/20      PT LONG TERM GOAL #2   Title improve FGA to >/= 25/30 for improved balance and mobility    Status New    Target Date 05/23/20      PT LONG TERM GOAL #3   Title demonstrate improved balance by standing with EC and feet together on compliant surface > 20 sec without LOB    Status New    Target Date 05/23/20                  Plan - 04/11/20 1523    Clinical Impression Statement Pt is a 79 y/o male who presents to OPPT with c/o general myalgias and decreased balance and endurance following multiple medical complications over the past 1.5 years.  Pt also likely with myalgias due to statin intolerance, and reports no change in myalgia (only on x 1 week currently) but significant other feels energy levels are improving.  Pt demonstrates high level balance deficits and will benefit from PT to address deficits listed.    Personal Factors and Comorbidities Comorbidity 3+     Comorbidities aortic valve replacement, hernia repair with complication of sepsis, CVA July 2021 s/p carotid endarterectomy    Examination-Activity Limitations Locomotion Level;Stairs    Examination-Participation Restrictions Community Activity;Yard Work;Other   exercise   Stability/Clinical Decision Making Evolving/Moderate complexity    Clinical Decision Making Moderate    Rehab Potential Good    PT Frequency 2x / week   1-2x/wk   PT Duration 6 weeks    PT Treatment/Interventions ADLs/Self Care Home Management;Cryotherapy;Electrical Stimulation;Moist Heat;Balance training;Therapeutic exercise;Therapeutic activities;Functional mobility training;Stair training;Gait training;Neuromuscular re-education;Patient/family education;Energy conservation    PT Next Visit Plan establish HEP for strengthening (pt already walking/running up to 3 miles a day) and balance (dynamic and compliant surfaces)    Consulted and Agree with Plan of Care Patient           Patient will benefit from skilled therapeutic intervention in order to improve the following deficits and impairments:  Abnormal gait, Decreased balance, Decreased mobility, Decreased endurance, Decreased activity tolerance  Visit Diagnosis: Unsteadiness on feet - Plan: PT plan of care cert/re-cert  Other symptoms and signs involving the musculoskeletal system - Plan: PT plan of care cert/re-cert     Problem List Patient Active Problem List   Diagnosis Date Noted  . Facial droop 03/05/2020  . Mild CAD   . Acute encephalopathy 11/18/2019  . Acute urinary retention 11/18/2019  . Post-operative state 11/18/2019  .  Aphasia 08/07/2019  . S/P AVR (aortic valve replacement)   . Carotid artery stenosis   . Chronotropic incompetence   . Severe aortic stenosis   . Sinus bradycardia 08/12/2018  . Benign prostatic hyperplasia with nocturia 11/01/2012      Laureen Abrahams, PT, DPT 04/11/20 3:35 PM     [facility] [address] [place], [place], [address] Phone: [phone]   Fax:  [phone]  Name: ULYSEES ROBARTS MRN: [id number] Date of Birth: 11-30-1940

## 2020-04-15 ENCOUNTER — Ambulatory Visit (INDEPENDENT_AMBULATORY_CARE_PROVIDER_SITE_OTHER): Payer: Medicare HMO | Admitting: Physical Therapy

## 2020-04-15 ENCOUNTER — Other Ambulatory Visit: Payer: Self-pay

## 2020-04-15 DIAGNOSIS — R29898 Other symptoms and signs involving the musculoskeletal system: Secondary | ICD-10-CM | POA: Diagnosis not present

## 2020-04-15 DIAGNOSIS — R2681 Unsteadiness on feet: Secondary | ICD-10-CM

## 2020-04-15 NOTE — Patient Instructions (Signed)
Access Code: [email or web address] URL: [email or web address] Date: 04/15/2020 Prepared by: Elsie Ra  Exercises Squat with Chair Touch - 2 x daily - 6 x weekly - 2-3 sets - 10 reps Standing Partial Lunge - 2 x daily - 6 x weekly - 2-3 sets - 10 reps Tandem Walking with Counter Support - 2 x daily - 6 x weekly - 3 sets Carioca with Unilateral Counter Support - 2 x daily - 6 x weekly - 1-2 sets - 10 reps Half Tandem Stance Balance with Eyes Closed - 2 x daily - 6 x weekly - 3 reps - 1 sets - 30 sec hold

## 2020-04-15 NOTE — Therapy (Signed)
[facility] Physical Therapy [address] [place], [place], [address] Phone: [phone]   Fax:  [phone]  Physical Therapy Treatment  Patient Details  Name: Joshua Hess MRN: [id number] Date of Birth: 17-Dec-1940 Referring Provider (PT): Gregor Hams, MD   Encounter Date: 04/15/2020   PT End of Session - 04/15/20 1205    Visit Number 2    Number of Visits 12    Date for PT Re-Evaluation 05/23/20    Authorization Type [employer]    Progress Note Due on Visit 10    PT Start Time 1100    PT Stop Time 1147    PT Time Calculation (min) 47 min    Activity Tolerance Patient tolerated treatment well    Behavior During Therapy [facility] for tasks assessed/performed           Past Medical History:  Diagnosis Date  . Aphasia   . BPH (benign prostatic hyperplasia)   . Cancer ([place])    behind ear- [place]ona  . Carotid artery stenosis   . Chronotropic incompetence   . ED (erectile dysfunction)   . Elevated PSA   . HOH (hard of hearing)   . Mild CAD   . Plantar fasciitis   . Postoperative atrial fibrillation ([place])   . Severe aortic stenosis    a. s/p tissue AVR 10/2018.  Marland Kitchen Sinus bradycardia    baseline  . Wears glasses     Past Surgical History:  Procedure Laterality Date  . AORTIC VALVE REPLACEMENT N/A 11/10/2018   Procedure: AORTIC VALVE REPLACEMENT (AVR), USING 23MM INSPIRIS;  Surgeon: Gaye Pollack, MD;  Location: [place];  Service: Open Heart Surgery;  Laterality: N/A;  . ENDARTERECTOMY Left 03/08/2020   Procedure: ENDARTERECTOMY CAROTID;  Surgeon: Rosetta Posner, MD;  Location: [place];  Service: Vascular;  Laterality: Left;  . EXTERNAL EAR SURGERY     melanoma removed  . HERNIA REPAIR Right    inguinal  . INGUINAL HERNIA REPAIR Right 11/15/2019   Procedure: LAPAROSCOPIC RIGHT INGUINAL HERNIA REPAIR WITH MESH;  Surgeon: Coralie Keens, MD;  Location: [place];  Service: General;  Laterality: Right;  . RIGHT/LEFT HEART CATH AND CORONARY ANGIOGRAPHY N/A  09/29/2018   Procedure: RIGHT/LEFT HEART CATH AND CORONARY ANGIOGRAPHY;  Surgeon: Burnell Blanks, MD;  Location: [place] CV [facility];  Service: Cardiovascular;  Laterality: N/A;  . TEE WITHOUT CARDIOVERSION N/A 11/10/2018   Procedure: TRANSESOPHAGEAL ECHOCARDIOGRAM (TEE);  Surgeon: Gaye Pollack, MD;  Location: [place];  Service: Open Heart Surgery;  Laterality: N/A;  . TRANSCATHETER AORTIC VALVE REPLACEMENT, TRANSFEMORAL     PROCEDURE ATTEMPTED & ABORTED   . UMBILICAL HERNIA REPAIR N/A 11/15/2019   Procedure: UMBILICAL HERNIA REPAIR;  Surgeon: Coralie Keens, MD;  Location: [place];  Service: General;  Laterality: N/A;    There were no vitals filed for this visit.   Subjective Assessment - 04/15/20 1201    Subjective relays he feels dizzy today but this is not new and something he was been dealing with for a long time. Relays general fatigue but not pain today.    Patient is accompained by: Family member   Mark   Patient Stated Goals ability to run a marathon              [facility] Adult PT Treatment/Exercise - 04/15/20 0001      Neuro Re-ed    Neuro Re-ed Details  Balance in bars on foam pad fwd walking and lateral walking up/down X3. balance  on foam feet together eyes closed 30 sec X 4, braided walking up/down X 3 trips      Exercises   Exercises Knee/Hip;Shoulder      Knee/Hip Exercises: Aerobic   Recumbent Bike L5 X 10 min      Knee/Hip Exercises: Machines for Strengthening   Cybex Leg Press DL 125 lbs 3X10, SL 75 lbs 2X10 ea side    Other Machine row machine 45 lbs 2X15 and lat pull machine 45 lbs 3X10      Knee/Hip Exercises: Standing   Other Standing Knee Exercises TRX squats 3X10, chest press 3X10, lunges 2X10                  PT Education - 04/15/20 1204    Education Details HEP    Person(s) Educated Patient;Spouse    Methods Explanation;Demonstration;Verbal cues;Handout    Comprehension Verbalized understanding;Returned demonstration                PT Long Term Goals - 04/11/20 1531      PT LONG TERM GOAL #1   Title independent with HEP    Status New    Target Date 05/23/20      PT LONG TERM GOAL #2   Title improve FGA to >/= 25/30 for improved balance and mobility    Status New    Target Date 05/23/20      PT LONG TERM GOAL #3   Title demonstrate improved balance by standing with EC and feet together on compliant surface > 20 sec without LOB    Status New    Target Date 05/23/20                 Plan - 04/15/20 1206    Clinical Impression Statement Session focused on HEP creation and review for general leg strength, and balance. He showed good understanding and return demonstration with this. Continue POC with foucus on balance, coordincation,strength and endurance.    Personal Factors and Comorbidities Comorbidity 3+    Comorbidities aortic valve replacement, hernia repair with complication of sepsis, CVA July 2021 s/p carotid endarterectomy    Examination-Activity Limitations Locomotion Level;Stairs    Examination-Participation Restrictions Community Activity;Yard Work;Other   exercise   Stability/Clinical Decision Making Evolving/Moderate complexity    Rehab Potential Good    PT Frequency 2x / week   1-2x/wk   PT Duration 6 weeks    PT Treatment/Interventions ADLs/Self Care Home Management;Cryotherapy;Electrical Stimulation;Moist Heat;Balance training;Therapeutic exercise;Therapeutic activities;Functional mobility training;Stair training;Gait training;Neuromuscular re-education;Patient/family education;Energy conservation    PT Next Visit Plan progress strenth, endurance, balance, coodination as able.    PT Home Exercise Plan Access Code: [email or web address]    Consulted and Agree with Plan of Care Patient           Patient will benefit from skilled therapeutic intervention in order to improve the following deficits and impairments:  Abnormal gait, Decreased balance, Decreased mobility, Decreased endurance,  Decreased activity tolerance  Visit Diagnosis: Unsteadiness on feet  Other symptoms and signs involving the musculoskeletal system     Problem List Patient Active Problem List   Diagnosis Date Noted  . Facial droop 03/05/2020  . Mild CAD   . Acute encephalopathy 11/18/2019  . Acute urinary retention 11/18/2019  . Post-operative state 11/18/2019  . Aphasia 08/07/2019  . S/P AVR (aortic valve replacement)   . Carotid artery stenosis   . Chronotropic incompetence   . Severe aortic stenosis   . Sinus bradycardia 08/12/2018  . Benign prostatic  hyperplasia with nocturia 11/01/2012    Silvestre Mesi 04/15/2020, 12:12 PM  [facility] Physical Therapy [address] [place], [place], [address] Phone: [phone]   Fax:  [phone]  Name: Joshua Hess MRN: [id number] Date of Birth: 06-Mar-1941

## 2020-04-17 ENCOUNTER — Encounter: Payer: Self-pay | Admitting: Rehabilitative and Restorative Service Providers"

## 2020-04-17 ENCOUNTER — Ambulatory Visit (INDEPENDENT_AMBULATORY_CARE_PROVIDER_SITE_OTHER): Payer: Medicare HMO | Admitting: Rehabilitative and Restorative Service Providers"

## 2020-04-17 ENCOUNTER — Other Ambulatory Visit: Payer: Self-pay

## 2020-04-17 DIAGNOSIS — R2681 Unsteadiness on feet: Secondary | ICD-10-CM | POA: Diagnosis not present

## 2020-04-17 DIAGNOSIS — R262 Difficulty in walking, not elsewhere classified: Secondary | ICD-10-CM

## 2020-04-17 DIAGNOSIS — M6281 Muscle weakness (generalized): Secondary | ICD-10-CM | POA: Diagnosis not present

## 2020-04-17 DIAGNOSIS — N401 Enlarged prostate with lower urinary tract symptoms: Secondary | ICD-10-CM | POA: Diagnosis not present

## 2020-04-17 DIAGNOSIS — R972 Elevated prostate specific antigen [PSA]: Secondary | ICD-10-CM | POA: Diagnosis not present

## 2020-04-17 DIAGNOSIS — R351 Nocturia: Secondary | ICD-10-CM | POA: Diagnosis not present

## 2020-04-17 DIAGNOSIS — N5201 Erectile dysfunction due to arterial insufficiency: Secondary | ICD-10-CM | POA: Diagnosis not present

## 2020-04-17 NOTE — Patient Instructions (Signed)
Access Code: [email or web address] URL: [email or web address] Date: 04/17/2020 Prepared by: Vista Mink  Exercises Sidelying Hip Abduction - 2 x daily - 7 x weekly - 2-3 sets - 10 reps - 3 seconds hold Prone Hip Extension - 2 x daily - 7 x weekly - 2-3 sets - 10 reps - 3 seconds hold Standing Tandem Balance with Counter Support - 2 x daily - 7 x weekly - 1 sets - 5 reps - 20 seconds hold

## 2020-04-17 NOTE — Therapy (Signed)
[facility] Therapy [address] [place], [place], [address] Phone: [phone]   Fax:  [phone]  Physical Therapy Treatment  Patient Details  Name: Joshua Hess MRN: [id number] Date of Birth: 05/25/1941 Referring Provider (PT): [place] Hams, MD   Encounter Date: 04/17/2020   PT End of Session - 04/17/20 1328    Visit Number 3    Number of Visits 12    Date for PT Re-Evaluation 05/23/20    Authorization Type [employer]    Progress Note Due on Visit 10    PT Start Time 1104    PT Stop Time 1148    PT Time Calculation (min) 44 min    Activity Tolerance Patient tolerated treatment well    Behavior During Therapy [facility] for tasks assessed/performed           Past Medical History:  Diagnosis Date  . Aphasia   . BPH (benign prostatic hyperplasia)   . Cancer ([place])    behind ear- melomona  . Carotid artery stenosis   . Chronotropic incompetence   . ED (erectile dysfunction)   . Elevated PSA   . HOH (hard of hearing)   . Mild CAD   . Plantar fasciitis   . Postoperative atrial fibrillation ([place])   . Severe aortic stenosis    a. s/p tissue AVR 10/2018.  Marland Kitchen Sinus bradycardia    baseline  . Wears glasses     Past Surgical History:  Procedure Laterality Date  . AORTIC VALVE REPLACEMENT N/A 11/10/2018   Procedure: AORTIC VALVE REPLACEMENT (AVR), USING 23MM INSPIRIS;  Surgeon: [place] Pollack, MD;  Location: [place];  Service: Open Heart Surgery;  Laterality: N/A;  . ENDARTERECTOMY Left 03/08/2020   Procedure: ENDARTERECTOMY CAROTID;  Surgeon: [place] Posner, MD;  Location: [place];  Service: Vascular;  Laterality: Left;  . EXTERNAL EAR SURGERY     melanoma removed  . HERNIA REPAIR Right    inguinal  . INGUINAL HERNIA REPAIR Right 11/15/2019   Procedure: LAPAROSCOPIC RIGHT INGUINAL HERNIA REPAIR WITH MESH;  Surgeon: [place] Keens, MD;  Location: [place];  Service: General;  Laterality: Right;  . RIGHT/LEFT HEART CATH AND CORONARY ANGIOGRAPHY N/A  09/29/2018   Procedure: RIGHT/LEFT HEART CATH AND CORONARY ANGIOGRAPHY;  Surgeon: [place] Blanks, MD;  Location: [place] [facility];  Service: Cardiovascular;  Laterality: N/A;  . TEE WITHOUT CARDIOVERSION N/A 11/10/2018   Procedure: TRANSESOPHAGEAL ECHOCARDIOGRAM (TEE);  Surgeon: [place] Pollack, MD;  Location: [place];  Service: Open Heart Surgery;  Laterality: N/A;  . TRANSCATHETER AORTIC VALVE REPLACEMENT, TRANSFEMORAL     PROCEDURE ATTEMPTED & ABORTED   . UMBILICAL HERNIA REPAIR N/A 11/15/2019   Procedure: UMBILICAL HERNIA REPAIR;  Surgeon: [place] Keens, MD;  Location: [place];  Service: General;  Laterality: N/A;    There were no vitals filed for this visit.   Subjective Assessment - 04/17/20 1325    Subjective Joshua Hess is very motivated to return to running long distances.  He notes pain/soreness of the low back and hip abductors with running currently.    Patient is accompained by: Family member   [facility] [facility]   Patient Stated Goals ability to run a marathon    Currently in Pain? No/denies    Multiple Pain Sites No                             [facility] Adult PT Treatment/Exercise - 04/17/20 0001  Neuro Re-ed    Neuro Re-ed Details  Tandem balance eyes closed 3X 30 seconds (3X each foot in front) and feet together eyes closed 30 seconds      Exercises   Exercises Lumbar;Knee/Hip      Lumbar Exercises: Standing   Other Standing Lumbar Exercises Standing hip abduction with pelvic stabilization multiple attempts to get technique correct (no lateral lean, correct foot position, correct posture)   Took a long time.  Patient became frustrated so we moved on.     Lumbar Exercises: Sidelying   Hip Abduction Both;10 reps;3 seconds   2 sets 1/4 turn towards stomach     Lumbar Exercises: Prone   Straight Leg Raise 10 reps;3 seconds   2 sets alternating hip extensions     Knee/Hip Exercises: Aerobic   Recumbent Bike Next visit      Knee/Hip Exercises: Machines for  Strengthening   Cybex Leg Press Next visit    Other Machine Next visit      Knee/Hip Exercises: Standing   Other Standing Knee Exercises Next visit                  PT Education - 04/17/20 1326    Education Details Spent a lot of time discussing running mechanics and the importance of strong hip abductors and extensors.  Progressed strength and balance with additions to his HEP.    Person(s) Educated Patient;Spouse    Methods Explanation;Demonstration;Tactile cues;Verbal cues;Handout    Comprehension Tactile cues required;Verbalized understanding;Returned demonstration;Need further instruction;Verbal cues required               PT Long Term Goals - 04/17/20 1327      PT LONG TERM GOAL #1   Title independent with HEP    Status On-going      PT LONG TERM GOAL #2   Title improve FGA to >/= 25/30 for improved balance and mobility    Status On-going      PT LONG TERM GOAL #3   Title demonstrate improved balance by standing with EC and feet together on compliant surface > 20 sec without LOB    Status On-going                 Plan - 04/17/20 1328    Clinical Impression Statement We worked primarily on hip abductors, hip extensors and low back strengthening to complement Joshua Hess's HEP.  We will tie it all together next visit to review and answer any questions.  Focus on previously mentioned areas was due to Joshua Hess mentioning pain limiting his long distance running and his strong desire to return to this.  Continue to address balance, general leg strength and more specific hip abductor and extensor stretch to meet LTGs.    Personal Factors and Comorbidities Comorbidity 3+    Comorbidities aortic valve replacement, hernia repair with complication of sepsis, CVA July 2021 s/p carotid endarterectomy    Examination-Activity Limitations Locomotion Level;Stairs    Examination-Participation Restrictions Community Activity;Yard Work;Other   exercise   Stability/Clinical Decision  Making Evolving/Moderate complexity    Rehab Potential Good    PT Frequency 2x / week   1-2x/wk   PT Duration 6 weeks    PT Treatment/Interventions ADLs/Self Care Home Management;Cryotherapy;Electrical Stimulation;Moist Heat;Balance training;Therapeutic exercise;Therapeutic activities;Functional mobility training;Stair training;Gait training;Neuromuscular re-education;Patient/family education;Energy conservation    PT Next Visit Plan progress strenth, endurance, balance, coodination as able.    PT Home Exercise Plan Access Code: [email or web address].  Access Code: [email or web address]  Consulted and Agree with Plan of Care Patient;Family member/caregiver    Family Member Consulted Partner           Patient will benefit from skilled therapeutic intervention in order to improve the following deficits and impairments:  Abnormal gait, Decreased balance, Decreased mobility, Decreased endurance, Decreased activity tolerance  Visit Diagnosis: Unsteadiness on feet  Difficulty in walking, not elsewhere classified  Muscle weakness (generalized)     Problem List Patient Active Problem List   Diagnosis Date Noted  . Facial droop 03/05/2020  . Mild CAD   . Acute encephalopathy 11/18/2019  . Acute urinary retention 11/18/2019  . Post-operative state 11/18/2019  . Aphasia 08/07/2019  . S/P AVR (aortic valve replacement)   . Carotid artery stenosis   . Chronotropic incompetence   . Severe aortic stenosis   . Sinus bradycardia 08/12/2018  . Benign prostatic hyperplasia with nocturia 11/01/2012    Farley Ly PT, MPT 04/17/2020, 1:33 PM  [facility] [address] [place], [place], [address] Phone: [phone]   Fax:  [phone]  Name: [place] Hess MRN: [id number] Date of Birth: 1940-10-23

## 2020-04-22 ENCOUNTER — Other Ambulatory Visit: Payer: Self-pay

## 2020-04-22 ENCOUNTER — Encounter: Payer: Self-pay | Admitting: Rehabilitative and Restorative Service Providers"

## 2020-04-22 ENCOUNTER — Ambulatory Visit: Payer: Medicare HMO | Admitting: Rehabilitative and Restorative Service Providers"

## 2020-04-22 DIAGNOSIS — M6281 Muscle weakness (generalized): Secondary | ICD-10-CM | POA: Diagnosis not present

## 2020-04-22 DIAGNOSIS — R2681 Unsteadiness on feet: Secondary | ICD-10-CM | POA: Diagnosis not present

## 2020-04-22 DIAGNOSIS — R29898 Other symptoms and signs involving the musculoskeletal system: Secondary | ICD-10-CM | POA: Diagnosis not present

## 2020-04-22 DIAGNOSIS — R262 Difficulty in walking, not elsewhere classified: Secondary | ICD-10-CM

## 2020-04-22 NOTE — Patient Instructions (Signed)
Access Code: [facility] URL: [email or web address] Date: 04/22/2020 Prepared by: Vista Mink  Exercises Walking Tandem Stance - 2 x daily - 7 x weekly - 1 sets - 1 reps - 3-5 minutes hold Tandem Stance - 2 x daily - 7 x weekly - 1 sets - 5 reps - 30 seconds hold

## 2020-04-22 NOTE — Therapy (Signed)
[facility] Physical Therapy [address] [place], [place], [address] Phone: [phone]   Fax:  [phone]  Physical Therapy Treatment  Patient Details  Name: [place] Hess MRN: [id number] Date of Birth: Apr 14, 1941 Referring Provider (PT): [place] Hams, MD   Encounter Date: 04/22/2020   PT End of Session - 04/22/20 1638    Visit Number 4    Number of Visits 12    Date for PT Re-Evaluation 05/23/20    Authorization Type [employer]    Progress Note Due on Visit 10    PT Start Time 1301    PT Stop Time 1344    PT Time Calculation (min) 43 min    Activity Tolerance Patient tolerated treatment well    Behavior During Therapy [facility] for tasks assessed/performed           Past Medical History:  Diagnosis Date  . Aphasia   . BPH (benign prostatic hyperplasia)   . Cancer ([place])    behind ear- melomona  . Carotid artery stenosis   . Chronotropic incompetence   . ED (erectile dysfunction)   . Elevated PSA   . HOH (hard of hearing)   . Mild CAD   . Plantar fasciitis   . Postoperative atrial fibrillation ([place])   . Severe aortic stenosis    a. s/p tissue AVR 10/2018.  Marland Kitchen Sinus bradycardia    baseline  . Wears glasses     Past Surgical History:  Procedure Laterality Date  . AORTIC VALVE REPLACEMENT N/A 11/10/2018   Procedure: AORTIC VALVE REPLACEMENT (AVR), USING 23MM INSPIRIS;  Surgeon: Gaye Pollack, MD;  Location: [place];  Service: Open Heart Surgery;  Laterality: N/A;  . ENDARTERECTOMY Left 03/08/2020   Procedure: ENDARTERECTOMY CAROTID;  Surgeon: Rosetta Posner, MD;  Location: [place];  Service: Vascular;  Laterality: Left;  . EXTERNAL EAR SURGERY     melanoma removed  . HERNIA REPAIR Right    inguinal  . INGUINAL HERNIA REPAIR Right 11/15/2019   Procedure: LAPAROSCOPIC RIGHT INGUINAL HERNIA REPAIR WITH MESH;  Surgeon: Coralie Keens, MD;  Location: [place];  Service: General;  Laterality: Right;  . RIGHT/LEFT HEART CATH AND CORONARY ANGIOGRAPHY N/A  09/29/2018   Procedure: RIGHT/LEFT HEART CATH AND CORONARY ANGIOGRAPHY;  Surgeon: [place] Blanks, MD;  Location: [place] [facility];  Service: Cardiovascular;  Laterality: N/A;  . TEE WITHOUT CARDIOVERSION N/A 11/10/2018   Procedure: TRANSESOPHAGEAL ECHOCARDIOGRAM (TEE);  Surgeon: [place] Pollack, MD;  Location: [place];  Service: Open Heart Surgery;  Laterality: N/A;  . TRANSCATHETER AORTIC VALVE REPLACEMENT, TRANSFEMORAL     PROCEDURE ATTEMPTED & ABORTED   . UMBILICAL HERNIA REPAIR N/A 11/15/2019   Procedure: UMBILICAL HERNIA REPAIR;  Surgeon: Coralie Keens, MD;  Location: [place];  Service: General;  Laterality: N/A;    There were no vitals filed for this visit.   Subjective Assessment - 04/22/20 1636    Subjective [place] Hess continues to appropriately progress his running at home.  Balance remains a concern.    Patient is accompained by: Family member   [facility]   Patient Stated Goals ability to run a marathon    Currently in Pain? No/denies                             [facility] Adult PT Treatment/Exercise - 04/22/20 0001      Neuro Re-ed    Neuro Re-ed Details  Tandem eyes open; eyes open head  side to side; eyes closed; eyes closed side to side 2X each  30 seconds.  Heel to toe walk; retro heel to toe walk; cariocas 15 minutes      Exercises   Exercises Lumbar;Knee/Hip      Lumbar Exercises: Machines for Strengthening   Leg Press 150# 2 sets of 10 slow eccentrics      Knee/Hip Exercises: Machines for Strengthening   Cybex Leg Press --    Other Machine Pull to chest/Lat pull down 45# 10X slow eccentrics      Knee/Hip Exercises: Standing   Other Standing Knee Exercises TRX squats 2 sets of 10 slow eccentrics                  PT Education - 04/22/20 1637    Education Details Spent a lot of time on static and dynamic balance.    Person(s) Educated Patient;Spouse    Methods Explanation;Demonstration;Tactile cues;Verbal cues;Handout    Comprehension  Verbalized understanding;Need further instruction;Returned demonstration;Verbal cues required;Tactile cues required               PT Long Term Goals - 04/22/20 1637      PT LONG TERM GOAL #1   Title independent with HEP    Status On-going      PT LONG TERM GOAL #2   Title improve FGA to >/= 25/30 for improved balance and mobility    Status On-going      PT LONG TERM GOAL #3   Title demonstrate improved balance by standing with EC and feet together on compliant surface > 20 sec without LOB    Status On-going                 Plan - 04/22/20 1638    Clinical Impression Statement Heavy emphasis on balance today as Joshua Hess has been doing a great job of progressing his run/walk at home without pain.  He is most concerned about his balance if he hits a crack or uneven spot on the pavement when running.  Progressed his balance challenges at home and this will be a focus of his program at home and in the office.    Personal Factors and Comorbidities Comorbidity 3+    Comorbidities aortic valve replacement, hernia repair with complication of sepsis, CVA July 2021 s/p carotid endarterectomy    Examination-Activity Limitations Locomotion Level;Stairs    Examination-Participation Restrictions Community Activity;Yard Work;Other   exercise   Stability/Clinical Decision Making Evolving/Moderate complexity    Rehab Potential Good    PT Frequency 2x / week   1-2x/wk   PT Duration 6 weeks    PT Treatment/Interventions ADLs/Self Care Home Management;Cryotherapy;Electrical Stimulation;Moist Heat;Balance training;Therapeutic exercise;Therapeutic activities;Functional mobility training;Stair training;Gait training;Neuromuscular re-education;Patient/family education;Energy conservation    PT Next Visit Plan progress strenth, endurance, balance, coodination as able.    PT Home Exercise Plan Access Code: [email or web address].  Access Code: [email or web address]    Consulted and Agree with Plan of Care Patient;Family  member/caregiver    Family Member Consulted Partner           Patient will benefit from skilled therapeutic intervention in order to improve the following deficits and impairments:  Abnormal gait, Decreased balance, Decreased mobility, Decreased endurance, Decreased activity tolerance  Visit Diagnosis: Unsteadiness on feet  Difficulty in walking, not elsewhere classified  Muscle weakness (generalized)  Other symptoms and signs involving the musculoskeletal system     Problem List Patient Active Problem List   Diagnosis Date Noted  . Facial droop 03/05/2020  .  Mild CAD   . Acute encephalopathy 11/18/2019  . Acute urinary retention 11/18/2019  . Post-operative state 11/18/2019  . Aphasia 08/07/2019  . S/P AVR (aortic valve replacement)   . Carotid artery stenosis   . Chronotropic incompetence   . Severe aortic stenosis   . Sinus bradycardia 08/12/2018  . Benign prostatic hyperplasia with nocturia 11/01/2012    Farley Ly PT, MPT 04/22/2020, 4:41 PM  [facility] [address] [place], [place], [address] Phone: [phone]   Fax:  [phone]  Name: [place] [facility] MRN: [id number] Date of Birth: Dec 05, 1940

## 2020-04-25 ENCOUNTER — Encounter: Payer: Self-pay | Admitting: Family Medicine

## 2020-04-25 ENCOUNTER — Ambulatory Visit (INDEPENDENT_AMBULATORY_CARE_PROVIDER_SITE_OTHER): Payer: Medicare HMO | Admitting: Family Medicine

## 2020-04-25 ENCOUNTER — Other Ambulatory Visit: Payer: Self-pay

## 2020-04-25 VITALS — BP 142/82 | HR 51 | Ht 69.0 in | Wt 173.0 lb

## 2020-04-25 DIAGNOSIS — M791 Myalgia, unspecified site: Secondary | ICD-10-CM

## 2020-04-25 DIAGNOSIS — Z789 Other specified health status: Secondary | ICD-10-CM | POA: Diagnosis not present

## 2020-04-25 DIAGNOSIS — Z7409 Other reduced mobility: Secondary | ICD-10-CM

## 2020-04-25 NOTE — Patient Instructions (Addendum)
Thank you for coming in today.  Continue PT.   Think about glasses with no bifocal component with running.   Recheck with me in 2 months.   Continue the pitivastatin.

## 2020-04-25 NOTE — Progress Notes (Signed)
   Rito Ehrlich, am serving as a Education administrator for Dr. Lynne Leader.  [facility]ua Hess is a 79 y.o. male who presents to [place] [facility] today for f/u of generalized myalgias that were thought to be related to statin use.  He was last seen by Dr. Georgina Snell on 03/27/20 and was referred to PT of which he has completed 4 visits.  Since his last visit, pt reports he is feeling "mediocre" feels not strong and his muscles are sore, patient pointed to abdomen.  At the last visit his rosuvastatin was switched to Pitavastatin.  He notes this has improved things.  Overall he says he is about moderately improved.  He is working with physical therapy primarily for balance.  He has increased his activity and is doing more with less pain.  He is not all better however.  He wears Trifocals with running.   Pertinent review of systems: No fevers or chills  Relevant historical information: Carotid artery disease.   Exam:  BP (!) 142/82 (BP Location: Left Arm, Patient Position: Sitting, Cuff Size: Normal)   Pulse (!) 51   Ht 5\' 9"  (1.753 m)   Wt 173 lb (78.5 kg)   SpO2 99%   BMI 25.55 kg/m  General: Well Developed, well nourished, and in no acute distress.   MSK: Normal coordination and gait.  No significant tenderness palpation major muscle groups upper and lower extremities.      Assessment and Plan: 79 y.o. male with.  Impaired balance and myalgia.  Myalgia thought to be multifactorial possibly related to statin.  His improved symptoms with switching from rosuvastatin to Pitavastatin suggest that he did truly have statin myalgia.  Plan to continue current regimen and recheck back in 2 months. Next time he sees his PCP may be reasonable to recheck lipid panel on Pitavastatin.  Balance and falls.  Currently receiving physical therapy to reduce fall risk.  This is very important.  Also discussed behavioral modification.  He is already shifted his running routine areas raise less  likely to trip.  Additionally suggested that he does not wear bifocals when he runs.  He may benefit from dedicated eyeglasses without bifocal component as this will make it easy for him to see the ground when he is running.      Discussed warning signs or symptoms. Please see discharge instructions. Patient expresses understanding.   The above documentation has been reviewed and is accurate and complete Lynne Leader, M.D.

## 2020-04-26 ENCOUNTER — Ambulatory Visit: Payer: Medicare HMO | Admitting: Family Medicine

## 2020-04-29 ENCOUNTER — Emergency Department (HOSPITAL_BASED_OUTPATIENT_CLINIC_OR_DEPARTMENT_OTHER): Payer: Medicare HMO

## 2020-04-29 ENCOUNTER — Ambulatory Visit: Payer: Medicare HMO | Admitting: Rehabilitative and Restorative Service Providers"

## 2020-04-29 ENCOUNTER — Encounter: Payer: Self-pay | Admitting: Rehabilitative and Restorative Service Providers"

## 2020-04-29 ENCOUNTER — Encounter (HOSPITAL_BASED_OUTPATIENT_CLINIC_OR_DEPARTMENT_OTHER): Payer: Self-pay | Admitting: *Deleted

## 2020-04-29 ENCOUNTER — Other Ambulatory Visit: Payer: Self-pay

## 2020-04-29 ENCOUNTER — Emergency Department (HOSPITAL_BASED_OUTPATIENT_CLINIC_OR_DEPARTMENT_OTHER)
Admission: EM | Admit: 2020-04-29 | Discharge: 2020-04-29 | Disposition: A | Discharge status: Home / Self Care | Payer: Medicare HMO | Attending: Emergency Medicine | Admitting: Emergency Medicine

## 2020-04-29 DIAGNOSIS — Z79899 Other long term (current) drug therapy: Secondary | ICD-10-CM | POA: Insufficient documentation

## 2020-04-29 DIAGNOSIS — R2681 Unsteadiness on feet: Secondary | ICD-10-CM | POA: Diagnosis not present

## 2020-04-29 DIAGNOSIS — R29898 Other symptoms and signs involving the musculoskeletal system: Secondary | ICD-10-CM | POA: Diagnosis not present

## 2020-04-29 DIAGNOSIS — Z87891 Personal history of nicotine dependence: Secondary | ICD-10-CM | POA: Insufficient documentation

## 2020-04-29 DIAGNOSIS — Z7982 Long term (current) use of aspirin: Secondary | ICD-10-CM | POA: Diagnosis not present

## 2020-04-29 DIAGNOSIS — M6281 Muscle weakness (generalized): Secondary | ICD-10-CM

## 2020-04-29 DIAGNOSIS — R0602 Shortness of breath: Secondary | ICD-10-CM | POA: Diagnosis not present

## 2020-04-29 DIAGNOSIS — R42 Dizziness and giddiness: Secondary | ICD-10-CM | POA: Insufficient documentation

## 2020-04-29 DIAGNOSIS — R262 Difficulty in walking, not elsewhere classified: Secondary | ICD-10-CM | POA: Diagnosis not present

## 2020-04-29 DIAGNOSIS — Z85828 Personal history of other malignant neoplasm of skin: Secondary | ICD-10-CM | POA: Insufficient documentation

## 2020-04-29 DIAGNOSIS — R001 Bradycardia, unspecified: Secondary | ICD-10-CM | POA: Diagnosis not present

## 2020-04-29 LAB — CBC WITH DIFFERENTIAL/PLATELET
Abs Immature Granulocytes: 0.03 10*3/uL (ref 0.00–0.07)
Basophils Absolute: 0 10*3/uL (ref 0.0–0.1)
Basophils Relative: 0 %
Eosinophils Absolute: 0.1 10*3/uL (ref 0.0–0.5)
Eosinophils Relative: 1 %
HCT: 42.3 % (ref 39.0–52.0)
Hemoglobin: 14.9 g/dL (ref 13.0–17.0)
Immature Granulocytes: 0 %
Lymphocytes Relative: 37 %
Lymphs Abs: 3.3 10*3/uL (ref 0.7–4.0)
MCH: 32.5 pg (ref 26.0–34.0)
MCHC: 35.2 g/dL (ref 30.0–36.0)
MCV: 92.4 fL (ref 80.0–100.0)
Monocytes Absolute: 0.8 10*3/uL (ref 0.1–1.0)
Monocytes Relative: 9 %
Neutro Abs: 4.7 10*3/uL (ref 1.7–7.7)
Neutrophils Relative %: 53 %
Platelets: 198 10*3/uL (ref 150–400)
RBC: 4.58 MIL/uL (ref 4.22–5.81)
RDW: 12 % (ref 11.5–15.5)
WBC: 8.9 10*3/uL (ref 4.0–10.5)
nRBC: 0 % (ref 0.0–0.2)

## 2020-04-29 LAB — BASIC METABOLIC PANEL
Anion gap: 16 — ABNORMAL HIGH (ref 5–15)
BUN: 24 mg/dL — ABNORMAL HIGH (ref 8–23)
CO2: 21 mmol/L — ABNORMAL LOW (ref 22–32)
Calcium: 9.4 mg/dL (ref 8.9–10.3)
Chloride: 102 mmol/L (ref 98–111)
Creatinine, Ser: 1.01 mg/dL (ref 0.61–1.24)
GFR, Estimated: >60 mL/min (ref >60)
Glucose, Bld: 101 mg/dL — ABNORMAL HIGH (ref 70–99)
Potassium: 3.4 mmol/L — ABNORMAL LOW (ref 3.5–5.1)
Sodium: 139 mmol/L (ref 135–145)

## 2020-04-29 LAB — CBG MONITORING, ED: Glucose-Capillary: 86 mg/dL (ref 70–99)

## 2020-04-29 LAB — TROPONIN I (HIGH SENSITIVITY): Troponin I (High Sensitivity): 12 ng/L (ref <18)

## 2020-04-29 MED ORDER — IOHEXOL 350 MG/ML SOLN
100.0000 mL | Freq: Once | INTRAVENOUS | Status: AC | PRN
  Administered 2020-04-29: 100 mL via INTRAVENOUS

## 2020-04-29 MED ORDER — ONDANSETRON HCL 4 MG/2ML IJ SOLN
4.0000 mg | Freq: Once | INTRAMUSCULAR | Status: AC
  Administered 2020-04-29: 4 mg via INTRAVENOUS
  Filled 2020-04-29: qty 2

## 2020-04-29 MED ORDER — SODIUM CHLORIDE 0.9 % IV BOLUS
1000.0000 mL | Freq: Once | INTRAVENOUS | Status: AC
  Administered 2020-04-29: 1000 mL via INTRAVENOUS

## 2020-04-29 MED ORDER — LORAZEPAM 2 MG/ML IJ SOLN
1.0000 mg | Freq: Once | INTRAMUSCULAR | Status: AC
  Administered 2020-04-29: 1 mg via INTRAVENOUS
  Filled 2020-04-29: qty 1

## 2020-04-29 NOTE — ED Notes (Signed)
Ativan given due to patient trying to get out of bed, now patient is in bed relaxed with head elevated and family at bedside

## 2020-04-29 NOTE — Discharge Instructions (Addendum)
Please follow-up both with your primary doctor and your neurologist.  If you develop worsening dizziness, vomiting, numbness, weakness, vision changes or other new concerning symptom, please return to ER for reassessment.

## 2020-04-29 NOTE — Therapy (Signed)
[facility] [address] [place], [place], [address] Phone: [phone]   Fax:  [phone]  Physical Therapy Treatment  Patient Details  Name: Joshua Hess MRN: [id number] Date of Birth: 1941-02-15 Referring Provider (PT): Gregor Hams, MD   Encounter Date: 04/29/2020   PT End of Session - 04/29/20 1646    Visit Number 5    Number of Visits 12    Date for PT Re-Evaluation 05/23/20    Authorization Type [employer]    Progress Note Due on Visit 10    PT Start Time 1349    PT Stop Time 1430    PT Time Calculation (min) 41 min    Equipment Utilized During Treatment Gait belt    Activity Tolerance Patient tolerated treatment well    Behavior During Therapy [facility] for tasks assessed/performed           Past Medical History:  Diagnosis Date  . Aphasia   . BPH (benign prostatic hyperplasia)   . Cancer ([place])    behind ear- [place]  . Carotid artery stenosis   . Chronotropic incompetence   . ED (erectile dysfunction)   . Elevated PSA   . HOH (hard of hearing)   . Mild CAD   . Plantar fasciitis   . Postoperative atrial fibrillation ([place])   . Severe aortic stenosis    a. s/p tissue AVR 10/2018.  Marland Kitchen Sinus bradycardia    baseline  . Wears glasses     Past Surgical History:  Procedure Laterality Date  . AORTIC VALVE REPLACEMENT N/A 11/10/2018   Procedure: AORTIC VALVE REPLACEMENT (AVR), USING 23MM INSPIRIS;  Surgeon: Gaye Pollack, MD;  Location: [place];  Service: Open Heart Surgery;  Laterality: N/A;  . ENDARTERECTOMY Left 03/08/2020   Procedure: ENDARTERECTOMY CAROTID;  Surgeon: Rosetta Posner, MD;  Location: [place];  Service: Vascular;  Laterality: Left;  . EXTERNAL EAR SURGERY     melanoma removed  . HERNIA REPAIR Right    inguinal  . INGUINAL HERNIA REPAIR Right 11/15/2019   Procedure: LAPAROSCOPIC RIGHT INGUINAL HERNIA REPAIR WITH MESH;  Surgeon: Coralie Keens, MD;  Location: [place];  Service: General;  Laterality: Right;  .  RIGHT/LEFT HEART CATH AND CORONARY ANGIOGRAPHY N/A 09/29/2018   Procedure: RIGHT/LEFT HEART CATH AND CORONARY ANGIOGRAPHY;  Surgeon: Burnell Blanks, MD;  Location: [facility];  Service: Cardiovascular;  Laterality: N/A;  . TEE WITHOUT CARDIOVERSION N/A 11/10/2018   Procedure: TRANSESOPHAGEAL ECHOCARDIOGRAM (TEE);  Surgeon: Gaye Pollack, MD;  Location: [place];  Service: Open Heart Surgery;  Laterality: N/A;  . TRANSCATHETER AORTIC VALVE REPLACEMENT, TRANSFEMORAL     PROCEDURE ATTEMPTED & ABORTED   . UMBILICAL HERNIA REPAIR N/A 11/15/2019   Procedure: UMBILICAL HERNIA REPAIR;  Surgeon: Coralie Keens, MD;  Location: [place];  Service: General;  Laterality: N/A;    There were no vitals filed for this visit.   Subjective Assessment - 04/29/20 1644    Subjective [name] reports good compliance with his balance activities at home.  This is a great concern to him.    Patient is accompained by: Family member   Mark   Patient Stated Goals ability to run a marathon                             [facility] Adult PT Treatment/Exercise - 04/29/20 0001      Neuro Re-ed    Neuro Re-ed Details  Tandem eyes open; eyes open head side to side (slightly wider stance); eyes closed (even wider stance); Running gait; Heel to toe raises no hands; Heel to toe walk; retro heel to toe walk; [place] 40 minutes                  PT Education - 04/29/20 1645    Education Details Reviewed, progressed and updated clinic and home programs with balance emphasis.    Person(s) Educated Spouse;Patient    Methods Explanation;Demonstration;Tactile cues;Verbal cues;Handout    Comprehension Need further instruction;Returned demonstration;Verbal cues required;Tactile cues required;Verbalized understanding               PT Long Term Goals - 04/29/20 1646      PT LONG TERM GOAL #1   Title independent with HEP    Status On-going      PT LONG TERM GOAL #2   Title improve FGA to >/=  25/30 for improved balance and mobility    Status On-going      PT LONG TERM GOAL #3   Title demonstrate improved balance by standing with EC and feet together on compliant surface > 20 sec without LOB    Status On-going                 Plan - 04/29/20 1647    Clinical Impression Statement Balance continues to be a heavy emphasis as Mahdi is most concerned with his balance with his return to running.  Dynamic balance was progressed today and reaction time was a huge emphasis.  Continue balance and gait work to allow Bl[place]ton to return to his previous level of function including marathon training.    Personal Factors and Comorbidities Comorbidity 3+    Comorbidities aortic valve replacement, hernia repair with complication of sepsis, CVA July 2021 s/p carotid endarterectomy    Examination-Activity Limitations Locomotion Level;Stairs    Examination-Participation Restrictions Community Activity;Yard Work;Other   exercise   Stability/Clinical Decision Making Evolving/Moderate complexity    Rehab Potential Good    PT Frequency 2x / week   1-2x/wk   PT Duration 6 weeks    PT Treatment/Interventions ADLs/Self Care Home Management;Cryotherapy;Electrical Stimulation;Moist Heat;Balance training;Therapeutic exercise;Therapeutic activities;Functional mobility training;Stair training;Gait training;Neuromuscular re-education;Patient/family education;Energy conservation    PT Next Visit Plan progress strenth, endurance, balance, coodination as able.    PT Home Exercise Plan Access Code: [email or web address].  Access Code: [email or web address].  Access Code: [email or web address]    Consulted and Agree with Plan of Care Patient;Family member/caregiver    Family Member Consulted Partner           Patient will benefit from skilled therapeutic intervention in order to improve the following deficits and impairments:  Abnormal gait, Decreased balance, Decreased mobility, Decreased endurance, Decreased activity tolerance  Visit  Diagnosis: Unsteadiness on feet  Difficulty in walking, not elsewhere classified  Muscle weakness (generalized)  Other symptoms and signs involving the musculoskeletal system     Problem List Patient Active Problem List   Diagnosis Date Noted  . Facial droop 03/05/2020  . Mild CAD   . Acute encephalopathy 11/18/2019  . Acute urinary retention 11/18/2019  . Post-operative state 11/18/2019  . Aphasia 08/07/2019  . S/P AVR (aortic valve replacement)   . Carotid artery stenosis   . Chronotropic incompetence   . Severe aortic stenosis   . Sinus bradycardia 08/12/2018  . Benign prostatic hyperplasia with nocturia 11/01/2012    Farley Ly PT, MPT 04/29/2020, 4:49 PM  [facility]  [address] [place], [place], [address] Phone: [phone]   Fax:  [phone]  Name: LEGACY LACIVITA MRN: [id number] Date of Birth: [birth year]

## 2020-04-29 NOTE — Patient Instructions (Signed)
Access Code: [email or web address] URL: [email or web address] Date: 04/29/2020 Prepared by: Vista Mink  Exercises Heel Toe Raises with Counter Support - 2-3 x daily - 7 x weekly - 1 sets - 10 reps - 3 seconds hold

## 2020-04-29 NOTE — ED Triage Notes (Signed)
Hx of mild stroke 5 weeks ago with right sided weakness. 3 hours ago he walked down stairs and c.o dizziness. Trembling on arrival to triage. Son states he has an appointment to have to trembling evaluated. He is alert.

## 2020-04-29 NOTE — ED Provider Notes (Signed)
[facility] EMERGENCY DEPARTMENT Provider Note   CSN: [id number] Arrival date & time: 04/29/20  2005     History Chief Complaint  Patient presents with  . Dizziness    Joshua Hess is a 79 y.o. male.  Presents to ER with concern for dizziness.  History obtained from patient as well as his husband.  Patient was in his normal state of health throughout the day today, this evening around 6:00 or so he had sudden onset of dizziness, had some difficulty walking.  Dizziness was relatively constant, worse with sudden movements, alleviated with rest.  Patient also reports that he feels very anxious.  No chest pain or difficulty in breathing.  No speech changes, vision changes, numbness or weakness.  Patient has history of CVA, carotid stenosis s/p endarterectomy.   HPI     Past Medical History:  Diagnosis Date  . Aphasia   . BPH (benign prostatic hyperplasia)   . Cancer ([place])    behind ear- [place]  . Carotid artery stenosis   . Chronotropic incompetence   . ED (erectile dysfunction)   . Elevated PSA   . HOH (hard of hearing)   . Mild CAD   . Plantar fasciitis   . Postoperative atrial fibrillation ([place])   . Severe aortic stenosis    a. s/p tissue AVR 10/2018.  Marland Kitchen Sinus bradycardia    baseline  . Wears glasses     Patient Active Problem List   Diagnosis Date Noted  . Facial droop 03/05/2020  . Mild CAD   . Acute encephalopathy 11/18/2019  . Acute urinary retention 11/18/2019  . Post-operative state 11/18/2019  . Aphasia 08/07/2019  . S/P AVR (aortic valve replacement)   . Carotid artery stenosis   . Chronotropic incompetence   . Severe aortic stenosis   . Sinus bradycardia 08/12/2018  . Benign prostatic hyperplasia with nocturia 11/01/2012    Past Surgical History:  Procedure Laterality Date  . AORTIC VALVE REPLACEMENT N/A 11/10/2018   Procedure: AORTIC VALVE REPLACEMENT (AVR), USING 23MM INSPIRIS;  Surgeon: Gaye Pollack, MD;  Location: [facility];   Service: Open Heart Surgery;  Laterality: N/A;  . ENDARTERECTOMY Left 03/08/2020   Procedure: ENDARTERECTOMY CAROTID;  Surgeon: Rosetta Posner, MD;  Location: [facility];  Service: Vascular;  Laterality: Left;  . EXTERNAL EAR SURGERY     melanoma removed  . HERNIA REPAIR Right    inguinal  . INGUINAL HERNIA REPAIR Right 11/15/2019   Procedure: LAPAROSCOPIC RIGHT INGUINAL HERNIA REPAIR WITH MESH;  Surgeon: Coralie Keens, MD;  Location: [facility];  Service: General;  Laterality: Right;  . RIGHT/LEFT HEART CATH AND CORONARY ANGIOGRAPHY N/A 09/29/2018   Procedure: RIGHT/LEFT HEART CATH AND CORONARY ANGIOGRAPHY;  Surgeon: Burnell Blanks, MD;  Location: [facility];  Service: Cardiovascular;  Laterality: N/A;  . TEE WITHOUT CARDIOVERSION N/A 11/10/2018   Procedure: TRANSESOPHAGEAL ECHOCARDIOGRAM (TEE);  Surgeon: Gaye Pollack, MD;  Location: [facility];  Service: Open Heart Surgery;  Laterality: N/A;  . TRANSCATHETER AORTIC VALVE REPLACEMENT, TRANSFEMORAL     PROCEDURE ATTEMPTED & ABORTED   . UMBILICAL HERNIA REPAIR N/A 11/15/2019   Procedure: UMBILICAL HERNIA REPAIR;  Surgeon: Coralie Keens, MD;  Location: [facility];  Service: General;  Laterality: N/A;       Family History  Problem Relation Age of Onset  . Heart attack Mother   . Other Father        unsure of history  . Heart disease Brother   .  Dementia Sister     Social History   Tobacco Use  . Smoking status: Former Smoker    Years: 1.00    Quit date: 1970    Years since quitting: 51.8  . Smokeless tobacco: Never Used  Vaping Use  . Vaping Use: Never used  Substance Use Topics  . Alcohol use: No  . Drug use: No    Home Medications Prior to Admission medications   Medication Sig Start Date End Date Taking? Authorizing Provider  ALPRAZolam Duanne Moron) 0.25 MG tablet Take 0.25 mg by mouth at bedtime as needed for anxiety.    [provider]  amoxicillin (AMOXIL) 500 MG capsule TAKE 4 CAPSULES BY MOUTH ONE TO TWO  HOURS PRIOR TO DENTAL APPOINTMENT Patient not taking: Reported on 04/25/2020 03/01/20   Burnell Blanks, MD  Apoaequorin (PREVAGEN) 10 MG CAPS Take 10 mg by mouth daily.     [provider]  aspirin EC 81 MG EC tablet Take 1 tablet (81 mg total) by mouth daily. Swallow whole. 03/10/20   Raiford Noble Latif, DO  finasteride (PROSCAR) 5 MG tablet Take 1 tablet (5 mg total) by mouth daily. 11/22/19   Kayleen Memos, DO  Pitavastatin Magnesium (ZYPITAMAG) 4 MG TABS Take 4 mg by mouth daily. 04/01/20   Gregor Hams, MD  polyethylene glycol (MIRALAX / GLYCOLAX) 17 g packet Take 17 g by mouth daily. Patient taking differently: Take 17 g by mouth daily as needed for mild constipation.  11/22/19   Kayleen Memos, DO  sertraline (ZOLOFT) 100 MG tablet Take 50 mg by mouth daily.     [provider]  sildenafil (REVATIO) 20 MG tablet Take 20 mg by mouth daily as needed (ED).    [provider]  traZODone (DESYREL) 100 MG tablet Take 50-100 mg by mouth at bedtime.     [provider]    Allergies    Flomax [tamsulosin]  Review of Systems   Review of Systems  Constitutional: Negative for chills and fever.  HENT: Negative for ear pain and sore throat.   Eyes: Negative for pain and visual disturbance.  Respiratory: Negative for cough and shortness of breath.   Cardiovascular: Negative for chest pain and palpitations.  Gastrointestinal: Negative for abdominal pain and vomiting.  Genitourinary: Negative for dysuria and hematuria.  Musculoskeletal: Negative for arthralgias and back pain.  Skin: Negative for color change and rash.  Neurological: Positive for dizziness. Negative for seizures and syncope.  All other systems reviewed and are negative.   Physical Exam Updated Vital Signs BP (!) 154/84   Pulse (!) 59   Resp (!) 24   Ht 5\' 9"  (1.753 m)   Wt 78.5 kg   SpO2 96%   BMI 25.56 kg/m   Physical Exam Vitals and nursing note reviewed.  Constitutional:       Appearance: He is well-developed.  HENT:     Head: Normocephalic and atraumatic.  Eyes:     Conjunctiva/sclera: Conjunctivae normal.  Cardiovascular:     Rate and Rhythm: Normal rate and regular rhythm.     Heart sounds: No murmur heard.   Pulmonary:     Effort: Pulmonary effort is normal. No respiratory distress.     Breath sounds: Normal breath sounds.  Abdominal:     Palpations: Abdomen is soft.     Tenderness: There is no abdominal tenderness.  Musculoskeletal:        General: No deformity or signs of injury.  Cervical back: Neck supple.  Skin:    General: Skin is warm and dry.     Capillary Refill: Capillary refill takes less than 2 seconds.  Neurological:     Mental Status: He is alert.     Comments: AAOx3 CN 2-12 intact, speech clear visual fields intact 5/5 strength in b/l UE and LE Sensation to light touch intact in b/l UE and LE Normal FNF Normal gait     ED Results / Procedures / Treatments   Labs (all labs ordered are listed, but only abnormal results are displayed) Labs Reviewed  BASIC METABOLIC PANEL - Abnormal; Notable for the following components:      Result Value   Potassium 3.4 (*)    CO2 21 (*)    Glucose, Bld 101 (*)    BUN 24 (*)    Anion gap 16 (*)    All other components within normal limits  CBC WITH DIFFERENTIAL/PLATELET  CBG MONITORING, ED  TROPONIN I (HIGH SENSITIVITY)  TROPONIN I (HIGH SENSITIVITY)    EKG EKG Interpretation  Date/Time:  Monday April 29 2020 20:44:02 EDT Ventricular Rate:  83 PR Interval:    QRS Duration: 109 QT Interval:  392 QTC Calculation: 461 R Axis:   41 Text Interpretation: Sinus rhythm Multiform ventricular premature complexes Nonspecific T abnormalities, lateral leads Confirmed by Madalyn Rob [phone]) on 04/29/2020 10:05:41 PM   Radiology CT Angio Head W or Wo Contrast  Result Date: 04/29/2020 CLINICAL DATA:  79 year old male with high-grade left carotid bifurcation stenosis on  August CTA. Recent left carotid surgery. Now with severe dizziness. EXAM: CT ANGIOGRAPHY HEAD AND NECK TECHNIQUE: Multidetector CT imaging of the head and neck was performed using the standard protocol during bolus administration of intravenous contrast. Multiplanar CT image reconstructions and MIPs were obtained to evaluate the vascular anatomy. Carotid stenosis measurements (when applicable) are obtained utilizing NASCET criteria, using the distal internal carotid diameter as the denominator. CONTRAST:  135mL OMNIPAQUE IOHEXOL 350 MG/ML SOLN COMPARISON:  Preoperative CTA head and neck 03/05/2020. Head CT and brain MRI 03/05/2020. FINDINGS: CT HEAD Brain: No midline shift, ventriculomegaly, mass effect, evidence of mass lesion, intracranial hemorrhage or evidence of cortically based acute infarction. Of stable patchy white matter hypodensity in both hemispheres, mild for age. Calvarium and skull base: No acute osseous abnormality identified. Paranasal sinuses: Visualized paranasal sinuses and mastoids are stable and well pneumatized. Orbits: Stable orbit and scalp soft tissues since August. CTA NECK Skeleton: Prior sternotomy. Cervical spine degeneration. No acute osseous abnormality identified. Upper chest: Negative. Other neck: Small surgical clips surrounding the left carotid space. No adverse postoperative features, vascular details below. Otherwise stable and negative neck soft tissues. Aortic arch: 3 vessel arch configuration. Stable mild arch tortuosity and calcified plaque. Right carotid system: Mildly tortuous brachiocephalic artery and proximal right CCA without plaque or stenosis. Calcified plaque in the right CCA at the level of the larynx without stenosis. Similar mild calcified plaque at the lateral right ICA origin and bulb without stenosis. Left carotid system: Normal left CCA origin. Mildly tortuous proximal left CCA. Sequelae of left carotid endarterectomy with resolved stenosis. Patent left ICA  to the skull base. Vertebral arteries: Stable proximal right subclavian artery tortuosity and calcified plaque with up to 50% stenosis. Right vertebral artery origin remains normal. Right vertebral is mildly tortuous and patent to the skull base without stenosis. Minimal plaque in the proximal left subclavian artery without stenosis. Normal left vertebral artery origin. Tortuous left V1 and V2 segments.  Patent left vertebral to the skull base without stenosis. CTA HEAD Posterior circulation: Mild distal vertebral artery plaque on the left without stenosis. Fairly codominant V4 segments. Patent vertebrobasilar junction. Patent left PICA and dominant appearing right AICA. Patent basilar artery without stenosis. Patent SCA and PCA origins. Posterior communicating arteries are diminutive or absent. Mild bilateral PCA P1 and P2 irregularity is stable. Bilateral PCA branches are otherwise within normal limits. Anterior circulation: Both ICA siphons are patent. Moderate calcified left siphon plaque but with only mild associated stenosis. Normal left ophthalmic artery origin. Similar moderate right siphon calcified plaque but with only mild cavernous segment stenosis. Normal right ophthalmic artery origin. Carotid termini are patent and stable. Patent MCA and ACA origins. Diminutive or absent anterior communicating artery. Bilateral ACA branches are stable and within normal limits. MCA M1 segments and bifurcations are patent without stenosis. Bilateral MCA branches are stable and within normal limits. Venous sinuses: Patent. Anatomic variants: None. Review of the MIP images confirms the above findings IMPRESSION: 1. Interval Left Carotid Endarterectomy with resolved stenosis and no adverse features. 2. Stable vascular findings and atherosclerosis elsewhere since August. - no significant posterior circulation plaque or stenosis. - bilateral ICA siphon calcified plaque with mild stenosis. - up to 50% stenosis right subclavian  artery origin. 3. No acute intracranial abnormality. Stable CT appearance of the brain. Electronically Signed   By: Genevie Ann M.D.   On: 04/29/2020 22:23   CT Angio Neck W and/or Wo Contrast  Result Date: 04/29/2020 CLINICAL DATA:  79 year old male with high-grade left carotid bifurcation stenosis on August CTA. Recent left carotid surgery. Now with severe dizziness. EXAM: CT ANGIOGRAPHY HEAD AND NECK TECHNIQUE: Multidetector CT imaging of the head and neck was performed using the standard protocol during bolus administration of intravenous contrast. Multiplanar CT image reconstructions and MIPs were obtained to evaluate the vascular anatomy. Carotid stenosis measurements (when applicable) are obtained utilizing NASCET criteria, using the distal internal carotid diameter as the denominator. CONTRAST:  151mL OMNIPAQUE IOHEXOL 350 MG/ML SOLN COMPARISON:  Preoperative CTA head and neck 03/05/2020. Head CT and brain MRI 03/05/2020. FINDINGS: CT HEAD Brain: No midline shift, ventriculomegaly, mass effect, evidence of mass lesion, intracranial hemorrhage or evidence of cortically based acute infarction. Of stable patchy white matter hypodensity in both hemispheres, mild for age. Calvarium and skull base: No acute osseous abnormality identified. Paranasal sinuses: Visualized paranasal sinuses and mastoids are stable and well pneumatized. Orbits: Stable orbit and scalp soft tissues since August. CTA NECK Skeleton: Prior sternotomy. Cervical spine degeneration. No acute osseous abnormality identified. Upper chest: Negative. Other neck: Small surgical clips surrounding the left carotid space. No adverse postoperative features, vascular details below. Otherwise stable and negative neck soft tissues. Aortic arch: 3 vessel arch configuration. Stable mild arch tortuosity and calcified plaque. Right carotid system: Mildly tortuous brachiocephalic artery and proximal right CCA without plaque or stenosis. Calcified plaque in the  right CCA at the level of the larynx without stenosis. Similar mild calcified plaque at the lateral right ICA origin and bulb without stenosis. Left carotid system: Normal left CCA origin. Mildly tortuous proximal left CCA. Sequelae of left carotid endarterectomy with resolved stenosis. Patent left ICA to the skull base. Vertebral arteries: Stable proximal right subclavian artery tortuosity and calcified plaque with up to 50% stenosis. Right vertebral artery origin remains normal. Right vertebral is mildly tortuous and patent to the skull base without stenosis. Minimal plaque in the proximal left subclavian artery without stenosis. Normal left vertebral artery  origin. Tortuous left V1 and V2 segments. Patent left vertebral to the skull base without stenosis. CTA HEAD Posterior circulation: Mild distal vertebral artery plaque on the left without stenosis. Fairly codominant V4 segments. Patent vertebrobasilar junction. Patent left PICA and dominant appearing right AICA. Patent basilar artery without stenosis. Patent SCA and PCA origins. Posterior communicating arteries are diminutive or absent. Mild bilateral PCA P1 and P2 irregularity is stable. Bilateral PCA branches are otherwise within normal limits. Anterior circulation: Both ICA siphons are patent. Moderate calcified left siphon plaque but with only mild associated stenosis. Normal left ophthalmic artery origin. Similar moderate right siphon calcified plaque but with only mild cavernous segment stenosis. Normal right ophthalmic artery origin. Carotid termini are patent and stable. Patent MCA and ACA origins. Diminutive or absent anterior communicating artery. Bilateral ACA branches are stable and within normal limits. MCA M1 segments and bifurcations are patent without stenosis. Bilateral MCA branches are stable and within normal limits. Venous sinuses: Patent. Anatomic variants: None. Review of the MIP images confirms the above findings IMPRESSION: 1. Interval  Left Carotid Endarterectomy with resolved stenosis and no adverse features. 2. Stable vascular findings and atherosclerosis elsewhere since August. - no significant posterior circulation plaque or stenosis. - bilateral ICA siphon calcified plaque with mild stenosis. - up to 50% stenosis right subclavian artery origin. 3. No acute intracranial abnormality. Stable CT appearance of the brain. Electronically Signed   By: Genevie Ann M.D.   On: 04/29/2020 22:23   DG Chest Portable 1 View  Result Date: 04/29/2020 CLINICAL DATA:  Shortness of breath EXAM: PORTABLE CHEST 1 VIEW COMPARISON:  08/02/2019 FINDINGS: Cardiac shadow is mildly enlarged. Postsurgical changes are seen and stable. The lungs are well aerated bilaterally. No focal infiltrate or sizable effusion is seen. No acute bony abnormality is noted. IMPRESSION: No acute abnormality noted. Electronically Signed   By: Inez Catalina M.D.   On: 04/29/2020 21:55    Procedures Procedures (including critical care time)  Medications Ordered in ED Medications  LORazepam (ATIVAN) injection 1 mg (1 mg Intravenous Given 04/29/20 2107)  ondansetron (ZOFRAN) injection 4 mg (4 mg Intravenous Given 04/29/20 2106)  sodium chloride 0.9 % bolus 1,000 mL (0 mLs Intravenous Stopped 04/29/20 2256)  iohexol (OMNIPAQUE) 350 MG/ML injection 100 mL (100 mLs Intravenous Contrast Given 04/29/20 2149)    ED Course  I have reviewed the triage vital signs and the nursing notes.  Pertinent labs & imaging results that were available during my care of the patient were reviewed by me and considered in my medical decision making (see chart for details).    MDM Rules/Calculators/A&P                          79 y.o.malewith medical history significant forsevere AS s/p tissue AVR April 2020, mild nonobstructive CAD, sinus bradycardia, carotid artery stenosis presents to ER with concern for dizziness.  Mild stroke versus TIA in August.  On Plavix.  On initial arrival here  patient complaining of dizziness, no other neurologic complaints or findings on exam.  Given his history, obtain CTA head and neck which were negative for acute pathology, no complications from carotid endarterectomy.  On reassessment, patient symptoms had resolved, he was able to ambulate in room without assistance.  Given no ongoing neurologic complaints and a normal neurologic exam, low suspicion for acute stroke today.  Recommend he follow-up with his primary doctor and neurologist.  Reviewed return precautions in detail with patient  as well as his spouse.     After the discussed management above, the patient was determined to be safe for discharge.  The patient was in agreement with this plan and all questions regarding their care were answered.  ED return precautions were discussed and the patient will return to the ED with any significant worsening of condition.       Final Clinical Impression(s) / ED Diagnoses Final diagnoses:  Dizziness    Rx / DC Orders ED Discharge Orders    None       Lucrezia Starch, MD 04/29/20 2338

## 2020-05-01 ENCOUNTER — Encounter: Payer: Self-pay | Admitting: Physical Therapy

## 2020-05-01 ENCOUNTER — Ambulatory Visit: Payer: Medicare HMO | Admitting: Physical Therapy

## 2020-05-01 ENCOUNTER — Encounter: Payer: Medicare HMO | Admitting: Physical Therapy

## 2020-05-01 ENCOUNTER — Other Ambulatory Visit: Payer: Self-pay

## 2020-05-01 VITALS — BP 155/105 | HR 65

## 2020-05-01 DIAGNOSIS — M6281 Muscle weakness (generalized): Secondary | ICD-10-CM | POA: Diagnosis not present

## 2020-05-01 DIAGNOSIS — R262 Difficulty in walking, not elsewhere classified: Secondary | ICD-10-CM | POA: Diagnosis not present

## 2020-05-01 DIAGNOSIS — R2681 Unsteadiness on feet: Secondary | ICD-10-CM | POA: Diagnosis not present

## 2020-05-01 DIAGNOSIS — R29898 Other symptoms and signs involving the musculoskeletal system: Secondary | ICD-10-CM

## 2020-05-01 NOTE — Therapy (Signed)
[facility] [address] [place], [place], [address] Phone: [phone]   Fax:  [phone]  Physical Therapy Treatment  Patient Details  Name: Joshua Hess MRN: [id number] Date of Birth: [birth year] Referring Provider (PT): Gregor Hams, MD   Encounter Date: 05/01/2020   PT End of Session - 05/01/20 1556    Visit Number 6    Number of Visits 12    Date for PT Re-Evaluation 05/23/20    Authorization Type [employer]    Progress Note Due on Visit 10    PT Start Time 1515    PT Stop Time 1555    PT Time Calculation (min) 40 min    Equipment Utilized During Treatment Gait belt    Activity Tolerance Patient tolerated treatment well    Behavior During Therapy [facility] for tasks assessed/performed           Past Medical History:  Diagnosis Date  . Aphasia   . BPH (benign prostatic hyperplasia)   . Cancer ([place])    behind ear- melomona  . Carotid artery stenosis   . Chronotropic incompetence   . ED (erectile dysfunction)   . Elevated PSA   . HOH (hard of hearing)   . Mild CAD   . Plantar fasciitis   . Postoperative atrial fibrillation ([place])   . Severe aortic stenosis    a. s/p tissue AVR 10/2018.  Marland Kitchen Sinus bradycardia    baseline  . Wears glasses     Past Surgical History:  Procedure Laterality Date  . AORTIC VALVE REPLACEMENT N/A 11/10/2018   Procedure: AORTIC VALVE REPLACEMENT (AVR), USING 23MM INSPIRIS;  Surgeon: Gaye Pollack, MD;  Location: [place];  Service: Open Heart Surgery;  Laterality: N/A;  . ENDARTERECTOMY Left 03/08/2020   Procedure: ENDARTERECTOMY CAROTID;  Surgeon: Rosetta Posner, MD;  Location: [place];  Service: Vascular;  Laterality: Left;  . EXTERNAL EAR SURGERY     melanoma removed  . HERNIA REPAIR Right    inguinal  . INGUINAL HERNIA REPAIR Right 11/15/2019   Procedure: LAPAROSCOPIC RIGHT INGUINAL HERNIA REPAIR WITH MESH;  Surgeon: Coralie Keens, MD;  Location: [place];  Service: General;  Laterality: Right;  .  RIGHT/LEFT HEART CATH AND CORONARY ANGIOGRAPHY N/A 09/29/2018   Procedure: RIGHT/LEFT HEART CATH AND CORONARY ANGIOGRAPHY;  Surgeon: Burnell Blanks, MD;  Location: [place] CV LAB;  Service: Cardiovascular;  Laterality: N/A;  . TEE WITHOUT CARDIOVERSION N/A 11/10/2018   Procedure: TRANSESOPHAGEAL ECHOCARDIOGRAM (TEE);  Surgeon: Gaye Pollack, MD;  Location: [place];  Service: Open Heart Surgery;  Laterality: N/A;  . TRANSCATHETER AORTIC VALVE REPLACEMENT, TRANSFEMORAL     PROCEDURE ATTEMPTED & ABORTED   . UMBILICAL HERNIA REPAIR N/A 11/15/2019   Procedure: UMBILICAL HERNIA REPAIR;  Surgeon: Coralie Keens, MD;  Location: [place];  Service: General;  Laterality: N/A;    Vitals:   05/01/20 9528 05/01/20 1530  BP: (!) 153/103 (!) 155/105  Pulse: 65      Subjective Assessment - 05/01/20 1517    Subjective went to ED Monday evening - sudden onset of vertigo.  CT negative.    Patient is accompained by: Family member   Mark   Patient Stated Goals ability to run a marathon    Currently in Pain? No/denies                   Vestibular Assessment - 05/01/20 1520      Vestibular Assessment   General Observation symptoms at  rest 4/10      Symptom Behavior   Type of Dizziness  Imbalance;"Funny feeling in head";"Horizon moves"    Frequency of Dizziness constant    Duration of Dizziness constant    Aggravating Factors Activity in general    Relieving Factors Closing eyes;Dark room;Medication    Progression of Symptoms Better      Oculomotor Exam   Spontaneous Absent    Gaze-induced  Absent    Smooth Pursuits Intact    Saccades Intact      Vestibulo-Ocular Reflex   VOR 1 Head Only (x 1 viewing) WNL with increase in symptoms (horizontal only)      Positional Testing   Sidelying Test Sidelying Right;Sidelying Left    Horizontal Canal Testing Horizontal Canal Right;Horizontal Canal Left      Sidelying Right   Sidelying Right Duration none    Sidelying Right Symptoms  No nystagmus      Sidelying Left   Sidelying Left Duration none    Sidelying Left Symptoms No nystagmus      Horizontal Canal Right   Horizontal Canal Right Duration none    Horizontal Canal Right Symptoms Normal      Horizontal Canal Left   Horizontal Canal Left Duration none    Horizontal Canal Left Symptoms Normal      Cognition   Cognition Comment difficulty following 2 step commands      Orthostatics   BP sitting 155/105    BP standing (after 1 minute) 159/99                     Vestibular Treatment/Exercise - 05/01/20 1555      Vestibular Treatment/Exercise   Vestibular Treatment Provided Gaze    Gaze Exercises X1 Viewing Horizontal;X1 Viewing Vertical      X1 Viewing Horizontal   Foot Position sitting    Time --   30 sec   Reps 1      X1 Viewing Vertical   Foot Position sitting    Time --   30 sec   Reps 1                 PT Education - 05/01/20 1556    Education Details gaze    Person(s) Educated Patient;Spouse    Methods Explanation;Demonstration;Handout    Comprehension Verbalized understanding;Returned demonstration;Need further instruction               PT Long Term Goals - 04/29/20 1646      PT LONG TERM GOAL #1   Title independent with HEP    Status On-going      PT LONG TERM GOAL #2   Title improve FGA to >/= 25/30 for improved balance and mobility    Status On-going      PT LONG TERM GOAL #3   Title demonstrate improved balance by standing with EC and feet together on compliant surface > 20 sec without LOB    Status On-going                 Plan - 05/01/20 1556    Clinical Impression Statement Pt with sudden onset of dizziness Monday evening, so vestibular assessment performed today.  Pt with elevated BP today and recommended pt and spouse follow up with PCP if BP is elevated later today.  Positional testing negative today, with increase in symptoms with VOR suggestive of vestibular hypofunction.   Encouraged gaze, and follow up with neurologist and PCP given new onset of  symptoms.  Will continue to benefit from PT to maximize function.    Personal Factors and Comorbidities Comorbidity 3+    Comorbidities aortic valve replacement, hernia repair with complication of sepsis, CVA July 2021 s/p carotid endarterectomy    Examination-Activity Limitations Locomotion Level;Stairs    Examination-Participation Restrictions Community Activity;Yard Work;Other   exercise   Stability/Clinical Decision Making Evolving/Moderate complexity    Rehab Potential Good    PT Frequency 2x / week   1-2x/wk   PT Duration 6 weeks    PT Treatment/Interventions ADLs/Self Care Home Management;Cryotherapy;Electrical Stimulation;Moist Heat;Balance training;Therapeutic exercise;Therapeutic activities;Functional mobility training;Stair training;Gait training;Neuromuscular re-education;Patient/family education;Energy conservation    PT Next Visit Plan progress strenth, endurance, balance, coodination as able., check BP and see how vertigo is    PT Home Exercise Plan Access Code: [email or web address].  Access Code: [email or web address].  Access Code: [email or web address]    Consulted and Agree with Plan of Care Patient;Family member/caregiver    Family Member Consulted Partner           Patient will benefit from skilled therapeutic intervention in order to improve the following deficits and impairments:  Abnormal gait, Decreased balance, Decreased mobility, Decreased endurance, Decreased activity tolerance  Visit Diagnosis: Unsteadiness on feet  Difficulty in walking, not elsewhere classified  Muscle weakness (generalized)  Other symptoms and signs involving the musculoskeletal system     Problem List Patient Active Problem List   Diagnosis Date Noted  . Facial droop 03/05/2020  . Mild CAD   . Acute encephalopathy 11/18/2019  . Acute urinary retention 11/18/2019  . Post-operative state 11/18/2019  . Aphasia 08/07/2019  . S/P AVR (aortic  valve replacement)   . Carotid artery stenosis   . Chronotropic incompetence   . Severe aortic stenosis   . Sinus bradycardia 08/12/2018  . Benign prostatic hyperplasia with nocturia 11/01/2012       Laureen Abrahams, PT, DPT 05/01/20 3:59 PM    [facility] [address] [place], [place], [address] Phone: [phone]   Fax:  [phone]  Name: Joshua Hess MRN: [id number] Date of Birth: 1940-12-18

## 2020-05-01 NOTE — Patient Instructions (Signed)
Access Code: [email or web address] URL: [email or web address] Date: 05/01/2020 Prepared by: Faustino Congress  Exercises  Seated Gaze Stabilization with Head Rotation - 2 x daily - 7 x weekly - 1 sets - 30 seconds Seated Gaze Stabilization with Head Nod - 2 x daily - 7 x weekly - 1 sets - 30 seconds

## 2020-05-06 ENCOUNTER — Ambulatory Visit: Payer: Medicare HMO | Admitting: Physical Therapy

## 2020-05-06 ENCOUNTER — Other Ambulatory Visit: Payer: Self-pay

## 2020-05-06 ENCOUNTER — Encounter: Payer: Self-pay | Admitting: Physical Therapy

## 2020-05-06 VITALS — BP 142/82

## 2020-05-06 DIAGNOSIS — M6281 Muscle weakness (generalized): Secondary | ICD-10-CM | POA: Diagnosis not present

## 2020-05-06 DIAGNOSIS — R29898 Other symptoms and signs involving the musculoskeletal system: Secondary | ICD-10-CM

## 2020-05-06 DIAGNOSIS — R262 Difficulty in walking, not elsewhere classified: Secondary | ICD-10-CM

## 2020-05-06 DIAGNOSIS — R2681 Unsteadiness on feet: Secondary | ICD-10-CM | POA: Diagnosis not present

## 2020-05-06 NOTE — Therapy (Signed)
[facility] Physical Therapy [address] [place], [place], [address] Phone: [phone]   Fax:  [phone]  Physical Therapy Treatment  Patient Details  Name: Joshua Hess MRN: [id number] Date of Birth: 1941/04/20 Referring Provider (PT): Gregor Hams, MD   Encounter Date: 05/06/2020   PT End of Session - 05/06/20 1558    Visit Number 7    Number of Visits 12    Date for PT Re-Evaluation 05/23/20    Authorization Type [employer] 12 visits through 11/30    Progress Note Due on Visit 10    PT Start Time 1315    PT Stop Time 1355    PT Time Calculation (min) 40 min    Equipment Utilized During Treatment Gait belt    Activity Tolerance Patient tolerated treatment well    Behavior During Therapy [facility] for tasks assessed/performed           Past Medical History:  Diagnosis Date  . Aphasia   . BPH (benign prostatic hyperplasia)   . Cancer ([place])    behind ear- melomona  . Carotid artery stenosis   . Chronotropic incompetence   . ED (erectile dysfunction)   . Elevated PSA   . HOH (hard of hearing)   . Mild CAD   . Plantar fasciitis   . Postoperative atrial fibrillation ([place])   . Severe aortic stenosis    a. s/p tissue AVR 10/2018.  Marland Kitchen Sinus bradycardia    baseline  . Wears glasses     Past Surgical History:  Procedure Laterality Date  . AORTIC VALVE REPLACEMENT N/A 11/10/2018   Procedure: AORTIC VALVE REPLACEMENT (AVR), USING 23MM INSPIRIS;  Surgeon: Gaye Pollack, MD;  Location: [place];  Service: Open Heart Surgery;  Laterality: N/A;  . ENDARTERECTOMY Left 03/08/2020   Procedure: ENDARTERECTOMY CAROTID;  Surgeon: Rosetta Posner, MD;  Location: [place];  Service: Vascular;  Laterality: Left;  . EXTERNAL EAR SURGERY     melanoma removed  . HERNIA REPAIR Right    inguinal  . INGUINAL HERNIA REPAIR Right 11/15/2019   Procedure: LAPAROSCOPIC RIGHT INGUINAL HERNIA REPAIR WITH MESH;  Surgeon: Coralie Keens, MD;  Location: [place];  Service: General;   Laterality: Right;  . RIGHT/LEFT HEART CATH AND CORONARY ANGIOGRAPHY N/A 09/29/2018   Procedure: RIGHT/LEFT HEART CATH AND CORONARY ANGIOGRAPHY;  Surgeon: Burnell Blanks, MD;  Location: [place] CV LAB;  Service: Cardiovascular;  Laterality: N/A;  . TEE WITHOUT CARDIOVERSION N/A 11/10/2018   Procedure: TRANSESOPHAGEAL ECHOCARDIOGRAM (TEE);  Surgeon: Gaye Pollack, MD;  Location: [place];  Service: Open Heart Surgery;  Laterality: N/A;  . TRANSCATHETER AORTIC VALVE REPLACEMENT, TRANSFEMORAL     PROCEDURE ATTEMPTED & ABORTED   . UMBILICAL HERNIA REPAIR N/A 11/15/2019   Procedure: UMBILICAL HERNIA REPAIR;  Surgeon: Coralie Keens, MD;  Location: [place];  Service: General;  Laterality: N/A;    Vitals:   05/06/20 1518 05/06/20 1520  BP: (!) 156/98 (!) 142/82     Subjective Assessment - 05/06/20 1519    Subjective doing 20-25 sec with horizontal gaze    Patient is accompained by: Family member   Mark   Patient Stated Goals ability to run a marathon    Currently in Pain? No/denies                                  Balance Exercises - 05/06/20 1526  Balance Exercises: Standing   Standing Eyes Opened Foam/compliant surface;Narrow base of support (BOS);Head turns    Standing Eyes Closed Foam/compliant surface;Narrow base of support (BOS);5 reps;10 secs    Rockerboard Anterior/posterior;Lateral;Head turns;10 reps;Intermittent UE support    Partial Tandem Stance Foam/compliant surface;Eyes open   head turns   Other Standing Exercises attempted skipping - pt unable even with cues    Other Standing Exercises Comments agility ladder needing mod VC for sequencing - difficulty with sequencing steps/hops                  PT Long Term Goals - 04/29/20 1646      PT LONG TERM GOAL #1   Title independent with HEP    Status On-going      PT LONG TERM GOAL #2   Title improve FGA to >/= 25/30 for improved balance and mobility    Status On-going       PT LONG TERM GOAL #3   Title demonstrate improved balance by standing with EC and feet together on compliant surface > 20 sec without LOB    Status On-going                 Plan - 05/06/20 1559    Clinical Impression Statement Tolerated session well today with difficulty with high level agility exercises today.  Will continue to benefit from PT to maximize function.  Overall vestibular symptoms and dizziness improving.    Personal Factors and Comorbidities Comorbidity 3+    Comorbidities aortic valve replacement, hernia repair with complication of sepsis, CVA July 2021 s/p carotid endarterectomy    Examination-Activity Limitations Locomotion Level;Stairs    Examination-Participation Restrictions Community Activity;Yard Work;Other   exercise   Stability/Clinical Decision Making Evolving/Moderate complexity    Rehab Potential Good    PT Frequency 2x / week   1-2x/wk   PT Duration 6 weeks    PT Treatment/Interventions ADLs/Self Care Home Management;Cryotherapy;Electrical Stimulation;Moist Heat;Balance training;Therapeutic exercise;Therapeutic activities;Functional mobility training;Stair training;Gait training;Neuromuscular re-education;Patient/family education;Energy conservation    PT Next Visit Plan progress strenth, endurance, balance, coodination as able., continue agility exercises    PT Home Exercise Plan Access Code: [email or web address].  Access Code: [email or web address].  Access Code: [email or web address]    Consulted and Agree with Plan of Care Patient;Family member/caregiver    Family Member Consulted Partner           Patient will benefit from skilled therapeutic intervention in order to improve the following deficits and impairments:  Abnormal gait, Decreased balance, Decreased mobility, Decreased endurance, Decreased activity tolerance  Visit Diagnosis: Unsteadiness on feet  Difficulty in walking, not elsewhere classified  Muscle weakness (generalized)  Other symptoms and signs involving the  musculoskeletal system     Problem List Patient Active Problem List   Diagnosis Date Noted  . Facial droop 03/05/2020  . Mild CAD   . Acute encephalopathy 11/18/2019  . Acute urinary retention 11/18/2019  . Post-operative state 11/18/2019  . Aphasia 08/07/2019  . S/P AVR (aortic valve replacement)   . Carotid artery stenosis   . Chronotropic incompetence   . Severe aortic stenosis   . Sinus bradycardia 08/12/2018  . Benign prostatic hyperplasia with nocturia 11/01/2012     Laureen Abrahams, PT, DPT 05/06/20 4:00 PM    [facility] [address] [place], [place], [address] Phone: [phone]   Fax:  [phone]  Name: Joshua Hess MRN: [id number] Date of Birth: 01/02/1941

## 2020-05-08 ENCOUNTER — Encounter: Payer: Self-pay | Admitting: Physical Therapy

## 2020-05-08 ENCOUNTER — Ambulatory Visit: Payer: Medicare HMO | Admitting: Physical Therapy

## 2020-05-08 ENCOUNTER — Other Ambulatory Visit: Payer: Self-pay

## 2020-05-08 DIAGNOSIS — M6281 Muscle weakness (generalized): Secondary | ICD-10-CM

## 2020-05-08 DIAGNOSIS — R262 Difficulty in walking, not elsewhere classified: Secondary | ICD-10-CM | POA: Diagnosis not present

## 2020-05-08 DIAGNOSIS — R2681 Unsteadiness on feet: Secondary | ICD-10-CM | POA: Diagnosis not present

## 2020-05-08 DIAGNOSIS — R29898 Other symptoms and signs involving the musculoskeletal system: Secondary | ICD-10-CM | POA: Diagnosis not present

## 2020-05-08 NOTE — Therapy (Signed)
[facility] Physical Therapy [address] [place], [place], [address] Phone: [phone]   Fax:  [phone]  Physical Therapy Treatment  Patient Details  Name: Joshua Hess MRN: [id number] Date of Birth: Dec 02, 1940 Referring Provider (PT): Gregor Hams, MD   Encounter Date: 05/08/2020   PT End of Session - 05/08/20 1509    Visit Number 8    Number of Visits 12    Date for PT Re-Evaluation 05/23/20    Authorization Type [employer] 12 visits through 11/30    Progress Note Due on Visit 10    PT Start Time 1428    PT Stop Time 1508    PT Time Calculation (min) 40 min    Equipment Utilized During Treatment Gait belt    Activity Tolerance Patient tolerated treatment well    Behavior During Therapy [facility] for tasks assessed/performed           Past Medical History:  Diagnosis Date  . Aphasia   . BPH (benign prostatic hyperplasia)   . Cancer ([place])    behind ear- melomona  . Carotid artery stenosis   . Chronotropic incompetence   . ED (erectile dysfunction)   . Elevated PSA   . HOH (hard of hearing)   . Mild CAD   . Plantar fasciitis   . Postoperative atrial fibrillation ([place])   . Severe aortic stenosis    a. s/p tissue AVR 10/2018.  Marland Kitchen Sinus bradycardia    baseline  . Wears glasses     Past Surgical History:  Procedure Laterality Date  . AORTIC VALVE REPLACEMENT N/A 11/10/2018   Procedure: AORTIC VALVE REPLACEMENT (AVR), USING 23MM INSPIRIS;  Surgeon: Gaye Pollack, MD;  Location: [place];  Service: Open Heart Surgery;  Laterality: N/A;  . ENDARTERECTOMY Left 03/08/2020   Procedure: ENDARTERECTOMY CAROTID;  Surgeon: Rosetta Posner, MD;  Location: [place];  Service: Vascular;  Laterality: Left;  . EXTERNAL EAR SURGERY     melanoma removed  . HERNIA REPAIR Right    inguinal  . INGUINAL HERNIA REPAIR Right 11/15/2019   Procedure: LAPAROSCOPIC RIGHT INGUINAL HERNIA REPAIR WITH MESH;  Surgeon: Coralie Keens, MD;  Location: [place];  Service: General;   Laterality: Right;  . RIGHT/LEFT HEART CATH AND CORONARY ANGIOGRAPHY N/A 09/29/2018   Procedure: RIGHT/LEFT HEART CATH AND CORONARY ANGIOGRAPHY;  Surgeon: Burnell Blanks, MD;  Location: [place] CV LAB;  Service: Cardiovascular;  Laterality: N/A;  . TEE WITHOUT CARDIOVERSION N/A 11/10/2018   Procedure: TRANSESOPHAGEAL ECHOCARDIOGRAM (TEE);  Surgeon: Gaye Pollack, MD;  Location: [place];  Service: Open Heart Surgery;  Laterality: N/A;  . TRANSCATHETER AORTIC VALVE REPLACEMENT, TRANSFEMORAL     PROCEDURE ATTEMPTED & ABORTED   . UMBILICAL HERNIA REPAIR N/A 11/15/2019   Procedure: UMBILICAL HERNIA REPAIR;  Surgeon: Coralie Keens, MD;  Location: [place];  Service: General;  Laterality: N/A;    There were no vitals filed for this visit.   Subjective Assessment - 05/08/20 1431    Subjective still feels a little off balance when running    Patient is accompained by: Family member   Mark   Patient Stated Goals ability to run a marathon    Currently in Pain? No/denies                             [facility] Adult PT Treatment/Exercise - 05/08/20 1432      Knee/Hip Exercises: Machines for Strengthening   Cybex  Leg Press 150# 3x10               Balance Exercises - 05/08/20 0001      Balance Exercises: Standing   SLS Foam/compliant surface;Upper extremity support 1;Upper extremity support 2   with ball roll   Step Ups Forward   onto BOSU x 10 reps each   Other Standing Exercises ball toss vertical/horizontal; jogging with visual targets, and sidestepping jog with crossovers    Other Standing Exercises Comments agility forwards and laterally around cones with mod cues for sequencing                  PT Long Term Goals - 04/29/20 1646      PT LONG TERM GOAL #1   Title independent with HEP    Status On-going      PT LONG TERM GOAL #2   Title improve FGA to >/= 25/30 for improved balance and mobility    Status On-going      PT LONG TERM GOAL #3    Title demonstrate improved balance by standing with EC and feet together on compliant surface > 20 sec without LOB    Status On-going                 Plan - 05/08/20 1509    Clinical Impression Statement Continues to have difficulty with following commands and retention of instructions with high level agility exercises.  Still with difficulty with compliant surface activities.  Will continue to benefit from PT to maximize function.    Personal Factors and Comorbidities Comorbidity 3+    Comorbidities aortic valve replacement, hernia repair with complication of sepsis, CVA July 2021 s/p carotid endarterectomy    Examination-Activity Limitations Locomotion Level;Stairs    Examination-Participation Restrictions Community Activity;Yard Work;Other   exercise   Stability/Clinical Decision Making Evolving/Moderate complexity    Rehab Potential Good    PT Frequency 2x / week   1-2x/wk   PT Duration 6 weeks    PT Treatment/Interventions ADLs/Self Care Home Management;Cryotherapy;Electrical Stimulation;Moist Heat;Balance training;Therapeutic exercise;Therapeutic activities;Functional mobility training;Stair training;Gait training;Neuromuscular re-education;Patient/family education;Energy conservation    PT Next Visit Plan progress strenth, endurance, balance, coodination as able., continue agility exercises    PT Home Exercise Plan Access Code: [email or web address].  Access Code: [email or web address]  Access Code: [email or web address]    Consulted and Agree with Plan of Care Patient;Family member/caregiver    Family Member Consulted Partner           Patient will benefit from skilled therapeutic intervention in order to improve the following deficits and impairments:  Abnormal gait, Decreased balance, Decreased mobility, Decreased endurance, Decreased activity tolerance  Visit Diagnosis: Unsteadiness on feet  Difficulty in walking, not elsewhere classified  Muscle weakness (generalized)  Other symptoms and signs  involving the musculoskeletal system     Problem List Patient Active Problem List   Diagnosis Date Noted  . Facial droop 03/05/2020  . Mild CAD   . Acute encephalopathy 11/18/2019  . Acute urinary retention 11/18/2019  . Post-operative state 11/18/2019  . Aphasia 08/07/2019  . S/P AVR (aortic valve replacement)   . Carotid artery stenosis   . Chronotropic incompetence   . Severe aortic stenosis   . Sinus bradycardia 08/12/2018  . Benign prostatic hyperplasia with nocturia 11/01/2012      Laureen Abrahams, PT, DPT 05/08/20 3:10 PM     [facility] [address] [place], [place], [address] Phone: [phone]   Fax:  [phone]  Name: Joshua Hess MRN: [id number] Date of Birth: [birth year]

## 2020-05-20 ENCOUNTER — Encounter: Payer: Self-pay | Admitting: Physical Therapy

## 2020-05-20 ENCOUNTER — Ambulatory Visit: Payer: Medicare HMO | Admitting: Physical Therapy

## 2020-05-20 ENCOUNTER — Other Ambulatory Visit: Payer: Self-pay

## 2020-05-20 DIAGNOSIS — R2681 Unsteadiness on feet: Secondary | ICD-10-CM | POA: Diagnosis not present

## 2020-05-20 DIAGNOSIS — R29898 Other symptoms and signs involving the musculoskeletal system: Secondary | ICD-10-CM | POA: Diagnosis not present

## 2020-05-20 DIAGNOSIS — R262 Difficulty in walking, not elsewhere classified: Secondary | ICD-10-CM

## 2020-05-20 DIAGNOSIS — N5201 Erectile dysfunction due to arterial insufficiency: Secondary | ICD-10-CM | POA: Diagnosis not present

## 2020-05-20 DIAGNOSIS — M6281 Muscle weakness (generalized): Secondary | ICD-10-CM

## 2020-05-20 NOTE — Therapy (Signed)
[facility] [address] [place], [place], [address] Phone: [phone]   Fax:  [phone]  Physical Therapy Treatment  Patient Details  Name: Joshua Hess MRN: [id number] Date of Birth: 1941-03-11 Referring Provider (PT): Gregor Hams, MD   Encounter Date: 05/20/2020   PT End of Session - 05/20/20 1514    Visit Number 9    Number of Visits 12    Date for PT Re-Evaluation 06/06/20   extended due to missed weeks   Authorization Type [employer] 12 visits through 11/30    Progress Note Due on Visit 10    PT Start Time 1430    PT Stop Time 1510    PT Time Calculation (min) 40 min    Equipment Utilized During Treatment Gait belt    Activity Tolerance Patient tolerated treatment well    Behavior During Therapy [facility] for tasks assessed/performed           Past Medical History:  Diagnosis Date  . Aphasia   . BPH (benign prostatic hyperplasia)   . Cancer ([place])    behind ear- melomona  . Carotid artery stenosis   . Chronotropic incompetence   . ED (erectile dysfunction)   . Elevated PSA   . HOH (hard of hearing)   . Mild CAD   . Plantar fasciitis   . Postoperative atrial fibrillation ([place])   . Severe aortic stenosis    a. s/p tissue AVR 10/2018.  Marland Kitchen Sinus bradycardia    baseline  . Wears glasses     Past Surgical History:  Procedure Laterality Date  . AORTIC VALVE REPLACEMENT N/A 11/10/2018   Procedure: AORTIC VALVE REPLACEMENT (AVR), USING 23MM INSPIRIS;  Surgeon: Gaye Pollack, MD;  Location: [place];  Service: Open Heart Surgery;  Laterality: N/A;  . ENDARTERECTOMY Left 03/08/2020   Procedure: ENDARTERECTOMY CAROTID;  Surgeon: Rosetta Posner, MD;  Location: [place];  Service: Vascular;  Laterality: Left;  . EXTERNAL EAR SURGERY     melanoma removed  . HERNIA REPAIR Right    inguinal  . INGUINAL HERNIA REPAIR Right 11/15/2019   Procedure: LAPAROSCOPIC RIGHT INGUINAL HERNIA REPAIR WITH MESH;  Surgeon: Coralie Keens, MD;  Location: [place];  Service: General;  Laterality: Right;  . RIGHT/LEFT HEART CATH AND CORONARY ANGIOGRAPHY N/A 09/29/2018   Procedure: RIGHT/LEFT HEART CATH AND CORONARY ANGIOGRAPHY;  Surgeon: Burnell Blanks, MD;  Location: [place] [facility];  Service: Cardiovascular;  Laterality: N/A;  . TEE WITHOUT CARDIOVERSION N/A 11/10/2018   Procedure: TRANSESOPHAGEAL ECHOCARDIOGRAM (TEE);  Surgeon: Gaye Pollack, MD;  Location: [place];  Service: Open Heart Surgery;  Laterality: N/A;  . TRANSCATHETER AORTIC VALVE REPLACEMENT, TRANSFEMORAL     PROCEDURE ATTEMPTED & ABORTED   . UMBILICAL HERNIA REPAIR N/A 11/15/2019   Procedure: UMBILICAL HERNIA REPAIR;  Surgeon: Coralie Keens, MD;  Location: [place];  Service: General;  Laterality: N/A;    There were no vitals filed for this visit.   Subjective Assessment - 05/20/20 1431    Subjective doing well, did exercises this morning.    Patient is accompained by: Family member   Mark   Patient Stated Goals ability to run a marathon    Currently in Pain? No/denies                   Vestibular Assessment - 05/20/20 0001      Orthostatics   BP supine (x 5 minutes) 153/83    HR supine (x 5  minutes) 58    BP standing (after 1 minute) 127/82    HR standing (after 1 minute) 74    BP standing (after 3 minutes) 125/85    HR standing (after 3 minutes) 54                     Vestibular Treatment/Exercise - 05/20/20 1442      Vestibular Treatment/Exercise   Vestibular Treatment Provided Gaze    Habituation Exercises Standing Diagonal Head Turns;180 degree Turns    Gaze Exercises X1 Viewing Horizontal;X1 Viewing Vertical      Standing Diagonal Head Turns   Number of Reps  10   bil   Symptiom Description  with ball to feet then reach overhead - mild symptoms      180 degree Turns   Number of Reps  10    Symptom Description  mild increase in symptoms      X1 Viewing Horizontal   Foot Position apart on foam    Time --   30 sec   Reps 1        X1 Viewing Vertical   Foot Position apart on foam    Time --   30 sec   Reps 1                 PT Education - 05/20/20 1514    Education Details orthostatic hypotension    Person(s) Educated Patient;Spouse    Methods Explanation;Handout    Comprehension Verbalized understanding               PT Long Term Goals - 04/29/20 1646      PT LONG TERM GOAL #1   Title independent with HEP    Status On-going      PT LONG TERM GOAL #2   Title improve FGA to >/= 25/30 for improved balance and mobility    Status On-going      PT LONG TERM GOAL #3   Title demonstrate improved balance by standing with EC and feet together on compliant surface > 20 sec without LOB    Status On-going                 Plan - 05/20/20 1514    Clinical Impression Statement Pt positive for orthostatic hypotension today so recommended he discuss with his PCP.  Continues to have high level balance deficits and will continue to benefit from PT to maximize function.    Personal Factors and Comorbidities Comorbidity 3+    Comorbidities aortic valve replacement, hernia repair with complication of sepsis, CVA July 2021 s/p carotid endarterectomy    Examination-Activity Limitations Locomotion Level;Stairs    Examination-Participation Restrictions Community Activity;Yard Work;Other   exercise   Stability/Clinical Decision Making Evolving/Moderate complexity    Rehab Potential Good    PT Frequency 2x / week   1-2x/wk   PT Duration 6 weeks    PT Treatment/Interventions ADLs/Self Care Home Management;Cryotherapy;Electrical Stimulation;Moist Heat;Balance training;Therapeutic exercise;Therapeutic activities;Functional mobility training;Stair training;Gait training;Neuromuscular re-education;Patient/family education;Energy conservation    PT Next Visit Plan progress strenth, endurance, balance, coodination as able., continue agility exercises    PT Home Exercise Plan Access Code: [email or web address].  Access  Code: [email or web address].  Access Code: [email or web address]    Consulted and Agree with Plan of Care Patient;Family member/caregiver    Family Member Consulted Partner           Patient will benefit from skilled therapeutic intervention in order to improve the following deficits and impairments:  Abnormal gait, Decreased balance, Decreased mobility, Decreased endurance, Decreased activity tolerance  Visit Diagnosis: Unsteadiness on feet  Difficulty in walking, not elsewhere classified  Muscle weakness (generalized)  Other symptoms and signs involving the musculoskeletal system     Problem List Patient Active Problem List   Diagnosis Date Noted  . Facial droop 03/05/2020  . Mild CAD   . Acute encephalopathy 11/18/2019  . Acute urinary retention 11/18/2019  . Post-operative state 11/18/2019  . Aphasia 08/07/2019  . S/P AVR (aortic valve replacement)   . Carotid artery stenosis   . Chronotropic incompetence   . Severe aortic stenosis   . Sinus bradycardia 08/12/2018  . Benign prostatic hyperplasia with nocturia 11/01/2012      Laureen Abrahams, PT, DPT 05/20/20 3:16 PM     [facility] [address] [place], [place], [address] Phone: [phone]   Fax:  [phone]  Name: ORVELL CAREAGA MRN: [id number] Date of Birth: August 31, 1940

## 2020-05-22 ENCOUNTER — Ambulatory Visit: Payer: Medicare HMO | Admitting: Physical Therapy

## 2020-05-22 ENCOUNTER — Encounter: Payer: Self-pay | Admitting: Physical Therapy

## 2020-05-22 ENCOUNTER — Other Ambulatory Visit: Payer: Self-pay

## 2020-05-22 DIAGNOSIS — R2681 Unsteadiness on feet: Secondary | ICD-10-CM

## 2020-05-22 DIAGNOSIS — M6281 Muscle weakness (generalized): Secondary | ICD-10-CM

## 2020-05-22 DIAGNOSIS — R29898 Other symptoms and signs involving the musculoskeletal system: Secondary | ICD-10-CM

## 2020-05-22 DIAGNOSIS — R262 Difficulty in walking, not elsewhere classified: Secondary | ICD-10-CM | POA: Diagnosis not present

## 2020-05-22 NOTE — Therapy (Signed)
[facility] [address] [place], [place], [address] Phone: [phone]   Fax:  [phone]  Physical Therapy Treatment/Progress Note Progress Note Reporting Period 04/11/20 to 05/22/20  See note below for Objective Data and Assessment of Progress/Goals.       Patient Details  Name: Joshua Hess MRN: [id number] Date of Birth: 05/17/1941 Referring Provider (PT): Gregor Hams, MD   Encounter Date: 05/22/2020   PT End of Session - 05/22/20 1510    Visit Number 10    Number of Visits 12    Date for PT Re-Evaluation 06/06/20   extended due to missed weeks   Authorization Type [employer] 12 visits through 11/30    Progress Note Due on Visit 10    PT Start Time 1430    PT Stop Time 1510    PT Time Calculation (min) 40 min    Equipment Utilized During Treatment Gait belt    Activity Tolerance Patient tolerated treatment well    Behavior During Therapy WFL for tasks assessed/performed           Past Medical History:  Diagnosis Date  . Aphasia   . BPH (benign prostatic hyperplasia)   . Cancer ([place])    behind ear- melomona  . Carotid artery stenosis   . Chronotropic incompetence   . ED (erectile dysfunction)   . Elevated PSA   . HOH (hard of hearing)   . Mild CAD   . Plantar fasciitis   . Postoperative atrial fibrillation ([place])   . Severe aortic stenosis    a. s/p tissue AVR 10/2018.  Marland Kitchen Sinus bradycardia    baseline  . Wears glasses     Past Surgical History:  Procedure Laterality Date  . AORTIC VALVE REPLACEMENT N/A 11/10/2018   Procedure: AORTIC VALVE REPLACEMENT (AVR), USING 23MM INSPIRIS;  Surgeon: Gaye Pollack, MD;  Location: [place];  Service: Open Heart Surgery;  Laterality: N/A;  . ENDARTERECTOMY Left 03/08/2020   Procedure: ENDARTERECTOMY CAROTID;  Surgeon: Rosetta Posner, MD;  Location: [place];  Service: Vascular;  Laterality: Left;  . EXTERNAL EAR SURGERY     melanoma removed  . HERNIA REPAIR Right    inguinal  .  INGUINAL HERNIA REPAIR Right 11/15/2019   Procedure: LAPAROSCOPIC RIGHT INGUINAL HERNIA REPAIR WITH MESH;  Surgeon: Coralie Keens, MD;  Location: [place];  Service: General;  Laterality: Right;  . RIGHT/LEFT HEART CATH AND CORONARY ANGIOGRAPHY N/A 09/29/2018   Procedure: RIGHT/LEFT HEART CATH AND CORONARY ANGIOGRAPHY;  Surgeon: Burnell Blanks, MD;  Location: [place] [facility];  Service: Cardiovascular;  Laterality: N/A;  . TEE WITHOUT CARDIOVERSION N/A 11/10/2018   Procedure: TRANSESOPHAGEAL ECHOCARDIOGRAM (TEE);  Surgeon: Gaye Pollack, MD;  Location: [place];  Service: Open Heart Surgery;  Laterality: N/A;  . TRANSCATHETER AORTIC VALVE REPLACEMENT, TRANSFEMORAL     PROCEDURE ATTEMPTED & ABORTED   . UMBILICAL HERNIA REPAIR N/A 11/15/2019   Procedure: UMBILICAL HERNIA REPAIR;  Surgeon: Coralie Keens, MD;  Location: [place];  Service: General;  Laterality: N/A;    There were no vitals filed for this visit.   Subjective Assessment - 05/22/20 1436    Subjective cleaned out flower beds yesterday, overall doing well    Patient is accompained by: Family member   Joshua Hess   Patient Stated Goals ability to run a marathon    Currently in Pain? No/denies  [place] Adult PT Treatment/Exercise - 05/22/20 1437      Lumbar Exercises: Stretches   Single Knee to Chest Stretch Right;Left;2 reps;30 seconds      Knee/Hip Exercises: Stretches   Piriformis Stretch Right;Left;3 reps;30 seconds      Knee/Hip Exercises: Machines for Strengthening   Cybex Leg Press 150# 3x10    Total Gym Leg Press hopping single leg/double leg 75# x 10-15 reps each      Knee/Hip Exercises: Plyometrics   Bilateral Jumping 10 reps    Bilateral Jumping Limitations level ground forward / lateral hopping    Other Plyometric Exercises step hop forward/backward - unable to perform > 2-3 reps due to sequencing and cognitive difficulty      Knee/Hip Exercises: Standing   Other  Standing Knee Exercises standing DL deadlift x 10 reps bil - mod cues for technique -5# KB    Other Standing Knee Exercises attempted walking SL deadlifts - difficulty with sequencing                        PT Long Term Goals - 05/22/20 1511      PT LONG TERM GOAL #1   Title independent with HEP    Status On-going    Target Date 06/06/20      PT LONG TERM GOAL #2   Title improve FGA to >/= 25/30 for improved balance and mobility    Status On-going    Target Date 06/06/20      PT LONG TERM GOAL #3   Title demonstrate improved balance by standing with EC and feet together on compliant surface > 20 sec without LOB    Status On-going    Target Date 06/06/20                 Plan - 05/22/20 1511    Clinical Impression Statement All LTGs are ongoing at this time due to continued difficulty with high level balance activities.  Discussed ways to progress activity at home to work on progressing distance and speed with running.  Will continue to beneffit from PT to maximize funciton.    Personal Factors and Comorbidities Comorbidity 3+    Comorbidities aortic valve replacement, hernia repair with complication of sepsis, CVA July 2021 s/p carotid endarterectomy    Examination-Activity Limitations Locomotion Level;Stairs    Examination-Participation Restrictions Community Activity;Yard Work;Other   exercise   Stability/Clinical Decision Making Evolving/Moderate complexity    Rehab Potential Good    PT Frequency 2x / week   1-2x/wk   PT Duration 6 weeks    PT Treatment/Interventions ADLs/Self Care Home Management;Cryotherapy;Electrical Stimulation;Moist Heat;Balance training;Therapeutic exercise;Therapeutic activities;Functional mobility training;Stair training;Gait training;Neuromuscular re-education;Patient/family education;Energy conservation    PT Next Visit Plan progress strenth, endurance, balance, coodination as able., continue agility exercises    PT Home Exercise  Plan Access Code: [email or web address].  Access Code: [email or web address].  Access Code: [email or web address]    Consulted and Agree with Plan of Care Patient;Family member/caregiver    Family Member Consulted Partner           Patient will benefit from skilled therapeutic intervention in order to improve the following deficits and impairments:  Abnormal gait, Decreased balance, Decreased mobility, Decreased endurance, Decreased activity tolerance  Visit Diagnosis: Unsteadiness on feet  Difficulty in walking, not elsewhere classified  Muscle weakness (generalized)  Other symptoms and signs involving the musculoskeletal system     Problem List Patient Active Problem List   Diagnosis Date Noted  .  Facial droop 03/05/2020  . Mild CAD   . Acute encephalopathy 11/18/2019  . Acute urinary retention 11/18/2019  . Post-operative state 11/18/2019  . Aphasia 08/07/2019  . S/P AVR (aortic valve replacement)   . Carotid artery stenosis   . Chronotropic incompetence   . Severe aortic stenosis   . Sinus bradycardia 08/12/2018  . Benign prostatic hyperplasia with nocturia 11/01/2012     Laureen Abrahams, PT, DPT 05/22/20 3:13 PM       [facility] [address] [place], [place], [address] Phone: [phone]   Fax:  [phone]  Name: Joshua Hess MRN: [id number] Date of Birth: 29-Jul-1940

## 2020-05-27 ENCOUNTER — Encounter: Payer: Self-pay | Admitting: Neurology

## 2020-05-27 ENCOUNTER — Ambulatory Visit: Payer: Medicare HMO | Admitting: Neurology

## 2020-05-27 VITALS — BP 163/72 | HR 67 | Ht 68.0 in | Wt 176.5 lb

## 2020-05-27 DIAGNOSIS — Z85820 Personal history of malignant melanoma of skin: Secondary | ICD-10-CM | POA: Diagnosis not present

## 2020-05-27 DIAGNOSIS — Z9889 Other specified postprocedural states: Secondary | ICD-10-CM

## 2020-05-27 DIAGNOSIS — Z85828 Personal history of other malignant neoplasm of skin: Secondary | ICD-10-CM | POA: Diagnosis not present

## 2020-05-27 DIAGNOSIS — C44722 Squamous cell carcinoma of skin of right lower limb, including hip: Secondary | ICD-10-CM | POA: Diagnosis not present

## 2020-05-27 DIAGNOSIS — C44729 Squamous cell carcinoma of skin of left lower limb, including hip: Secondary | ICD-10-CM | POA: Diagnosis not present

## 2020-05-27 DIAGNOSIS — R4701 Aphasia: Secondary | ICD-10-CM | POA: Diagnosis not present

## 2020-05-27 DIAGNOSIS — L570 Actinic keratosis: Secondary | ICD-10-CM | POA: Diagnosis not present

## 2020-05-27 DIAGNOSIS — L821 Other seborrheic keratosis: Secondary | ICD-10-CM | POA: Diagnosis not present

## 2020-05-27 DIAGNOSIS — L819 Disorder of pigmentation, unspecified: Secondary | ICD-10-CM | POA: Diagnosis not present

## 2020-05-27 DIAGNOSIS — D692 Other nonthrombocytopenic purpura: Secondary | ICD-10-CM | POA: Diagnosis not present

## 2020-05-27 DIAGNOSIS — D485 Neoplasm of uncertain behavior of skin: Secondary | ICD-10-CM | POA: Diagnosis not present

## 2020-05-27 DIAGNOSIS — L814 Other melanin hyperpigmentation: Secondary | ICD-10-CM | POA: Diagnosis not present

## 2020-05-27 NOTE — Progress Notes (Signed)
HISTORY OF PRESENT ILLNESS: Joshua Hess is a 79 year old male, seen in request by his primary care physician Dr. Shelia Media, [facility] Jew for evaluation of transient speech difficulty, he is accompanied by his spouse [name] at today's visit on August 07, 2019.  I have reviewed and summarized the referring note from the referring physician.  He has past medical history of hyperlipidemia, prostate cancer, carotid artery stenosis, aortic valve replacement, taking aspirin 81 mg daily.  On July 31, 2019, when he tried to communicate with [name], he was noted to have word finding difficulties, he denied loss of consciousness, there was no confusion, apparently he is frustrated by his difficulty, recurrent similar symptoms last about few minutes, he was taken to the emergency room,  I personally reviewed CT head without contrast on July 31, 2019: No evidence of acute intracranial abnormality, supratentorium small vessel disease  Laboratory evaluation seen January 2021: CMP showed normal creatinine of 1, hemoglobin of 14.6,  Echocardiogram on January 26, 2019, normal ejection fraction 50 to 55%, impaired relaxation, anterior and lateral wall hypokinesia, mild mitral valve regurgitation, aortic root and ascending aorta are normal, prosthetic aortic valve was not well visualized, peak and mean gradients through the prosthesis at 33 and 38mmHg respectively  He is now back to his baseline, exercise regularly, including running and speed walking each day, about 3 weeks ago in early January 2021, while running, he fell down, landed on his right shoulder, still complains of right chest pain  Ultrasound of carotid artery was done by his primary care physician, report pending  Patient has mild hard of hearing, history is mainly from [place], who reported patient began to have mild memory loss since 2018, he tends to misplace things, sometimes word finding difficulties, he retired from [employer], 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  His difficulty become most noticeable following general anesthesia and aortic valve replacement in April, over the past few months all has improved.   UPDATE May 27 2020: He was admitted to hospital on March 05, 2020, when he was noted significant right facial droop March 04, 2020 after running for 3 miles  I personally reviewed MRI of brain on March 06, 2020, no acute abnormality, generalized atrophy, mild supratentorium small vessel disease CT angiogram of head and neck showed 70% atheromatous stenosis at the left carotid bifurcation, 50% stenosis at the origin of right subclavian artery, moderate 50% stenosis at the origin of left vertebral artery  Was seen by stroke physician Dr. Erlinda Hong, had left carotid endarterectomy by Dr. Eddie Dibbles early March 08, 2020, now recovering well, taking aspirin 81 mg daily, still running 3 miles a day, complains of intermittent bilateral hands tremor, but no weakness.   REVIEW OF SYSTEMS: Out of a complete 14 system review of symptoms, the patient complains only of the following symptoms, and all other reviewed systems are negative.  Aphasia, hard of hearing  ALLERGIES: Allergies  Allergen Reactions  . Flomax [Tamsulosin] Other (See Comments)    Made feel foggy headed    HOME MEDICATIONS: Outpatient Medications Prior to Visit  Medication Sig Dispense Refill  . ALPRAZolam (XANAX) 0.25 MG tablet Take 0.25 mg by mouth at bedtime as needed for anxiety.    Marland Kitchen amoxicillin (AMOXIL) 500 MG capsule TAKE 4 CAPSULES BY MOUTH ONE TO TWO HOURS PRIOR TO DENTAL APPOINTMENT 16 capsule 1  . Apoaequorin (PREVAGEN) 10 MG CAPS Take 10 mg by mouth daily.     Marland Kitchen aspirin EC 81 MG EC  tablet Take 1 tablet (81 mg total) by mouth daily. Swallow whole. 21 tablet 0  . finasteride (PROSCAR) 5 MG tablet Take 1 tablet (5 mg total) by mouth daily. 30 tablet 0  . Pitavastatin Magnesium (ZYPITAMAG) 4 MG TABS Take 4 mg by mouth daily. 90 tablet 1  .  polyethylene glycol (MIRALAX / GLYCOLAX) 17 g packet Take 17 g by mouth daily. (Patient taking differently: Take 17 g by mouth daily as needed for mild constipation. ) 14 each 0  . sertraline (ZOLOFT) 100 MG tablet Take 50 mg by mouth daily.     . sildenafil (REVATIO) 20 MG tablet Take 20 mg by mouth daily as needed (ED).    . traZODone (DESYREL) 100 MG tablet Take 50-100 mg by mouth at bedtime.      No facility-administered medications prior to visit.    PAST MEDICAL HISTORY: Past Medical History:  Diagnosis Date  . Aphasia   . BPH (benign prostatic hyperplasia)   . Cancer ([place])    behind ear- melomona  . Carotid artery stenosis   . Chronotropic incompetence   . ED (erectile dysfunction)   . Elevated PSA   . HOH (hard of hearing)   . Mild CAD   . Plantar fasciitis   . Postoperative atrial fibrillation ([place])   . Severe aortic stenosis    a. s/p tissue AVR 10/2018.  Marland Kitchen Sinus bradycardia    baseline  . Stroke ([place]) 03/05/2020   TIA  . Wears glasses     PAST SURGICAL HISTORY: Past Surgical History:  Procedure Laterality Date  . AORTIC VALVE REPLACEMENT N/A 11/10/2018   Procedure: AORTIC VALVE REPLACEMENT (AVR), USING 23MM INSPIRIS;  Surgeon: Gaye Pollack, MD;  Location: [facility];  Service: Open Heart Surgery;  Laterality: N/A;  . ENDARTERECTOMY Left 03/08/2020   Procedure: ENDARTERECTOMY CAROTID;  Surgeon: Rosetta Posner, MD;  Location: [facility];  Service: Vascular;  Laterality: Left;  . EXTERNAL EAR SURGERY     melanoma removed  . HERNIA REPAIR Right    inguinal  . INGUINAL HERNIA REPAIR Right 11/15/2019   Procedure: LAPAROSCOPIC RIGHT INGUINAL HERNIA REPAIR WITH MESH;  Surgeon: Coralie Keens, MD;  Location: [facility];  Service: General;  Laterality: Right;  . RIGHT/LEFT HEART CATH AND CORONARY ANGIOGRAPHY N/A 09/29/2018   Procedure: RIGHT/LEFT HEART CATH AND CORONARY ANGIOGRAPHY;  Surgeon: Burnell Blanks, MD;  Location: [facility];  Service: Cardiovascular;   Laterality: N/A;  . TEE WITHOUT CARDIOVERSION N/A 11/10/2018   Procedure: TRANSESOPHAGEAL ECHOCARDIOGRAM (TEE);  Surgeon: Gaye Pollack, MD;  Location: [facility];  Service: Open Heart Surgery;  Laterality: N/A;  . TRANSCATHETER AORTIC VALVE REPLACEMENT, TRANSFEMORAL     PROCEDURE ATTEMPTED & ABORTED   . UMBILICAL HERNIA REPAIR N/A 11/15/2019   Procedure: UMBILICAL HERNIA REPAIR;  Surgeon: Coralie Keens, MD;  Location: [facility];  Service: General;  Laterality: N/A;    FAMILY HISTORY: Family History  Problem Relation Age of Onset  . Heart attack Mother   . Other Father        unsure of history  . Heart disease Brother   . Dementia Sister     SOCIAL HISTORY: Social History   Socioeconomic History  . Marital status: Married    Spouse name: Not on file  . Number of children: 0  . Years of education: college  . Highest education level: Bachelor's degree (e.g., BA, AB, BS)  Occupational History  . Occupation: Retired-Human Resources  Tobacco Use  .  Smoking status: Former Smoker    Years: 1.00    Quit date: 1970    Years since quitting: 51.9  . Smokeless tobacco: Never Used  Vaping Use  . Vaping Use: Never used  Substance and Sexual Activity  . Alcohol use: No  . Drug use: No  . Sexual activity: Not on file  Other Topics Concern  . Not on file  Social History Narrative   Caffeine: 2 cups daily   Right-handed.   Lives with legal spouse, [name].    Social Determinants of Health   Financial Resource Strain:   . Difficulty of Paying Living Expenses: Not on file  Food Insecurity:   . Worried About Charity fundraiser in the Last Year: Not on file  . Ran Out of Food in the Last Year: Not on file  Transportation Needs:   . Lack of Transportation (Medical): Not on file  . Lack of Transportation (Non-Medical): Not on file  Physical Activity:   . Days of Exercise per Week: Not on file  . Minutes of Exercise per Session: Not on file  Stress:   . Feeling of Stress : Not on file   Social Connections:   . Frequency of Communication with Friends and Family: Not on file  . Frequency of Social Gatherings with Friends and Family: Not on file  . Attends Religious Services: Not on file  . Active Member of Clubs or Organizations: Not on file  . Attends Archivist Meetings: Not on file  . Marital Status: Not on file  Intimate Partner Violence:   . Fear of Current or Ex-Partner: Not on file  . Emotionally Abused: Not on file  . Physically Abused: Not on file  . Sexually Abused: Not on file   PHYSICAL EXAM  Vitals:   05/27/20 1505  BP: (!) 163/72  Pulse: 67  Weight: 176 lb 8 oz (80.1 kg)  Height: 5\' 8"  (1.727 m)    PHYSICAL EXAMNIATION:  Gen: NAD, conversant, well nourised, well groomed                     Cardiovascular: Regular rate rhythm, no peripheral edema, warm, nontender. Eyes: Conjunctivae clear without exudates or hemorrhage Neck: Supple, no carotid bruits. Pulmonary: Clear to auscultation bilaterally   NEUROLOGICAL EXAM:  MENTAL STATUS: Speech/Cognition: Awake, alert, normal speech, oriented to history taking and casual conversation.  CRANIAL NERVES: CN II: Visual fields are full to confrontation.  Pupils are round equal and briskly reactive to light. CN III, IV, VI: extraocular movement are normal. No ptosis. CN V: Facial sensation is intact to light touch. CN VII: Face is symmetric with normal eye closure and smile. CN VIII: Hearing is normal to casual conversation CN IX, X: Palate elevates symmetrically. Phonation is normal. CN XI: Head turning and shoulder shrug are intact CN XII: Tongue is midline with normal movements and no atrophy.  MOTOR: Muscle bulk and tone are normal. Muscle strength is normal.  REFLEXES: Reflexes are 2  and symmetric at the biceps, triceps, knees and ankles. Plantar responses are flexor.  SENSORY: Intact to light touch, pinprick, positional and vibratory sensation at fingers and  toes.  COORDINATION: There is no trunk or limb ataxia.    GAIT/STANCE: Posture is normal. Gait is steady with normal steps, base, arm swing and turning.    DIAGNOSTIC DATA (LABS, IMAGING, TESTING) - I reviewed patient records, labs, notes, testing and imaging myself where available.  Lab Results  Component  Value Date   WBC 8.9 04/29/2020   HGB 14.9 04/29/2020   HCT 42.3 04/29/2020   MCV 92.4 04/29/2020   PLT 198 04/29/2020      Component Value Date/Time   NA 139 04/29/2020 2114   NA 142 09/26/2018 1142   K 3.4 (L) 04/29/2020 2114   CL 102 04/29/2020 2114   CO2 21 (L) 04/29/2020 2114   GLUCOSE 101 (H) 04/29/2020 2114   BUN 24 (H) 04/29/2020 2114   BUN 25 09/26/2018 1142   CREATININE 1.01 04/29/2020 2114   CREATININE 0.91 11/01/2012 1209   CALCIUM 9.4 04/29/2020 2114   PROT 5.2 (L) 03/09/2020 0706   ALBUMIN 2.9 (L) 03/09/2020 0706   AST 16 03/09/2020 0706   ALT 14 03/09/2020 0706   ALKPHOS 40 03/09/2020 0706   BILITOT 0.6 03/09/2020 0706   GFRNONAA >60 04/29/2020 2114   GFRAA >60 03/09/2020 0706   Lab Results  Component Value Date   CHOL 109 03/06/2020   HDL 47 03/06/2020   LDLCALC 52 03/06/2020   TRIG 49 03/06/2020   CHOLHDL 2.3 03/06/2020   Lab Results  Component Value Date   HGBA1C 5.2 03/06/2020   Lab Results  Component Value Date   DYJWLKHV74 734 05/23/2015   Lab Results  Component Value Date   TSH 3.435 03/06/2020      ASSESSMENT AND PLAN 79 y.o. year old male  History of left hemispheric TIA, High-grade stenosis at left internal carotid artery, status post left internal carotid endarterectomy by Dr. Donnetta Hutching on March 08, 2020, recovering well, on aspirin 81 mg daily, History of aortic valve replacement for aortic stenosis, bovine valve November 10, 2018, on aspirin alone  Vascular risk factor of aging, hypertension, hyperlipidemia, former smoker, mild coronary artery disease, status post aortic stenosis, bovine valve replacement  Continue  moderate exercise, aspirin 81 mg daily    Marcial Pacas, M.D. Ph.D.  [facility] [place], [place] [address] Phone: [phone] Fax:      [phone]

## 2020-05-28 ENCOUNTER — Encounter: Payer: Medicare HMO | Admitting: Physical Therapy

## 2020-05-28 DIAGNOSIS — E7800 Pure hypercholesterolemia, unspecified: Secondary | ICD-10-CM | POA: Diagnosis not present

## 2020-05-28 DIAGNOSIS — H9193 Unspecified hearing loss, bilateral: Secondary | ICD-10-CM | POA: Diagnosis not present

## 2020-05-28 DIAGNOSIS — N529 Male erectile dysfunction, unspecified: Secondary | ICD-10-CM | POA: Diagnosis not present

## 2020-05-29 ENCOUNTER — Encounter: Payer: Self-pay | Admitting: Neurology

## 2020-05-30 ENCOUNTER — Encounter: Payer: Medicare HMO | Admitting: Physical Therapy

## 2020-06-03 ENCOUNTER — Ambulatory Visit: Payer: Medicare HMO | Admitting: Physical Therapy

## 2020-06-03 ENCOUNTER — Other Ambulatory Visit: Payer: Self-pay

## 2020-06-03 ENCOUNTER — Encounter: Payer: Self-pay | Admitting: Physical Therapy

## 2020-06-03 DIAGNOSIS — M6281 Muscle weakness (generalized): Secondary | ICD-10-CM

## 2020-06-03 DIAGNOSIS — R262 Difficulty in walking, not elsewhere classified: Secondary | ICD-10-CM | POA: Diagnosis not present

## 2020-06-03 DIAGNOSIS — R29898 Other symptoms and signs involving the musculoskeletal system: Secondary | ICD-10-CM

## 2020-06-03 DIAGNOSIS — R2681 Unsteadiness on feet: Secondary | ICD-10-CM

## 2020-06-03 NOTE — Therapy (Signed)
[facility] [address] [place], [place], [address] Phone: [phone]   Fax:  [phone]  Physical Therapy Treatment  Patient Details  Name: Joshua Hess MRN: [id number] Date of Birth: 05/27/1941 Referring Provider (PT): Gregor Hams, MD   Encounter Date: 06/03/2020   PT End of Session - 06/03/20 1502    Visit Number 11    Number of Visits 12    Date for PT Re-Evaluation 06/06/20   extended due to missed weeks   Authorization Type [employer] 12 visits through 11/30    Progress Note Due on Visit 20    PT Start Time 1419    PT Stop Time 1500    PT Time Calculation (min) 41 min    Equipment Utilized During Treatment Gait belt    Activity Tolerance Patient tolerated treatment well    Behavior During Therapy [facility] for tasks assessed/performed           Past Medical History:  Diagnosis Date  . Aphasia   . BPH (benign prostatic hyperplasia)   . Cancer ([place])    behind ear- melomona  . Carotid artery stenosis   . Chronotropic incompetence   . ED (erectile dysfunction)   . Elevated PSA   . HOH (hard of hearing)   . Mild CAD   . Plantar fasciitis   . Postoperative atrial fibrillation ([place])   . Severe aortic stenosis    a. s/p tissue AVR 10/2018.  Marland Kitchen Sinus bradycardia    baseline  . Stroke ([place]) 03/05/2020   TIA  . Wears glasses     Past Surgical History:  Procedure Laterality Date  . AORTIC VALVE REPLACEMENT N/A 11/10/2018   Procedure: AORTIC VALVE REPLACEMENT (AVR), USING 23MM INSPIRIS;  Surgeon: Gaye Pollack, MD;  Location: [facility];  Service: Open Heart Surgery;  Laterality: N/A;  . ENDARTERECTOMY Left 03/08/2020   Procedure: ENDARTERECTOMY CAROTID;  Surgeon: Rosetta Posner, MD;  Location: [facility];  Service: Vascular;  Laterality: Left;  . EXTERNAL EAR SURGERY     melanoma removed  . HERNIA REPAIR Right    inguinal  . INGUINAL HERNIA REPAIR Right 11/15/2019   Procedure: LAPAROSCOPIC RIGHT INGUINAL HERNIA REPAIR WITH MESH;  Surgeon:  Coralie Keens, MD;  Location: [facility];  Service: General;  Laterality: Right;  . RIGHT/LEFT HEART CATH AND CORONARY ANGIOGRAPHY N/A 09/29/2018   Procedure: RIGHT/LEFT HEART CATH AND CORONARY ANGIOGRAPHY;  Surgeon: Burnell Blanks, MD;  Location: [facility];  Service: Cardiovascular;  Laterality: N/A;  . TEE WITHOUT CARDIOVERSION N/A 11/10/2018   Procedure: TRANSESOPHAGEAL ECHOCARDIOGRAM (TEE);  Surgeon: Gaye Pollack, MD;  Location: [facility];  Service: Open Heart Surgery;  Laterality: N/A;  . TRANSCATHETER AORTIC VALVE REPLACEMENT, TRANSFEMORAL     PROCEDURE ATTEMPTED & ABORTED   . UMBILICAL HERNIA REPAIR N/A 11/15/2019   Procedure: UMBILICAL HERNIA REPAIR;  Surgeon: Coralie Keens, MD;  Location: [facility];  Service: General;  Laterality: N/A;    There were no vitals filed for this visit.   Subjective Assessment - 06/03/20 1419    Subjective doing well; no complaints    Patient is accompained by: Family member   Mark   Patient Stated Goals ability to run a marathon    Currently in Pain? No/denies                             [facility] Adult PT Treatment/Exercise - 06/03/20 1421  Knee/Hip Exercises: Machines for Strengthening   Cybex Leg Press 150# 3x10      Knee/Hip Exercises: Plyometrics   Other Plyometric Exercises fast feet forward backward 3x30 sec; skaters 3x30 sec, lateral hops and forward/backward hops 2x10 reps each.  "t" jump CCW x 2 reps.  attempted bil jumping onto 2" box - unable                  PT Education - 06/03/20 1502    Education Details agility HEP    Person(s) Educated Patient;Spouse    Methods Explanation;Demonstration;Handout    Comprehension Verbalized understanding;Returned demonstration;Need further instruction               PT Long Term Goals - 05/22/20 1511      PT LONG TERM GOAL #1   Title independent with HEP    Status On-going    Target Date 06/06/20      PT LONG TERM GOAL #2   Title improve FGA  to >/= 25/30 for improved balance and mobility    Status On-going    Target Date 06/06/20      PT LONG TERM GOAL #3   Title demonstrate improved balance by standing with EC and feet together on compliant surface > 20 sec without LOB    Status On-going    Target Date 06/06/20                 Plan - 06/03/20 1503    Clinical Impression Statement Session today focused on agility exercises and ones to work on at home safely.  Pt has difficulty processing quick agility exercises and need frequent feedback for sequencing, and requests to stop early.  Plan for d/c next visit.    Personal Factors and Comorbidities Comorbidity 3+    Comorbidities aortic valve replacement, hernia repair with complication of sepsis, CVA July 2021 s/p carotid endarterectomy    Examination-Activity Limitations Locomotion Level;Stairs    Examination-Participation Restrictions Community Activity;Yard Work;Other   exercise   Stability/Clinical Decision Making Evolving/Moderate complexity    Rehab Potential Good    PT Frequency 2x / week   1-2x/wk   PT Duration 6 weeks    PT Treatment/Interventions ADLs/Self Care Home Management;Cryotherapy;Electrical Stimulation;Moist Heat;Balance training;Therapeutic exercise;Therapeutic activities;Functional mobility training;Stair training;Gait training;Neuromuscular re-education;Patient/family education;Energy conservation    PT Next Visit Plan progress strenth, endurance, balance, coodination as able., continue agility exercises, prepare for d/c    PT Home Exercise Plan Access Code: [email or web address].  Access Code: [email or web address].  Access Code: [email or web address]    Consulted and Agree with Plan of Care Patient;Family member/caregiver    Family Member Consulted Partner           Patient will benefit from skilled therapeutic intervention in order to improve the following deficits and impairments:  Abnormal gait, Decreased balance, Decreased mobility, Decreased endurance, Decreased activity  tolerance  Visit Diagnosis: Unsteadiness on feet  Difficulty in walking, not elsewhere classified  Muscle weakness (generalized)  Other symptoms and signs involving the musculoskeletal system     Problem List Patient Active Problem List   Diagnosis Date Noted  . History of left-sided carotid endarterectomy 05/27/2020  . Facial droop 03/05/2020  . Mild CAD   . Acute encephalopathy 11/18/2019  . Acute urinary retention 11/18/2019  . Post-operative state 11/18/2019  . Aphasia 08/07/2019  . S/P AVR (aortic valve replacement)   . Carotid artery stenosis   . Chronotropic incompetence   . Severe aortic stenosis   . Sinus bradycardia 08/12/2018  .  Benign prostatic hyperplasia with nocturia 11/01/2012      Laureen Abrahams, PT, DPT 06/03/20 3:05 PM     [facility] [address] [place], [place], [address] Phone: [phone]   Fax:  [phone]  Name: Joshua Hess MRN: [id number] Date of Birth: 1940/10/07

## 2020-06-03 NOTE — Patient Instructions (Signed)
Access Code: [email or web address] URL: [email or web address] Date: 06/03/2020 Prepared by: Faustino Congress  Exercises Squat with Chair Touch - 2 x daily - 6 x weekly - 2-3 sets - 10 reps Standing Partial Lunge - 2 x daily - 6 x weekly - 2-3 sets - 10 reps Tandem Walking with Counter Support - 2 x daily - 6 x weekly - 3 sets Carioca with Unilateral Counter Support - 2 x daily - 6 x weekly - 1-2 sets - 10 reps Half Tandem Stance Balance with Eyes Closed - 2 x daily - 6 x weekly - 3 reps - 1 sets - 30 sec hold Seated Gaze Stabilization with Head Rotation - 2 x daily - 7 x weekly - 1 sets - 30 seconds Seated Gaze Stabilization with Head Nod - 2 x daily - 7 x weekly - 1 sets - 30 seconds Supine Piriformis Stretch with Foot on Ground - 2 x daily - 7 x weekly - 3 reps - 1 sets - 30 sec hold Lateral Hopping on Level Ground - 1 x daily - 7 x weekly - 5 reps - 1 sets - 15 seconds Lateral Single Leg Lunge Jumps - 1 x daily - 7 x weekly - 3 sets - 10 reps Box Jump - 1 x daily - 7 x weekly - 3 sets - 10 reps Sideways Tape Jumps - 1 x daily - 7 x weekly - 3 sets - 10 reps

## 2020-06-05 ENCOUNTER — Other Ambulatory Visit: Payer: Self-pay

## 2020-06-05 ENCOUNTER — Ambulatory Visit: Payer: Medicare HMO | Admitting: Physical Therapy

## 2020-06-05 ENCOUNTER — Encounter: Payer: Self-pay | Admitting: Physical Therapy

## 2020-06-05 DIAGNOSIS — M6281 Muscle weakness (generalized): Secondary | ICD-10-CM

## 2020-06-05 DIAGNOSIS — R262 Difficulty in walking, not elsewhere classified: Secondary | ICD-10-CM | POA: Diagnosis not present

## 2020-06-05 DIAGNOSIS — R29898 Other symptoms and signs involving the musculoskeletal system: Secondary | ICD-10-CM | POA: Diagnosis not present

## 2020-06-05 DIAGNOSIS — R2681 Unsteadiness on feet: Secondary | ICD-10-CM

## 2020-06-05 NOTE — Therapy (Signed)
[facility] [address] [place], [place], [address] Phone: [phone]   Fax:  [phone]  Physical Therapy Treatment/Discharge Summary  Patient Details  Name: Joshua Hess MRN: [id number] Date of Birth: Jan 30, 1941 Referring Provider (PT): Gregor Hams, MD   Encounter Date: 06/05/2020   PT End of Session - 06/05/20 1506    Visit Number 12    Number of Visits 12    Date for PT Re-Evaluation 06/06/20   extended due to missed weeks   Authorization Type [employer] 12 visits through 11/30    Progress Note Due on Visit 20    PT Start Time 1425    PT Stop Time 1503    PT Time Calculation (min) 38 min    Equipment Utilized During Treatment Gait belt    Activity Tolerance Patient tolerated treatment well    Behavior During Therapy [facility] for tasks assessed/performed           Past Medical History:  Diagnosis Date  . Aphasia   . BPH (benign prostatic hyperplasia)   . Cancer ([place])    behind ear- melomona  . Carotid artery stenosis   . Chronotropic incompetence   . ED (erectile dysfunction)   . Elevated PSA   . HOH (hard of hearing)   . Mild CAD   . Plantar fasciitis   . Postoperative atrial fibrillation ([place])   . Severe aortic stenosis    a. s/p tissue AVR 10/2018.  Marland Kitchen Sinus bradycardia    baseline  . Stroke ([place]) 03/05/2020   TIA  . Wears glasses     Past Surgical History:  Procedure Laterality Date  . AORTIC VALVE REPLACEMENT N/A 11/10/2018   Procedure: AORTIC VALVE REPLACEMENT (AVR), USING 23MM INSPIRIS;  Surgeon: Gaye Pollack, MD;  Location: [place];  Service: Open Heart Surgery;  Laterality: N/A;  . ENDARTERECTOMY Left 03/08/2020   Procedure: ENDARTERECTOMY CAROTID;  Surgeon: Rosetta Posner, MD;  Location: [place];  Service: Vascular;  Laterality: Left;  . EXTERNAL EAR SURGERY     melanoma removed  . HERNIA REPAIR Right    inguinal  . INGUINAL HERNIA REPAIR Right 11/15/2019   Procedure: LAPAROSCOPIC RIGHT INGUINAL HERNIA REPAIR WITH  MESH;  Surgeon: Coralie Keens, MD;  Location: [place];  Service: General;  Laterality: Right;  . RIGHT/LEFT HEART CATH AND CORONARY ANGIOGRAPHY N/A 09/29/2018   Procedure: RIGHT/LEFT HEART CATH AND CORONARY ANGIOGRAPHY;  Surgeon: Burnell Blanks, MD;  Location: [place] CV LAB;  Service: Cardiovascular;  Laterality: N/A;  . TEE WITHOUT CARDIOVERSION N/A 11/10/2018   Procedure: TRANSESOPHAGEAL ECHOCARDIOGRAM (TEE);  Surgeon: Gaye Pollack, MD;  Location: [place];  Service: Open Heart Surgery;  Laterality: N/A;  . TRANSCATHETER AORTIC VALVE REPLACEMENT, TRANSFEMORAL     PROCEDURE ATTEMPTED & ABORTED   . UMBILICAL HERNIA REPAIR N/A 11/15/2019   Procedure: UMBILICAL HERNIA REPAIR;  Surgeon: Coralie Keens, MD;  Location: [place];  Service: General;  Laterality: N/A;    There were no vitals filed for this visit.   Subjective Assessment - 06/05/20 1426    Subjective BP has been a little variable (up/down) going to MD 06/27/20    Patient is accompained by: Family member   Mark   Patient Stated Goals ability to run a marathon              [facility] PT Assessment - 06/05/20 0001      High Level Balance   High Level Balance Comments feet together with EC  on compliant surface: 23 sec      Functional Gait  Assessment   Gait Level Surface Walks 20 ft in less than 5.5 sec, no assistive devices, good speed, no evidence for imbalance, normal gait pattern, deviates no more than 6 in outside of the 12 in walkway width.    Change in Gait Speed Able to smoothly change walking speed without loss of balance or gait deviation. Deviate no more than 6 in outside of the 12 in walkway width.    Gait with Horizontal Head Turns Performs head turns smoothly with no change in gait. Deviates no more than 6 in outside 12 in walkway width    Gait with Vertical Head Turns Performs head turns with no change in gait. Deviates no more than 6 in outside 12 in walkway width.    Gait and Pivot Turn Pivot turns safely  within 3 sec and stops quickly with no loss of balance.    Step Over Obstacle Is able to step over 2 stacked shoe boxes taped together (9 in total height) without changing gait speed. No evidence of imbalance.    Gait with Narrow Base of Support Ambulates 4-7 steps.    Gait with Eyes Closed Walks 20 ft, no assistive devices, good speed, no evidence of imbalance, normal gait pattern, deviates no more than 6 in outside 12 in walkway width. Ambulates 20 ft in less than 7 sec.    Ambulating Backwards Walks 20 ft, uses assistive device, slower speed, mild gait deviations, deviates 6-10 in outside 12 in walkway width.    Steps Alternating feet, must use rail.    Total Score 26                         [facility] Adult PT Treatment/Exercise - 06/05/20 0001      Self-Care   Self-Care Other Self-Care Comments    Other Self-Care Comments  reviewed HEP - condensed to one access code as well as elimiinating exercises that are no longer beneficial.  reviewed each exercise and pt performed a couple reps of gaze and agility exercises for review.  Pt and husband have great understanding                       PT Long Term Goals - 06/05/20 1506      PT LONG TERM GOAL #1   Title independent with HEP    Status Achieved      PT LONG TERM GOAL #2   Title improve FGA to >/= 25/30 for improved balance and mobility    Status Achieved      PT LONG TERM GOAL #3   Title demonstrate improved balance by standing with EC and feet together on compliant surface > 20 sec without LOB    Status Achieved                 Plan - 06/05/20 1506    Clinical Impression Statement Pt has met all goals and is doing very well.  He has demonstrated improvements in balance and functional mobility without pain.  He is running 3-5 miles at this time.  Will d/c PT today.    Personal Factors and Comorbidities Comorbidity 3+    Comorbidities aortic valve replacement, hernia repair with complication of  sepsis, CVA July 2021 s/p carotid endarterectomy    Examination-Activity Limitations Locomotion Level;Stairs    Examination-Participation Restrictions Community Activity;Yard Work;Other   exercise  Stability/Clinical Decision Making Evolving/Moderate complexity    Rehab Potential Good    PT Frequency 2x / week   1-2x/wk   PT Duration 6 weeks    PT Treatment/Interventions ADLs/Self Care Home Management;Cryotherapy;Electrical Stimulation;Moist Heat;Balance training;Therapeutic exercise;Therapeutic activities;Functional mobility training;Stair training;Gait training;Neuromuscular re-education;Patient/family education;Energy conservation    PT Next Visit Plan d/c PT today    PT Home Exercise Plan Access Code: [email or web address]    Consulted and Agree with Plan of Care Patient;Family member/caregiver    Family Member Consulted Partner           Patient will benefit from skilled therapeutic intervention in order to improve the following deficits and impairments:  Abnormal gait, Decreased balance, Decreased mobility, Decreased endurance, Decreased activity tolerance  Visit Diagnosis: Unsteadiness on feet  Difficulty in walking, not elsewhere classified  Muscle weakness (generalized)  Other symptoms and signs involving the musculoskeletal system     Problem List Patient Active Problem List   Diagnosis Date Noted  . History of left-sided carotid endarterectomy 05/27/2020  . Facial droop 03/05/2020  . Mild CAD   . Acute encephalopathy 11/18/2019  . Acute urinary retention 11/18/2019  . Post-operative state 11/18/2019  . Aphasia 08/07/2019  . S/P AVR (aortic valve replacement)   . Carotid artery stenosis   . Chronotropic incompetence   . Severe aortic stenosis   . Sinus bradycardia 08/12/2018  . Benign prostatic hyperplasia with nocturia 11/01/2012      Laureen Abrahams, PT, DPT 06/05/20 3:09 PM    [facility] [address] [place], [place],  [address] Phone: [phone]   Fax:  [phone]  Name: MATHIUS BIRKELAND MRN: [id number] Date of Birth: 1940/07/25     PHYSICAL THERAPY DISCHARGE SUMMARY  Visits from Start of Care: 12  Current functional level related to goals / functional outcomes: See above   Remaining deficits: See above   Education / Equipment: HEP  Plan: Patient agrees to discharge.  Patient goals were met. Patient is being discharged due to meeting the stated rehab goals.  ?????    Laureen Abrahams, PT, DPT 06/05/20 3:09 PM  [facility] [address] [place], [place], [address] Phone: [phone]   Fax:  [phone]

## 2020-06-05 NOTE — Patient Instructions (Signed)
Access Code: [email or web address] URL: [email or web address] Date: 06/05/2020 Prepared by: Faustino Congress  Exercises Squat with Chair Touch - 2 x daily - 6 x weekly - 2-3 sets - 10 reps Tandem Walking with Counter Support - 2 x daily - 6 x weekly - 3 sets Half Tandem Stance Balance with Eyes Closed - 2 x daily - 6 x weekly - 3 reps - 1 sets - 30 sec hold Seated Gaze Stabilization with Head Rotation - 2 x daily - 7 x weekly - 1 sets - 30 seconds Seated Gaze Stabilization with Head Nod - 2 x daily - 7 x weekly - 1 sets - 30 seconds Supine Piriformis Stretch with Foot on Ground - 2 x daily - 7 x weekly - 3 reps - 1 sets - 30 sec hold Lateral Hopping on Level Ground - 1 x daily - 7 x weekly - 5 reps - 1 sets - 15 seconds Lateral Single Leg Lunge Jumps - 1 x daily - 7 x weekly - 3 sets - 10 reps Box Jump - 1 x daily - 7 x weekly - 3 sets - 10 reps Sideways Tape Jumps - 1 x daily - 7 x weekly - 3 sets - 10 reps Heel Toe Raises with Counter Support - 1 x daily - 7 x weekly - 1 sets - 10 reps - 3 sec hold

## 2020-06-17 DIAGNOSIS — L570 Actinic keratosis: Secondary | ICD-10-CM | POA: Diagnosis not present

## 2020-06-17 DIAGNOSIS — L821 Other seborrheic keratosis: Secondary | ICD-10-CM | POA: Diagnosis not present

## 2020-06-17 DIAGNOSIS — L218 Other seborrheic dermatitis: Secondary | ICD-10-CM | POA: Diagnosis not present

## 2020-06-17 DIAGNOSIS — D692 Other nonthrombocytopenic purpura: Secondary | ICD-10-CM | POA: Diagnosis not present

## 2020-06-17 DIAGNOSIS — Z85820 Personal history of malignant melanoma of skin: Secondary | ICD-10-CM | POA: Diagnosis not present

## 2020-06-17 DIAGNOSIS — Z85828 Personal history of other malignant neoplasm of skin: Secondary | ICD-10-CM | POA: Diagnosis not present

## 2020-06-19 NOTE — Progress Notes (Signed)
   I, Wendy Poet, LAT, ATC, am serving as scribe for Dr. Lynne Leader.  Joshua Hess is a 79 y.o. male who presents to [place] [facility] today for f/u of generalized myalgias that were thought to be related to statin use.  He was last seen by Dr. Georgina Snell on 04/25/20 and noted moderate improvement after being switched from his rouvastatin to Pitavastatin.  He was advised to con't PT and has completed 12 PT sessions, being discharged on 06/05/20.  Since his last visit, pt reports improvement, but the pain is nagging. Pt is frustrated because he is beginning to lose the ability to run.  Partner reports pt's sense of balance improved greatly from PT. Pt's partner also notes pt has an upcoming opthalmology appt in Jan and is planning to get 2 sets of glasses, pt has hearing aids coming in Jan. Rx tried: knee brace  He also has noted some pain on the lateral aspect of his left foot at the base of the fifth toe.  He notes a small bunion at there.  He is tried modifying his shoe with helps some.  His main issue is that is not able to run more than 5 miles at a time as he gets fatigued easily.  He would like to train up to being able to run marathons again.  He has not been able to resume marathon running since his heart surgery about 2 years ago.  Pertinent review of systems: No fevers or chills  Relevant historical information: Status post aortic valve replacement   Exam:  BP 128/76 (BP Location: Right Arm, Patient Position: Sitting, Cuff Size: Normal)   Pulse (!) 52   Ht 5\' 8"  (1.727 m)   Wt 179 lb 3.2 oz (81.3 kg)   SpO2 98%   BMI 27.25 kg/m  General: Well Developed, well nourished, and in no acute distress.   MSK: Improved gait Left foot small bunionette Left knee normal-appearing with normal motion   Assessment and Plan: 79 y.o. male with exertional fatigue.  Patient has somewhat subjective exertional fatigue.  He is not able to run more than 5 miles at a time.   This is certainly less than what he had been able to do before he had an aortic valve replacement about 2 years ago.  I think we should take this seriously however.  He is 79 years old and did have a major heart surgery but I think he should be able to exercise more that he is now.  We spent some time discussing retraining and discussed some nutritional strategies that should help him be able to increase his activity.  Discussed that there may be a limit to his ability to exert but I think he has room for improvement for his now.  Additionally discussed bunionette management strategies including modification of footwear.  As for his gait and fall risk this has significantly improved with physical therapy.  I agree also with modification of his glasses to allow him to see the ground when he is running.  This should reduce risk of falls as well.  Recheck in about 2 months.   Discussed warning signs or symptoms. Please see discharge instructions. Patient expresses understanding.   The above documentation has been reviewed and is accurate and complete Lynne Leader, M.D.

## 2020-06-20 ENCOUNTER — Ambulatory Visit (INDEPENDENT_AMBULATORY_CARE_PROVIDER_SITE_OTHER): Payer: Medicare HMO | Admitting: Family Medicine

## 2020-06-20 ENCOUNTER — Other Ambulatory Visit: Payer: Self-pay

## 2020-06-20 VITALS — BP 128/76 | HR 52 | Ht 68.0 in | Wt 179.2 lb

## 2020-06-20 DIAGNOSIS — R5383 Other fatigue: Secondary | ICD-10-CM | POA: Insufficient documentation

## 2020-06-20 DIAGNOSIS — M21622 Bunionette of left foot: Secondary | ICD-10-CM | POA: Diagnosis not present

## 2020-06-20 NOTE — Patient Instructions (Addendum)
Thank you for coming in today.  For the bunionette try modofying your shoe to reduce pressure on the outside of your foot.   For fatigue with exertion try adding a recovery meal. Using that ensure protein drink after the exercise is a great time.   Recheck in 1-2 months.

## 2020-06-27 ENCOUNTER — Ambulatory Visit: Payer: Medicare HMO | Admitting: Cardiovascular Disease

## 2020-06-27 ENCOUNTER — Encounter: Payer: Self-pay | Admitting: Cardiovascular Disease

## 2020-06-27 ENCOUNTER — Other Ambulatory Visit: Payer: Self-pay

## 2020-06-27 VITALS — BP 158/80 | HR 67 | Ht 68.0 in | Wt 180.8 lb

## 2020-06-27 DIAGNOSIS — I2510 Atherosclerotic heart disease of native coronary artery without angina pectoris: Secondary | ICD-10-CM | POA: Diagnosis not present

## 2020-06-27 DIAGNOSIS — I4891 Unspecified atrial fibrillation: Secondary | ICD-10-CM

## 2020-06-27 DIAGNOSIS — I6523 Occlusion and stenosis of bilateral carotid arteries: Secondary | ICD-10-CM

## 2020-06-27 DIAGNOSIS — I9789 Other postprocedural complications and disorders of the circulatory system, not elsewhere classified: Secondary | ICD-10-CM

## 2020-06-27 DIAGNOSIS — I350 Nonrheumatic aortic (valve) stenosis: Secondary | ICD-10-CM | POA: Diagnosis not present

## 2020-06-27 DIAGNOSIS — Z952 Presence of prosthetic heart valve: Secondary | ICD-10-CM

## 2020-06-27 NOTE — Patient Instructions (Signed)

## 2020-06-27 NOTE — Progress Notes (Signed)
Chief Complaint  Patient presents with  . Follow-up    Aortic valve disease   History of Present Illness: 79 yo male with history of severe aortic stenosis s/p AVR, post op atrial fib, mild CAD and carotid artery disease who is here today for cardiac follow up. He underwent surgical AVR 11/10/18. He had been worked up for TAVR but a mobile mass was seen in the aortic sinus so the TAVR was cancelled. 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 He was started on ASA and Metoprolol post op. Due to a brief run of atrial fib post op, amiodarone was used but stopped due to nausea. He continue to struggle with nausea and fatigue post op so metoprolol was stopped. Echo 01/26/19 with LVEF=50-55%. The bioprosthetic AVR is working well with mean gradient 16 mmHg. CVA in August 2021. He underwent left carotid endarterectomy on 03/08/20. Echo 03/08/20 with LVEF=50-55%, mild MR, normally functioning AVR.   He is here today for follow up. The patient denies any chest pain, dyspnea, palpitations, lower extremity edema, orthopnea, PND, dizziness, near syncope or syncope. He has cardiac complaints. Still doing a "walk/run" for 5 miles per day. Has c/o ED issues.    Primary Care Physician: [name], MD  Past Medical History:  Diagnosis Date  . Aphasia   . Cancer ([place])    behind ear- melomona  . Carotid artery stenosis   . Chronotropic incompetence   . ED (erectile dysfunction)   . Elevated PSA   . HOH (hard of hearing)   . Mild CAD   . Plantar fasciitis   . Postoperative atrial fibrillation ([place])   . Severe aortic stenosis    a. s/p tissue AVR 10/2018.  Marland Kitchen Sinus bradycardia    baseline  . Stroke ([place]) 03/05/2020   TIA  . Wears glasses     Past Surgical History:  Procedure Laterality Date  . AORTIC VALVE REPLACEMENT N/A 11/10/2018   Procedure: AORTIC VALVE REPLACEMENT (AVR), USING 23MM INSPIRIS;  Surgeon: Gaye Pollack, MD;  Location: [place];   Service: Open Heart Surgery;  Laterality: N/A;  . ENDARTERECTOMY Left 03/08/2020   Procedure: ENDARTERECTOMY CAROTID;  Surgeon: Rosetta Posner, MD;  Location: [place];  Service: Vascular;  Laterality: Left;  . EXTERNAL EAR SURGERY     melanoma removed  . HERNIA REPAIR Right    inguinal  . INGUINAL HERNIA REPAIR Right 11/15/2019   Procedure: LAPAROSCOPIC RIGHT INGUINAL HERNIA REPAIR WITH MESH;  Surgeon: Coralie Keens, MD;  Location: [place];  Service: General;  Laterality: Right;  . RIGHT/LEFT HEART CATH AND CORONARY ANGIOGRAPHY N/A 09/29/2018   Procedure: RIGHT/LEFT HEART CATH AND CORONARY ANGIOGRAPHY;  Surgeon: Burnell Blanks, MD;  Location: [place] CV LAB;  Service: Cardiovascular;  Laterality: N/A;  . TEE WITHOUT CARDIOVERSION N/A 11/10/2018   Procedure: TRANSESOPHAGEAL ECHOCARDIOGRAM (TEE);  Surgeon: Gaye Pollack, MD;  Location: [place];  Service: Open Heart Surgery;  Laterality: N/A;  . TRANSCATHETER AORTIC VALVE REPLACEMENT, TRANSFEMORAL     PROCEDURE ATTEMPTED & ABORTED   . UMBILICAL HERNIA REPAIR N/A 11/15/2019   Procedure: UMBILICAL HERNIA REPAIR;  Surgeon: Coralie Keens, MD;  Location: [place];  Service: General;  Laterality: N/A;    Current Outpatient Medications  Medication Sig Dispense Refill  . ALPRAZolam (XANAX) 0.25 MG tablet Take 0.25 mg by mouth at bedtime as needed for anxiety.    Marland Kitchen amoxicillin (AMOXIL) 500 MG capsule TAKE 4  CAPSULES BY MOUTH ONE TO TWO HOURS PRIOR TO DENTAL APPOINTMENT 16 capsule 1  . Apoaequorin 10 MG CAPS Take 10 mg by mouth daily.     Marland Kitchen aspirin EC 81 MG EC tablet Take 1 tablet (81 mg total) by mouth daily. Swallow whole. 21 tablet 0  . finasteride (PROSCAR) 5 MG tablet Take 1 tablet (5 mg total) by mouth daily. 30 tablet 0  . Pitavastatin Magnesium (ZYPITAMAG) 4 MG TABS Take 4 mg by mouth daily. 90 tablet 1  . polyethylene glycol (MIRALAX / GLYCOLAX) 17 g packet Take 17 g by mouth daily. (Patient taking differently: Take 17 g by mouth daily  as needed for mild constipation.) 14 each 0  . sertraline (ZOLOFT) 100 MG tablet Take 50 mg by mouth daily.     . sildenafil (REVATIO) 20 MG tablet Take 20 mg by mouth daily as needed (ED).    . traZODone (DESYREL) 100 MG tablet Take 50-100 mg by mouth at bedtime.     No current facility-administered medications for this visit.    Allergies  Allergen Reactions  . Flomax [Tamsulosin] Other (See Comments)    Made feel foggy headed    Social History   Socioeconomic History  . Marital status: Married    Spouse name: Not on file  . Number of children: 0  . Years of education: college  . Highest education level: Bachelor's degree (e.g., BA, AB, BS)  Occupational History  . Occupation: Retired-Human Resources  Tobacco Use  . Smoking status: Former Smoker    Years: 1.00    Quit date: 1970    Years since quitting: 51.9  . Smokeless tobacco: Never Used  Vaping Use  . Vaping Use: Never used  Substance and Sexual Activity  . Alcohol use: No  . Drug use: No  . Sexual activity: Not on file  Other Topics Concern  . Not on file  Social History Narrative   Caffeine: 2 cups daily   Right-handed.   Lives with legal spouse, [name].    Social Determinants of Health   Financial Resource Strain: Not on file  Food Insecurity: Not on file  Transportation Needs: Not on file  Physical Activity: Not on file  Stress: Not on file  Social Connections: Not on file  Intimate Partner Violence: Not on file    Family History  Problem Relation Age of Onset  . Heart attack Mother   . Other Father        unsure of history  . Heart disease Brother   . Dementia Sister     Review of Systems:  As stated in the HPI and otherwise negative.   BP (!) 158/80   Pulse 67   Ht 5\' 8"  (1.727 m)   Wt 180 lb 12.8 oz (82 kg)   SpO2 97%   BMI 27.49 kg/m   Physical Examination:   General: Well developed, well nourished, NAD  HEENT: OP clear, mucus membranes moist  SKIN: warm, dry. No rashes. Neuro:  No focal deficits  Musculoskeletal: Muscle strength 5/5 all ext  Psychiatric: Mood and affect normal  Neck: No JVD, no carotid bruits, no thyromegaly, no lymphadenopathy.  Lungs:Clear bilaterally, no wheezes, rhonci, crackles Cardiovascular: Regular rate and rhythm. No murmurs, gallops or rubs. Abdomen:Soft. Bowel sounds present. Non-tender.  Extremities: No lower extremity edema. Pulses are 2 + in the bilateral DP/PT.  Echo August 2021:   EKG:  EKG is ordered today. The ekg ordered today demonstrates   Recent Labs:  03/06/2020: TSH 3.435 03/09/2020: ALT 14; Magnesium 1.9 04/29/2020: BUN 24; Creatinine, Ser 1.01; Hemoglobin 14.9; Platelets 198; Potassium 3.4; Sodium 139   Lipid Panel    Component Value Date/Time   CHOL 109 03/06/2020 0158   TRIG 49 03/06/2020 0158   HDL 47 03/06/2020 0158   CHOLHDL 2.3 03/06/2020 0158   VLDL 10 03/06/2020 0158   LDLCALC 52 03/06/2020 0158     Wt Readings from Last 3 Encounters:  06/27/20 180 lb 12.8 oz (82 kg)  06/20/20 179 lb 3.2 oz (81.3 kg)  05/27/20 176 lb 8 oz (80.1 kg)     Assessment and Plan:   1. Aortic stenosis: He is now s/p surgical AVR on 11/10/18 and doing well. The bioprosthetic AVR is working well by echo August 2021. Continue ASA and SBE prophylaxis as need prior to dental procedures and other indicated surgeries.   2. CAD without angina: Mild CAD on cath pre AVR in 2020. He did not tolerate beta blockers due to fatigue. No chest pain. Continue ASA and statin  3. Post op atrial fibrillation: No recurrence since then. No indication for anticoagulation.   4. Carotid artery disease: CVA in August 2021 and now s/p left CEA. Followed in [facility]. .   Current medicines are reviewed at length with the patient today.  The patient does not have concerns regarding medicines.  The following changes have been made:  no change  Labs/ tests ordered today include:   No orders of the defined types were placed in this  encounter.    Disposition:   FU with me in 12 months.    Signed, Lauree Chandler, MD 06/27/2020 12:05 PM    [facility] [place], [place], [place]  [address] Phone: [phone]; Fax: [phone]

## 2020-07-23 DIAGNOSIS — H903 Sensorineural hearing loss, bilateral: Secondary | ICD-10-CM | POA: Diagnosis not present

## 2020-08-03 IMAGING — DX PORTABLE CHEST - 1 VIEW
1 series · 1 of 1 positions shown · non-contrast
Comparison: Two-view chest x-ray 11/04/2015

CLINICAL DATA: Right-sided chest tightness.

EXAM:
PORTABLE CHEST 1 VIEW

[chest]
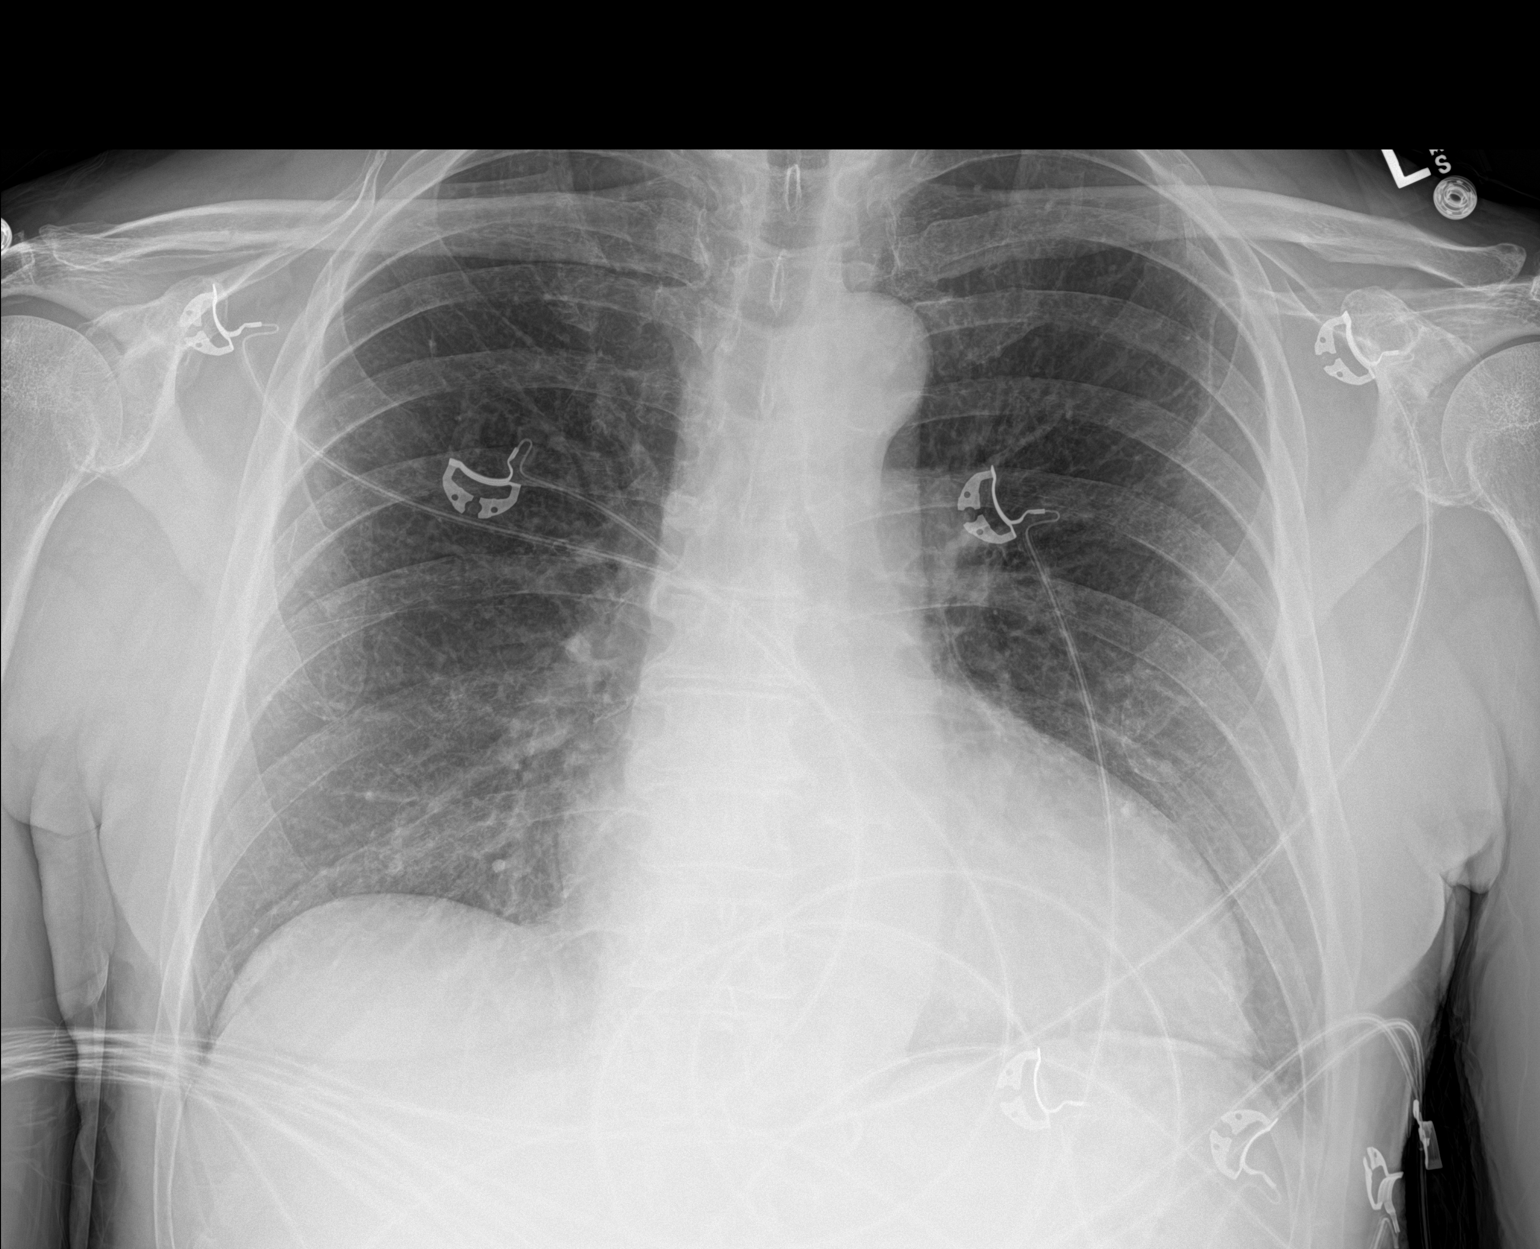

[1 of 1 positions shown; findings below may reference images not displayed]

FINDINGS: Heart is mildly enlarged. There is some prominence of the
interstitium, unchanged. There is no edema or effusion to suggest
failure. No focal airspace disease is present. Visualized soft
tissues and bony thorax are unremarkable.
IMPRESSION: 1. Cardiomegaly without failure.
2. No acute cardiopulmonary disease.

## 2020-08-06 DIAGNOSIS — H2513 Age-related nuclear cataract, bilateral: Secondary | ICD-10-CM | POA: Diagnosis not present

## 2020-08-06 DIAGNOSIS — H43813 Vitreous degeneration, bilateral: Secondary | ICD-10-CM | POA: Diagnosis not present

## 2020-08-06 DIAGNOSIS — H5203 Hypermetropia, bilateral: Secondary | ICD-10-CM | POA: Diagnosis not present

## 2020-08-06 DIAGNOSIS — H0100A Unspecified blepharitis right eye, upper and lower eyelids: Secondary | ICD-10-CM | POA: Diagnosis not present

## 2020-08-14 DIAGNOSIS — N3941 Urge incontinence: Secondary | ICD-10-CM | POA: Diagnosis not present

## 2020-08-14 DIAGNOSIS — N5201 Erectile dysfunction due to arterial insufficiency: Secondary | ICD-10-CM | POA: Diagnosis not present

## 2020-08-14 DIAGNOSIS — N3943 Post-void dribbling: Secondary | ICD-10-CM | POA: Diagnosis not present

## 2020-08-14 DIAGNOSIS — R972 Elevated prostate specific antigen [PSA]: Secondary | ICD-10-CM | POA: Diagnosis not present

## 2020-08-14 DIAGNOSIS — N401 Enlarged prostate with lower urinary tract symptoms: Secondary | ICD-10-CM | POA: Diagnosis not present

## 2020-09-02 ENCOUNTER — Ambulatory Visit: Payer: Medicare HMO | Admitting: Family Medicine

## 2020-09-16 DIAGNOSIS — H903 Sensorineural hearing loss, bilateral: Secondary | ICD-10-CM | POA: Diagnosis not present

## 2020-09-20 NOTE — Progress Notes (Signed)
I, Wendy Poet, LAT, ATC, am serving as scribe for Dr. Lynne Leader.  Joshua Hess is a 80 y.o. male who presents to [facility] today for f/u of exertional fatigue and generalized myalgias that were thought to be related to statin use w/ noted improvement after switching from rouvastatin to Pitavastatin.  He was last seen by Dr. Georgina Snell on 06/20/20 and noted improvement but con't to have nagging pain. He was frustrated by his decreased ability to run.  He also noted some L lateral foot pain at the base of his 5th toe. He was advised in how to modify his footwear to decrease pressure to the outside of his L foot and was advised in some nutritional strategies to help w/ exertional fatigue. He has completed 12 PT sessions and has been d/c from PT.  Since his last visit w/ Dr. Georgina Snell, pt reports he has not been feeling well. Pt c/o feeling shaky in his hands, even though he's not actually shaking. Pt's partner reports dc Zoloft to treat anxiety which seemed to help the shaky feeling.  He does take Xanax daily.  He notes continued dizziness and unbalanced.  Pt also c/o dizziness that results in nausea and headache.  Pt has a bunionette on L foot and was wondering about possible referral for treatment.  He has tried modification of his footwear which helps some.  He would like to avoid surgery if possible.   Pertinent review of systems: No fevers or chills  Relevant historical information: History of stroke   Exam:  BP (!) 145/78 (BP Location: Right Arm, Patient Position: Sitting, Cuff Size: Normal)   Pulse (!) 55   Ht 5\' 8"  (1.727 m)   Wt 186 lb 3.2 oz (84.5 kg)   SpO2 99%   BMI 28.31 kg/m  General: Well Developed, well nourished, and in no acute distress.   MSK: Left foot small bunionette left foot.  Gapping of toes indicating loss of transverse arch.  Some claw toe deformity present.  Gait: Slight unstable     Assessment and Plan: 80 y.o. male with  bunionette: Modification of footwear.  Referral to podiatry for second opinion regarding other conservative management.  Patient would like to avoid surgery although it may be his best option at this point.  Dizziness and balance issue.  Patient had conventional physical therapy and had great improvement.  Refer to vestibular physical therapy for dedicated specialized care for this.  He takes Xanax which may be a contributory factor here.  Recommend discuss with PCP DC Xanax and getting back on Zoloft or another SSRI for anxiety.  Recheck back as needed.   PDMP not reviewed this encounter. Orders Placed This Encounter  Procedures  . Ambulatory referral to Physical Therapy    Referral Priority:   Routine    Referral Type:   Physical Medicine    Referral Reason:   Specialty Services Required    Requested Specialty:   Physical Therapy  . Ambulatory referral to Podiatry    Referral Priority:   Routine    Referral Type:   Consultation    Referral Reason:   Specialty Services Required    Requested Specialty:   Podiatry    Number of Visits Requested:   1   No orders of the defined types were placed in this encounter.    Discussed warning signs or symptoms. Please see discharge instructions. Patient expresses understanding.   The above documentation has been reviewed and  is accurate and complete Lynne Leader, M.D.

## 2020-09-23 ENCOUNTER — Other Ambulatory Visit: Payer: Self-pay

## 2020-09-23 ENCOUNTER — Ambulatory Visit (INDEPENDENT_AMBULATORY_CARE_PROVIDER_SITE_OTHER): Payer: Medicare HMO | Admitting: Family Medicine

## 2020-09-23 VITALS — BP 145/78 | HR 55 | Ht 68.0 in | Wt 186.2 lb

## 2020-09-23 DIAGNOSIS — Z7409 Other reduced mobility: Secondary | ICD-10-CM

## 2020-09-23 DIAGNOSIS — M21622 Bunionette of left foot: Secondary | ICD-10-CM

## 2020-09-23 NOTE — Patient Instructions (Addendum)
Thank you for coming in today.  Falls and balance:  1) I think Xanax may be a problem. I would recommend following up with Dr Shelia Media and restarting Zoloft and stopping Xanax.   2) I am referring for [facility].    [place]: Work on footwear.  Refer to podiatry.

## 2020-09-25 DIAGNOSIS — C44722 Squamous cell carcinoma of skin of right lower limb, including hip: Secondary | ICD-10-CM | POA: Diagnosis not present

## 2020-09-25 DIAGNOSIS — D692 Other nonthrombocytopenic purpura: Secondary | ICD-10-CM | POA: Diagnosis not present

## 2020-09-25 DIAGNOSIS — L570 Actinic keratosis: Secondary | ICD-10-CM | POA: Diagnosis not present

## 2020-09-25 DIAGNOSIS — Z85828 Personal history of other malignant neoplasm of skin: Secondary | ICD-10-CM | POA: Diagnosis not present

## 2020-09-25 DIAGNOSIS — L814 Other melanin hyperpigmentation: Secondary | ICD-10-CM | POA: Diagnosis not present

## 2020-09-25 DIAGNOSIS — C44619 Basal cell carcinoma of skin of left upper limb, including shoulder: Secondary | ICD-10-CM | POA: Diagnosis not present

## 2020-09-25 DIAGNOSIS — L821 Other seborrheic keratosis: Secondary | ICD-10-CM | POA: Diagnosis not present

## 2020-09-25 DIAGNOSIS — Z85820 Personal history of malignant melanoma of skin: Secondary | ICD-10-CM | POA: Diagnosis not present

## 2020-09-25 NOTE — Telephone Encounter (Signed)
I called back and spoke to [employer] (on [facility]). States the patient has a pending appt w/ Dr. Shelia Media (PCP) on 10/03/20. They are going to have him further evaluate the headaches to see what he recommends. They are aware we are happy to see him in the office, if a neurological consult is needed.

## 2020-09-26 ENCOUNTER — Other Ambulatory Visit: Payer: Self-pay

## 2020-09-26 ENCOUNTER — Telehealth: Payer: Self-pay

## 2020-09-26 DIAGNOSIS — I6523 Occlusion and stenosis of bilateral carotid arteries: Secondary | ICD-10-CM

## 2020-09-26 NOTE — Telephone Encounter (Signed)
Patient's spouse sent mychart message about patient having unilateral intermittent headaches.Moved up patient surveillance appt for carotid stenosis. Discussed s/s of stroke and advised to call 911 if any of these occur. Verbalizes understanding.

## 2020-09-28 ENCOUNTER — Other Ambulatory Visit: Payer: Self-pay | Admitting: Family Medicine

## 2020-09-30 ENCOUNTER — Other Ambulatory Visit: Payer: Self-pay

## 2020-09-30 ENCOUNTER — Ambulatory Visit: Payer: Medicare HMO | Admitting: Podiatry

## 2020-09-30 ENCOUNTER — Ambulatory Visit (INDEPENDENT_AMBULATORY_CARE_PROVIDER_SITE_OTHER): Payer: Medicare HMO

## 2020-09-30 ENCOUNTER — Encounter: Payer: Self-pay | Admitting: Podiatry

## 2020-09-30 DIAGNOSIS — M21619 Bunion of unspecified foot: Secondary | ICD-10-CM

## 2020-09-30 DIAGNOSIS — M2042 Other hammer toe(s) (acquired), left foot: Secondary | ICD-10-CM

## 2020-09-30 DIAGNOSIS — M779 Enthesopathy, unspecified: Secondary | ICD-10-CM

## 2020-09-30 DIAGNOSIS — M21612 Bunion of left foot: Secondary | ICD-10-CM

## 2020-09-30 DIAGNOSIS — M21611 Bunion of right foot: Secondary | ICD-10-CM | POA: Diagnosis not present

## 2020-09-30 DIAGNOSIS — M21622 Bunionette of left foot: Secondary | ICD-10-CM | POA: Diagnosis not present

## 2020-09-30 MED ORDER — TRIAMCINOLONE ACETONIDE 10 MG/ML IJ SUSP
10.0000 mg | Freq: Once | INTRAMUSCULAR | Status: AC
  Administered 2020-09-30: 10 mg

## 2020-09-30 NOTE — Progress Notes (Signed)
Subjective:   Patient ID: Joshua Hess, male   DOB: 80 y.o.   MRN: [id number]   HPI Patient presents stating he has had a painful bunion on the outside of his left foot and in his joint left and is a very active person and is concerned also about hammertoe deformity.  Patient states it does make it hard for him to walk and be active and patient does not smoke and does like to be active and is no longer running marathons   Review of Systems  All other systems reviewed and are negative.       Objective:  Physical Exam Vitals and nursing note reviewed.  Constitutional:      Appearance: He is well-developed.  Pulmonary:     Effort: Pulmonary effort is normal.  Musculoskeletal:        General: Normal range of motion.  Skin:    General: Skin is warm.  Neurological:     Mental Status: He is alert.     Neurovascular status intact muscle strength adequate range of motion adequate with prominent fifth metatarsal head left with inflammation fluid around the joint and pain around the third metatarsal phalangeal joint left localized.  Patient has medial deviation second and third digits left foot     Assessment:  Probability for inflammatory capsulitis around fifth metatarsal head left along with inflammatory capsulitis third MPJ and digital deformities left over right     Plan:  H&P reviewed both conditions and at this time I did sterile prep and injected around the fifth MPJ left 3 mg Dexasone Kenalog 5 mg Xylocaine and I did a periarticular injection of the left third MPJ after sterile prep 3 mg Dexasone Kenalog 5 mg Xylocaine discussed digital deformities do not recommend surgical intervention  X-rays indicate that there is medial deviation second and third toe left over right localized no indications of arthritis with prominent fifth metatarsal head left over right

## 2020-09-30 NOTE — Telephone Encounter (Signed)
Please advise 

## 2020-10-08 ENCOUNTER — Ambulatory Visit: Payer: Medicare HMO | Attending: Family Medicine

## 2020-10-08 ENCOUNTER — Other Ambulatory Visit: Payer: Self-pay

## 2020-10-08 DIAGNOSIS — M6281 Muscle weakness (generalized): Secondary | ICD-10-CM

## 2020-10-08 DIAGNOSIS — R2681 Unsteadiness on feet: Secondary | ICD-10-CM | POA: Insufficient documentation

## 2020-10-09 NOTE — Therapy (Signed)
[facility] [address] [place], [place], [address] Phone: [phone]   Fax:  [phone]  Physical Therapy Evaluation  Patient Details  Name: Joshua Hess MRN: [id number] Date of Birth: 27-Jul-1940 Referring Provider (PT): Dr. Lynne Leader   Encounter Date: 10/08/2020   PT End of Session - 10/09/20 1348    Visit Number 1    Number of Visits 1    Date for PT Re-Evaluation 12/04/20    Authorization Type [facility] ACO    PT Start Time 0354    PT Stop Time 6568    PT Time Calculation (min) 45 min    Activity Tolerance Patient tolerated treatment well;Other (comment)   session affected by anxiety   Behavior During Therapy Restless;Anxious           Past Medical History:  Diagnosis Date  . Aphasia   . 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 Carotid artery stenosis   . Chronotropic incompetence   . ED (erectile dysfunction)   . Elevated PSA   . HOH (hard of hearing)   . Mild CAD   . Plantar fasciitis   . Postoperative atrial fibrillation ([place])   . Severe aortic stenosis    a. s/p tissue AVR 10/2018.  Marland Kitchen Sinus bradycardia    baseline  . Stroke ([place]) 03/05/2020   TIA  . Wears glasses     Past Surgical History:  Procedure Laterality Date  . AORTIC VALVE REPLACEMENT N/A 11/10/2018   Procedure: AORTIC VALVE REPLACEMENT (AVR), USING 23MM INSPIRIS;  Surgeon: Gaye Pollack, MD;  Location: [facility];  Service: Open Heart Surgery;  Laterality: N/A;  . ENDARTERECTOMY Left 03/08/2020   Procedure: ENDARTERECTOMY CAROTID;  Surgeon: Rosetta Posner, MD;  Location: [facility];  Service: Vascular;  Laterality: Left;  . EXTERNAL EAR SURGERY     melanoma removed  . HERNIA REPAIR Right    inguinal  . INGUINAL HERNIA REPAIR Right 11/15/2019   Procedure: LAPAROSCOPIC RIGHT INGUINAL HERNIA REPAIR WITH MESH;  Surgeon: Coralie Keens, MD;  Location: [facility];  Service: General;  Laterality: Right;  . RIGHT/LEFT HEART CATH AND CORONARY ANGIOGRAPHY  N/A 09/29/2018   Procedure: RIGHT/LEFT HEART CATH AND CORONARY ANGIOGRAPHY;  Surgeon: Burnell Blanks, MD;  Location: [facility];  Service: Cardiovascular;  Laterality: N/A;  . TEE WITHOUT CARDIOVERSION N/A 11/10/2018   Procedure: TRANSESOPHAGEAL ECHOCARDIOGRAM (TEE);  Surgeon: Gaye Pollack, MD;  Location: [facility];  Service: Open Heart Surgery;  Laterality: N/A;  . TRANSCATHETER AORTIC VALVE REPLACEMENT, TRANSFEMORAL     PROCEDURE ATTEMPTED & ABORTED   . UMBILICAL HERNIA REPAIR N/A 11/15/2019   Procedure: UMBILICAL HERNIA REPAIR;  Surgeon: Coralie Keens, MD;  Location: [facility];  Service: General;  Laterality: N/A;    There were no vitals filed for this visit.    Subjective Assessment - 10/09/20 1341    Subjective patient presents as very apprehensive towards session, not knowing what to expect or reason he is here, accompanied by husband who served as calming influence and assisted with history taken as patient was forgetful or in denial.  Main concern was degradation of gait and balnce over time due to intrinsic factors of anxiety, "heart pounding" and nausea with certain gaze tasks    Patient is accompained by: Family member   husband   Limitations Walking    How long can you sit comfortably? unlimited    How long can you stand comfortably? <15 min  How long can you walk comfortably? <15 min    Patient Stated Goals To walk in a straight line again    Currently in Pain? No/denies              [facility] PT Assessment - 10/09/20 0001      Assessment   Medical Diagnosis vertigo    Referring Provider (PT) Dr. Lynne Leader             10/09/20 0001  Assessment  Medical Diagnosis vertigo  Referring Provider (PT) Dr. Lynne Leader  Onset Date/Surgical Date 09/23/20  Next MD Visit PRN  Prior Therapy OPPT  Precautions  Precautions Fall  Balance Screen  Has the patient fallen in the past 6 months Yes  How many times? 4  Has the patient had a decrease in activity level  because of a fear of falling?  Yes  Is the patient reluctant to leave their home because of a fear of falling?  Yes  [place] Private residence  Living Arrangements Spouse/significant other  Available Help at Discharge Family  Type of [place] to enter  Entrance Stairs-Number of Steps 2  [place] Two level  Prior Function  Level of Independence Independent  Cognition  Overall Cognitive Status History of cognitive impairments - at baseline  Attention Focused  Sensation  Light Touch Appears Intact  Coordination  Gross Motor Movements are Fluid and Coordinated Yes  ROM / Strength  AROM / PROM / Strength Strength  Strength  Overall Strength Within functional limits for tasks performed  Functional Gait  Assessment  Gait assessed  Yes  Gait Level Surface 1  Change in Gait Speed 1  Gait with Horizontal Head Turns 1  Gait with Vertical Head Turns 1  Gait and Pivot Turn 1  Step Over Obstacle 1  Gait with Narrow Base of Support 0  Gait with Eyes Closed 0  Ambulating Backwards 1  Steps 2  Total Score 9  FGA comment: performance limited by anxiety vs. physical restrictions              Objective measurements completed on examination: See above findings.               PT Education - 10/09/20 1345    Education Details patient and spouse eductaed on eval findings, prognosis and POC including vestibular assessment next sesion with trained vertigo clinician    Person(s) Educated Patient;Spouse    Methods Explanation    Comprehension Verbalized understanding            PT Short Term Goals - 10/09/20 1356      PT SHORT TERM GOAL #1   Title Undergo vestibular assessment and set goals at that ime    Baseline nt    Time 1    Period Weeks    Status New    Target Date 10/16/20                     Plan - 10/09/20 1349    Clinical Impression Statement patient arrived for session with spouse,  very anxious and questioning need for todays visit, spouse very supportive and understanding of need for session and was able to assit with history  and as a calming influence on patient, treatment options limitd due to anxiety but HR and O2 sats WNL.  Limitd objective testing sugestive of central vestibular disturbance as evidenced by HA and nausea c/o with visual  tracking tasks.  Recommeded patient return for assessment of suspected vestibular dysfunction by a trained clinician    Personal Factors and Comorbidities Comorbidity 2    Comorbidities CVA, anxiety    Examination-Activity Limitations Locomotion Level;Transfers;Sleep    Examination-Participation Restrictions Community Activity    Stability/Clinical Decision Making Evolving/Moderate complexity    Clinical Decision Making Moderate    Rehab Potential Good    PT Frequency 2x / week    PT Duration 6 weeks    PT Treatment/Interventions ADLs/Self Care Home Management;Aquatic Therapy;Canalith Repostioning;Gait training;Stair training;Functional mobility training;Therapeutic activities;Therapeutic exercise;Balance training;Neuromuscular re-education;Patient/family education;Visual/perceptual remediation/compensation;Vestibular    PT Next Visit Plan Have patient assess for vestibular dysfunction    PT Home Exercise Plan none at this time    Consulted and Agree with Plan of Care Patient;Family member/caregiver    Family Member Consulted spouse           Patient will benefit from skilled therapeutic intervention in order to improve the following deficits and impairments:  Abnormal gait,Difficulty walking,Decreased endurance,Decreased balance,Decreased mobility,Decreased strength  Visit Diagnosis: Unsteadiness on feet  Muscle weakness (generalized)     Problem List Patient Active Problem List   Diagnosis Date Noted  . Bunionette of left foot 06/20/2020  . Fatigue 06/20/2020  . History of left-sided carotid endarterectomy 05/27/2020   . Facial droop 03/05/2020  . Mild CAD   . Acute encephalopathy 11/18/2019  . Acute urinary retention 11/18/2019  . Post-operative state 11/18/2019  . Aphasia 08/07/2019  . S/P AVR (aortic valve replacement)   . Carotid artery stenosis   . Chronotropic incompetence   . Severe aortic stenosis   . Sinus bradycardia 08/12/2018  . Benign prostatic hyperplasia with nocturia 11/01/2012    Lanice Shirts PT 10/09/2020, 2:30 PM  [facility] [address] [place], [place], [address] Phone: [phone]   Fax:  [phone]  Name: Joshua Hess MRN: [id number] Date of Birth: [birth year]

## 2020-10-10 DIAGNOSIS — F5104 Psychophysiologic insomnia: Secondary | ICD-10-CM | POA: Diagnosis not present

## 2020-10-10 DIAGNOSIS — I6523 Occlusion and stenosis of bilateral carotid arteries: Secondary | ICD-10-CM | POA: Diagnosis not present

## 2020-10-10 DIAGNOSIS — E7800 Pure hypercholesterolemia, unspecified: Secondary | ICD-10-CM | POA: Diagnosis not present

## 2020-10-13 ENCOUNTER — Encounter: Payer: Self-pay | Admitting: Family Medicine

## 2020-10-13 DIAGNOSIS — Z7409 Other reduced mobility: Secondary | ICD-10-CM

## 2020-10-16 DIAGNOSIS — R531 Weakness: Secondary | ICD-10-CM | POA: Diagnosis not present

## 2020-10-16 DIAGNOSIS — R2689 Other abnormalities of gait and mobility: Secondary | ICD-10-CM | POA: Diagnosis not present

## 2020-10-16 DIAGNOSIS — R2681 Unsteadiness on feet: Secondary | ICD-10-CM | POA: Diagnosis not present

## 2020-10-16 DIAGNOSIS — R42 Dizziness and giddiness: Secondary | ICD-10-CM | POA: Diagnosis not present

## 2020-10-17 DIAGNOSIS — R2689 Other abnormalities of gait and mobility: Secondary | ICD-10-CM | POA: Diagnosis not present

## 2020-10-17 DIAGNOSIS — R531 Weakness: Secondary | ICD-10-CM | POA: Diagnosis not present

## 2020-10-17 DIAGNOSIS — R42 Dizziness and giddiness: Secondary | ICD-10-CM | POA: Diagnosis not present

## 2020-10-17 DIAGNOSIS — R2681 Unsteadiness on feet: Secondary | ICD-10-CM | POA: Diagnosis not present

## 2020-10-18 ENCOUNTER — Ambulatory Visit: Payer: Medicare HMO

## 2020-10-18 ENCOUNTER — Telehealth: Payer: Self-pay | Admitting: Cardiovascular Disease

## 2020-10-18 NOTE — Telephone Encounter (Signed)
Will send this message as an FYI, to Dr. Sallyanne Kuster and his primary covering RN.

## 2020-10-18 NOTE — Telephone Encounter (Signed)
Perfectly fine

## 2020-10-18 NOTE — Telephone Encounter (Signed)
    FYI - pt said he only wants to f/u with Dr. Angelena Form and Dr. Donnetta Hutching. No need to see Dr. Sallyanne Kuster

## 2020-10-21 DIAGNOSIS — R531 Weakness: Secondary | ICD-10-CM | POA: Diagnosis not present

## 2020-10-21 DIAGNOSIS — R2681 Unsteadiness on feet: Secondary | ICD-10-CM | POA: Diagnosis not present

## 2020-10-21 DIAGNOSIS — R42 Dizziness and giddiness: Secondary | ICD-10-CM | POA: Diagnosis not present

## 2020-10-21 DIAGNOSIS — R2689 Other abnormalities of gait and mobility: Secondary | ICD-10-CM | POA: Diagnosis not present

## 2020-10-23 ENCOUNTER — Other Ambulatory Visit: Payer: Self-pay

## 2020-10-23 ENCOUNTER — Ambulatory Visit: Payer: Medicare HMO | Admitting: Physician Assistant

## 2020-10-23 ENCOUNTER — Ambulatory Visit (HOSPITAL_COMMUNITY)
Admission: RE | Admit: 2020-10-23 | Discharge: 2020-10-23 | Disposition: A | Discharge status: Home / Self Care | Payer: Medicare HMO | Source: Ambulatory Visit | Attending: Vascular Surgery | Admitting: Vascular Surgery

## 2020-10-23 VITALS — BP 117/71 | HR 55 | Temp 98.5°F | Resp 20 | Ht 68.0 in | Wt 181.4 lb

## 2020-10-23 DIAGNOSIS — R2689 Other abnormalities of gait and mobility: Secondary | ICD-10-CM | POA: Diagnosis not present

## 2020-10-23 DIAGNOSIS — I6523 Occlusion and stenosis of bilateral carotid arteries: Secondary | ICD-10-CM

## 2020-10-23 DIAGNOSIS — R42 Dizziness and giddiness: Secondary | ICD-10-CM | POA: Diagnosis not present

## 2020-10-23 DIAGNOSIS — R531 Weakness: Secondary | ICD-10-CM | POA: Diagnosis not present

## 2020-10-23 DIAGNOSIS — R2681 Unsteadiness on feet: Secondary | ICD-10-CM | POA: Diagnosis not present

## 2020-10-23 NOTE — Telephone Encounter (Signed)
I returned the patient's partner. He was last seen 05/2020. He has been scheduled on 11/11/20 for further physical assessment by Dr. Krista Blue.

## 2020-10-23 NOTE — Progress Notes (Signed)
HISTORY AND PHYSICAL     CC:  follow up. Requesting Provider:  [name], MD  HPI: This is a 80 y.o. male here for follow up for carotid artery stenosis.  Pt is s/p left CEA for symptomatic carotid artery stenosis in August 2021 by Dr. Donnetta Hutching.    Pt has been having left sided headaches after surgery but these have resolved.    He is here with his husband and he tells me he has had some pounding in his chest that happens when he feels anxious.  It varies in how long it lasts.  He states he got an EKG a couple of months ago with his check up and it was normal.  He denies any hx of afib.  He states he eats healthy with fruits and vegetables and some fish.  He does have hx of AVR in 2020 by Dr. Cyndia Bent.    Pt denies any amaurosis fugax, speech difficulties, weakness, numbness, paralysis or clumsiness.  His husband states he continues to have a slight facial droop but this is not new.    The pt is on a statin for cholesterol management.  The pt is on a daily aspirin.   Other AC:  none The pt is not on medication for hypertension.   The pt is not diabetic.   Tobacco hx:  Former-quit 1970  Pt does not have family hx of AAA.  Past Medical History:  Diagnosis Date  . Aphasia   . 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 Carotid artery stenosis   . Chronotropic incompetence   . ED (erectile dysfunction)   . Elevated PSA   . HOH (hard of hearing)   . Mild CAD   . Plantar fasciitis   . Postoperative atrial fibrillation ([place])   . Severe aortic stenosis    a. s/p tissue AVR 10/2018.  Marland Kitchen Sinus bradycardia    baseline  . Stroke ([place]) 03/05/2020   TIA  . Wears glasses     Past Surgical History:  Procedure Laterality Date  . AORTIC VALVE REPLACEMENT N/A 11/10/2018   Procedure: AORTIC VALVE REPLACEMENT (AVR), USING 23MM INSPIRIS;  Surgeon: Gaye Pollack, MD;  Location: [facility];  Service: Open Heart Surgery;  Laterality: N/A;  . ENDARTERECTOMY Left 03/08/2020   Procedure: ENDARTERECTOMY  CAROTID;  Surgeon: Rosetta Posner, MD;  Location: [facility];  Service: Vascular;  Laterality: Left;  . EXTERNAL EAR SURGERY     melanoma removed  . HERNIA REPAIR Right    inguinal  . INGUINAL HERNIA REPAIR Right 11/15/2019   Procedure: LAPAROSCOPIC RIGHT INGUINAL HERNIA REPAIR WITH MESH;  Surgeon: Coralie Keens, MD;  Location: [facility];  Service: General;  Laterality: Right;  . RIGHT/LEFT HEART CATH AND CORONARY ANGIOGRAPHY N/A 09/29/2018   Procedure: RIGHT/LEFT HEART CATH AND CORONARY ANGIOGRAPHY;  Surgeon: Burnell Blanks, MD;  Location: [facility];  Service: Cardiovascular;  Laterality: N/A;  . TEE WITHOUT CARDIOVERSION N/A 11/10/2018   Procedure: TRANSESOPHAGEAL ECHOCARDIOGRAM (TEE);  Surgeon: Gaye Pollack, MD;  Location: [facility];  Service: Open Heart Surgery;  Laterality: N/A;  . TRANSCATHETER AORTIC VALVE REPLACEMENT, TRANSFEMORAL     PROCEDURE ATTEMPTED & ABORTED   . UMBILICAL HERNIA REPAIR N/A 11/15/2019   Procedure: UMBILICAL HERNIA REPAIR;  Surgeon: Coralie Keens, MD;  Location: [facility];  Service: General;  Laterality: N/A;    Allergies  Allergen Reactions  . Flomax [Tamsulosin] Other (See Comments)    Made feel foggy headed  Current Outpatient Medications  Medication Sig Dispense Refill  . ALPRAZolam (XANAX) 0.25 MG tablet Take 0.25 mg by mouth at bedtime as needed for anxiety.    Marland Kitchen amoxicillin (AMOXIL) 500 MG capsule TAKE 4 CAPSULES BY MOUTH ONE TO TWO HOURS PRIOR TO DENTAL APPOINTMENT 16 capsule 1  . Apoaequorin 10 MG CAPS Take 10 mg by mouth daily.  (Patient not taking: Reported on 10/08/2020)    . aspirin EC 81 MG EC tablet Take 1 tablet (81 mg total) by mouth daily. Swallow whole. 21 tablet 0  . polyethylene glycol (MIRALAX / GLYCOLAX) 17 g packet Take 17 g by mouth daily. (Patient taking differently: Take 17 g by mouth daily as needed for mild constipation.) 14 each 0  . sildenafil (REVATIO) 20 MG tablet Take 20 mg by mouth daily as needed (ED). (Patient not  taking: Reported on 10/08/2020)    . traZODone (DESYREL) 100 MG tablet Take 50-100 mg by mouth at bedtime.    Marland Kitchen ZYPITAMAG 4 MG TABS TAKE 1 TABLET BY MOUTH DAILY. 90 tablet 1   No current facility-administered medications for this visit.    Family History  Problem Relation Age of Onset  . Heart attack Mother   . Other Father        unsure of history  . Heart disease Brother   . Dementia Sister     Social History   Socioeconomic History  . Marital status: Married    Spouse name: Not on file  . Number of children: 0  . Years of education: college  . Highest education level: Bachelor's degree (e.g., BA, AB, BS)  Occupational History  . Occupation: Retired-Human Resources  Tobacco Use  . Smoking status: Former Smoker    Years: 1.00    Quit date: 1970    Years since quitting: 52.3  . Smokeless tobacco: Never Used  Vaping Use  . Vaping Use: Never used  Substance and Sexual Activity  . Alcohol use: No  . Drug use: No  . Sexual activity: Not on file  Other Topics Concern  . Not on file  Social History Narrative   Caffeine: 2 cups daily   Right-handed.   Lives with legal spouse, [name].    Social Determinants of Health   Financial Resource Strain: Not on file  Food Insecurity: Not on file  Transportation Needs: Not on file  Physical Activity: Not on file  Stress: Not on file  Social Connections: Not on file  Intimate Partner Violence: Not on file     REVIEW OF SYSTEMS:   [X]  denotes positive finding, [ ]  denotes negative finding Cardiac  Comments:  Chest pain or chest pressure: x See HPI  Shortness of breath upon exertion:    Short of breath when lying flat:    Irregular heart rhythm:        Vascular    Pain in calf, thigh, or hip brought on by ambulation:    Pain in feet at night that wakes you up from your sleep:     Blood clot in your veins:    Leg swelling:         Pulmonary    Oxygen at home:    Productive cough:     Wheezing:         Neurologic     Sudden weakness in arms or legs:     Sudden numbness in arms or legs:     Sudden onset of difficulty speaking or slurred speech:  Temporary loss of vision in one eye:     Problems with dizziness:         Gastrointestinal    Blood in stool:     Vomited blood:         Genitourinary    Burning when urinating:     Blood in urine:        Psychiatric    Major depression:         Hematologic    Bleeding problems:    Problems with blood clotting too easily:        Skin    Rashes or ulcers:        Constitutional    Fever or chills:      PHYSICAL EXAMINATION:  Today's Vitals   10/23/20 1347 10/23/20 1353  BP: 125/76 117/71  Pulse: (!) 55   Resp: 20   Temp: 98.5 F (36.9 C)   TempSrc: Temporal   SpO2: 97%   Weight: 181 lb 6.4 oz (82.3 kg)   Height: 5\' 8"  (1.727 m)   PainSc: 9     Body mass index is 27.58 kg/m.   General:  WDWN in NAD; vital signs documented above Gait: Normal HENT: WNL, normocephalic Pulmonary: normal non-labored breathing Cardiac: regular HR, without carotid bruits Abdomen: soft, NT; aortic pulse is not palpable Skin: without rashes Vascular Exam/Pulses:  Right Left  Radial 2+ (normal) 2+ (normal)   Extremities: without ischemic changes, without Gangrene , without cellulitis; without open wounds Musculoskeletal: no muscle wasting or atrophy  Neurologic: A&O X 3; moving all extremities equally; speech is fluent/normal; very slight facial droop Psychiatric:  The pt has Normal affect.   Non-Invasive Vascular Imaging:   Carotid Duplex on 10/23/2020: Right:  1-39% ICA stenosis Left:  1-39% ICA stenosis   Previous Carotid duplex on 02/22/2020: Right: 1-39% ICA stenosis Left:   1-39% ICA stenosis    ASSESSMENT/PLAN:: 80 y.o. male here for follow up carotid artery stenosis and is s/p left CEA for symptomatic carotid artery stenosis on 03/08/2020 by Dr. Donnetta Hutching.  -duplex today reveals 1-39% bilateral ICA stenosis.  Pt was having left sided  headaches, which is not uncommon after endarterectomy.  These have resolved.   -pt has questions about vascular parkinsonism - would defer to neurology for further evaluation and discussion about this.  His husband plans to make appt. -discussed s/s of stroke with pt and he understands should he develop any of these sx, he will go to the nearest ER or call 911. -pt will f/u in one year with carotid duplex -pt will call sooner should they have any issues. -continue statin/asa   -pt states he has been having some pounding with his heart rate especially under stressful situations.  He does not have a hx of afib.  He does have hx of AVR.  He does have an appt with cardiology next month.  I advised him that if has this again sooner than his appt, to contact cardiology.  His husband states he plans to send a MyChart message to [facility] with cardiology.     Leontine Locket, [facility] [phone]  Clinic MD:  Scot Dock

## 2020-10-25 DIAGNOSIS — R531 Weakness: Secondary | ICD-10-CM | POA: Diagnosis not present

## 2020-10-25 DIAGNOSIS — R2681 Unsteadiness on feet: Secondary | ICD-10-CM | POA: Diagnosis not present

## 2020-10-25 DIAGNOSIS — R2689 Other abnormalities of gait and mobility: Secondary | ICD-10-CM | POA: Diagnosis not present

## 2020-10-25 DIAGNOSIS — R42 Dizziness and giddiness: Secondary | ICD-10-CM | POA: Diagnosis not present

## 2020-10-28 DIAGNOSIS — R2689 Other abnormalities of gait and mobility: Secondary | ICD-10-CM | POA: Diagnosis not present

## 2020-10-28 DIAGNOSIS — R531 Weakness: Secondary | ICD-10-CM | POA: Diagnosis not present

## 2020-10-28 DIAGNOSIS — R42 Dizziness and giddiness: Secondary | ICD-10-CM | POA: Diagnosis not present

## 2020-10-28 DIAGNOSIS — R2681 Unsteadiness on feet: Secondary | ICD-10-CM | POA: Diagnosis not present

## 2020-10-31 ENCOUNTER — Other Ambulatory Visit: Payer: Self-pay

## 2020-10-31 ENCOUNTER — Ambulatory Visit: Payer: Medicare HMO | Admitting: Podiatry

## 2020-10-31 ENCOUNTER — Ambulatory Visit: Payer: Medicare HMO

## 2020-10-31 ENCOUNTER — Encounter: Payer: Self-pay | Admitting: Podiatry

## 2020-10-31 ENCOUNTER — Encounter (HOSPITAL_COMMUNITY): Payer: Medicare HMO

## 2020-10-31 DIAGNOSIS — R2689 Other abnormalities of gait and mobility: Secondary | ICD-10-CM | POA: Diagnosis not present

## 2020-10-31 DIAGNOSIS — R42 Dizziness and giddiness: Secondary | ICD-10-CM | POA: Diagnosis not present

## 2020-10-31 DIAGNOSIS — Q828 Other specified congenital malformations of skin: Secondary | ICD-10-CM | POA: Diagnosis not present

## 2020-10-31 DIAGNOSIS — M21622 Bunionette of left foot: Secondary | ICD-10-CM

## 2020-10-31 DIAGNOSIS — R531 Weakness: Secondary | ICD-10-CM | POA: Diagnosis not present

## 2020-10-31 DIAGNOSIS — R2681 Unsteadiness on feet: Secondary | ICD-10-CM | POA: Diagnosis not present

## 2020-11-01 DIAGNOSIS — R2689 Other abnormalities of gait and mobility: Secondary | ICD-10-CM | POA: Diagnosis not present

## 2020-11-01 DIAGNOSIS — R2681 Unsteadiness on feet: Secondary | ICD-10-CM | POA: Diagnosis not present

## 2020-11-01 DIAGNOSIS — R531 Weakness: Secondary | ICD-10-CM | POA: Diagnosis not present

## 2020-11-01 DIAGNOSIS — R42 Dizziness and giddiness: Secondary | ICD-10-CM | POA: Diagnosis not present

## 2020-11-05 NOTE — Progress Notes (Signed)
Subjective:   Patient ID: Joshua Hess, male   DOB: 80 y.o.   MRN: [id number]   HPI Patient presents stating he still has inflammation and pain around the bone of his left fifth metatarsal and feels like there is still something in there   ROS      Objective:  Physical Exam  Neurovascular status intact with keratotic lesion still present plantarly with lucent core with pain and inflammation around the entire head of the fifth metatarsal left     Assessment:  Tailor's bunion deformity left with inflammatory orthotic libre protocol is also part of pathology     Plan:  H&P reviewed condition and recommended consideration of fifth metatarsal head resection educated him today on surgery and what would be required correction left.  Patient wants to have something done but is good to hold off currently I debrided the lesion and patient will be seen back depending on response.  Also made advised shoe gear modifications topical discussed

## 2020-11-09 DIAGNOSIS — I6523 Occlusion and stenosis of bilateral carotid arteries: Secondary | ICD-10-CM | POA: Diagnosis not present

## 2020-11-09 DIAGNOSIS — F5104 Psychophysiologic insomnia: Secondary | ICD-10-CM | POA: Diagnosis not present

## 2020-11-09 DIAGNOSIS — E7800 Pure hypercholesterolemia, unspecified: Secondary | ICD-10-CM | POA: Diagnosis not present

## 2020-11-11 ENCOUNTER — Encounter: Payer: Self-pay | Admitting: Neurology

## 2020-11-11 ENCOUNTER — Ambulatory Visit: Payer: Medicare HMO | Admitting: Neurology

## 2020-11-11 VITALS — BP 128/72 | HR 62 | Ht 68.0 in | Wt 181.0 lb

## 2020-11-11 DIAGNOSIS — R4189 Other symptoms and signs involving cognitive functions and awareness: Secondary | ICD-10-CM | POA: Diagnosis not present

## 2020-11-11 DIAGNOSIS — G250 Essential tremor: Secondary | ICD-10-CM | POA: Diagnosis not present

## 2020-11-11 DIAGNOSIS — R799 Abnormal finding of blood chemistry, unspecified: Secondary | ICD-10-CM | POA: Diagnosis not present

## 2020-11-11 DIAGNOSIS — F0390 Unspecified dementia without behavioral disturbance: Secondary | ICD-10-CM | POA: Diagnosis not present

## 2020-11-11 DIAGNOSIS — F418 Other specified anxiety disorders: Secondary | ICD-10-CM

## 2020-11-11 DIAGNOSIS — Z113 Encounter for screening for infections with a predominantly sexual mode of transmission: Secondary | ICD-10-CM | POA: Diagnosis not present

## 2020-11-11 DIAGNOSIS — R7989 Other specified abnormal findings of blood chemistry: Secondary | ICD-10-CM | POA: Diagnosis not present

## 2020-11-11 DIAGNOSIS — I779 Disorder of arteries and arterioles, unspecified: Secondary | ICD-10-CM

## 2020-11-11 DIAGNOSIS — E538 Deficiency of other specified B group vitamins: Secondary | ICD-10-CM | POA: Diagnosis not present

## 2020-11-11 DIAGNOSIS — E079 Disorder of thyroid, unspecified: Secondary | ICD-10-CM | POA: Diagnosis not present

## 2020-11-11 NOTE — Progress Notes (Signed)
ASSESSMENT AND PLAN 80 y.o. year old male  History of left hemispheric TIA, High-grade stenosis at left internal carotid artery, status post left internal carotid endarterectomy by Dr. Donnetta Hess on March 08, 2020, recovering well, on aspirin 81 mg daily,  Post surgical ultrasound of carotid artery, most recently in April 2022 showed no significant stenosis History of aortic valve replacement for aortic stenosis, bovine valve November 10, 2018, on aspirin alone  Vascular risk factor of aging, hypertension, hyperlipidemia, former smoker, mild coronary artery disease, status post aortic stenosis, bovine valve replacement  Continue moderate exercise, aspirin 81 mg daily  Dementia  MoCA examination is only 16 out of 30 today,  His memory loss is also complicated by his worsening depression anxiety, poor sleep quality,  Laboratory evaluation to rule out treatable etiology  Continue to work with primary care for better control of depression anxiety  Tremor:  Mostly postural and action component, no evidence of parkinsonian,  May consider beta-blocker, Inderal if needed  DIAGNOSTIC DATA (LABS, IMAGING, TESTING) - I reviewed patient records, labs, notes, testing and imaging myself where available. MRI of the brain without contrast March 06, 2020  1. No acute intracranial abnormality. 2. Generalized age-related cerebral atrophy with mild chronic small vessel ischemic disease.   CT angiogram April 29, 2020, CT head showed no acute abnormality 1. Interval Left Carotid Endarterectomy with resolved stenosis and no adverse features.  2. Stable vascular findings and atherosclerosis elsewhere since August. - no significant posterior circulation plaque or stenosis. - bilateral ICA siphon calcified plaque with mild stenosis. - up to 50% stenosis right subclavian artery origin.  Most recent Doppler study of bilateral carotid artery by vein and vascular surgeon Dr.   Deitra Mayo MD on  10/23/2020   Right Carotid: Velocities in the right ICA are consistent with a 1-39% stenosis. Left Carotid: Velocities in the left ICA are consistent with a 1-39% stenosis. Moderate amount of hypoechoic homogeneous plaque observed in the surgical site without hemodynamically signficant changes. Vertebrals: Bilateral vertebral arteries demonstrate antegrade flow. Subclavians: Normal flow hemodynamics were seen in bilateral subclavian arteries.   PHYSICAL EXAM Essential tremor Vitals:   11/11/20 0752  BP: 128/72  Pulse: 62  Weight: 181 lb (82.1 kg)  Height: 5\' 8"  (1.727 m)    PHYSICAL EXAMNIATION:  Gen: NAD, conversant, well nourised, well groomed        NEUROLOGICAL EXAM:  MENTAL STATUS: Speech/Cognition: Frustrated, hard of hearing, depend on his partner to provide history Montreal Cognitive Assessment  11/11/2020  Visuospatial/ Executive (0/5) 1  Naming (0/3) 3  Attention: Read list of digits (0/2) 2  Attention: Read list of letters (0/1) 0  Attention: Serial 7 subtraction starting at 100 (0/3) 1  Language: Repeat phrase (0/2) 2  Language : Fluency (0/1) 0  Abstraction (0/2) 2  Delayed Recall (0/5) 0  Orientation (0/6) 5  Total 16  Slight difficulty drawing spiral circle  CRANIAL NERVES: CN II: Visual fields are full to confrontation.  Pupils are round equal and briskly reactive to light. CN III, IV, VI: extraocular movement are normal. No ptosis. CN V: Facial sensation is intact to light touch. CN VII: Face is symmetric with normal eye closure and smile. CN VIII: Hearing is normal to casual conversation CN IX, X: Palate elevates symmetrically. Phonation is normal. CN XI: Head turning and shoulder shrug are intact   MOTOR: Mild bilateral hand posturing tremor, no significant rigidity, bradykinesia  REFLEXES: Reflexes are 2  and symmetric at the biceps,  triceps, knees and ankles. Plantar responses are flexor.  SENSORY: Intact to light touch, pinprick, positional  and vibratory sensation at fingers and toes.  COORDINATION: There is no trunk or limb ataxia.    GAIT/STANCE: Mildly antalgic, steady     HISTORY OF PRESENT ILLNESS: Joshua Hess is a 80 year old male, seen in request by his primary care physician Joshua Hess, Thayer Jew for evaluation of transient speech difficulty, he is accompanied by his spouse Joshua Hess at today's visit on August 07, 2019.  I have reviewed and summarized the referring note from the referring physician.  He has past medical history of hyperlipidemia, prostate cancer, carotid artery stenosis, aortic valve replacement, taking aspirin 81 mg daily.  On July 31, 2019, when he tried to communicate with Joshua Hess, he was noted to have word finding difficulties, he denied loss of consciousness, there was no confusion, apparently he is frustrated by his difficulty, recurrent similar symptoms last about few minutes, he was taken to the emergency room,  I personally reviewed CT head without contrast on July 31, 2019: No evidence of acute intracranial abnormality, supratentorium small vessel disease  Laboratory evaluation seen January 2021: CMP showed normal creatinine of 1, hemoglobin of 14.6,  Echocardiogram on January 26, 2019, normal ejection fraction 50 to 55%, impaired relaxation, anterior and lateral wall hypokinesia, mild mitral valve regurgitation, aortic root and ascending aorta are normal, prosthetic aortic valve was not well visualized, peak and mean gradients through the prosthesis at 33 and 53mmHg respectively  He is now back to his baseline, exercise regularly, including running and speed walking each day, about 3 weeks ago in early January 2021, while running, he fell down, landed on his right shoulder, still complains of right chest pain  Ultrasound of carotid artery was done by his primary care physician, report pending  Patient has mild hard of hearing, history is mainly from Joshua Hess, who reported patient began to  have mild memory loss since 2018, he tends to misplace things, sometimes word finding difficulties, he retired from [employer], 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  His difficulty become most noticeable following general anesthesia and aortic valve replacement in April, over the past few months all has improved.   UPDATE May 27 2020: He was admitted to hospital on March 05, 2020, when he was noted significant right facial droop March 04, 2020 after running for 3 miles  I personally reviewed MRI of brain on March 06, 2020, no acute abnormality, generalized atrophy, mild supratentorium small vessel disease CT angiogram of head and neck showed 70% atheromatous stenosis at the left carotid bifurcation, 50% stenosis at the origin of right subclavian artery, moderate 50% stenosis at the origin of left vertebral artery  Was seen by stroke physician Dr. Erlinda Hong, had left carotid endarterectomy by Dr. Eddie Dibbles early March 08, 2020, now recovering well, taking aspirin 81 mg daily, still running 3 miles a day, complains of intermittent bilateral hands tremor, but no weakness.  UPDATE Nov 11 2020: He is accompanied by his partner at today's clinical visit, he had increased depression anxiety, has frequent panic attack, feels frustrated easily, also noticed worsening memory loss, today's MoCA examination 16 out of 30, he was treated with Lexapro 10 mg daily since on April 2022, only slight improvement, likely will increase the dosage again during next primary care visit  He is very hard of hearing, lost his hearing aids today, feels frustrated, We personally reviewed CT angiogram April 29, 2020, CT head showed no acute  abnormality 1. Interval Left Carotid Endarterectomy with resolved stenosis and no adverse features.  2. Stable vascular findings and atherosclerosis elsewhere since August. - no significant posterior circulation plaque or stenosis. - bilateral ICA siphon calcified plaque with  mild stenosis. - up to 50% stenosis right subclavian artery origin.  Most recent Doppler study of bilateral carotid artery by vein and vascular surgeon Dr.   Deitra Mayo MD on 10/23/2020   Right Carotid: Velocities in the right ICA are consistent with a 1-39% stenosis. Left Carotid: Velocities in the left ICA are consistent with a 1-39% stenosis. Moderate amount of hypoechoic homogeneous plaque observed in the surgical site without hemodynamically signficant changes. Vertebrals: Bilateral vertebral arteries demonstrate antegrade flow. Subclavians: Normal flow hemodynamics were seen in bilateral subclavian arteries.  He retired from [employer], has difficulty staying sleep, often gets up very early like 3 AM in the morning,  REVIEW OF SYSTEMS: Out of a complete 14 system review of symptoms, the patient complains only of the following symptoms, and all other reviewed systems are negative.  Aphasia, hard of hearing  ALLERGIES: Allergies  Allergen Reactions  . Flomax [Tamsulosin] Other (See Comments)    Made feel foggy headed    HOME MEDICATIONS: Outpatient Medications Prior to Visit  Medication Sig Dispense Refill  . ALPRAZolam (XANAX) 0.25 MG tablet Take 0.25 mg by mouth at bedtime as needed for anxiety.    Marland Kitchen amoxicillin (AMOXIL) 500 MG capsule TAKE 4 CAPSULES BY MOUTH ONE TO TWO HOURS PRIOR TO DENTAL APPOINTMENT 16 capsule 1  . Apoaequorin 10 MG CAPS Take 10 mg by mouth daily.    Marland Kitchen aspirin EC 81 MG EC tablet Take 1 tablet (81 mg total) by mouth daily. Swallow whole. 21 tablet 0  . escitalopram (LEXAPRO) 10 MG tablet Take 10 mg by mouth daily.    . polyethylene glycol (MIRALAX / GLYCOLAX) 17 g packet Take 17 g by mouth daily. (Patient taking differently: Take 17 g by mouth daily as needed for mild constipation.) 14 each 0  . traZODone (DESYREL) 100 MG tablet Take 50-100 mg by mouth at bedtime.    Marland Kitchen ZYPITAMAG 4 MG TABS TAKE 1 TABLET BY MOUTH DAILY. 90 tablet 1  .  sildenafil (REVATIO) 20 MG tablet Take 20 mg by mouth daily as needed (ED). (Patient not taking: Reported on 10/08/2020)     No facility-administered medications prior to visit.    PAST MEDICAL HISTORY: Past Medical History:  Diagnosis Date  . Aphasia   . Cancer ([place])    behind ear- [place]  . Carotid artery stenosis   . Chronotropic incompetence   . ED (erectile dysfunction)   . Elevated PSA   . HOH (hard of hearing)   . Mild CAD   . Plantar fasciitis   . Postoperative atrial fibrillation ([place])   . Severe aortic stenosis    a. s/p tissue AVR 10/2018.  Marland Kitchen Sinus bradycardia    baseline  . Stroke ([place]) 03/05/2020   TIA  . Wears glasses     PAST SURGICAL HISTORY: Past Surgical History:  Procedure Laterality Date  . AORTIC VALVE REPLACEMENT N/A 11/10/2018   Procedure: AORTIC VALVE REPLACEMENT (AVR), USING 23MM INSPIRIS;  Surgeon: Gaye Pollack, MD;  Location: [facility];  Service: Open Heart Surgery;  Laterality: N/A;  . ENDARTERECTOMY Left 03/08/2020   Procedure: ENDARTERECTOMY CAROTID;  Surgeon: Rosetta Posner, MD;  Location: [facility];  Service: Vascular;  Laterality: Left;  . EXTERNAL EAR SURGERY  melanoma removed  . HERNIA REPAIR Right    inguinal  . INGUINAL HERNIA REPAIR Right 11/15/2019   Procedure: LAPAROSCOPIC RIGHT INGUINAL HERNIA REPAIR WITH MESH;  Surgeon: Coralie Keens, MD;  Location: [facility];  Service: General;  Laterality: Right;  . RIGHT/LEFT HEART CATH AND CORONARY ANGIOGRAPHY N/A 09/29/2018   Procedure: RIGHT/LEFT HEART CATH AND CORONARY ANGIOGRAPHY;  Surgeon: Burnell Blanks, MD;  Location: [facility];  Service: Cardiovascular;  Laterality: N/A;  . TEE WITHOUT CARDIOVERSION N/A 11/10/2018   Procedure: TRANSESOPHAGEAL ECHOCARDIOGRAM (TEE);  Surgeon: Gaye Pollack, MD;  Location: [facility];  Service: Open Heart Surgery;  Laterality: N/A;  . TRANSCATHETER AORTIC VALVE REPLACEMENT, TRANSFEMORAL     PROCEDURE ATTEMPTED & ABORTED   . UMBILICAL HERNIA REPAIR  N/A 11/15/2019   Procedure: UMBILICAL HERNIA REPAIR;  Surgeon: Coralie Keens, MD;  Location: [facility];  Service: General;  Laterality: N/A;    FAMILY HISTORY: Family History  Problem Relation Age of Onset  . Heart attack Mother   . Other Father        unsure of history  . Heart disease Brother   . Dementia Sister     SOCIAL HISTORY: Social History   Socioeconomic History  . Marital status: Married    Spouse name: Not on file  . Number of children: 0  . Years of education: college  . Highest education level: Bachelor's degree (e.g., BA, AB, BS)  Occupational History  . Occupation: Retired-Human Resources  Tobacco Use  . Smoking status: Former Smoker    Years: 1.00    Quit date: 1970    Years since quitting: 52.3  . Smokeless tobacco: Never Used  Vaping Use  . Vaping Use: Never used  Substance and Sexual Activity  . Alcohol use: No  . Drug use: No  . Sexual activity: Not on file  Other Topics Concern  . Not on file  Social History Narrative   Caffeine: 2 cups daily   Right-handed.   Lives with legal spouse, Joshua Hess.    Social Determinants of Health   Financial Resource Strain: Not on file  Food Insecurity: Not on file  Transportation Needs: Not on file  Physical Activity: Not on file  Stress: Not on file  Social Connections: Not on file  Intimate Partner Violence: Not on file      Marcial Pacas, M.D. Ph.D.  [facility] [place], [place] [address] Phone: [phone] Fax:      [phone]

## 2020-11-12 DIAGNOSIS — R42 Dizziness and giddiness: Secondary | ICD-10-CM | POA: Diagnosis not present

## 2020-11-12 DIAGNOSIS — R2689 Other abnormalities of gait and mobility: Secondary | ICD-10-CM | POA: Diagnosis not present

## 2020-11-12 DIAGNOSIS — R531 Weakness: Secondary | ICD-10-CM | POA: Diagnosis not present

## 2020-11-12 DIAGNOSIS — R2681 Unsteadiness on feet: Secondary | ICD-10-CM | POA: Diagnosis not present

## 2020-11-12 LAB — CBC WITH DIFFERENTIAL/PLATELET
Basophils Absolute: 0 10*3/uL (ref 0.0–0.2)
Basos: 0 %
EOS (ABSOLUTE): 0.2 10*3/uL (ref 0.0–0.4)
Eos: 3 %
Hematocrit: 45.5 % (ref 37.5–51.0)
Hemoglobin: 15.4 g/dL (ref 13.0–17.7)
Immature Grans (Abs): 0 10*3/uL (ref 0.0–0.1)
Immature Granulocytes: 0 %
Lymphocytes Absolute: 1.7 10*3/uL (ref 0.7–3.1)
Lymphs: 32 %
MCH: 33.3 pg — ABNORMAL HIGH (ref 26.6–33.0)
MCHC: 33.8 g/dL (ref 31.5–35.7)
MCV: 99 fL — ABNORMAL HIGH (ref 79–97)
Monocytes Absolute: 0.5 10*3/uL (ref 0.1–0.9)
Monocytes: 9 %
Neutrophils Absolute: 3 10*3/uL (ref 1.4–7.0)
Neutrophils: 56 %
Platelets: 178 10*3/uL (ref 150–450)
RBC: 4.62 x10E6/uL (ref 4.14–5.80)
RDW: 12.4 % (ref 11.6–15.4)
WBC: 5.4 10*3/uL (ref 3.4–10.8)

## 2020-11-12 LAB — COMPREHENSIVE METABOLIC PANEL
ALT: 26 IU/L (ref 0–44)
AST: 24 IU/L (ref 0–40)
Albumin/Globulin Ratio: 1.8 (ref 1.2–2.2)
Albumin: 4.2 g/dL (ref 3.7–4.7)
Alkaline Phosphatase: 53 IU/L (ref 44–121)
BUN/Creatinine Ratio: 19 (ref 10–24)
BUN: 20 mg/dL (ref 8–27)
Bilirubin Total: 0.7 mg/dL (ref 0.0–1.2)
CO2: 27 mmol/L (ref 20–29)
Calcium: 9.2 mg/dL (ref 8.6–10.2)
Chloride: 101 mmol/L (ref 96–106)
Creatinine, Ser: 1.03 mg/dL (ref 0.76–1.27)
Globulin, Total: 2.3 g/dL (ref 1.5–4.5)
Glucose: 87 mg/dL (ref 65–99)
Potassium: 4.7 mmol/L (ref 3.5–5.2)
Sodium: 140 mmol/L (ref 134–144)
Total Protein: 6.5 g/dL (ref 6.0–8.5)
eGFR: 73 mL/min/{1.73_m2} (ref >59)

## 2020-11-12 LAB — VITAMIN B12: Vitamin B-12: 570 pg/mL (ref 232–1245)

## 2020-11-12 LAB — TSH: TSH: 1.4 u[IU]/mL (ref 0.450–4.500)

## 2020-11-12 LAB — RPR: RPR Ser Ql: NONREACTIVE

## 2020-11-12 LAB — FOLATE: Folate: 13.9 ng/mL (ref >3.0)

## 2020-11-12 LAB — HIV ANTIBODY (ROUTINE TESTING W REFLEX): HIV Screen 4th Generation wRfx: NONREACTIVE

## 2020-11-14 DIAGNOSIS — R2689 Other abnormalities of gait and mobility: Secondary | ICD-10-CM | POA: Diagnosis not present

## 2020-11-14 DIAGNOSIS — R2681 Unsteadiness on feet: Secondary | ICD-10-CM | POA: Diagnosis not present

## 2020-11-14 DIAGNOSIS — R42 Dizziness and giddiness: Secondary | ICD-10-CM | POA: Diagnosis not present

## 2020-11-14 DIAGNOSIS — R531 Weakness: Secondary | ICD-10-CM | POA: Diagnosis not present

## 2020-11-25 DIAGNOSIS — C44622 Squamous cell carcinoma of skin of right upper limb, including shoulder: Secondary | ICD-10-CM | POA: Diagnosis not present

## 2020-11-25 DIAGNOSIS — Z85828 Personal history of other malignant neoplasm of skin: Secondary | ICD-10-CM | POA: Diagnosis not present

## 2020-11-25 DIAGNOSIS — Z85820 Personal history of malignant melanoma of skin: Secondary | ICD-10-CM | POA: Diagnosis not present

## 2020-11-25 DIAGNOSIS — C44629 Squamous cell carcinoma of skin of left upper limb, including shoulder: Secondary | ICD-10-CM | POA: Diagnosis not present

## 2020-11-25 DIAGNOSIS — D045 Carcinoma in situ of skin of trunk: Secondary | ICD-10-CM | POA: Diagnosis not present

## 2020-11-26 DIAGNOSIS — R2689 Other abnormalities of gait and mobility: Secondary | ICD-10-CM | POA: Diagnosis not present

## 2020-11-26 DIAGNOSIS — R531 Weakness: Secondary | ICD-10-CM | POA: Diagnosis not present

## 2020-11-26 DIAGNOSIS — R2681 Unsteadiness on feet: Secondary | ICD-10-CM | POA: Diagnosis not present

## 2020-11-26 DIAGNOSIS — R42 Dizziness and giddiness: Secondary | ICD-10-CM | POA: Diagnosis not present

## 2020-11-28 DIAGNOSIS — R531 Weakness: Secondary | ICD-10-CM | POA: Diagnosis not present

## 2020-11-28 DIAGNOSIS — R42 Dizziness and giddiness: Secondary | ICD-10-CM | POA: Diagnosis not present

## 2020-11-28 DIAGNOSIS — R2689 Other abnormalities of gait and mobility: Secondary | ICD-10-CM | POA: Diagnosis not present

## 2020-11-28 DIAGNOSIS — R2681 Unsteadiness on feet: Secondary | ICD-10-CM | POA: Diagnosis not present

## 2020-12-10 DIAGNOSIS — R531 Weakness: Secondary | ICD-10-CM | POA: Diagnosis not present

## 2020-12-10 DIAGNOSIS — R2681 Unsteadiness on feet: Secondary | ICD-10-CM | POA: Diagnosis not present

## 2020-12-10 DIAGNOSIS — R42 Dizziness and giddiness: Secondary | ICD-10-CM | POA: Diagnosis not present

## 2020-12-10 DIAGNOSIS — R2689 Other abnormalities of gait and mobility: Secondary | ICD-10-CM | POA: Diagnosis not present

## 2020-12-13 DIAGNOSIS — R2689 Other abnormalities of gait and mobility: Secondary | ICD-10-CM | POA: Diagnosis not present

## 2020-12-13 DIAGNOSIS — R2681 Unsteadiness on feet: Secondary | ICD-10-CM | POA: Diagnosis not present

## 2020-12-13 DIAGNOSIS — R42 Dizziness and giddiness: Secondary | ICD-10-CM | POA: Diagnosis not present

## 2020-12-13 DIAGNOSIS — R531 Weakness: Secondary | ICD-10-CM | POA: Diagnosis not present

## 2020-12-24 DIAGNOSIS — R531 Weakness: Secondary | ICD-10-CM | POA: Diagnosis not present

## 2020-12-24 DIAGNOSIS — R2681 Unsteadiness on feet: Secondary | ICD-10-CM | POA: Diagnosis not present

## 2020-12-24 DIAGNOSIS — R42 Dizziness and giddiness: Secondary | ICD-10-CM | POA: Diagnosis not present

## 2020-12-24 DIAGNOSIS — R2689 Other abnormalities of gait and mobility: Secondary | ICD-10-CM | POA: Diagnosis not present

## 2020-12-27 DIAGNOSIS — R42 Dizziness and giddiness: Secondary | ICD-10-CM | POA: Diagnosis not present

## 2020-12-27 DIAGNOSIS — R531 Weakness: Secondary | ICD-10-CM | POA: Diagnosis not present

## 2020-12-27 DIAGNOSIS — R2681 Unsteadiness on feet: Secondary | ICD-10-CM | POA: Diagnosis not present

## 2020-12-27 DIAGNOSIS — R2689 Other abnormalities of gait and mobility: Secondary | ICD-10-CM | POA: Diagnosis not present

## 2020-12-30 ENCOUNTER — Ambulatory Visit: Payer: Medicare HMO | Admitting: Vascular Surgery

## 2020-12-30 DIAGNOSIS — R531 Weakness: Secondary | ICD-10-CM | POA: Diagnosis not present

## 2020-12-30 DIAGNOSIS — R2689 Other abnormalities of gait and mobility: Secondary | ICD-10-CM | POA: Diagnosis not present

## 2020-12-30 DIAGNOSIS — R42 Dizziness and giddiness: Secondary | ICD-10-CM | POA: Diagnosis not present

## 2020-12-30 DIAGNOSIS — R2681 Unsteadiness on feet: Secondary | ICD-10-CM | POA: Diagnosis not present

## 2021-01-07 NOTE — Progress Notes (Signed)
Cardiology Office Note    Date:  01/08/2021   ID:  Joshua Hess [birth year], MRN [id number]   PCP:  Joshua Hess, [facility]  Cardiologist:  Joshua Chandler, MD   Advanced Practice Provider:  No care team member to display Electrophysiologist:  None   [id number]   Chief Complaint  Patient presents with   Chest Pain    History of Present Illness:  Joshua Hess is a 80 y.o. male with history of severe AS status post AVR 11/10/2018, postop A. fib, mild CAD and carotid disease.  He had been worked up for TAVR but a mobile mass was seen in the aortic sinus so this was canceled.  Mass was felt to be organized thrombus and inflammatory debris when it was surgically removed.  He had anxiety following his surgery followed by primary care.  Follow-up echo 01/2019 normal LVEF 50 to 55% bioprosthetic AVR working well mean gradient 60 mm or mercury.  Patient was added onto my schedule because some episodic chest tightness that he felt could be due to anxiety.  He was started on Lexapro by PCP.  Patient comes in with his partner. Sometimes has a soreness in his chest after working in the yard. Resolves with laying down after an hour. Does a 3-5 mile run/walk daily-no symptoms with activity but dizzy after. Exercises early am but works in yard 2-3 hrs daily in the heat. Says he's staying hydrated. Dizziness since carotid surgery last year and doing vestibular therapy and it's helping.Dizzy with standing and turning head quickly.No palpitations, dyspnea. He has anxiety over not being able to keep his yard immaculate like he wants. Has been working with his PCP and sports medicine doctor for these things. Labs 11/2020 normal.    Past Medical History:  Diagnosis Date   Aphasia    Cancer [facility])    behind ear- [place]   Carotid artery stenosis    Chronotropic incompetence    ED (erectile dysfunction)    Elevated PSA    HOH (hard of hearing)    Mild CAD     Plantar fasciitis    Postoperative atrial fibrillation (HCC)    Severe aortic stenosis    a. s/p tissue AVR 10/2018.   Sinus bradycardia    baseline   Stroke ([place]) 03/05/2020   TIA   Wears glasses     Past Surgical History:  Procedure Laterality Date   AORTIC VALVE REPLACEMENT N/A 11/10/2018   Procedure: AORTIC VALVE REPLACEMENT (AVR), USING 23MM INSPIRIS;  Surgeon: Gaye Pollack, MD;  Location: [place];  Service: Open Heart Surgery;  Laterality: N/A;   ENDARTERECTOMY Left 03/08/2020   Procedure: ENDARTERECTOMY CAROTID;  Surgeon: Rosetta Posner, MD;  Location: [place];  Service: Vascular;  Laterality: Left;   EXTERNAL EAR SURGERY     melanoma removed   HERNIA REPAIR Right    inguinal   INGUINAL HERNIA REPAIR Right 11/15/2019   Procedure: LAPAROSCOPIC RIGHT INGUINAL HERNIA REPAIR WITH MESH;  Surgeon: Coralie Keens, MD;  Location: [place];  Service: General;  Laterality: Right;   RIGHT/LEFT HEART CATH AND CORONARY ANGIOGRAPHY N/A 09/29/2018   Procedure: RIGHT/LEFT HEART CATH AND CORONARY ANGIOGRAPHY;  Surgeon: Burnell Blanks, MD;  Location: [place] [facility];  Service: Cardiovascular;  Laterality: N/A;   TEE WITHOUT CARDIOVERSION N/A 11/10/2018   Procedure: TRANSESOPHAGEAL ECHOCARDIOGRAM (TEE);  Surgeon: Gaye Pollack, MD;  Location: [place];  Service: Open Heart Surgery;  Laterality: N/A;   TRANSCATHETER AORTIC VALVE REPLACEMENT, TRANSFEMORAL     PROCEDURE ATTEMPTED & ABORTED    UMBILICAL HERNIA REPAIR N/A 11/15/2019   Procedure: UMBILICAL HERNIA REPAIR;  Surgeon: Coralie Keens, MD;  Location: [place];  Service: General;  Laterality: N/A;    Current Medications: Current Meds  Medication Sig   ALPRAZolam (XANAX) 0.5 MG tablet Take 1 tablet by mouth as needed.   amoxicillin (AMOXIL) 500 MG capsule TAKE 4 CAPSULES BY MOUTH ONE TO TWO HOURS PRIOR TO DENTAL APPOINTMENT   Apoaequorin 10 MG CAPS Take 10 mg by mouth daily.   aspirin EC 81 MG EC tablet Take 1 tablet (81 mg total) by  mouth daily. Swallow whole.   escitalopram (LEXAPRO) 10 MG tablet Take 20 mg by mouth daily.   fluoruracil (CARAC) 0.5 % cream 1 application   polyethylene glycol (MIRALAX / GLYCOLAX) 17 g packet Take 17 g by mouth daily. (Patient taking differently: Take 17 g by mouth daily as needed for mild constipation.)   traZODone (DESYREL) 100 MG tablet Take 50-100 mg by mouth at bedtime.   ZYPITAMAG 4 MG TABS TAKE 1 TABLET BY MOUTH DAILY.     Allergies:   Flomax [tamsulosin]   Social History   Socioeconomic History   Marital status: Married    Spouse name: Not on file   Number of children: 0   Years of education: college   Highest education level: Bachelor's degree (e.g., BA, AB, BS)  Occupational History   Occupation: Retired-Human Resources  Tobacco Use   Smoking status: Former    Years: 1.00    Pack years: 0.00    Types: Cigarettes    Quit date: 1970    Years since quitting: 52.5   Smokeless tobacco: Never  Vaping Use   Vaping Use: Never used  Substance and Sexual Activity   Alcohol use: No   Drug use: No   Sexual activity: Not on file  Other Topics Concern   Not on file  Social History Narrative   Caffeine: 2 cups daily   Right-handed.   Lives with legal spouse, Joshua Hess.    Social Determinants of Health   Financial Resource Strain: Not on file  Food Insecurity: Not on file  Transportation Needs: Not on file  Physical Activity: Not on file  Stress: Not on file  Social Connections: Not on file     Family History:  The patient's  family history includes Dementia in his sister; Heart attack in his mother; Heart disease in his brother; Other in his father.   ROS:   Please see the history of present illness.    ROS All other systems reviewed and are negative.   PHYSICAL EXAM:   VS:  BP 122/72   Pulse (!) 48   Ht 5\' 8"  (1.727 m)   Wt 181 lb 9.6 oz (82.4 kg)   SpO2 98%   BMI 27.61 kg/m   Physical Exam  GEN: Well nourished, well developed, in no acute distress   Neck: no JVD, carotid bruits, or masses Cardiac:RRR; 1/6 systolic murmur in right sternal border Respiratory:  clear to auscultation bilaterally, normal work of breathing GI: soft, nontender, nondistended, + BS Ext: without cyanosis, clubbing, or edema, Good distal pulses bilaterally Neuro:  Alert and Oriented x 3 Psych: euthymic mood, full affect  Wt Readings from Last 3 Encounters:  01/08/21 181 lb 9.6 oz (82.4 kg)  11/11/20 181 lb (82.1 kg)  10/23/20 181 lb 6.4 oz (82.3 kg)  Studies/Labs Reviewed:   EKG:  EKG is  ordered today.  The ekg ordered today demonstrates normal sinus rhythm nonspecific ST-T changes, no change from prior tracing  Recent Labs: 03/09/2020: Magnesium 1.9 11/11/2020: ALT 26; BUN 20; Creatinine, Ser 1.03; Hemoglobin 15.4; Platelets 178; Potassium 4.7; Sodium 140; TSH 1.400   Lipid Panel    Component Value Date/Time   CHOL 109 03/06/2020 0158   TRIG 49 03/06/2020 0158   HDL 47 03/06/2020 0158   CHOLHDL 2.3 03/06/2020 0158   VLDL 10 03/06/2020 0158   LDLCALC 52 03/06/2020 0158    Additional studies/ records that were reviewed today include:    Echo July 2020:  1. The left ventricle has low normal systolic function, with an ejection fraction of 50-55%. The cavity size was normal. Left ventricular diastolic Doppler parameters are consistent with impaired relaxation.  2. LVEF is normal at 50 to 55% with anterior and lateral hypokinesis.  3. The right ventricle has normal systolic function.  4. AV prosthesis is difficult to see. Peak and mean gradients through the prosthesis are 33 and 16 mm Hg respectively.  Cath 09/29/18 Prox RCA to Mid RCA lesion is 20% stenosed. Mid RCA lesion is 30% stenosed. Ost Cx to Prox Cx lesion is 30% stenosed. Prox LAD lesion is 10% stenosed. Mid LAD lesion is 20% stenosed. Mid LAD to Dist LAD lesion is 20% stenosed.   1. Mild non-obstructive CAD 2. Severe aortic stenosis by echo (by cath mean gradient 17. 42mmHg and  peak to peak gradient 24 mmHg)   Recommendations: Continue workup for TAVR    Risk Assessment/Calculations:         ASSESSMENT:    1. Chest pain, unspecified type   2. Dizziness   3. S/P AVR (aortic valve replacement)   4. Coronary artery disease involving native coronary artery of native heart without angina pectoris   5. Postoperative atrial fibrillation (HCC)      PLAN:  In order of problems listed above:  Chest soreness after a lot of bending and heavy lifting in the yard as well as sweating while working in the heat of the day.  EKG unchanged can walk run 3 to 5 miles daily without symptoms.  Mild CAD on cath.  Discussed possibly doing a stress test but do not feel it is necessary at this time.  Dizziness has been ongoing since carotid surgery and currently doing vestibular therapy which is helping.  No significant orthostasis in the office.  He is staying hydrated.  I did recommend he not work in the heat greater than 85 degrees and stay hydrated.  Aortic stenosis status post AVR 11/10/2018 working well on echo July 2020.  Continue aspirin and SBE prophylaxis prior to dental procedures and other indicated surgeries  CAD mild on cath 3 AVR 2020.  Did not tolerate beta-blockers due to fatigue.     Postop atrial fibrillation no recurrence not on anticoagulation  Anxiety/depression.  Long talk with the patient and his partner concerning his anxiety over not being able to do everything like he used to do.  Suggest he talk with Dr. Cheryln Manly who he has seen in the past.  Shared Decision Making/Informed Consent        Medication Adjustments/Labs and Tests Ordered: Current medicines are reviewed at length with the patient today.  Concerns regarding medicines are outlined above.  Medication changes, Labs and Tests ordered today are listed in the Patient Instructions below. Patient Instructions  Medication Instructions:  Your physician  recommends that you continue on your  current medications as directed. Please refer to the Current Medication list given to you today.  *If you need a refill on your cardiac medications before your next appointment, please call your pharmacy*   Lab Work: None If you have labs (blood work) drawn today and your tests are completely normal, you will receive your results only by: [place] (if you have MyChart) OR A paper copy in the mail If you have any lab test that is abnormal or we need to change your treatment, we will call you to review the results.   Follow-Up: At [facility], you and your health needs are our priority.  As part of our continuing mission to provide you with exceptional heart care, we have created designated Provider Care Teams.  These Care Teams include your primary Cardiologist (physician) and Advanced Practice Providers (APPs -  Physician Assistants and Nurse Practitioners) who all work together to provide you with the care you need, when you need it.  Your next appointment:   6 month(s)  The format for your next appointment:   In Person  Provider:   You may see Joshua Chandler, MD or one of the following Advanced Practice Providers on your designated Care Team:   Melina Copa, PA-C Ermalinda Barrios, PA-C     Signed, Ermalinda Barrios, PA-C  01/08/2021 10:05 AM    [address], [place], [place]  [address] Phone: [phone]; Fax: [phone]

## 2021-01-08 ENCOUNTER — Other Ambulatory Visit: Payer: Self-pay

## 2021-01-08 ENCOUNTER — Ambulatory Visit: Payer: Medicare HMO | Admitting: Physician Assistant

## 2021-01-08 ENCOUNTER — Encounter: Payer: Self-pay | Admitting: Physician Assistant

## 2021-01-08 VITALS — BP 122/72 | HR 48 | Ht 68.0 in | Wt 181.6 lb

## 2021-01-08 DIAGNOSIS — Z952 Presence of prosthetic heart valve: Secondary | ICD-10-CM

## 2021-01-08 DIAGNOSIS — R42 Dizziness and giddiness: Secondary | ICD-10-CM | POA: Diagnosis not present

## 2021-01-08 DIAGNOSIS — R2681 Unsteadiness on feet: Secondary | ICD-10-CM | POA: Diagnosis not present

## 2021-01-08 DIAGNOSIS — I2510 Atherosclerotic heart disease of native coronary artery without angina pectoris: Secondary | ICD-10-CM | POA: Diagnosis not present

## 2021-01-08 DIAGNOSIS — R079 Chest pain, unspecified: Secondary | ICD-10-CM | POA: Diagnosis not present

## 2021-01-08 DIAGNOSIS — I9789 Other postprocedural complications and disorders of the circulatory system, not elsewhere classified: Secondary | ICD-10-CM

## 2021-01-08 DIAGNOSIS — R2689 Other abnormalities of gait and mobility: Secondary | ICD-10-CM | POA: Diagnosis not present

## 2021-01-08 DIAGNOSIS — R531 Weakness: Secondary | ICD-10-CM | POA: Diagnosis not present

## 2021-01-08 DIAGNOSIS — I4891 Unspecified atrial fibrillation: Secondary | ICD-10-CM | POA: Diagnosis not present

## 2021-01-08 NOTE — Patient Instructions (Signed)
Medication Instructions:  Your physician recommends that you continue on your current medications as directed. Please refer to the Current Medication list given to you today.  *If you need a refill on your cardiac medications before your next appointment, please call your pharmacy*   Lab Work: None If you have labs (blood work) drawn today and your tests are completely normal, you will receive your results only by: [place] (if you have MyChart) OR A paper copy in the mail If you have any lab test that is abnormal or we need to change your treatment, we will call you to review the results.   Follow-Up: At [facility], you and your health needs are our priority.  As part of our continuing mission to provide you with exceptional heart care, we have created designated Provider Care Teams.  These Care Teams include your primary Cardiologist (physician) and Advanced Practice Providers (APPs -  Physician Assistants and Nurse Practitioners) who all work together to provide you with the care you need, when you need it.  Your next appointment:   6 month(s)  The format for your next appointment:   In Person  Provider:   You may see Lauree Chandler, MD or one of the following Advanced Practice Providers on your designated Care Team:   Melina Copa, PA-C Ermalinda Barrios, PA-C

## 2021-01-09 DIAGNOSIS — I6523 Occlusion and stenosis of bilateral carotid arteries: Secondary | ICD-10-CM | POA: Diagnosis not present

## 2021-01-09 DIAGNOSIS — F5104 Psychophysiologic insomnia: Secondary | ICD-10-CM | POA: Diagnosis not present

## 2021-01-09 DIAGNOSIS — E7800 Pure hypercholesterolemia, unspecified: Secondary | ICD-10-CM | POA: Diagnosis not present

## 2021-01-10 DIAGNOSIS — R531 Weakness: Secondary | ICD-10-CM | POA: Diagnosis not present

## 2021-01-10 DIAGNOSIS — R2681 Unsteadiness on feet: Secondary | ICD-10-CM | POA: Diagnosis not present

## 2021-01-10 DIAGNOSIS — R42 Dizziness and giddiness: Secondary | ICD-10-CM | POA: Diagnosis not present

## 2021-01-10 DIAGNOSIS — R2689 Other abnormalities of gait and mobility: Secondary | ICD-10-CM | POA: Diagnosis not present

## 2021-01-14 DIAGNOSIS — R42 Dizziness and giddiness: Secondary | ICD-10-CM | POA: Diagnosis not present

## 2021-01-14 DIAGNOSIS — R2681 Unsteadiness on feet: Secondary | ICD-10-CM | POA: Diagnosis not present

## 2021-01-14 DIAGNOSIS — R531 Weakness: Secondary | ICD-10-CM | POA: Diagnosis not present

## 2021-01-14 DIAGNOSIS — R2689 Other abnormalities of gait and mobility: Secondary | ICD-10-CM | POA: Diagnosis not present

## 2021-01-17 DIAGNOSIS — R42 Dizziness and giddiness: Secondary | ICD-10-CM | POA: Diagnosis not present

## 2021-01-17 DIAGNOSIS — R2681 Unsteadiness on feet: Secondary | ICD-10-CM | POA: Diagnosis not present

## 2021-01-17 DIAGNOSIS — R2689 Other abnormalities of gait and mobility: Secondary | ICD-10-CM | POA: Diagnosis not present

## 2021-01-17 DIAGNOSIS — R531 Weakness: Secondary | ICD-10-CM | POA: Diagnosis not present

## 2021-01-21 DIAGNOSIS — R2689 Other abnormalities of gait and mobility: Secondary | ICD-10-CM | POA: Diagnosis not present

## 2021-01-21 DIAGNOSIS — R42 Dizziness and giddiness: Secondary | ICD-10-CM | POA: Diagnosis not present

## 2021-01-21 DIAGNOSIS — R2681 Unsteadiness on feet: Secondary | ICD-10-CM | POA: Diagnosis not present

## 2021-01-21 DIAGNOSIS — R531 Weakness: Secondary | ICD-10-CM | POA: Diagnosis not present

## 2021-01-22 ENCOUNTER — Ambulatory Visit: Payer: Medicare HMO | Admitting: Podiatry

## 2021-01-22 ENCOUNTER — Other Ambulatory Visit: Payer: Self-pay

## 2021-01-22 ENCOUNTER — Encounter: Payer: Self-pay | Admitting: Podiatry

## 2021-01-22 DIAGNOSIS — L03031 Cellulitis of right toe: Secondary | ICD-10-CM

## 2021-01-23 NOTE — Progress Notes (Signed)
Subjective:   Patient ID: Joshua Hess, male   DOB: 80 y.o.   MRN: [id number]   HPI Patient presents concerned about discoloration of the nailbed and is concerned that this might be something that skin to be a problem   ROS      Objective:  Physical Exam  Neurovascular status intact with discoloration right hallux nail localized no proximal edema erythema drainage noted     Assessment:  Traumatized nail localized no indications of other pathology     Plan:  H&P educated patient on this and discussed the possibility that this may require removal or other treatment.  Explained soak and wider shoe reappoint as needed

## 2021-01-24 DIAGNOSIS — R42 Dizziness and giddiness: Secondary | ICD-10-CM | POA: Diagnosis not present

## 2021-01-24 DIAGNOSIS — R531 Weakness: Secondary | ICD-10-CM | POA: Diagnosis not present

## 2021-01-24 DIAGNOSIS — R2689 Other abnormalities of gait and mobility: Secondary | ICD-10-CM | POA: Diagnosis not present

## 2021-01-24 DIAGNOSIS — R2681 Unsteadiness on feet: Secondary | ICD-10-CM | POA: Diagnosis not present

## 2021-01-27 DIAGNOSIS — D692 Other nonthrombocytopenic purpura: Secondary | ICD-10-CM | POA: Diagnosis not present

## 2021-01-27 DIAGNOSIS — C44529 Squamous cell carcinoma of skin of other part of trunk: Secondary | ICD-10-CM | POA: Diagnosis not present

## 2021-01-27 DIAGNOSIS — Z85828 Personal history of other malignant neoplasm of skin: Secondary | ICD-10-CM | POA: Diagnosis not present

## 2021-01-27 DIAGNOSIS — L814 Other melanin hyperpigmentation: Secondary | ICD-10-CM | POA: Diagnosis not present

## 2021-01-27 DIAGNOSIS — L570 Actinic keratosis: Secondary | ICD-10-CM | POA: Diagnosis not present

## 2021-01-27 DIAGNOSIS — C44622 Squamous cell carcinoma of skin of right upper limb, including shoulder: Secondary | ICD-10-CM | POA: Diagnosis not present

## 2021-01-27 DIAGNOSIS — C44722 Squamous cell carcinoma of skin of right lower limb, including hip: Secondary | ICD-10-CM | POA: Diagnosis not present

## 2021-01-27 DIAGNOSIS — L821 Other seborrheic keratosis: Secondary | ICD-10-CM | POA: Diagnosis not present

## 2021-01-27 DIAGNOSIS — Z85820 Personal history of malignant melanoma of skin: Secondary | ICD-10-CM | POA: Diagnosis not present

## 2021-01-28 DIAGNOSIS — R531 Weakness: Secondary | ICD-10-CM | POA: Diagnosis not present

## 2021-01-28 DIAGNOSIS — R2689 Other abnormalities of gait and mobility: Secondary | ICD-10-CM | POA: Diagnosis not present

## 2021-01-28 DIAGNOSIS — R2681 Unsteadiness on feet: Secondary | ICD-10-CM | POA: Diagnosis not present

## 2021-01-28 DIAGNOSIS — R42 Dizziness and giddiness: Secondary | ICD-10-CM | POA: Diagnosis not present

## 2021-01-31 DIAGNOSIS — R531 Weakness: Secondary | ICD-10-CM | POA: Diagnosis not present

## 2021-01-31 DIAGNOSIS — R2689 Other abnormalities of gait and mobility: Secondary | ICD-10-CM | POA: Diagnosis not present

## 2021-01-31 DIAGNOSIS — R42 Dizziness and giddiness: Secondary | ICD-10-CM | POA: Diagnosis not present

## 2021-01-31 DIAGNOSIS — R2681 Unsteadiness on feet: Secondary | ICD-10-CM | POA: Diagnosis not present

## 2021-02-03 DIAGNOSIS — R2689 Other abnormalities of gait and mobility: Secondary | ICD-10-CM | POA: Diagnosis not present

## 2021-02-03 DIAGNOSIS — R2681 Unsteadiness on feet: Secondary | ICD-10-CM | POA: Diagnosis not present

## 2021-02-03 DIAGNOSIS — R531 Weakness: Secondary | ICD-10-CM | POA: Diagnosis not present

## 2021-02-03 DIAGNOSIS — R42 Dizziness and giddiness: Secondary | ICD-10-CM | POA: Diagnosis not present

## 2021-02-09 DIAGNOSIS — I6523 Occlusion and stenosis of bilateral carotid arteries: Secondary | ICD-10-CM | POA: Diagnosis not present

## 2021-02-09 DIAGNOSIS — F5104 Psychophysiologic insomnia: Secondary | ICD-10-CM | POA: Diagnosis not present

## 2021-02-09 DIAGNOSIS — E7800 Pure hypercholesterolemia, unspecified: Secondary | ICD-10-CM | POA: Diagnosis not present

## 2021-02-14 DIAGNOSIS — R42 Dizziness and giddiness: Secondary | ICD-10-CM | POA: Diagnosis not present

## 2021-02-14 DIAGNOSIS — R531 Weakness: Secondary | ICD-10-CM | POA: Diagnosis not present

## 2021-02-14 DIAGNOSIS — R2681 Unsteadiness on feet: Secondary | ICD-10-CM | POA: Diagnosis not present

## 2021-02-14 DIAGNOSIS — R2689 Other abnormalities of gait and mobility: Secondary | ICD-10-CM | POA: Diagnosis not present

## 2021-02-18 DIAGNOSIS — R2689 Other abnormalities of gait and mobility: Secondary | ICD-10-CM | POA: Diagnosis not present

## 2021-02-18 DIAGNOSIS — R531 Weakness: Secondary | ICD-10-CM | POA: Diagnosis not present

## 2021-02-18 DIAGNOSIS — R42 Dizziness and giddiness: Secondary | ICD-10-CM | POA: Diagnosis not present

## 2021-02-18 DIAGNOSIS — R2681 Unsteadiness on feet: Secondary | ICD-10-CM | POA: Diagnosis not present

## 2021-02-20 DIAGNOSIS — R42 Dizziness and giddiness: Secondary | ICD-10-CM | POA: Diagnosis not present

## 2021-02-20 DIAGNOSIS — R2681 Unsteadiness on feet: Secondary | ICD-10-CM | POA: Diagnosis not present

## 2021-02-20 DIAGNOSIS — R531 Weakness: Secondary | ICD-10-CM | POA: Diagnosis not present

## 2021-02-20 DIAGNOSIS — R2689 Other abnormalities of gait and mobility: Secondary | ICD-10-CM | POA: Diagnosis not present

## 2021-02-25 DIAGNOSIS — R2681 Unsteadiness on feet: Secondary | ICD-10-CM | POA: Diagnosis not present

## 2021-02-25 DIAGNOSIS — R2689 Other abnormalities of gait and mobility: Secondary | ICD-10-CM | POA: Diagnosis not present

## 2021-02-25 DIAGNOSIS — R42 Dizziness and giddiness: Secondary | ICD-10-CM | POA: Diagnosis not present

## 2021-02-25 DIAGNOSIS — R531 Weakness: Secondary | ICD-10-CM | POA: Diagnosis not present

## 2021-03-31 DIAGNOSIS — L570 Actinic keratosis: Secondary | ICD-10-CM | POA: Diagnosis not present

## 2021-03-31 DIAGNOSIS — L565 Disseminated superficial actinic porokeratosis (DSAP): Secondary | ICD-10-CM | POA: Diagnosis not present

## 2021-03-31 DIAGNOSIS — L910 Hypertrophic scar: Secondary | ICD-10-CM | POA: Diagnosis not present

## 2021-03-31 DIAGNOSIS — Z85820 Personal history of malignant melanoma of skin: Secondary | ICD-10-CM | POA: Diagnosis not present

## 2021-03-31 DIAGNOSIS — D045 Carcinoma in situ of skin of trunk: Secondary | ICD-10-CM | POA: Diagnosis not present

## 2021-03-31 DIAGNOSIS — Z85828 Personal history of other malignant neoplasm of skin: Secondary | ICD-10-CM | POA: Diagnosis not present

## 2021-04-11 ENCOUNTER — Other Ambulatory Visit: Payer: Self-pay | Admitting: Family Medicine

## 2021-04-11 ENCOUNTER — Encounter: Payer: Self-pay | Admitting: Family Medicine

## 2021-04-14 NOTE — Telephone Encounter (Signed)
Patient called following up on this request. He is completely out, please advise on refill.

## 2021-04-16 DIAGNOSIS — R972 Elevated prostate specific antigen [PSA]: Secondary | ICD-10-CM | POA: Diagnosis not present

## 2021-04-23 DIAGNOSIS — N401 Enlarged prostate with lower urinary tract symptoms: Secondary | ICD-10-CM | POA: Diagnosis not present

## 2021-04-23 DIAGNOSIS — R972 Elevated prostate specific antigen [PSA]: Secondary | ICD-10-CM | POA: Diagnosis not present

## 2021-04-23 DIAGNOSIS — N3941 Urge incontinence: Secondary | ICD-10-CM | POA: Diagnosis not present

## 2021-04-29 DIAGNOSIS — H2513 Age-related nuclear cataract, bilateral: Secondary | ICD-10-CM | POA: Diagnosis not present

## 2021-04-29 DIAGNOSIS — H524 Presbyopia: Secondary | ICD-10-CM | POA: Diagnosis not present

## 2021-04-29 DIAGNOSIS — H0100A Unspecified blepharitis right eye, upper and lower eyelids: Secondary | ICD-10-CM | POA: Diagnosis not present

## 2021-04-29 DIAGNOSIS — H43813 Vitreous degeneration, bilateral: Secondary | ICD-10-CM | POA: Diagnosis not present

## 2021-04-30 ENCOUNTER — Ambulatory Visit: Payer: Medicare HMO | Admitting: Podiatry

## 2021-04-30 ENCOUNTER — Encounter: Payer: Self-pay | Admitting: Podiatry

## 2021-04-30 ENCOUNTER — Other Ambulatory Visit: Payer: Self-pay

## 2021-04-30 DIAGNOSIS — M79675 Pain in left toe(s): Secondary | ICD-10-CM

## 2021-04-30 DIAGNOSIS — B351 Tinea unguium: Secondary | ICD-10-CM

## 2021-04-30 DIAGNOSIS — M79674 Pain in right toe(s): Secondary | ICD-10-CM | POA: Diagnosis not present

## 2021-04-30 NOTE — Progress Notes (Signed)
This patient returns to the office for evaluation and treatment of long thick painful  big nails .  This patient is unable to trim his own nails since the patient cannot reach his feet.  Patient says the nails are painful walking and wearing his shoes.  He returns for preventive foot care services.  General Appearance  Alert, conversant and in no acute stress.  Vascular  Dorsalis pedis and posterior tibial  pulses are palpable  bilaterally.  Capillary return is within normal limits  bilaterally. Temperature is within normal limits  bilaterally.  Neurologic  Senn-Weinstein monofilament wire test within normal limits  bilaterally. Muscle power within normal limits bilaterally.  Nails Thick disfigured discolored nails with subungual debris  hallux nails  bilaterally. Hematoma history right hallux nail.No evidence of bacterial infection or drainage bilaterally.  Orthopedic  No limitations of motion  feet .  No crepitus or effusions noted.  No bony pathology or digital deformities noted.  Skin  normotropic skin with no porokeratosis noted bilaterally.  No signs of infections or ulcers noted.     Onychomycosis  Pain in toes right foot  Pain in toes left foot  Debridement  of nails  1-5  B/L with a nail nipper.  Nails were then filed using a dremel tool with no incidents.    RTC 3 months.   Gardiner Barefoot DPM

## 2021-05-09 DIAGNOSIS — Z125 Encounter for screening for malignant neoplasm of prostate: Secondary | ICD-10-CM | POA: Diagnosis not present

## 2021-05-09 DIAGNOSIS — I6523 Occlusion and stenosis of bilateral carotid arteries: Secondary | ICD-10-CM | POA: Diagnosis not present

## 2021-05-12 DIAGNOSIS — I6523 Occlusion and stenosis of bilateral carotid arteries: Secondary | ICD-10-CM | POA: Diagnosis not present

## 2021-05-12 DIAGNOSIS — F5104 Psychophysiologic insomnia: Secondary | ICD-10-CM | POA: Diagnosis not present

## 2021-05-12 DIAGNOSIS — E7800 Pure hypercholesterolemia, unspecified: Secondary | ICD-10-CM | POA: Diagnosis not present

## 2021-05-16 DIAGNOSIS — I38 Endocarditis, valve unspecified: Secondary | ICD-10-CM | POA: Diagnosis not present

## 2021-05-16 DIAGNOSIS — F332 Major depressive disorder, recurrent severe without psychotic features: Secondary | ICD-10-CM | POA: Diagnosis not present

## 2021-05-16 DIAGNOSIS — R519 Headache, unspecified: Secondary | ICD-10-CM | POA: Diagnosis not present

## 2021-05-16 DIAGNOSIS — Z0000 Encounter for general adult medical examination without abnormal findings: Secondary | ICD-10-CM | POA: Diagnosis not present

## 2021-05-16 DIAGNOSIS — R238 Other skin changes: Secondary | ICD-10-CM | POA: Diagnosis not present

## 2021-05-16 DIAGNOSIS — F5104 Psychophysiologic insomnia: Secondary | ICD-10-CM | POA: Diagnosis not present

## 2021-05-16 DIAGNOSIS — G8929 Other chronic pain: Secondary | ICD-10-CM | POA: Diagnosis not present

## 2021-05-16 DIAGNOSIS — F329 Major depressive disorder, single episode, unspecified: Secondary | ICD-10-CM | POA: Diagnosis not present

## 2021-05-16 DIAGNOSIS — R053 Chronic cough: Secondary | ICD-10-CM | POA: Diagnosis not present

## 2021-05-16 DIAGNOSIS — I6523 Occlusion and stenosis of bilateral carotid arteries: Secondary | ICD-10-CM | POA: Diagnosis not present

## 2021-05-29 DIAGNOSIS — D489 Neoplasm of uncertain behavior, unspecified: Secondary | ICD-10-CM | POA: Diagnosis not present

## 2021-05-29 DIAGNOSIS — D0472 Carcinoma in situ of skin of left lower limb, including hip: Secondary | ICD-10-CM | POA: Diagnosis not present

## 2021-05-29 DIAGNOSIS — C44729 Squamous cell carcinoma of skin of left lower limb, including hip: Secondary | ICD-10-CM | POA: Diagnosis not present

## 2021-05-29 DIAGNOSIS — Z85820 Personal history of malignant melanoma of skin: Secondary | ICD-10-CM | POA: Diagnosis not present

## 2021-05-29 DIAGNOSIS — D485 Neoplasm of uncertain behavior of skin: Secondary | ICD-10-CM | POA: Diagnosis not present

## 2021-05-29 DIAGNOSIS — D692 Other nonthrombocytopenic purpura: Secondary | ICD-10-CM | POA: Diagnosis not present

## 2021-05-29 DIAGNOSIS — D0462 Carcinoma in situ of skin of left upper limb, including shoulder: Secondary | ICD-10-CM | POA: Diagnosis not present

## 2021-05-29 DIAGNOSIS — Z85828 Personal history of other malignant neoplasm of skin: Secondary | ICD-10-CM | POA: Diagnosis not present

## 2021-05-29 DIAGNOSIS — L570 Actinic keratosis: Secondary | ICD-10-CM | POA: Diagnosis not present

## 2021-05-29 DIAGNOSIS — L821 Other seborrheic keratosis: Secondary | ICD-10-CM | POA: Diagnosis not present

## 2021-05-29 DIAGNOSIS — L814 Other melanin hyperpigmentation: Secondary | ICD-10-CM | POA: Diagnosis not present

## 2021-06-11 DIAGNOSIS — E7800 Pure hypercholesterolemia, unspecified: Secondary | ICD-10-CM | POA: Diagnosis not present

## 2021-06-11 DIAGNOSIS — I6523 Occlusion and stenosis of bilateral carotid arteries: Secondary | ICD-10-CM | POA: Diagnosis not present

## 2021-06-11 DIAGNOSIS — F5104 Psychophysiologic insomnia: Secondary | ICD-10-CM | POA: Diagnosis not present

## 2021-06-25 DIAGNOSIS — R202 Paresthesia of skin: Secondary | ICD-10-CM | POA: Diagnosis not present

## 2021-06-26 NOTE — Progress Notes (Signed)
Chief Complaint  Patient presents with   Follow-up    Aortic valve disease     History of Present Illness: 80 yo male with history of severe aortic stenosis s/p AVR, post op atrial fib, mild CAD, mild mitral regurgitation and carotid artery disease who is here today for cardiac follow up. He underwent surgical AVR 11/10/18. He had been worked up for TAVR but a mobile mass was seen in the aortic sinus so the TAVR was cancelled. The mass was felt to be organized thrombus and inflammatory debris when is was surgically removed. He was started on ASA and Metoprolol in the post-operative period. Due to a brief run of atrial fib post op, amiodarone was used but stopped due to nausea. He continued to struggle with nausea and fatigue post op so metoprolol was stopped. Echo 01/26/19 with LVEF=50-55%. The bioprosthetic AVR was working well with mean gradient 16 mmHg. CVA in August 2021. His carotid artery disease in followed in [facility] by Dr. Donnetta Hutching. He underwent left carotid endarterectomy on 03/08/20. Carotid artery dopplers April 2022 with mild stable disease. Echo 03/06/20 with LVEF=50-55%, mild MR, normally functioning AVR.   He is here today for follow up. The patient denies any chest pain, dyspnea, palpitations, lower extremity edema, orthopnea, PND, dizziness, near syncope or syncope. He is still having balance issues. Not as active but wants to get back to running.    Primary Care Physician: [name], MD  Past Medical History:  Diagnosis Date   Aphasia    Cancer [facility])    behind ear- [place]   Carotid artery stenosis    Chronotropic incompetence    ED (erectile dysfunction)    Elevated PSA    HOH (hard of hearing)    Mild CAD    Plantar fasciitis    Postoperative atrial fibrillation (HCC)    Severe aortic stenosis    a. s/p tissue AVR 10/2018.   Sinus bradycardia    baseline   Stroke ([place]) 03/05/2020   TIA   Wears glasses     Past Surgical History:  Procedure Laterality  Date   AORTIC VALVE REPLACEMENT N/A 11/10/2018   Procedure: AORTIC VALVE REPLACEMENT (AVR), USING 23MM INSPIRIS;  Surgeon: Gaye Pollack, MD;  Location: [place];  Service: Open Heart Surgery;  Laterality: N/A;   ENDARTERECTOMY Left 03/08/2020   Procedure: ENDARTERECTOMY CAROTID;  Surgeon: Rosetta Posner, MD;  Location: [facility];  Service: Vascular;  Laterality: Left;   EXTERNAL EAR SURGERY     melanoma removed   HERNIA REPAIR Right    inguinal   INGUINAL HERNIA REPAIR Right 11/15/2019   Procedure: LAPAROSCOPIC RIGHT INGUINAL HERNIA REPAIR WITH MESH;  Surgeon: Coralie Keens, MD;  Location: [facility];  Service: General;  Laterality: Right;   RIGHT/LEFT HEART CATH AND CORONARY ANGIOGRAPHY N/A 09/29/2018   Procedure: RIGHT/LEFT HEART CATH AND CORONARY ANGIOGRAPHY;  Surgeon: Burnell Blanks, MD;  Location: [facility];  Service: Cardiovascular;  Laterality: N/A;   TEE WITHOUT CARDIOVERSION N/A 11/10/2018   Procedure: TRANSESOPHAGEAL ECHOCARDIOGRAM (TEE);  Surgeon: Gaye Pollack, MD;  Location: [facility];  Service: Open Heart Surgery;  Laterality: N/A;   TRANSCATHETER AORTIC VALVE REPLACEMENT, TRANSFEMORAL     PROCEDURE ATTEMPTED & ABORTED    UMBILICAL HERNIA REPAIR N/A 11/15/2019   Procedure: UMBILICAL HERNIA REPAIR;  Surgeon: Coralie Keens, MD;  Location: [facility];  Service: General;  Laterality: N/A;    Current Outpatient Medications  Medication Sig Dispense Refill  ALPRAZolam (XANAX) 0.5 MG tablet Take 1 tablet by mouth as needed (as directed).     amoxicillin (AMOXIL) 500 MG capsule TAKE 4 CAPSULES BY MOUTH ONE TO TWO HOURS PRIOR TO DENTAL APPOINTMENT 16 capsule 1   Apoaequorin 10 MG CAPS Take 10 mg by mouth daily.     aspirin EC 81 MG EC tablet Take 1 tablet (81 mg total) by mouth daily. Swallow whole. 21 tablet 0   escitalopram (LEXAPRO) 20 MG tablet Take 1 tablet by mouth daily.     fluoruracil (CARAC) 0.5 % cream as directed.     ipratropium (ATROVENT) 0.06 % nasal spray Place 1  spray into both nostrils 3 (three) times daily as needed. As directed     magnesium hydroxide (MILK OF MAGNESIA) 400 MG/5ML suspension Take 30 mLs by mouth daily as needed for mild constipation.     tadalafil (CIALIS) 20 MG tablet Take 1 tablet by mouth daily as needed. As directed     traZODone (DESYREL) 50 MG tablet Take 3 tablets by mouth at bedtime.     ZYPITAMAG 4 MG TABS TAKE 1 TABLET BY MOUTH DAILY. 90 tablet 1   polyethylene glycol (MIRALAX / GLYCOLAX) 17 g packet Take 17 g by mouth daily. (Patient not taking: Reported on 06/27/2021) 14 each 0   No current facility-administered medications for this visit.    Allergies  Allergen Reactions   Flomax [Tamsulosin] Other (See Comments)    Made feel foggy headed    Social History   Socioeconomic History   Marital status: Married    Spouse name: Not on file   Number of children: 0   Years of education: college   Highest education level: Bachelor's degree (e.g., BA, AB, BS)  Occupational History   Occupation: Retired-Human Resources  Tobacco Use   Smoking status: Former    Years: 1.00    Types: Cigarettes    Quit date: 1970    Years since quitting: 52.9   Smokeless tobacco: Never  Vaping Use   Vaping Use: Never used  Substance and Sexual Activity   Alcohol use: No   Drug use: No   Sexual activity: Not on file  Other Topics Concern   Not on file  Social History Narrative   Caffeine: 2 cups daily   Right-handed.   Lives with legal spouse, [name].    Social Determinants of Health   Financial Resource Strain: Not on file  Food Insecurity: Not on file  Transportation Needs: Not on file  Physical Activity: Not on file  Stress: Not on file  Social Connections: Not on file  Intimate Partner Violence: Not on file    Family History  Problem Relation Age of Onset   Heart attack Mother    Other Father        unsure of history   Heart disease Brother    Dementia Sister     Review of Systems:  As stated in the HPI and  otherwise negative.   BP (!) 134/58    Pulse (!) 59    Ht 5\' 8"  (1.727 m)    Wt 189 lb 6.4 oz (85.9 kg)    SpO2 97%    BMI 28.80 kg/m   Physical Examination:  General: Well developed, well nourished, NAD  HEENT: OP clear, mucus membranes moist  SKIN: warm, dry. No rashes. Neuro: No focal deficits  Musculoskeletal: Muscle strength 5/5 all ext  Psychiatric: Mood and affect normal  Neck: No JVD, no carotid bruits,  no thyromegaly, no lymphadenopathy.  Lungs:Clear bilaterally, no wheezes, rhonci, crackles Cardiovascular: Regular rate and rhythm with ectopy. No murmurs, gallops or rubs. Abdomen:Soft. Bowel sounds present. Non-tender.  Extremities: No lower extremity edema. Pulses are 2 + in the bilateral DP/PT.  Echo August 2021:  1. Left ventricular ejection fraction, by estimation, is 50 to 55%. The  left ventricle has low normal function. The left ventricle has no regional  wall motion abnormalities. There is mild concentric left ventricular  hypertrophy. Left ventricular  diastolic parameters are consistent with Grade I diastolic dysfunction  (impaired relaxation).   2. Right ventricular systolic function is low normal. The right  ventricular size is mildly enlarged. There is normal pulmonary artery  systolic pressure. The estimated right ventricular systolic pressure is  51.8 mmHg.   3. Left atrial size was mild to moderately dilated.   4. Right atrial size was mildly dilated.   5. The mitral valve is grossly normal. Mild mitral valve regurgitation.  No evidence of mitral stenosis.   6. 23 mm surgical prosthetic valve present with V max 2.8 m/s, MG 17  mmHG, EOA 1.40 cm2. The aortic valve has been repaired/replaced. Aortic  valve regurgitation is not visualized. No aortic stenosis is present.  There is a 23 mm bioprosthetic valve  present in the aortic position. Procedure Date: 11/10/2018. Echo findings  are consistent with normal structure and function of the aortic valve   prosthesis.   7. The inferior vena cava is normal in size with greater than 50%  respiratory variability, suggesting right atrial pressure of 3 mmHg.  EKG:  EKG is ordered today. The ekg ordered today demonstrates sinus, PACs  Recent Labs: 11/11/2020: ALT 26; BUN 20; Creatinine, Ser 1.03; Hemoglobin 15.4; Platelets 178; Potassium 4.7; Sodium 140; TSH 1.400   Lipid Panel    Component Value Date/Time   CHOL 109 03/06/2020 0158   TRIG 49 03/06/2020 0158   HDL 47 03/06/2020 0158   CHOLHDL 2.3 03/06/2020 0158   VLDL 10 03/06/2020 0158   LDLCALC 52 03/06/2020 0158     Wt Readings from Last 3 Encounters:  06/27/21 189 lb 6.4 oz (85.9 kg)  01/08/21 181 lb 9.6 oz (82.4 kg)  11/11/20 181 lb (82.1 kg)     Assessment and Plan:   1. Aortic stenosis: He is now s/p surgical AVR on 11/10/18. The bioprosthetic AVR is working well by echo August 2021. Continue ASA and SBE prophylaxis as need prior to dental procedures and other indicated surgeries.   2. CAD without angina: Mild CAD on cath pre AVR in 2020. He did not tolerate beta blockers due to fatigue. He has no chest pain. Will continue ASA and statin.   3. Post op atrial fibrillation: No known recurrence since his immediate post-operative period following valve replacement. No indication for anticoagulation. PACs today on EKG  4. Carotid artery disease: CVA in August 2021 and now s/p left CEA. Followed in [facility].   Current medicines are reviewed at length with the patient today.  The patient does not have concerns regarding medicines.  The following changes have been made:  no change  Labs/ tests ordered today include:   Orders Placed This Encounter  Procedures   EKG 12-Lead      Disposition:   F/U with me in 12 months.    Signed, Lauree Chandler, MD 06/27/2021 4:35 PM    [facility] [address], [place], [place]  [address] Phone: [phone]; Fax: [phone]  °

## 2021-06-27 ENCOUNTER — Encounter: Payer: Self-pay | Admitting: Cardiovascular Disease

## 2021-06-27 ENCOUNTER — Other Ambulatory Visit: Payer: Self-pay

## 2021-06-27 ENCOUNTER — Ambulatory Visit: Payer: Medicare HMO | Admitting: Cardiovascular Disease

## 2021-06-27 VITALS — BP 134/58 | HR 59 | Ht 68.0 in | Wt 189.4 lb

## 2021-06-27 DIAGNOSIS — I2510 Atherosclerotic heart disease of native coronary artery without angina pectoris: Secondary | ICD-10-CM

## 2021-06-27 DIAGNOSIS — Z952 Presence of prosthetic heart valve: Secondary | ICD-10-CM

## 2021-06-27 DIAGNOSIS — I6523 Occlusion and stenosis of bilateral carotid arteries: Secondary | ICD-10-CM

## 2021-06-27 NOTE — Patient Instructions (Signed)

## 2021-07-18 ENCOUNTER — Other Ambulatory Visit: Payer: Self-pay | Admitting: Cardiovascular Disease

## 2021-08-05 ENCOUNTER — Other Ambulatory Visit: Payer: Self-pay

## 2021-08-05 ENCOUNTER — Ambulatory Visit: Payer: Medicare HMO | Admitting: Podiatry

## 2021-08-05 ENCOUNTER — Encounter: Payer: Self-pay | Admitting: Podiatry

## 2021-08-05 DIAGNOSIS — B351 Tinea unguium: Secondary | ICD-10-CM

## 2021-08-05 DIAGNOSIS — M79674 Pain in right toe(s): Secondary | ICD-10-CM

## 2021-08-05 DIAGNOSIS — M79675 Pain in left toe(s): Secondary | ICD-10-CM | POA: Diagnosis not present

## 2021-08-05 NOTE — Progress Notes (Signed)
This patient returns to the office for evaluation and treatment of long thick painful  big nails .  This patient is unable to trim his own nails since the patient cannot reach his feet.  Patient says the nails are painful walking and wearing his shoes.  He returns for preventive foot care services.  General Appearance  Alert, conversant and in no acute stress.  Vascular  Dorsalis pedis and posterior tibial  pulses are palpable  bilaterally.  Capillary return is within normal limits  bilaterally. Temperature is within normal limits  bilaterally.  Neurologic  Senn-Weinstein monofilament wire test within normal limits  bilaterally. Muscle power within normal limits bilaterally.  Nails Thick disfigured discolored nails with subungual debris  hallux nails  bilaterally. Hematoma history right hallux nail.No evidence of bacterial infection or drainage bilaterally.  Orthopedic  No limitations of motion  feet .  No crepitus or effusions noted.  No bony pathology or digital deformities noted.  Skin  normotropic skin with no porokeratosis noted bilaterally.  No signs of infections or ulcers noted.     Onychomycosis  Pain in toes right foot  Pain in toes left foot  Debridement  of nails  1-5  B/L with a nail nipper.  Nails were then filed using a dremel tool with no incidents.    RTC 3 months.   Gardiner Barefoot DPM

## 2021-08-08 ENCOUNTER — Observation Stay (HOSPITAL_COMMUNITY)
Admission: EM | Admit: 2021-08-08 | Discharge: 2021-08-09 | Disposition: A | Discharge status: Home / Self Care | Payer: Medicare HMO | Attending: Surgery | Admitting: Surgery

## 2021-08-08 ENCOUNTER — Emergency Department (HOSPITAL_COMMUNITY): Payer: Medicare HMO

## 2021-08-08 ENCOUNTER — Other Ambulatory Visit: Payer: Self-pay

## 2021-08-08 DIAGNOSIS — I672 Cerebral atherosclerosis: Secondary | ICD-10-CM | POA: Diagnosis not present

## 2021-08-08 DIAGNOSIS — R55 Syncope and collapse: Secondary | ICD-10-CM

## 2021-08-08 DIAGNOSIS — Z87891 Personal history of nicotine dependence: Secondary | ICD-10-CM | POA: Insufficient documentation

## 2021-08-08 DIAGNOSIS — S80212A Abrasion, left knee, initial encounter: Secondary | ICD-10-CM | POA: Insufficient documentation

## 2021-08-08 DIAGNOSIS — Z85820 Personal history of malignant melanoma of skin: Secondary | ICD-10-CM | POA: Insufficient documentation

## 2021-08-08 DIAGNOSIS — I10 Essential (primary) hypertension: Secondary | ICD-10-CM | POA: Diagnosis not present

## 2021-08-08 DIAGNOSIS — S2249XA Multiple fractures of ribs, unspecified side, initial encounter for closed fracture: Secondary | ICD-10-CM | POA: Diagnosis present

## 2021-08-08 DIAGNOSIS — S301XXA Contusion of abdominal wall, initial encounter: Secondary | ICD-10-CM | POA: Diagnosis not present

## 2021-08-08 DIAGNOSIS — I2510 Atherosclerotic heart disease of native coronary artery without angina pectoris: Secondary | ICD-10-CM | POA: Diagnosis not present

## 2021-08-08 DIAGNOSIS — S2239XA Fracture of one rib, unspecified side, initial encounter for closed fracture: Secondary | ICD-10-CM | POA: Diagnosis present

## 2021-08-08 DIAGNOSIS — Z7982 Long term (current) use of aspirin: Secondary | ICD-10-CM | POA: Diagnosis not present

## 2021-08-08 DIAGNOSIS — S2243XA Multiple fractures of ribs, bilateral, initial encounter for closed fracture: Secondary | ICD-10-CM | POA: Diagnosis not present

## 2021-08-08 DIAGNOSIS — Z20822 Contact with and (suspected) exposure to covid-19: Secondary | ICD-10-CM | POA: Diagnosis not present

## 2021-08-08 DIAGNOSIS — W19XXXA Unspecified fall, initial encounter: Secondary | ICD-10-CM | POA: Diagnosis not present

## 2021-08-08 DIAGNOSIS — S60512A Abrasion of left hand, initial encounter: Secondary | ICD-10-CM | POA: Insufficient documentation

## 2021-08-08 DIAGNOSIS — S00511A Abrasion of lip, initial encounter: Secondary | ICD-10-CM | POA: Diagnosis not present

## 2021-08-08 DIAGNOSIS — S199XXA Unspecified injury of neck, initial encounter: Secondary | ICD-10-CM | POA: Diagnosis not present

## 2021-08-08 DIAGNOSIS — W010XXA Fall on same level from slipping, tripping and stumbling without subsequent striking against object, initial encounter: Secondary | ICD-10-CM | POA: Insufficient documentation

## 2021-08-08 DIAGNOSIS — S0001XA Abrasion of scalp, initial encounter: Secondary | ICD-10-CM | POA: Insufficient documentation

## 2021-08-08 DIAGNOSIS — M47816 Spondylosis without myelopathy or radiculopathy, lumbar region: Secondary | ICD-10-CM | POA: Diagnosis not present

## 2021-08-08 DIAGNOSIS — K828 Other specified diseases of gallbladder: Secondary | ICD-10-CM | POA: Diagnosis not present

## 2021-08-08 DIAGNOSIS — Z79899 Other long term (current) drug therapy: Secondary | ICD-10-CM | POA: Diagnosis not present

## 2021-08-08 DIAGNOSIS — S299XXA Unspecified injury of thorax, initial encounter: Secondary | ICD-10-CM | POA: Diagnosis present

## 2021-08-08 DIAGNOSIS — S60511A Abrasion of right hand, initial encounter: Secondary | ICD-10-CM | POA: Diagnosis not present

## 2021-08-08 DIAGNOSIS — R29810 Facial weakness: Secondary | ICD-10-CM | POA: Diagnosis not present

## 2021-08-08 DIAGNOSIS — S2241XA Multiple fractures of ribs, right side, initial encounter for closed fracture: Principal | ICD-10-CM | POA: Insufficient documentation

## 2021-08-08 DIAGNOSIS — Y9302 Activity, running: Secondary | ICD-10-CM | POA: Diagnosis not present

## 2021-08-08 DIAGNOSIS — N400 Enlarged prostate without lower urinary tract symptoms: Secondary | ICD-10-CM | POA: Diagnosis not present

## 2021-08-08 DIAGNOSIS — Z952 Presence of prosthetic heart valve: Secondary | ICD-10-CM | POA: Diagnosis not present

## 2021-08-08 DIAGNOSIS — M47812 Spondylosis without myelopathy or radiculopathy, cervical region: Secondary | ICD-10-CM | POA: Diagnosis not present

## 2021-08-08 DIAGNOSIS — S80211A Abrasion, right knee, initial encounter: Secondary | ICD-10-CM | POA: Insufficient documentation

## 2021-08-08 DIAGNOSIS — M419 Scoliosis, unspecified: Secondary | ICD-10-CM | POA: Diagnosis not present

## 2021-08-08 DIAGNOSIS — Z043 Encounter for examination and observation following other accident: Secondary | ICD-10-CM | POA: Diagnosis not present

## 2021-08-08 DIAGNOSIS — M4312 Spondylolisthesis, cervical region: Secondary | ICD-10-CM | POA: Diagnosis not present

## 2021-08-08 DIAGNOSIS — Z8673 Personal history of transient ischemic attack (TIA), and cerebral infarction without residual deficits: Secondary | ICD-10-CM | POA: Diagnosis not present

## 2021-08-08 DIAGNOSIS — I779 Disorder of arteries and arterioles, unspecified: Secondary | ICD-10-CM

## 2021-08-08 DIAGNOSIS — Z23 Encounter for immunization: Secondary | ICD-10-CM | POA: Diagnosis not present

## 2021-08-08 DIAGNOSIS — T1490XA Injury, unspecified, initial encounter: Secondary | ICD-10-CM

## 2021-08-08 DIAGNOSIS — R011 Cardiac murmur, unspecified: Secondary | ICD-10-CM | POA: Insufficient documentation

## 2021-08-08 DIAGNOSIS — S0993XA Unspecified injury of face, initial encounter: Secondary | ICD-10-CM | POA: Diagnosis not present

## 2021-08-08 DIAGNOSIS — S060X9A Concussion with loss of consciousness of unspecified duration, initial encounter: Secondary | ICD-10-CM | POA: Diagnosis not present

## 2021-08-08 DIAGNOSIS — R58 Hemorrhage, not elsewhere classified: Secondary | ICD-10-CM | POA: Diagnosis not present

## 2021-08-08 DIAGNOSIS — R410 Disorientation, unspecified: Secondary | ICD-10-CM | POA: Diagnosis not present

## 2021-08-08 DIAGNOSIS — S3991XA Unspecified injury of abdomen, initial encounter: Secondary | ICD-10-CM | POA: Diagnosis not present

## 2021-08-08 DIAGNOSIS — M4802 Spinal stenosis, cervical region: Secondary | ICD-10-CM | POA: Diagnosis not present

## 2021-08-08 LAB — URINALYSIS, ROUTINE W REFLEX MICROSCOPIC
Bilirubin Urine: NEGATIVE
Glucose, UA: NEGATIVE mg/dL
Ketones, ur: NEGATIVE mg/dL
Leukocytes,Ua: NEGATIVE
Nitrite: NEGATIVE
Protein, ur: NEGATIVE mg/dL
Specific Gravity, Urine: 1.02 (ref 1.005–1.030)
pH: 5 (ref 5.0–8.0)

## 2021-08-08 LAB — CBC
HCT: 42.7 % (ref 39.0–52.0)
HCT: 42.7 % (ref 39.0–52.0)
Hemoglobin: 15.1 g/dL (ref 13.0–17.0)
Hemoglobin: 14.7 g/dL (ref 13.0–17.0)
MCH: 33.8 pg (ref 26.0–34.0)
MCH: 33.1 pg (ref 26.0–34.0)
MCHC: 35.4 g/dL (ref 30.0–36.0)
MCHC: 34.4 g/dL (ref 30.0–36.0)
MCV: 95.5 fL (ref 80.0–100.0)
MCV: 96.2 fL (ref 80.0–100.0)
Platelets: 168 10*3/uL (ref 150–400)
Platelets: 167 10*3/uL (ref 150–400)
RBC: 4.47 MIL/uL (ref 4.22–5.81)
RBC: 4.44 MIL/uL (ref 4.22–5.81)
RDW: 12 % (ref 11.5–15.5)
RDW: 11.9 % (ref 11.5–15.5)
WBC: 7.9 10*3/uL (ref 4.0–10.5)
WBC: 10.6 10*3/uL — ABNORMAL HIGH (ref 4.0–10.5)
nRBC: 0 % (ref 0.0–0.2)
nRBC: 0 % (ref 0.0–0.2)

## 2021-08-08 LAB — COMPREHENSIVE METABOLIC PANEL
ALT: 28 U/L (ref 0–44)
AST: 25 U/L (ref 15–41)
Albumin: 3.8 g/dL (ref 3.5–5.0)
Alkaline Phosphatase: 50 U/L (ref 38–126)
Anion gap: 8 (ref 5–15)
BUN: 31 mg/dL — ABNORMAL HIGH (ref 8–23)
CO2: 27 mmol/L (ref 22–32)
Calcium: 8.9 mg/dL (ref 8.9–10.3)
Chloride: 104 mmol/L (ref 98–111)
Creatinine, Ser: 1.03 mg/dL (ref 0.61–1.24)
GFR, Estimated: >60 mL/min (ref >60)
Glucose, Bld: 97 mg/dL (ref 70–99)
Potassium: 4 mmol/L (ref 3.5–5.1)
Sodium: 139 mmol/L (ref 135–145)
Total Bilirubin: 0.6 mg/dL (ref 0.3–1.2)
Total Protein: 6.6 g/dL (ref 6.5–8.1)

## 2021-08-08 LAB — CREATININE, SERUM
Creatinine, Ser: 1.08 mg/dL (ref 0.61–1.24)
GFR, Estimated: >60 mL/min (ref >60)

## 2021-08-08 LAB — I-STAT CHEM 8, ED
BUN: 34 mg/dL — ABNORMAL HIGH (ref 8–23)
Calcium, Ion: 1.15 mmol/L (ref 1.15–1.40)
Chloride: 103 mmol/L (ref 98–111)
Creatinine, Ser: 1 mg/dL (ref 0.61–1.24)
Glucose, Bld: 94 mg/dL (ref 70–99)
HCT: 44 % (ref 39.0–52.0)
Hemoglobin: 15 g/dL (ref 13.0–17.0)
Potassium: 4.2 mmol/L (ref 3.5–5.1)
Sodium: 140 mmol/L (ref 135–145)
TCO2: 28 mmol/L (ref 22–32)

## 2021-08-08 LAB — RESP PANEL BY RT-PCR (FLU A&B, COVID) ARPGX2
Influenza A by PCR: NEGATIVE
Influenza B by PCR: NEGATIVE
SARS Coronavirus 2 by RT PCR: NEGATIVE

## 2021-08-08 LAB — TROPONIN I (HIGH SENSITIVITY)
Troponin I (High Sensitivity): 17 ng/L (ref <18)
Troponin I (High Sensitivity): 31 ng/L — ABNORMAL HIGH (ref <18)

## 2021-08-08 LAB — ETHANOL: Alcohol, Ethyl (B): <10 mg/dL (ref <10)

## 2021-08-08 LAB — PROTIME-INR
INR: 1 (ref 0.8–1.2)
Prothrombin Time: 13 seconds (ref 11.4–15.2)

## 2021-08-08 LAB — SAMPLE TO BLOOD BANK

## 2021-08-08 LAB — LACTIC ACID, PLASMA: Lactic Acid, Venous: 1.8 mmol/L (ref 0.5–1.9)

## 2021-08-08 MED ORDER — SODIUM CHLORIDE 0.9 % IV BOLUS
1000.0000 mL | Freq: Once | INTRAVENOUS | Status: AC
  Administered 2021-08-08: 1000 mL via INTRAVENOUS

## 2021-08-08 MED ORDER — IOHEXOL 350 MG/ML SOLN
100.0000 mL | Freq: Once | INTRAVENOUS | Status: AC | PRN
Start: 2021-08-08 — End: 2021-08-08
  Administered 2021-08-08: 100 mL via INTRAVENOUS

## 2021-08-08 MED ORDER — CEFAZOLIN SODIUM-DEXTROSE 2-4 GM/100ML-% IV SOLN
2.0000 g | Freq: Once | INTRAVENOUS | Status: AC
  Administered 2021-08-08: 2 g via INTRAVENOUS
  Filled 2021-08-08: qty 100

## 2021-08-08 MED ORDER — ONDANSETRON 4 MG PO TBDP: 4.0000 mg | ORAL_TABLET | Freq: Four times a day (QID) | ORAL | Status: DC | PRN

## 2021-08-08 MED ORDER — ACETAMINOPHEN 325 MG PO TABS
650.0000 mg | ORAL_TABLET | ORAL | Status: DC | PRN
  Administered 2021-08-09: 650 mg via ORAL
  Filled 2021-08-08: qty 2

## 2021-08-08 MED ORDER — MORPHINE SULFATE (PF) 2 MG/ML IV SOLN: 2.0000 mg | INTRAVENOUS | Status: DC | PRN

## 2021-08-08 MED ORDER — OXYCODONE HCL 5 MG PO TABS
5.0000 mg | ORAL_TABLET | ORAL | Status: DC | PRN
  Administered 2021-08-08: 5 mg via ORAL
  Filled 2021-08-08: qty 1

## 2021-08-08 MED ORDER — ONDANSETRON HCL 4 MG/2ML IJ SOLN: 4.0000 mg | Freq: Four times a day (QID) | INTRAMUSCULAR | Status: DC | PRN

## 2021-08-08 MED ORDER — ENOXAPARIN SODIUM 30 MG/0.3ML IJ SOSY: 30.0000 mg | PREFILLED_SYRINGE | Freq: Two times a day (BID) | INTRAMUSCULAR | Status: DC

## 2021-08-08 MED ORDER — TETANUS-DIPHTH-ACELL PERTUSSIS 5-2.5-18.5 LF-MCG/0.5 IM SUSY
0.5000 mL | PREFILLED_SYRINGE | Freq: Once | INTRAMUSCULAR | Status: AC
  Administered 2021-08-08: 0.5 mL via INTRAMUSCULAR
  Filled 2021-08-08: qty 0.5

## 2021-08-08 MED ORDER — FENTANYL CITRATE PF 50 MCG/ML IJ SOSY: 25.0000 ug | PREFILLED_SYRINGE | Freq: Once | INTRAMUSCULAR | Status: DC

## 2021-08-08 NOTE — H&P (Signed)
Activation and Reason: trauma consult, fall  Joshua Hess is an 81 y.o. male.  HPI: 81 yo male was walking in the neighborhood and fell. He was at first unable to move and thinks he lost consciousness. Currently he complains of pain over his mouth. He denies pain in his chest or back.  Past Medical History:  Diagnosis Date   Aphasia    Cancer [facility])    behind ear- [place]   Carotid artery stenosis    Chronotropic incompetence    ED (erectile dysfunction)    Elevated PSA    HOH (hard of hearing)    Mild CAD    Plantar fasciitis    Postoperative atrial fibrillation (HCC)    Severe aortic stenosis    a. s/p tissue AVR 10/2018.   Sinus bradycardia    baseline   Stroke ([place]) 03/05/2020   TIA   Wears glasses     Past Surgical History:  Procedure Laterality Date   AORTIC VALVE REPLACEMENT N/A 11/10/2018   Procedure: AORTIC VALVE REPLACEMENT (AVR), USING 23MM INSPIRIS;  Surgeon: Gaye Pollack, MD;  Location: [place];  Service: Open Heart Surgery;  Laterality: N/A;   ENDARTERECTOMY Left 03/08/2020   Procedure: ENDARTERECTOMY CAROTID;  Surgeon: Rosetta Posner, MD;  Location: [place];  Service: Vascular;  Laterality: Left;   EXTERNAL EAR SURGERY     melanoma removed   HERNIA REPAIR Right    inguinal   INGUINAL HERNIA REPAIR Right 11/15/2019   Procedure: LAPAROSCOPIC RIGHT INGUINAL HERNIA REPAIR WITH MESH;  Surgeon: Coralie Keens, MD;  Location: [place];  Service: General;  Laterality: Right;   RIGHT/LEFT HEART CATH AND CORONARY ANGIOGRAPHY N/A 09/29/2018   Procedure: RIGHT/LEFT HEART CATH AND CORONARY ANGIOGRAPHY;  Surgeon: Burnell Blanks, MD;  Location: [facility];  Service: Cardiovascular;  Laterality: N/A;   TEE WITHOUT CARDIOVERSION N/A 11/10/2018   Procedure: TRANSESOPHAGEAL ECHOCARDIOGRAM (TEE);  Surgeon: Gaye Pollack, MD;  Location: [place];  Service: Open Heart Surgery;  Laterality: N/A;   TRANSCATHETER AORTIC VALVE REPLACEMENT, TRANSFEMORAL     PROCEDURE  ATTEMPTED & ABORTED    UMBILICAL HERNIA REPAIR N/A 11/15/2019   Procedure: UMBILICAL HERNIA REPAIR;  Surgeon: Coralie Keens, MD;  Location: [place];  Service: General;  Laterality: N/A;    Family History  Problem Relation Age of Onset   Heart attack Mother    Other Father        unsure of history   Heart disease Brother    Dementia Sister     Social History:  reports that he quit smoking about 53 years ago. His smoking use included cigarettes. He has never used smokeless tobacco. He reports that he does not drink alcohol and does not use drugs.  Allergies:  Allergies  Allergen Reactions   Flomax [Tamsulosin] Other (See Comments)    Made feel foggy headed    Medications: I have reviewed the patient's current medications.  Results for orders placed or performed during the hospital encounter of 08/08/21 (from the past 48 hour(s))  Resp Panel by RT-PCR (Flu A&B, Covid) Nasopharyngeal Swab     Status: None   Collection Time: 08/08/21  4:48 PM   Specimen: Nasopharyngeal Swab; Nasopharyngeal(NP) swabs in vial transport medium  Result Value Ref Range   SARS Coronavirus 2 by RT PCR NEGATIVE NEGATIVE    Comment: (NOTE) SARS-CoV-2 target nucleic acids are NOT DETECTED.  The SARS-CoV-2 RNA is generally detectable in upper respiratory specimens during the acute  phase of infection. The lowest concentration of SARS-CoV-2 viral copies this assay can detect is 138 copies/mL. A negative result does not preclude SARS-Cov-2 infection and should not be used as the sole basis for treatment or other patient management decisions. A negative result may occur with  improper specimen collection/handling, submission of specimen other than nasopharyngeal swab, presence of viral mutation(s) within the areas targeted by this assay, and inadequate number of viral copies(<138 copies/mL). A negative result must be combined with clinical observations, patient history, and epidemiological information. The  expected result is Negative.  Fact Sheet for Patients:  EntrepreneurPulse.com.au  Fact Sheet for Healthcare Providers:  IncredibleEmployment.be  This test is no t yet approved or cleared by the [place] FDA and  has been authorized for detection and/or diagnosis of SARS-CoV-2 by FDA under an Emergency Use Authorization (EUA). This EUA will remain  in effect (meaning this test can be used) for the duration of the COVID-19 declaration under Section 564(b)(1) of the Act, 21 U.S.C.section 360bbb-3(b)(1), unless the authorization is terminated  or revoked sooner.       Influenza A by PCR NEGATIVE NEGATIVE   Influenza B by PCR NEGATIVE NEGATIVE    Comment: (NOTE) The Xpert Xpress SARS-CoV-2/FLU/RSV plus assay is intended as an aid in the diagnosis of influenza from Nasopharyngeal swab specimens and should not be used as a sole basis for treatment. Nasal washings and aspirates are unacceptable for Xpert Xpress SARS-CoV-2/FLU/RSV testing.  Fact Sheet for Patients: EntrepreneurPulse.com.au  Fact Sheet for Healthcare Providers: IncredibleEmployment.be  This test is not yet approved or cleared by the [place] FDA and has been authorized for detection and/or diagnosis of SARS-CoV-2 by FDA under an Emergency Use Authorization (EUA). This EUA will remain in effect (meaning this test can be used) for the duration of the COVID-19 declaration under Section 564(b)(1) of the Act, 21 U.S.C. section 360bbb-3(b)(1), unless the authorization is terminated or revoked.  Performed at [facility], [place] [address], [place], [place] [address]   Comprehensive metabolic panel     Status: Abnormal   Collection Time: 08/08/21  5:00 PM  Result Value Ref Range   Sodium 139 135 - 145 mmol/L   Potassium 4.0 3.5 - 5.1 mmol/L   Chloride 104 98 - 111 mmol/L   CO2 27 22 - 32 mmol/L   Glucose, Bld 97 70 - 99 mg/dL     Comment: Glucose reference range applies only to samples taken after fasting for at least 8 hours.   BUN 31 (H) 8 - 23 mg/dL   Creatinine, Ser 1.03 0.61 - 1.24 mg/dL   Calcium 8.9 8.9 - 10.3 mg/dL   Total Protein 6.6 6.5 - 8.1 g/dL   Albumin 3.8 3.5 - 5.0 g/dL   AST 25 15 - 41 U/L   ALT 28 0 - 44 U/L   Alkaline Phosphatase 50 38 - 126 U/L   Total Bilirubin 0.6 0.3 - 1.2 mg/dL   GFR, Estimated >60 >60 mL/min    Comment: (NOTE) Calculated using the CKD-EPI Creatinine Equation (2021)    Anion gap 8 5 - 15    Comment: Performed at [facility] [address], [place] [address]  CBC     Status: None   Collection Time: 08/08/21  5:00 PM  Result Value Ref Range   WBC 7.9 4.0 - 10.5 K/uL   RBC 4.47 4.22 - 5.81 MIL/uL   Hemoglobin 15.1 13.0 - 17.0 g/dL   HCT  42.7 39.0 - 52.0 %   MCV 95.5 80.0 - 100.0 fL   MCH 33.8 26.0 - 34.0 pg   MCHC 35.4 30.0 - 36.0 g/dL   RDW 12.0 11.5 - 15.5 %   Platelets 168 150 - 400 K/uL   nRBC 0.0 0.0 - 0.2 %    Comment: Performed at [facility], [place] [address], [place], [place] [address]  Ethanol     Status: None   Collection Time: 08/08/21  5:00 PM  Result Value Ref Range   Alcohol, Ethyl (B) <10 <10 mg/dL    Comment: (NOTE) Lowest detectable limit for serum alcohol is 10 mg/dL.  For medical purposes only. Performed at [facility], [place] [address] [place], [place] [address]   Urinalysis, Routine w reflex microscopic Nasopharyngeal Swab     Status: Abnormal   Collection Time: 08/08/21  5:00 PM  Result Value Ref Range   Color, Urine YELLOW YELLOW   APPearance CLEAR CLEAR   Specific Gravity, Urine 1.020 1.005 - 1.030   pH 5.0 5.0 - 8.0   Glucose, UA NEGATIVE NEGATIVE mg/dL   Hgb urine dipstick MODERATE (A) NEGATIVE   Bilirubin Urine NEGATIVE NEGATIVE   Ketones, ur NEGATIVE NEGATIVE mg/dL   Protein, ur NEGATIVE NEGATIVE mg/dL   Nitrite NEGATIVE NEGATIVE   Leukocytes,Ua NEGATIVE NEGATIVE   RBC / HPF 6-10 0 - 5 RBC/hpf    WBC, UA 0-5 0 - 5 WBC/hpf   Bacteria, UA RARE (A) NONE SEEN   Mucus PRESENT     Comment: Performed at [facility], [place] [address] [place], [place] [address]  Lactic acid, plasma     Status: None   Collection Time: 08/08/21  5:00 PM  Result Value Ref Range   Lactic Acid, Venous 1.8 0.5 - 1.9 mmol/L    Comment: Performed at [facility] [address] [place], [place] [address]  Protime-INR     Status: None   Collection Time: 08/08/21  5:00 PM  Result Value Ref Range   Prothrombin Time 13.0 11.4 - 15.2 seconds   INR 1.0 0.8 - 1.2    Comment: (NOTE) INR goal varies based on device and disease states. Performed at [facility], [place] [address], [place], [place] [address]   Sample to Blood Bank     Status: None   Collection Time: 08/08/21  5:00 PM  Result Value Ref Range   Blood Bank Specimen SAMPLE AVAILABLE FOR TESTING    Sample Expiration      08/09/2021,2359 Performed at [facility], [place] [address], [place], [place] [address]   Troponin I (High Sensitivity)     Status: None   Collection Time: 08/08/21  5:00 PM  Result Value Ref Range   Troponin I (High Sensitivity) 17 <18 ng/L    Comment: (NOTE) Elevated high sensitivity troponin I (hsTnI) values and significant  changes across serial measurements may suggest ACS but many other  chronic and acute conditions are known to elevate hsTnI results.  Refer to the "Links" section for chest pain algorithms and additional  guidance. Performed at [facility], [place] [address], [place], [place] [address]   I-Stat Chem 8, ED     Status: Abnormal   Collection Time: 08/08/21  5:08 PM  Result Value Ref Range   Sodium 140 135 - 145 mmol/L   Potassium 4.2 3.5 - 5.1 mmol/L   Chloride 103 98 - 111 mmol/L  BUN 34 (H) 8 - 23 mg/dL   Creatinine, Ser 1.00 0.61 - 1.24 mg/dL   Glucose, Bld 94 70 - 99 mg/dL    Comment: Glucose reference range applies only to samples taken after fasting for at least 8 hours.    Calcium, Ion 1.15 1.15 - 1.40 mmol/L   TCO2 28 22 - 32 mmol/L   Hemoglobin 15.0 13.0 - 17.0 g/dL   HCT 44.0 39.0 - 52.0 %  Troponin I (High Sensitivity)     Status: Abnormal   Collection Time: 08/08/21  7:11 PM  Result Value Ref Range   Troponin I (High Sensitivity) 31 (H) <18 ng/L    Comment: (NOTE) Elevated high sensitivity troponin I (hsTnI) values and significant  changes across serial measurements may suggest ACS but many other  chronic and acute conditions are known to elevate hsTnI results.  Refer to the "Links" section for chest pain algorithms and additional  guidance. Performed at [facility], [place] [address] [place], [place] [address]     DG Chest 1 View  Result Date: 08/08/2021 CLINICAL DATA:  Fall EXAM: CHEST  1 VIEW COMPARISON:  04/29/2020 FINDINGS: Lungs are clear.  No pleural effusion or pneumothorax. The heart is normal in size.  Prosthetic valve. Median sternotomy. IMPRESSION: Normal chest radiographs. Electronically Signed   By: Julian Hy M.D.   On: 08/08/2021 19:14   DG Pelvis 1-2 Views  Result Date: 08/08/2021 CLINICAL DATA:  Fall EXAM: PELVIS - 1-2 VIEW COMPARISON:  None. FINDINGS: No fracture or dislocation is seen. Excretory contrast in the bladder. Mild degenerative changes of the lower lumbar spine. IMPRESSION: Negative. Electronically Signed   By: Julian Hy M.D.   On: 08/08/2021 19:13   CT HEAD WO CONTRAST  Result Date: 08/08/2021 CLINICAL DATA:  Head trauma, intracranial arterial injury suspected; Neck trauma (Age >= 65y); Facial trauma, blunt EXAM: CT HEAD WITHOUT CONTRAST CT MAXILLOFACIAL WITHOUT CONTRAST CT CERVICAL SPINE WITHOUT CONTRAST TECHNIQUE: Multidetector CT imaging of the head, cervical spine, and maxillofacial structures were performed using the standard protocol without intravenous contrast. Multiplanar CT image reconstructions of the cervical spine and maxillofacial structures were also generated. RADIATION DOSE  REDUCTION: This exam was performed according to the departmental dose-optimization program which includes automated exposure control, adjustment of the mA and/or kV according to patient size and/or use of iterative reconstruction technique. COMPARISON:  None. FINDINGS: CT HEAD FINDINGS BRAIN: BRAIN Patchy and confluent areas of decreased attenuation are noted throughout the deep and periventricular white matter of the cerebral hemispheres bilaterally, compatible with chronic microvascular ischemic disease. No evidence of large-territorial acute infarction. No parenchymal hemorrhage. No mass lesion. No extra-axial collection. No mass effect or midline shift. No hydrocephalus. Basilar cisterns are patent. Vascular: No hyperdense vessel. Atherosclerotic calcifications are present within the cavernous internal carotid and vertebral arteries. Skull: No acute fracture or focal lesion. Other: None. CT MAXILLOFACIAL FINDINGS Osseous: No fracture or mandibular dislocation. No destructive process. Sinuses/Orbits: Paranasal sinuses and mastoid air cells are clear. The orbits are unremarkable. Soft tissues: Negative. CT CERVICAL SPINE FINDINGS Alignment: Mild retrolisthesis of C4 on C5. Skull base and vertebrae: Multilevel degenerative changes of the spine with associated severe osseous neural foraminal stenosis at the left C4-C5 level. No acute fracture. No aggressive appearing focal osseous lesion or focal pathologic process. Soft tissues and spinal canal: No prevertebral fluid or swelling. No visible canal hematoma. Upper chest: Unremarkable. Other: Atherosclerotic plaque. IMPRESSION: 1. No acute intracranial abnormality. 2. No acute displaced facial  fracture. 3. No acute displaced fracture or traumatic listhesis of the cervical spine. Electronically Signed   By: Iven Finn M.D.   On: 08/08/2021 17:56   CT CERVICAL SPINE WO CONTRAST  Result Date: 08/08/2021 CLINICAL DATA:  Head trauma, intracranial arterial injury  suspected; Neck trauma (Age >= 65y); Facial trauma, blunt EXAM: CT HEAD WITHOUT CONTRAST CT MAXILLOFACIAL WITHOUT CONTRAST CT CERVICAL SPINE WITHOUT CONTRAST TECHNIQUE: Multidetector CT imaging of the head, cervical spine, and maxillofacial structures were performed using the standard protocol without intravenous contrast. Multiplanar CT image reconstructions of the cervical spine and maxillofacial structures were also generated. RADIATION DOSE REDUCTION: This exam was performed according to the departmental dose-optimization program which includes automated exposure control, adjustment of the mA and/or kV according to patient size and/or use of iterative reconstruction technique. COMPARISON:  None. FINDINGS: CT HEAD FINDINGS BRAIN: BRAIN Patchy and confluent areas of decreased attenuation are noted throughout the deep and periventricular white matter of the cerebral hemispheres bilaterally, compatible with chronic microvascular ischemic disease. No evidence of large-territorial acute infarction. No parenchymal hemorrhage. No mass lesion. No extra-axial collection. No mass effect or midline shift. No hydrocephalus. Basilar cisterns are patent. Vascular: No hyperdense vessel. Atherosclerotic calcifications are present within the cavernous internal carotid and vertebral arteries. Skull: No acute fracture or focal lesion. Other: None. CT MAXILLOFACIAL FINDINGS Osseous: No fracture or mandibular dislocation. No destructive process. Sinuses/Orbits: Paranasal sinuses and mastoid air cells are clear. The orbits are unremarkable. Soft tissues: Negative. CT CERVICAL SPINE FINDINGS Alignment: Mild retrolisthesis of C4 on C5. Skull base and vertebrae: Multilevel degenerative changes of the spine with associated severe osseous neural foraminal stenosis at the left C4-C5 level. No acute fracture. No aggressive appearing focal osseous lesion or focal pathologic process. Soft tissues and spinal canal: No prevertebral fluid or  swelling. No visible canal hematoma. Upper chest: Unremarkable. Other: Atherosclerotic plaque. IMPRESSION: 1. No acute intracranial abnormality. 2. No acute displaced facial fracture. 3. No acute displaced fracture or traumatic listhesis of the cervical spine. Electronically Signed   By: Iven Finn M.D.   On: 08/08/2021 17:56   CT CHEST ABDOMEN PELVIS W CONTRAST  Result Date: 08/08/2021 CLINICAL DATA:  Poly trauma, blunt. Patient fell while running today. EXAM: CT CHEST, ABDOMEN, AND PELVIS WITH CONTRAST TECHNIQUE: Multidetector CT imaging of the chest, abdomen and pelvis was performed following the standard protocol during bolus administration of intravenous contrast. RADIATION DOSE REDUCTION: This exam was performed according to the departmental dose-optimization program which includes automated exposure control, adjustment of the mA and/or kV according to patient size and/or use of iterative reconstruction technique. CONTRAST:  160mL OMNIPAQUE IOHEXOL 350 MG/ML SOLN COMPARISON:  Chest and pelvic radiographs same date. Abdominopelvic CT 01/19/2020. Chest CTA 11/01/2018. FINDINGS: CT CHEST FINDINGS Cardiovascular: No acute vascular findings are demonstrated. There is diffuse coronary artery atherosclerosis with lesser involvement of the aorta and great vessels. Patient is status post median sternotomy and aortic valve replacement. No evidence of mediastinal hematoma. The heart size is normal. There is no pericardial effusion. Mediastinum/Nodes: There are no enlarged mediastinal, hilar or axillary lymph nodes. The thyroid gland, trachea and esophagus demonstrate no significant findings. Lungs/Pleura: No pleural effusion or pneumothorax. Scattered mild subpleural reticulation in both lungs. No confluent airspace opacity or suspicious pulmonary nodule. Musculoskeletal/Chest wall: There are several acute nondisplaced right-sided rib fractures, involving at least the 5th and 6th ribs, and probably the 7th and  8th ribs as well. No displaced fractures are identified. There is no evidence of  spinal or sternal fracture. The median sternotomy wires are intact. CT ABDOMEN AND PELVIS FINDINGS Hepatobiliary: The liver is normal in density without suspicious focal abnormality. No evidence of acute hepatic injury. The gallbladder is incompletely distended. No evidence of gallstones, gallbladder wall thickening or biliary dilatation. Pancreas: Unremarkable. No pancreatic ductal dilatation or surrounding inflammatory changes. Spleen: Normal in size without evidence of acute injury or focal abnormality. Adrenals/Urinary Tract: Both adrenal glands appear normal. No evidence of acute renal abnormality. There is mildly increased density within the renal collecting systems bilaterally on the early images, likely due to early contrast excretion. No evidence of urinary tract calculus or hydronephrosis. The bladder appears unremarkable for its degree of distention. Stomach/Bowel: No enteric contrast administered. The stomach appears unremarkable for its degree of distension. No evidence of bowel wall thickening, distention or surrounding inflammatory change. The appendix appears normal. Moderate stool throughout the colon. Vascular/Lymphatic: There are no enlarged abdominal or pelvic lymph nodes. Aortic and branch vessel atherosclerosis without acute vascular findings or aneurysm. Reproductive: Moderate enlargement of the prostate gland, similar to previous study. Other: No ascites, free air or focal extraluminal fluid collection. Musculoskeletal: No acute or significant osseous findings. No evidence of acute fracture. There are degenerative changes in the lumbar spine associated with a convex left scoliosis. IMPRESSION: 1. There are several acute nondisplaced anterior right-sided rib fractures. No evidence of pleural effusion or pneumothorax. 2. No other acute findings are identified in the chest, abdomen or pelvis. 3. Coronary and Aortic  Atherosclerosis (ICD10-I70.0). Previous CABG and aortic valve replacement. 4. Moderate prostatomegaly. 5. Lumbar spondylosis. Electronically Signed   By: Richardean Sale M.D.   On: 08/08/2021 18:05   DG Knee Complete 4 Views Left  Result Date: 08/08/2021 CLINICAL DATA:  Fall EXAM: LEFT KNEE - COMPLETE 4+ VIEW COMPARISON:  None. FINDINGS: No fracture or dislocation is seen. Very mild degenerative changes with suspected medial compartment chondrocalcinosis intraoperative of the tibial spines. The visualized soft tissues are unremarkable. No suprapatellar knee joint effusion. IMPRESSION: Negative. Electronically Signed   By: Julian Hy M.D.   On: 08/08/2021 19:12   DG Knee Complete 4 Views Right  Result Date: 08/08/2021 CLINICAL DATA:  Fall EXAM: RIGHT KNEE - COMPLETE 4+ VIEW COMPARISON:  None. FINDINGS: No fracture or dislocation is seen. The joint spaces are preserved. The visualized soft tissues are unremarkable. No suprapatellar knee joint effusion. IMPRESSION: Negative. Electronically Signed   By: Julian Hy M.D.   On: 08/08/2021 19:12   DG Hand Complete Left  Result Date: 08/08/2021 CLINICAL DATA:  [place], abrasions, pain EXAM: LEFT HAND - COMPLETE 3+ VIEW COMPARISON:  None. FINDINGS: Frontal, oblique, and lateral views of the left hand are obtained. Pulse oximeter limits evaluation of the distal aspect of the fourth digit. No acute fracture, subluxation, or dislocation. Mild joint space narrowing throughout the distal interphalangeal joints and radiocarpal joint. Soft tissues are unremarkable. IMPRESSION: 1. Mild osteoarthritis. 2. No acute displaced fracture. Electronically Signed   By: Randa Ngo M.D.   On: 08/08/2021 19:12   DG Hand Complete Right  Result Date: 08/08/2021 CLINICAL DATA:  Fall EXAM: RIGHT HAND - COMPLETE 3+ VIEW COMPARISON:  None. FINDINGS: No fracture or dislocation is seen. The joint spaces are preserved. The visualized soft tissues are unremarkable.  IMPRESSION: Negative. Electronically Signed   By: Julian Hy M.D.   On: 08/08/2021 19:11   CT MAXILLOFACIAL WO CONTRAST  Result Date: 08/08/2021 CLINICAL DATA:  Head trauma, intracranial arterial injury suspected; Neck trauma (  Age >= 65y); Facial trauma, blunt EXAM: CT HEAD WITHOUT CONTRAST CT MAXILLOFACIAL WITHOUT CONTRAST CT CERVICAL SPINE WITHOUT CONTRAST TECHNIQUE: Multidetector CT imaging of the head, cervical spine, and maxillofacial structures were performed using the standard protocol without intravenous contrast. Multiplanar CT image reconstructions of the cervical spine and maxillofacial structures were also generated. RADIATION DOSE REDUCTION: This exam was performed according to the departmental dose-optimization program which includes automated exposure control, adjustment of the mA and/or kV according to patient size and/or use of iterative reconstruction technique. COMPARISON:  None. FINDINGS: CT HEAD FINDINGS BRAIN: BRAIN Patchy and confluent areas of decreased attenuation are noted throughout the deep and periventricular white matter of the cerebral hemispheres bilaterally, compatible with chronic microvascular ischemic disease. No evidence of large-territorial acute infarction. No parenchymal hemorrhage. No mass lesion. No extra-axial collection. No mass effect or midline shift. No hydrocephalus. Basilar cisterns are patent. Vascular: No hyperdense vessel. Atherosclerotic calcifications are present within the cavernous internal carotid and vertebral arteries. Skull: No acute fracture or focal lesion. Other: None. CT MAXILLOFACIAL FINDINGS Osseous: No fracture or mandibular dislocation. No destructive process. Sinuses/Orbits: Paranasal sinuses and mastoid air cells are clear. The orbits are unremarkable. Soft tissues: Negative. CT CERVICAL SPINE FINDINGS Alignment: Mild retrolisthesis of C4 on C5. Skull base and vertebrae: Multilevel degenerative changes of the spine with associated  severe osseous neural foraminal stenosis at the left C4-C5 level. No acute fracture. No aggressive appearing focal osseous lesion or focal pathologic process. Soft tissues and spinal canal: No prevertebral fluid or swelling. No visible canal hematoma. Upper chest: Unremarkable. Other: Atherosclerotic plaque. IMPRESSION: 1. No acute intracranial abnormality. 2. No acute displaced facial fracture. 3. No acute displaced fracture or traumatic listhesis of the cervical spine. Electronically Signed   By: Iven Finn M.D.   On: 08/08/2021 17:56    ROS  PE Blood pressure (!) 171/93, pulse 70, temperature 98.8 F (37.1 C), temperature source Oral, resp. rate 16, height 5\' 8"  (1.727 m), weight 79.4 kg, SpO2 98 %. Constitutional: NAD; conversant; abrasions over face Eyes: Moist conjunctiva; no lid lag; anicteric; PERRL Neck: Trachea midline; no thyromegaly, no cervicalgia Lungs: Normal respiratory effort; no tactile fremitus CV: RRR; no palpable thrills; no pitting edema GI: Abd soft, NT; no palpable hepatosplenomegaly MSK: unable to assess gait; no clubbing/cyanosis Psychiatric: Appropriate affect; alert and oriented x3, possible mild confusion Lymphatic: No palpable cervical or axillary lymphadenopathy   Assessment/Plan: 81 yo male who fell  Concussion/h/o TIA - continue to assess and reorient, medicine consults for syncope workup Rib fractures - minimal pain currently, continue respiratory exercise and pain control  FEN- cardiac diet VTE- lovenox ID- no issues Dispo- admit to hospital   Procedures: none  I reviewed last 24 h vitals and pain scores, last 48 h intake and output, last 24 h labs and trends, and last 24 h imaging results.  This care required high  level of medical decision making.   Arta Bruce Wylodean Shimmel 08/08/2021, 8:24 PM

## 2021-08-08 NOTE — ED Notes (Addendum)
Pt resting on stretcher with eyes closed, respirations even and unlabored. Pt requested for lights to be dimmed, stretcher in position of comfort. Urinal and call bell within reach. Pt instructed to call and not get up without assistance. Pt given food options but is vegetarian and was only able to eat 1 cup of applesauce due to the swelling in his upper lip. Warm compress applied to left arm. MD aware. No acute changes noted. Will continue to monitor.

## 2021-08-08 NOTE — Progress Notes (Addendum)
Trauma Event Note   TRN rounded on patient. Pt stable at this time, VS WDL. Provided IS with teach-back, 1250 with me. CAGE AID, no current alcohol or drug use. Pending admission orders/bed assignment. Trauma MD at bedside. Ambulated to bathroom with standby assist x1 staff member without difficulty. Frequent toileting urgency with little urinary output, no stool per primary nurse Josh.  Last imported Vital Signs BP (!) 171/93    Pulse 70    Temp 98.8 F (37.1 C) (Oral)    Resp 16    Ht 5\' 8"  (1.727 m)    Wt 175 lb (79.4 kg)    SpO2 98%    BMI 26.61 kg/m   Trending CBC Recent Labs    08/08/21 1700 08/08/21 1708  WBC 7.9  --   HGB 15.1 15.0  HCT 42.7 44.0  PLT 168  --     Trending Coag's Recent Labs    08/08/21 1700  INR 1.0    Trending BMET Recent Labs    08/08/21 1700 08/08/21 1708  NA 139 140  K 4.0 4.2  CL 104 103  CO2 27  --   BUN 31* 34*  CREATININE 1.03 1.00  GLUCOSE 97 94      Kenneth Cuaresma O Nareg Breighner  Trauma Response RN  Please call TRN at [phone] for further assistance.

## 2021-08-08 NOTE — ED Notes (Signed)
Pt assisted back to stretcher, only urine produced while on the [facility]. No bowel movement. Pt requested for temp in room to be decreased. Temp decreased, side rails up x2, call bell within reach, bedside table close by with urinal and incentive spirometer within reach. No acute changes noted. Will continue to monitor. Warm compress applied to left arm.

## 2021-08-08 NOTE — Consult Note (Signed)
Consult note  Joshua Hess TML:[id number] DOB: Oct 06, 1940 DOA: 08/08/2021  PCP: [name], MD   Requesting provider: Trauma MD  Chief Complaint: Fall  HPI: Joshua Hess is a 81 y.o. male with medical history significant of bradycardia, aortic stenosis status post aortic valve replacement, CAD, carotid artery disease status post endarterectomy, essential tremor, BPH, depression, anxiety, cognitive impairment, TIA presenting after fall.  Patient fell while running today.  He states that he used to run marathons.  Ports he is more forgetful recently and does not remember all the events of the fall.  States that he knows he was out running and thinks he tripped over something and fell forward.  Event was witnessed by bystanders and EMS was called and patient was subsequently transported to the ED.  As above patient cannot fully recall the event and is unsure if he truly tripped over something or had a syncopal episode.   Denies fevers, chills, chest pain or shortness breath, abdominal pain, constipation, diarrhea, nausea, vomiting.   ED Course: Vital signs in the ED significant for blood pressure in the 681E 751 systolic.  Lab work-up included CMP which significant only for BUN of 31.  CBC within normal limits.  PT and INR within normal limits.  Lactic acid normal.  Troponin normal.  Respiratory panel flu and COVID-negative.  Ethanol level negative.  Urinalysis with only moderate hemoglobin and rare bacteria.  CT of the head, maxillofacial, C-spine all without acute abnormality.  CT of the chest abdomen pelvis showed several rib fractures without evidence of pleural effusion and no other acute abnormalities.  Patient received fentanyl, Ancef, a liter of fluids in the ED.  Trauma service was consulted and will admit the patient requesting medicine service to consult for assistance with syncope work-up.  Review of Systems: As per HPI otherwise all other systems reviewed and are negative.  Past  Medical History:  Diagnosis Date   Aphasia    Cancer [facility])    behind ear- [place]   Carotid artery stenosis    Chronotropic incompetence    ED (erectile dysfunction)    Elevated PSA    HOH (hard of hearing)    Mild CAD    Plantar fasciitis    Postoperative atrial fibrillation (HCC)    Severe aortic stenosis    a. s/p tissue AVR 10/2018.   Sinus bradycardia    baseline   Stroke ([place]) 03/05/2020   TIA   Wears glasses     Past Surgical History:  Procedure Laterality Date   AORTIC VALVE REPLACEMENT N/A 11/10/2018   Procedure: AORTIC VALVE REPLACEMENT (AVR), USING 23MM INSPIRIS;  Surgeon: Gaye Pollack, MD;  Location: [place];  Service: Open Heart Surgery;  Laterality: N/A;   ENDARTERECTOMY Left 03/08/2020   Procedure: ENDARTERECTOMY CAROTID;  Surgeon: Rosetta Posner, MD;  Location: [place];  Service: Vascular;  Laterality: Left;   EXTERNAL EAR SURGERY     melanoma removed   HERNIA REPAIR Right    inguinal   INGUINAL HERNIA REPAIR Right 11/15/2019   Procedure: LAPAROSCOPIC RIGHT INGUINAL HERNIA REPAIR WITH MESH;  Surgeon: Coralie Keens, MD;  Location: [place];  Service: General;  Laterality: Right;   RIGHT/LEFT HEART CATH AND CORONARY ANGIOGRAPHY N/A 09/29/2018   Procedure: RIGHT/LEFT HEART CATH AND CORONARY ANGIOGRAPHY;  Surgeon: Burnell Blanks, MD;  Location: [place] [facility];  Service: Cardiovascular;  Laterality: N/A;   TEE WITHOUT CARDIOVERSION N/A 11/10/2018   Procedure: TRANSESOPHAGEAL ECHOCARDIOGRAM (TEE);  Surgeon: Gilford Raid  K, MD;  Location: [place];  Service: Open Heart Surgery;  Laterality: N/A;   TRANSCATHETER AORTIC VALVE REPLACEMENT, TRANSFEMORAL     PROCEDURE ATTEMPTED & ABORTED    UMBILICAL HERNIA REPAIR N/A 11/15/2019   Procedure: UMBILICAL HERNIA REPAIR;  Surgeon: Coralie Keens, MD;  Location: [place];  Service: General;  Laterality: N/A;    Social History  reports that he quit smoking about 53 years ago. His smoking use included cigarettes. He has  never used smokeless tobacco. He reports that he does not drink alcohol and does not use drugs.  Allergies  Allergen Reactions   Flomax [Tamsulosin] Other (See Comments)    Made feel foggy headed    Family History  Problem Relation Age of Onset   Heart attack Mother    Other Father        unsure of history   Heart disease Brother    Dementia Sister   Reviewed on admission/consultation  Prior to Admission medications   Medication Sig Start Date End Date Taking? Authorizing Provider  ALPRAZolam Duanne Moron) 0.5 MG tablet Take 1 tablet by mouth as needed (as directed). 10/23/20   [provider]  amoxicillin (AMOXIL) 500 MG capsule TAKE 4 CAPSULES BY MOUTH 1-2 HOURS PRIOR TO DENTAL APPOINTMENT 07/18/21   Burnell Blanks, MD  Apoaequorin 10 MG CAPS Take 10 mg by mouth daily.    [provider]  aspirin EC 81 MG EC tablet Take 1 tablet (81 mg total) by mouth daily. Swallow whole. 03/10/20   Sheikh, Omair Latif, DO  escitalopram (LEXAPRO) 20 MG tablet Take 1 tablet by mouth daily. 03/22/21   [provider]  fluoruracil (CARAC) 0.5 % cream as directed.    [provider]  ipratropium (ATROVENT) 0.06 % nasal spray Place 1 spray into both nostrils 3 (three) times daily as needed. As directed 05/16/21   [provider]  magnesium hydroxide (MILK OF MAGNESIA) 400 MG/5ML suspension Take 30 mLs by mouth daily as needed for mild constipation.    [provider]  polyethylene glycol (MIRALAX / GLYCOLAX) 17 g packet Take 17 g by mouth daily. Patient not taking: Reported on 06/27/2021 11/22/19   Kayleen Memos, DO  tadalafil (CIALIS) 20 MG tablet Take 1 tablet by mouth daily as needed. As directed 03/13/21   [provider]  traZODone (DESYREL) 50 MG tablet Take 3 tablets by mouth at bedtime. 05/28/21   [provider]  ZYPITAMAG 4 MG TABS TAKE 1 TABLET BY MOUTH DAILY. 04/14/21   Gregor Hams, MD    Physical Exam: Vitals:    08/08/21 1638 08/08/21 1639 08/08/21 1715 08/08/21 1830  BP:  (!) 161/80 (!) 151/73 (!) 171/93  Pulse:  75 61 70  Resp:  20 18 16   Temp:  98.8 F (37.1 C)    TempSrc:  Oral    SpO2:  98% 95% 98%  Weight:  79.4 kg    Height: 5\' 8"  (1.727 m)      Physical Exam Constitutional:      General: He is not in acute distress.    Appearance: Normal appearance.  HENT:     Head: Normocephalic and atraumatic.     Mouth/Throat:     Mouth: Mucous membranes are moist.     Pharynx: Oropharynx is clear.  Eyes:     Extraocular Movements: Extraocular movements intact.     Pupils: Pupils are equal, round, and reactive to light.  Cardiovascular:     Rate and  Rhythm: Normal rate and regular rhythm.     Pulses: Normal pulses.     Heart sounds: Murmur heard.  Pulmonary:     Effort: Pulmonary effort is normal. No respiratory distress.     Breath sounds: Normal breath sounds.  Abdominal:     General: Bowel sounds are normal. There is no distension.     Palpations: Abdomen is soft.     Tenderness: There is no abdominal tenderness.  Musculoskeletal:        General: No swelling or deformity.  Skin:    General: Skin is warm and dry.     Comments: Multiple bruises and skin tears from his fall.  Located legs, knees, face, hands  Neurological:     General: No focal deficit present.     Mental Status: Mental status is at baseline.   Labs on Admission: I have personally reviewed following labs and imaging studies  CBC: Recent Labs  Lab 08/08/21 1700 08/08/21 1708  WBC 7.9  --   HGB 15.1 15.0  HCT 42.7 44.0  MCV 95.5  --   PLT 168  --     Basic Metabolic Panel: Recent Labs  Lab 08/08/21 1700 08/08/21 1708  NA 139 140  K 4.0 4.2  CL 104 103  CO2 27  --   GLUCOSE 97 94  BUN 31* 34*  CREATININE 1.03 1.00  CALCIUM 8.9  --     GFR: Estimated Creatinine Clearance: 57 mL/min (by C-G formula based on SCr of 1 mg/dL).  Liver Function Tests: Recent Labs  Lab 08/08/21 1700  AST 25  ALT  28  ALKPHOS 50  BILITOT 0.6  PROT 6.6  ALBUMIN 3.8    Urine analysis:    Component Value Date/Time   COLORURINE YELLOW 08/08/2021 1700   APPEARANCEUR CLEAR 08/08/2021 1700   LABSPEC 1.020 08/08/2021 1700   PHURINE 5.0 08/08/2021 1700   GLUCOSEU NEGATIVE 08/08/2021 1700   HGBUR MODERATE (A) 08/08/2021 1700   BILIRUBINUR NEGATIVE 08/08/2021 1700   KETONESUR NEGATIVE 08/08/2021 1700   PROTEINUR NEGATIVE 08/08/2021 1700   NITRITE NEGATIVE 08/08/2021 1700   LEUKOCYTESUR NEGATIVE 08/08/2021 1700    Radiological Exams on Admission: CT HEAD WO CONTRAST  Result Date: 08/08/2021 CLINICAL DATA:  Head trauma, intracranial arterial injury suspected; Neck trauma (Age >= 65y); Facial trauma, blunt EXAM: CT HEAD WITHOUT CONTRAST CT MAXILLOFACIAL WITHOUT CONTRAST CT CERVICAL SPINE WITHOUT CONTRAST TECHNIQUE: Multidetector CT imaging of the head, cervical spine, and maxillofacial structures were performed using the standard protocol without intravenous contrast. Multiplanar CT image reconstructions of the cervical spine and maxillofacial structures were also generated. RADIATION DOSE REDUCTION: This exam was performed according to the departmental dose-optimization program which includes automated exposure control, adjustment of the mA and/or kV according to patient size and/or use of iterative reconstruction technique. COMPARISON:  None. FINDINGS: CT HEAD FINDINGS BRAIN: BRAIN Patchy and confluent areas of decreased attenuation are noted throughout the deep and periventricular white matter of the cerebral hemispheres bilaterally, compatible with chronic microvascular ischemic disease. No evidence of large-territorial acute infarction. No parenchymal hemorrhage. No mass lesion. No extra-axial collection. No mass effect or midline shift. No hydrocephalus. Basilar cisterns are patent. Vascular: No hyperdense vessel. Atherosclerotic calcifications are present within the cavernous internal carotid and vertebral  arteries. Skull: No acute fracture or focal lesion. Other: None. CT MAXILLOFACIAL FINDINGS Osseous: No fracture or mandibular dislocation. No destructive process. Sinuses/Orbits: Paranasal sinuses and mastoid air cells are clear. The orbits are unremarkable. Soft tissues: Negative.  CT CERVICAL SPINE FINDINGS Alignment: Mild retrolisthesis of C4 on C5. Skull base and vertebrae: Multilevel degenerative changes of the spine with associated severe osseous neural foraminal stenosis at the left C4-C5 level. No acute fracture. No aggressive appearing focal osseous lesion or focal pathologic process. Soft tissues and spinal canal: No prevertebral fluid or swelling. No visible canal hematoma. Upper chest: Unremarkable. Other: Atherosclerotic plaque. IMPRESSION: 1. No acute intracranial abnormality. 2. No acute displaced facial fracture. 3. No acute displaced fracture or traumatic listhesis of the cervical spine. Electronically Signed   By: Iven Finn M.D.   On: 08/08/2021 17:56   CT CERVICAL SPINE WO CONTRAST  Result Date: 08/08/2021 CLINICAL DATA:  Head trauma, intracranial arterial injury suspected; Neck trauma (Age >= 65y); Facial trauma, blunt EXAM: CT HEAD WITHOUT CONTRAST CT MAXILLOFACIAL WITHOUT CONTRAST CT CERVICAL SPINE WITHOUT CONTRAST TECHNIQUE: Multidetector CT imaging of the head, cervical spine, and maxillofacial structures were performed using the standard protocol without intravenous contrast. Multiplanar CT image reconstructions of the cervical spine and maxillofacial structures were also generated. RADIATION DOSE REDUCTION: This exam was performed according to the departmental dose-optimization program which includes automated exposure control, adjustment of the mA and/or kV according to patient size and/or use of iterative reconstruction technique. COMPARISON:  None. FINDINGS: CT HEAD FINDINGS BRAIN: BRAIN Patchy and confluent areas of decreased attenuation are noted throughout the deep and  periventricular white matter of the cerebral hemispheres bilaterally, compatible with chronic microvascular ischemic disease. No evidence of large-territorial acute infarction. No parenchymal hemorrhage. No mass lesion. No extra-axial collection. No mass effect or midline shift. No hydrocephalus. Basilar cisterns are patent. Vascular: No hyperdense vessel. Atherosclerotic calcifications are present within the cavernous internal carotid and vertebral arteries. Skull: No acute fracture or focal lesion. Other: None. CT MAXILLOFACIAL FINDINGS Osseous: No fracture or mandibular dislocation. No destructive process. Sinuses/Orbits: Paranasal sinuses and mastoid air cells are clear. The orbits are unremarkable. Soft tissues: Negative. CT CERVICAL SPINE FINDINGS Alignment: Mild retrolisthesis of C4 on C5. Skull base and vertebrae: Multilevel degenerative changes of the spine with associated severe osseous neural foraminal stenosis at the left C4-C5 level. No acute fracture. No aggressive appearing focal osseous lesion or focal pathologic process. Soft tissues and spinal canal: No prevertebral fluid or swelling. No visible canal hematoma. Upper chest: Unremarkable. Other: Atherosclerotic plaque. IMPRESSION: 1. No acute intracranial abnormality. 2. No acute displaced facial fracture. 3. No acute displaced fracture or traumatic listhesis of the cervical spine. Electronically Signed   By: Iven Finn M.D.   On: 08/08/2021 17:56   CT CHEST ABDOMEN PELVIS W CONTRAST  Result Date: 08/08/2021 CLINICAL DATA:  Poly trauma, blunt. Patient fell while running today. EXAM: CT CHEST, ABDOMEN, AND PELVIS WITH CONTRAST TECHNIQUE: Multidetector CT imaging of the chest, abdomen and pelvis was performed following the standard protocol during bolus administration of intravenous contrast. RADIATION DOSE REDUCTION: This exam was performed according to the departmental dose-optimization program which includes automated exposure control,  adjustment of the mA and/or kV according to patient size and/or use of iterative reconstruction technique. CONTRAST:  162mL OMNIPAQUE IOHEXOL 350 MG/ML SOLN COMPARISON:  Chest and pelvic radiographs same date. Abdominopelvic CT 01/19/2020. Chest CTA 11/01/2018. FINDINGS: CT CHEST FINDINGS Cardiovascular: No acute vascular findings are demonstrated. There is diffuse coronary artery atherosclerosis with lesser involvement of the aorta and great vessels. Patient is status post median sternotomy and aortic valve replacement. No evidence of mediastinal hematoma. The heart size is normal. There is no pericardial effusion. Mediastinum/Nodes: There are  no enlarged mediastinal, hilar or axillary lymph nodes. The thyroid gland, trachea and esophagus demonstrate no significant findings. Lungs/Pleura: No pleural effusion or pneumothorax. Scattered mild subpleural reticulation in both lungs. No confluent airspace opacity or suspicious pulmonary nodule. Musculoskeletal/Chest wall: There are several acute nondisplaced right-sided rib fractures, involving at least the 5th and 6th ribs, and probably the 7th and 8th ribs as well. No displaced fractures are identified. There is no evidence of spinal or sternal fracture. The median sternotomy wires are intact. CT ABDOMEN AND PELVIS FINDINGS Hepatobiliary: The liver is normal in density without suspicious focal abnormality. No evidence of acute hepatic injury. The gallbladder is incompletely distended. No evidence of gallstones, gallbladder wall thickening or biliary dilatation. Pancreas: Unremarkable. No pancreatic ductal dilatation or surrounding inflammatory changes. Spleen: Normal in size without evidence of acute injury or focal abnormality. Adrenals/Urinary Tract: Both adrenal glands appear normal. No evidence of acute renal abnormality. There is mildly increased density within the renal collecting systems bilaterally on the early images, likely due to early contrast excretion. No  evidence of urinary tract calculus or hydronephrosis. The bladder appears unremarkable for its degree of distention. Stomach/Bowel: No enteric contrast administered. The stomach appears unremarkable for its degree of distension. No evidence of bowel wall thickening, distention or surrounding inflammatory change. The appendix appears normal. Moderate stool throughout the colon. Vascular/Lymphatic: There are no enlarged abdominal or pelvic lymph nodes. Aortic and branch vessel atherosclerosis without acute vascular findings or aneurysm. Reproductive: Moderate enlargement of the prostate gland, similar to previous study. Other: No ascites, free air or focal extraluminal fluid collection. Musculoskeletal: No acute or significant osseous findings. No evidence of acute fracture. There are degenerative changes in the lumbar spine associated with a convex left scoliosis. IMPRESSION: 1. There are several acute nondisplaced anterior right-sided rib fractures. No evidence of pleural effusion or pneumothorax. 2. No other acute findings are identified in the chest, abdomen or pelvis. 3. Coronary and Aortic Atherosclerosis (ICD10-I70.0). Previous CABG and aortic valve replacement. 4. Moderate prostatomegaly. 5. Lumbar spondylosis. Electronically Signed   By: Richardean Sale M.D.   On: 08/08/2021 18:05   CT MAXILLOFACIAL WO CONTRAST  Result Date: 08/08/2021 CLINICAL DATA:  Head trauma, intracranial arterial injury suspected; Neck trauma (Age >= 65y); Facial trauma, blunt EXAM: CT HEAD WITHOUT CONTRAST CT MAXILLOFACIAL WITHOUT CONTRAST CT CERVICAL SPINE WITHOUT CONTRAST TECHNIQUE: Multidetector CT imaging of the head, cervical spine, and maxillofacial structures were performed using the standard protocol without intravenous contrast. Multiplanar CT image reconstructions of the cervical spine and maxillofacial structures were also generated. RADIATION DOSE REDUCTION: This exam was performed according to the departmental  dose-optimization program which includes automated exposure control, adjustment of the mA and/or kV according to patient size and/or use of iterative reconstruction technique. COMPARISON:  None. FINDINGS: CT HEAD FINDINGS BRAIN: BRAIN Patchy and confluent areas of decreased attenuation are noted throughout the deep and periventricular white matter of the cerebral hemispheres bilaterally, compatible with chronic microvascular ischemic disease. No evidence of large-territorial acute infarction. No parenchymal hemorrhage. No mass lesion. No extra-axial collection. No mass effect or midline shift. No hydrocephalus. Basilar cisterns are patent. Vascular: No hyperdense vessel. Atherosclerotic calcifications are present within the cavernous internal carotid and vertebral arteries. Skull: No acute fracture or focal lesion. Other: None. CT MAXILLOFACIAL FINDINGS Osseous: No fracture or mandibular dislocation. No destructive process. Sinuses/Orbits: Paranasal sinuses and mastoid air cells are clear. The orbits are unremarkable. Soft tissues: Negative. CT CERVICAL SPINE FINDINGS Alignment: Mild retrolisthesis of C4 on C5.  Skull base and vertebrae: Multilevel degenerative changes of the spine with associated severe osseous neural foraminal stenosis at the left C4-C5 level. No acute fracture. No aggressive appearing focal osseous lesion or focal pathologic process. Soft tissues and spinal canal: No prevertebral fluid or swelling. No visible canal hematoma. Upper chest: Unremarkable. Other: Atherosclerotic plaque. IMPRESSION: 1. No acute intracranial abnormality. 2. No acute displaced facial fracture. 3. No acute displaced fracture or traumatic listhesis of the cervical spine. Electronically Signed   By: Iven Finn M.D.   On: 08/08/2021 17:56    EKG: Independently reviewed.  Sinus arrhythmia at 65 bpm.  Assessment/Plan Active Problems:   S/P AVR (aortic valve replacement)   Mild CAD   Carotid artery disease ([place])    Syncope  Fall ?Syncope History of carotid artery disease status post endarterectomy History of aortic stenosis status post aortic valve replacement > Patient presenting with fall today as per HPI.  He has trouble remembering the exact details of the event and is unclear if he simply tripped and fell or had a syncopal event.  Does have history of aortic valve replacement due to aortic stenosis and does currently have murmur on exam. [facility] service consulted for assistance with syncope work-up. - Monitor on telemetry - Echocardiogram - Orthostatic vital signs - Carotid ultrasound  Rib fractures > Patient being admitted by trauma service after fall where is unclear as above whether he simply tripped while running or had an episode of syncope. - Rib fracture management per trauma service  CAD History of TIA > Mild per chart review - Recommend continue daily aspirin as no significant bleeding on trauma imaging.  Cognitive impairment > History of this in chart.  Patient having difficulty remembering event around his fall today. - Noted  Appreciate this consult, Hospitalist service will continue to follow along with you  Marcelyn Bruins MD [facility]  How to contact the [facility] Attending or Consulting provider [place] or covering provider during after hours [place], for this patient?   Check the care team in [facility] and look for a) attending/consulting [facility] provider listed and b) the [facility] team listed Log into [email or web address] and use [place]'s universal password to access. If you do not have the password, please contact the hospital operator. Locate the [facility] provider you are looking for under [facility] and page to a number that you can be directly reached. If you still have difficulty reaching the provider, please page the [facility] (Director on Call) for the Hospitalists listed on amion for assistance.  08/08/2021, 7:02 PM

## 2021-08-08 NOTE — TOC CAGE-AID Note (Signed)
Transition of Care [facility]) - CAGE-AID Screening   Patient Details  Name: Joshua Hess MRN: [id number] Date of Birth: Aug 30, 1940  Transition of Care [facility]) CM/SW Contact:    Army Melia, RN Phone [phone] 08/08/2021, 8:14 PM   Clinical Narrative:  Presents to hospital after a fall while running, questionable LOC. Denies alcohol and drug use, no resources needed at this time.  CAGE-AID Screening:    Have You Ever Felt You Ought to Cut Down on Your Drinking or Drug Use?: No Have People Annoyed You By Critizing Your Drinking Or Drug Use?: No Have You Felt Bad Or Guilty About Your Drinking Or Drug Use?: No Have You Ever Had a Drink or Used Drugs First Thing In The Morning to Steady Your Nerves or to Get Rid of a Hangover?: No CAGE-AID Score: 0  Substance Abuse Education Offered: No

## 2021-08-08 NOTE — ED Notes (Signed)
Jenetta Loges [phone] would like to be called when patient is discharged and he will come pick him up to take him home

## 2021-08-08 NOTE — ED Provider Notes (Signed)
[facility] EMERGENCY DEPARTMENT Provider Note   CSN: [id number] Arrival date & time: 08/08/21  1631     History  Chief Complaint  Patient presents with   Fall   Abrasion   Wrist Pain    Joshua Hess is a 81 y.o. male hx of CAD, aortic stenosis s/p AVR, mitral refurg, here presenting with fall.  Patient states that he runs marathons previously.  He states that he has been more forgetful recently.  He was running today and apparently tripped over something and fell face forward.  This was witnessed by the bystanders and EMS was called and he was brought to the ED.  Patient could not fully recall the incident and keeps on repeating that he fell.  Patient is not on blood thinners.  Patient was noted to have abrasions on his hands and bilateral knees.  Patient cannot remember his last tetanus shot  The history is provided by the patient.      Home Medications Prior to Admission medications   Medication Sig Start Date End Date Taking? Authorizing Provider  ALPRAZolam Duanne Moron) 0.5 MG tablet Take 1 tablet by mouth as needed (as directed). 10/23/20   [provider]  amoxicillin (AMOXIL) 500 MG capsule TAKE 4 CAPSULES BY MOUTH 1-2 HOURS PRIOR TO DENTAL APPOINTMENT 07/18/21   Burnell Blanks, MD  Apoaequorin 10 MG CAPS Take 10 mg by mouth daily.    [provider]  aspirin EC 81 MG EC tablet Take 1 tablet (81 mg total) by mouth daily. Swallow whole. 03/10/20   Sheikh, Omair Latif, DO  escitalopram (LEXAPRO) 20 MG tablet Take 1 tablet by mouth daily. 03/22/21   [provider]  fluoruracil (CARAC) 0.5 % cream as directed.    [provider]  ipratropium (ATROVENT) 0.06 % nasal spray Place 1 spray into both nostrils 3 (three) times daily as needed. As directed 05/16/21   [provider]  magnesium hydroxide (MILK OF MAGNESIA) 400 MG/5ML suspension Take 30 mLs by mouth daily as needed for mild constipation.    [provider]  polyethylene glycol (MIRALAX / GLYCOLAX) 17 g packet Take 17 g by mouth daily. Patient not taking: Reported on 06/27/2021 11/22/19   Kayleen Memos, DO  tadalafil (CIALIS) 20 MG tablet Take 1 tablet by mouth daily as needed. As directed 03/13/21   [provider]  traZODone (DESYREL) 50 MG tablet Take 3 tablets by mouth at bedtime. 05/28/21   [provider]  ZYPITAMAG 4 MG TABS TAKE 1 TABLET BY MOUTH DAILY. 04/14/21   Gregor Hams, MD      Allergies    Flomax [tamsulosin]    Review of Systems   Review of Systems  Skin:  Positive for wound.  Neurological:  Positive for headaches.  All other systems reviewed and are negative.  Physical Exam Updated Vital Signs BP (!) 151/73    Pulse 61    Temp 98.8 F (37.1 C) (Oral)    Resp 18    Ht 5\' 8"  (1.727 m)    Wt 79.4 kg    SpO2 95%    BMI 26.61 kg/m  Physical Exam Vitals and nursing note reviewed.  Constitutional:      Comments: Slightly confused  HENT:     Head: Normocephalic.     Comments: Abrasion on the frontal scalp    Mouth/Throat:     Comments: Patient has upper lip abrasion.  Patient is missing multiple teeth but  that appears to be a chronic issue.  No obvious active bleeding in the mouth Eyes:     Extraocular Movements: Extraocular movements intact.     Pupils: Pupils are equal, round, and reactive to light.  Cardiovascular:     Rate and Rhythm: Normal rate and regular rhythm.     Comments: Patient has a holosystolic murmur loudest at the left upper sternal base. Pulmonary:     Effort: Pulmonary effort is normal.     Breath sounds: Normal breath sounds.  Abdominal:     General: Abdomen is flat.     Palpations: Abdomen is soft.     Comments: Bruising on the lower abdomen but no obvious tenderness  Musculoskeletal:     Cervical back: Normal range of motion and neck supple.     Comments: Patient has road rash on bilateral hands.  Patient also has a rash on bilateral knees as well.  No midline spinal  tenderness.  No obvious extremity trauma  Skin:    General: Skin is warm.     Capillary Refill: Capillary refill takes less than 2 seconds.  Neurological:     General: No focal deficit present.     Mental Status: He is oriented to person, place, and time.  Psychiatric:        Mood and Affect: Mood normal.        Behavior: Behavior normal.    ED Results / Procedures / Treatments   Labs (all labs ordered are listed, but only abnormal results are displayed) Labs Reviewed  COMPREHENSIVE METABOLIC PANEL - Abnormal; Notable for the following components:      Result Value   BUN 31 (*)    All other components within normal limits  URINALYSIS, ROUTINE W REFLEX MICROSCOPIC - Abnormal; Notable for the following components:   Hgb urine dipstick MODERATE (*)    Bacteria, UA RARE (*)    All other components within normal limits  I-STAT CHEM 8, ED - Abnormal; Notable for the following components:   BUN 34 (*)    All other components within normal limits  RESP PANEL BY RT-PCR (FLU A&B, COVID) ARPGX2  CBC  ETHANOL  LACTIC ACID, PLASMA  PROTIME-INR  SAMPLE TO BLOOD BANK  TROPONIN I (HIGH SENSITIVITY)  TROPONIN I (HIGH SENSITIVITY)    EKG EKG Interpretation  Date/Time:  Friday August 08 2021 16:43:35 EST Ventricular Rate:  65 PR Interval:  211 QRS Duration: 98 QT Interval:  405 QTC Calculation: 422 R Axis:   35 Text Interpretation: Sinus arrhythmia Consider anterior infarct No significant change since last tracing Confirmed by Wandra Arthurs [phone]) on 08/08/2021 4:46:48 PM  Radiology CT HEAD WO CONTRAST  Result Date: 08/08/2021 CLINICAL DATA:  Head trauma, intracranial arterial injury suspected; Neck trauma (Age >= 65y); Facial trauma, blunt EXAM: CT HEAD WITHOUT CONTRAST CT MAXILLOFACIAL WITHOUT CONTRAST CT CERVICAL SPINE WITHOUT CONTRAST TECHNIQUE: Multidetector CT imaging of the head, cervical spine, and maxillofacial structures were performed using the standard protocol without  intravenous contrast. Multiplanar CT image reconstructions of the cervical spine and maxillofacial structures were also generated. RADIATION DOSE REDUCTION: This exam was performed according to the departmental dose-optimization program which includes automated exposure control, adjustment of the mA and/or kV according to patient size and/or use of iterative reconstruction technique. COMPARISON:  None. FINDINGS: CT HEAD FINDINGS BRAIN: BRAIN Patchy and confluent areas of decreased attenuation are noted throughout the deep and periventricular white matter of the cerebral hemispheres bilaterally, compatible with chronic microvascular ischemic  disease. No evidence of large-territorial acute infarction. No parenchymal hemorrhage. No mass lesion. No extra-axial collection. No mass effect or midline shift. No hydrocephalus. Basilar cisterns are patent. Vascular: No hyperdense vessel. Atherosclerotic calcifications are present within the cavernous internal carotid and vertebral arteries. Skull: No acute fracture or focal lesion. Other: None. CT MAXILLOFACIAL FINDINGS Osseous: No fracture or mandibular dislocation. No destructive process. Sinuses/Orbits: Paranasal sinuses and mastoid air cells are clear. The orbits are unremarkable. Soft tissues: Negative. CT CERVICAL SPINE FINDINGS Alignment: Mild retrolisthesis of C4 on C5. Skull base and vertebrae: Multilevel degenerative changes of the spine with associated severe osseous neural foraminal stenosis at the left C4-C5 level. No acute fracture. No aggressive appearing focal osseous lesion or focal pathologic process. Soft tissues and spinal canal: No prevertebral fluid or swelling. No visible canal hematoma. Upper chest: Unremarkable. Other: Atherosclerotic plaque. IMPRESSION: 1. No acute intracranial abnormality. 2. No acute displaced facial fracture. 3. No acute displaced fracture or traumatic listhesis of the cervical spine. Electronically Signed   By: Iven Finn  M.D.   On: 08/08/2021 17:56   CT CERVICAL SPINE WO CONTRAST  Result Date: 08/08/2021 CLINICAL DATA:  Head trauma, intracranial arterial injury suspected; Neck trauma (Age >= 65y); Facial trauma, blunt EXAM: CT HEAD WITHOUT CONTRAST CT MAXILLOFACIAL WITHOUT CONTRAST CT CERVICAL SPINE WITHOUT CONTRAST TECHNIQUE: Multidetector CT imaging of the head, cervical spine, and maxillofacial structures were performed using the standard protocol without intravenous contrast. Multiplanar CT image reconstructions of the cervical spine and maxillofacial structures were also generated. RADIATION DOSE REDUCTION: This exam was performed according to the departmental dose-optimization program which includes automated exposure control, adjustment of the mA and/or kV according to patient size and/or use of iterative reconstruction technique. COMPARISON:  None. FINDINGS: CT HEAD FINDINGS BRAIN: BRAIN Patchy and confluent areas of decreased attenuation are noted throughout the deep and periventricular white matter of the cerebral hemispheres bilaterally, compatible with chronic microvascular ischemic disease. No evidence of large-territorial acute infarction. No parenchymal hemorrhage. No mass lesion. No extra-axial collection. No mass effect or midline shift. No hydrocephalus. Basilar cisterns are patent. Vascular: No hyperdense vessel. Atherosclerotic calcifications are present within the cavernous internal carotid and vertebral arteries. Skull: No acute fracture or focal lesion. Other: None. CT MAXILLOFACIAL FINDINGS Osseous: No fracture or mandibular dislocation. No destructive process. Sinuses/Orbits: Paranasal sinuses and mastoid air cells are clear. The orbits are unremarkable. Soft tissues: Negative. CT CERVICAL SPINE FINDINGS Alignment: Mild retrolisthesis of C4 on C5. Skull base and vertebrae: Multilevel degenerative changes of the spine with associated severe osseous neural foraminal stenosis at the left C4-C5 level. No  acute fracture. No aggressive appearing focal osseous lesion or focal pathologic process. Soft tissues and spinal canal: No prevertebral fluid or swelling. No visible canal hematoma. Upper chest: Unremarkable. Other: Atherosclerotic plaque. IMPRESSION: 1. No acute intracranial abnormality. 2. No acute displaced facial fracture. 3. No acute displaced fracture or traumatic listhesis of the cervical spine. Electronically Signed   By: Iven Finn M.D.   On: 08/08/2021 17:56   CT CHEST ABDOMEN PELVIS W CONTRAST  Result Date: 08/08/2021 CLINICAL DATA:  Poly trauma, blunt. Patient fell while running today. EXAM: CT CHEST, ABDOMEN, AND PELVIS WITH CONTRAST TECHNIQUE: Multidetector CT imaging of the chest, abdomen and pelvis was performed following the standard protocol during bolus administration of intravenous contrast. RADIATION DOSE REDUCTION: This exam was performed according to the departmental dose-optimization program which includes automated exposure control, adjustment of the mA and/or kV according to patient size and/or  use of iterative reconstruction technique. CONTRAST:  150mL OMNIPAQUE IOHEXOL 350 MG/ML SOLN COMPARISON:  Chest and pelvic radiographs same date. Abdominopelvic CT 01/19/2020. Chest CTA 11/01/2018. FINDINGS: CT CHEST FINDINGS Cardiovascular: No acute vascular findings are demonstrated. There is diffuse coronary artery atherosclerosis with lesser involvement of the aorta and great vessels. Patient is status post median sternotomy and aortic valve replacement. No evidence of mediastinal hematoma. The heart size is normal. There is no pericardial effusion. Mediastinum/Nodes: There are no enlarged mediastinal, hilar or axillary lymph nodes. The thyroid gland, trachea and esophagus demonstrate no significant findings. Lungs/Pleura: No pleural effusion or pneumothorax. Scattered mild subpleural reticulation in both lungs. No confluent airspace opacity or suspicious pulmonary nodule.  Musculoskeletal/Chest wall: There are several acute nondisplaced right-sided rib fractures, involving at least the 5th and 6th ribs, and probably the 7th and 8th ribs as well. No displaced fractures are identified. There is no evidence of spinal or sternal fracture. The median sternotomy wires are intact. CT ABDOMEN AND PELVIS FINDINGS Hepatobiliary: The liver is normal in density without suspicious focal abnormality. No evidence of acute hepatic injury. The gallbladder is incompletely distended. No evidence of gallstones, gallbladder wall thickening or biliary dilatation. Pancreas: Unremarkable. No pancreatic ductal dilatation or surrounding inflammatory changes. Spleen: Normal in size without evidence of acute injury or focal abnormality. Adrenals/Urinary Tract: Both adrenal glands appear normal. No evidence of acute renal abnormality. There is mildly increased density within the renal collecting systems bilaterally on the early images, likely due to early contrast excretion. No evidence of urinary tract calculus or hydronephrosis. The bladder appears unremarkable for its degree of distention. Stomach/Bowel: No enteric contrast administered. The stomach appears unremarkable for its degree of distension. No evidence of bowel wall thickening, distention or surrounding inflammatory change. The appendix appears normal. Moderate stool throughout the colon. Vascular/Lymphatic: There are no enlarged abdominal or pelvic lymph nodes. Aortic and branch vessel atherosclerosis without acute vascular findings or aneurysm. Reproductive: Moderate enlargement of the prostate gland, similar to previous study. Other: No ascites, free air or focal extraluminal fluid collection. Musculoskeletal: No acute or significant osseous findings. No evidence of acute fracture. There are degenerative changes in the lumbar spine associated with a convex left scoliosis. IMPRESSION: 1. There are several acute nondisplaced anterior right-sided rib  fractures. No evidence of pleural effusion or pneumothorax. 2. No other acute findings are identified in the chest, abdomen or pelvis. 3. Coronary and Aortic Atherosclerosis (ICD10-I70.0). Previous CABG and aortic valve replacement. 4. Moderate prostatomegaly. 5. Lumbar spondylosis. Electronically Signed   By: Richardean Sale M.D.   On: 08/08/2021 18:05   CT MAXILLOFACIAL WO CONTRAST  Result Date: 08/08/2021 CLINICAL DATA:  Head trauma, intracranial arterial injury suspected; Neck trauma (Age >= 65y); Facial trauma, blunt EXAM: CT HEAD WITHOUT CONTRAST CT MAXILLOFACIAL WITHOUT CONTRAST CT CERVICAL SPINE WITHOUT CONTRAST TECHNIQUE: Multidetector CT imaging of the head, cervical spine, and maxillofacial structures were performed using the standard protocol without intravenous contrast. Multiplanar CT image reconstructions of the cervical spine and maxillofacial structures were also generated. RADIATION DOSE REDUCTION: This exam was performed according to the departmental dose-optimization program which includes automated exposure control, adjustment of the mA and/or kV according to patient size and/or use of iterative reconstruction technique. COMPARISON:  None. FINDINGS: CT HEAD FINDINGS BRAIN: BRAIN Patchy and confluent areas of decreased attenuation are noted throughout the deep and periventricular white matter of the cerebral hemispheres bilaterally, compatible with chronic microvascular ischemic disease. No evidence of large-territorial acute infarction. No parenchymal hemorrhage. No  mass lesion. No extra-axial collection. No mass effect or midline shift. No hydrocephalus. Basilar cisterns are patent. Vascular: No hyperdense vessel. Atherosclerotic calcifications are present within the cavernous internal carotid and vertebral arteries. Skull: No acute fracture or focal lesion. Other: None. CT MAXILLOFACIAL FINDINGS Osseous: No fracture or mandibular dislocation. No destructive process. Sinuses/Orbits:  Paranasal sinuses and mastoid air cells are clear. The orbits are unremarkable. Soft tissues: Negative. CT CERVICAL SPINE FINDINGS Alignment: Mild retrolisthesis of C4 on C5. Skull base and vertebrae: Multilevel degenerative changes of the spine with associated severe osseous neural foraminal stenosis at the left C4-C5 level. No acute fracture. No aggressive appearing focal osseous lesion or focal pathologic process. Soft tissues and spinal canal: No prevertebral fluid or swelling. No visible canal hematoma. Upper chest: Unremarkable. Other: Atherosclerotic plaque. IMPRESSION: 1. No acute intracranial abnormality. 2. No acute displaced facial fracture. 3. No acute displaced fracture or traumatic listhesis of the cervical spine. Electronically Signed   By: Iven Finn M.D.   On: 08/08/2021 17:56    Procedures Procedures    CRITICAL CARE Performed by: Wandra Arthurs   Total critical care time: 30 minutes  Critical care time was exclusive of separately billable procedures and treating other patients.  Critical care was necessary to treat or prevent imminent or life-threatening deterioration.  Critical care was time spent personally by me on the following activities: development of treatment plan with patient and/or surrogate as well as nursing, discussions with consultants, evaluation of patient's response to treatment, examination of patient, obtaining history from patient or surrogate, ordering and performing treatments and interventions, ordering and review of laboratory studies, ordering and review of radiographic studies, pulse oximetry and re-evaluation of patient's condition.   Medications Ordered in ED Medications  fentaNYL (SUBLIMAZE) injection 25 mcg (has no administration in time range)  ceFAZolin (ANCEF) IVPB 2g/100 mL premix (2 g Intravenous New Bag/Given 08/08/21 1714)  Tdap (BOOSTRIX) injection 0.5 mL (0.5 mLs Intramuscular Given 08/08/21 1714)  sodium chloride 0.9 % bolus 1,000 mL  (1,000 mLs Intravenous New Bag/Given 08/08/21 1709)  iohexol (OMNIPAQUE) 350 MG/ML injection 100 mL (100 mLs Intravenous Contrast Given 08/08/21 1730)    ED Course/ Medical Decision Making/ A&P                           Medical Decision Making XAVIAN HARDCASTLE is a 81 y.o. male here presenting with fall.  Patient has been forgetful recently.  Patient apparently had a mechanical fall and fell face forward.  However he did not quite remember the incident and not sure if he syncopized or not.  Patient does have a holosystolic murmur and has a previous aortic valve replacement for aortic stenosis.  I wonder if he has recurrent aortic stenosis and actually syncopized from that.  Plan to get CBC and CMP and troponin and will do a trauma scan with CT head and cervical spine and chest abdomen pelvis.  We will also get extremity x-rays.  We will also update tetanus and give Ancef   6:37 PM CT showed notable rib fractures.  No hemothorax or pneumothorax.  Since patient likely had a syncopal episode and has a heart murmur, patient will need to be admitted for syncope work-up.  I discussed case with Dr. Kieth Brightly from trauma.  He states that trauma can admit and he does request medicine service to consult regarding syncope work-up  Problems Addressed: Closed fracture of multiple ribs of right side, initial  encounter: acute illness or injury Heart murmur: chronic illness or injury Syncope, unspecified syncope type: acute illness or injury Trauma: acute illness or injury  Amount and/or Complexity of Data Reviewed External Data Reviewed: notes. Labs: ordered. Decision-making details documented in ED Course. Radiology: ordered and independent interpretation performed. Decision-making details documented in ED Course. ECG/medicine tests: ordered and independent interpretation performed. Decision-making details documented in ED Course.  Risk Prescription drug management.   Final Clinical Impression(s) / ED  Diagnoses Final diagnoses:  Trauma  Syncope, unspecified syncope type  Closed fracture of multiple ribs of right side, initial encounter  Heart murmur    Rx / DC Orders ED Discharge Orders     None         Drenda Freeze, MD 08/08/21 [phone]

## 2021-08-08 NOTE — ED Notes (Signed)
Pt brought to room 39 by Merrily Pew, RN. Pt resting on stretcher with swollen upper lip and multiple abrasions to face. Pt denies any complaints. Pt alert to person and place, disoriented to time, forgets situation and why he is here, asking repetitive questions. Pt noted to have abrasions to hands and bilateral knees, bleeding controlled to all areas. Noted that there is swelling in left arm. Pt had CT scan earlier today but no report was provided regarding an IV Infiltration. Called and spoke with CT, who states pt had infiltration of contrast, small amount to left arm and it was reported to a male nurse around 1730 when it occurred. A new PIV was started by CT. Pt's left arm is swollen, tight to touch, no redness or weeping noted, no open areas, swollen from left wrist to middle of bicep. Pt denies any pain to the area. Dr. Kieth Brightly paged and notified of situation, MD came by and evaluated arm, states to put warm compresses on area and monitor. Pt has good pulse motor sensation distally. No acute changes noted. Will continue to monitor.

## 2021-08-08 NOTE — ED Notes (Signed)
Pt assisted up to [facility] to have bowel movement. Pt needed only assistance to stand and pivot to the [facility]. No acute changes noted. Will continue to monitor. Warm compress removed.

## 2021-08-08 NOTE — ED Triage Notes (Addendum)
Pt arrives via EMS for mechanical trip and fall while running today. Pt presents with abrasions to upper lip, face, right wrist, left arm, and bilat knees. Pt c/o bilateral 5/10 wrist pain. Pt reports that he believes he is missing some teeth and reports having implants placed a few weeks ago. Pt takes a baby asprin. Hx of worsening periodic confusion over the past several months. Pt has neurology appointment coming in the next month for confusion.

## 2021-08-08 NOTE — ED Notes (Addendum)
Pt assisted to in-room toilet with 2-person assist. Pt is A&O but somewhat confused with questionable judgment related to his ability to move freely without assistance. He has felt the need to have a BM with multiple attempts and no success thus far.  He complains of a little tenderness to touch when palpating his abdomen. He only voided a little on his last attempt for BM.

## 2021-08-09 ENCOUNTER — Inpatient Hospital Stay (HOSPITAL_BASED_OUTPATIENT_CLINIC_OR_DEPARTMENT_OTHER): Payer: Medicare HMO

## 2021-08-09 ENCOUNTER — Observation Stay (HOSPITAL_COMMUNITY): Payer: Medicare HMO

## 2021-08-09 DIAGNOSIS — R55 Syncope and collapse: Secondary | ICD-10-CM

## 2021-08-09 DIAGNOSIS — S2239XA Fracture of one rib, unspecified side, initial encounter for closed fracture: Secondary | ICD-10-CM | POA: Diagnosis present

## 2021-08-09 DIAGNOSIS — S2249XA Multiple fractures of ribs, unspecified side, initial encounter for closed fracture: Secondary | ICD-10-CM | POA: Diagnosis not present

## 2021-08-09 DIAGNOSIS — Z952 Presence of prosthetic heart valve: Secondary | ICD-10-CM | POA: Diagnosis not present

## 2021-08-09 DIAGNOSIS — Z8673 Personal history of transient ischemic attack (TIA), and cerebral infarction without residual deficits: Secondary | ICD-10-CM | POA: Diagnosis not present

## 2021-08-09 DIAGNOSIS — S060X9A Concussion with loss of consciousness of unspecified duration, initial encounter: Secondary | ICD-10-CM | POA: Diagnosis not present

## 2021-08-09 DIAGNOSIS — S2241XA Multiple fractures of ribs, right side, initial encounter for closed fracture: Secondary | ICD-10-CM | POA: Diagnosis not present

## 2021-08-09 LAB — CBC
HCT: 40 % (ref 39.0–52.0)
Hemoglobin: 13.8 g/dL (ref 13.0–17.0)
MCH: 33.2 pg (ref 26.0–34.0)
MCHC: 34.5 g/dL (ref 30.0–36.0)
MCV: 96.2 fL (ref 80.0–100.0)
Platelets: 159 10*3/uL (ref 150–400)
RBC: 4.16 MIL/uL — ABNORMAL LOW (ref 4.22–5.81)
RDW: 12.1 % (ref 11.5–15.5)
WBC: 9.2 10*3/uL (ref 4.0–10.5)
nRBC: 0 % (ref 0.0–0.2)

## 2021-08-09 LAB — BASIC METABOLIC PANEL
Anion gap: 6 (ref 5–15)
BUN: 24 mg/dL — ABNORMAL HIGH (ref 8–23)
CO2: 25 mmol/L (ref 22–32)
Calcium: 8.4 mg/dL — ABNORMAL LOW (ref 8.9–10.3)
Chloride: 107 mmol/L (ref 98–111)
Creatinine, Ser: 0.95 mg/dL (ref 0.61–1.24)
GFR, Estimated: >60 mL/min (ref >60)
Glucose, Bld: 108 mg/dL — ABNORMAL HIGH (ref 70–99)
Potassium: 3.9 mmol/L (ref 3.5–5.1)
Sodium: 138 mmol/L (ref 135–145)

## 2021-08-09 LAB — ECHOCARDIOGRAM COMPLETE
AR max vel: 1.08 cm2
AV Area VTI: 1.24 cm2
AV Area mean vel: 1.09 cm2
AV Mean grad: 17 mmHg
AV Peak grad: 32.5 mmHg
Ao pk vel: 2.85 m/s
Area-P 1/2: 3.27 cm2
Height: 68 in
S' Lateral: 4 cm
Weight: 2800 oz

## 2021-08-09 MED ORDER — ALPRAZOLAM 0.5 MG PO TABS
0.5000 mg | ORAL_TABLET | Freq: Every evening | ORAL | Status: DC | PRN
  Administered 2021-08-09: 0.5 mg via ORAL
  Filled 2021-08-09: qty 2

## 2021-08-09 MED ORDER — OXYCODONE HCL 5 MG PO TABS: 5.0000 mg | ORAL_TABLET | Freq: Four times a day (QID) | ORAL | 0 refills | Status: DC | PRN

## 2021-08-09 NOTE — Care Management Obs Status (Signed)
[place] NOTIFICATION   Patient Details  Name: Joshua Hess MRN: [id number] Date of Birth: January 29, 1941   Medicare Observation Status Notification Given:  Yes    Bartholomew Crews, RN 08/09/2021, 10:49 AM

## 2021-08-09 NOTE — ED Notes (Signed)
Called to give report due to no response over message sent and report given over the phone. No acute changes noted. Will continue to monitor.

## 2021-08-09 NOTE — Evaluation (Addendum)
Physical Therapy Evaluation Patient Details Name: Joshua Hess MRN: [id number] DOB: [birth year] Today's Date: 08/09/2021  History of Present Illness  81 year old gentleman admitted 1/27 after recently developed cognitive impairment and fell while running and unsure how he fell.  Positive for rib fractures on the right. PMH:  aortic stenosis status post aortic valve replacement, coronary artery disease status post endarterectomy, essential tremors, depression and anxiety  Clinical Impression  Pt admitted with above diagnosis. Pt was able to ambulate with assist for balance as pt is unsteady and has decr safety awareness. Son present and understands need for 24 hour care at home. Son is taking FMLA to care for pt. Son has been trying to get pt in with a Neurologist but has been waiting since Oct for a March appt as pt has had a steady decline in balance for months now with several falls.   Son educated on how to guard pt and issued gait belt. REcommend Outpt PT f/u to address balance.  Will follow acutely. Pt currently with functional limitations due to the deficits listed below (see PT Problem List). Pt will benefit from skilled PT to increase their independence and safety with mobility to allow discharge to the venue listed below.          Recommendations for follow up therapy are one component of a multi-disciplinary discharge planning process, led by the attending physician.  Recommendations may be updated based on patient status, additional functional criteria and insurance authorization.  Follow Up Recommendations Outpatient PT    Assistance Recommended at Discharge Frequent or constant Supervision/Assistance  Patient can return home with the following  A little help with walking and/or transfers;A little help with bathing/dressing/bathroom;Help with stairs or ramp for entrance    Equipment Recommendations None recommended by PT  Recommendations for Other Services       Functional Status  Assessment Patient has had a recent decline in their functional status and demonstrates the ability to make significant improvements in function in a reasonable and predictable amount of time.     Precautions / Restrictions Precautions Precautions: Fall Restrictions Weight Bearing Restrictions: No      Mobility  Bed Mobility Overal bed mobility: Independent             General bed mobility comments: Needed incr time to come to EOB.  No physical assist needed.    Transfers Overall transfer level: Needs assistance Equipment used: 2 person hand held assist Transfers: Sit to/from Stand Sit to Stand: Min assist, +2 safety/equipment           General transfer comment: Pt reaching for son and therapist to stand to his feet. Pt unsteady and needed at least 1 UE support for balance Staggers all directions but left >right.  Needed at least 1 UE support at all times and did better with 2 UE support.    Ambulation/Gait Ambulation/Gait assistance: Min assist, +2 safety/equipment Gait Distance (Feet): 150 Feet Assistive device: 1 person hand held assist, 2 person hand held assist Gait Pattern/deviations: Step-through pattern, Decreased stride length, Staggering left, Staggering right, Ataxic   Gait velocity interpretation: <1.8 ft/sec, indicate of risk for recurrent falls   General Gait Details: Pt needed support for balance as pt unsteady at times losing balance left > right and needed support to maintain balance. Pt not safe to walk without support currently and is impulsive with decr safety awareness. son present and does understand taht pt will need 24 hour care and is taking off work.Issued  gait belt to the pt for son to use as well.  Stairs            Wheelchair Mobility    Modified Rankin (Stroke Patients Only)       Balance Overall balance assessment: Needs assistance Sitting-balance support: No upper extremity supported, Feet supported Sitting balance-Leahy  Scale: Fair     Standing balance support: No upper extremity supported, During functional activity, Single extremity supported, Bilateral upper extremity supported Standing balance-Leahy Scale: Poor Standing balance comment: relies on at least 1 UE support for balance.  Unsteady on feet                             Pertinent Vitals/Pain Pain Assessment Pain Assessment: Faces Faces Pain Scale: Hurts little more Pain Location: multiple abrasions Pain Descriptors / Indicators: Discomfort, Grimacing, Guarding Pain Intervention(s): Limited activity within patient's tolerance, Monitored during session, Repositioned    Home Living Family/patient expects to be discharged to:: Private residence Living Arrangements: Children (son plans to take [facility]) Available Help at Discharge: Family;Available 24 hours/day Type of Home: House Home Access: Stairs to enter Entrance Stairs-Rails: Right;Left;Can reach both Entrance Stairs-Number of Steps: 3   Home Layout: Two level;Able to live on main level with bedroom/bathroom Home Equipment: Cane - quad;Rollator (4 wheels);Grab bars - tub/shower;Shower seat (walking stick) Additional Comments: Per son, pt not supposed to use stair for second level but fell recently trying to get up to that level.    Prior Function Prior Level of Function : Independent/Modified Independent;History of Falls (last six months)             Mobility Comments: Son reports pt likes to walk with walking stick that does have rubber grip on it.  Most of his falls are outside on unlevel terrain and that he has noted balance issues since Oct and has had appt with Neurologist for MArch.       Hand Dominance   Dominant Hand: Right    Extremity/Trunk Assessment   Upper Extremity Assessment Upper Extremity Assessment: Defer to OT evaluation    Lower Extremity Assessment Lower Extremity Assessment: Generalized weakness    Cervical / Trunk Assessment Cervical /  Trunk Assessment: Normal  Communication   Communication: No difficulties  Cognition Arousal/Alertness: Awake/alert Behavior During Therapy: Anxious, Impulsive Overall Cognitive Status: Impaired/Different from baseline Area of Impairment: Following commands, Safety/judgement, Awareness, Problem solving, Memory                     Memory: Decreased short-term memory Following Commands: Follows one step commands consistently Safety/Judgement: Decreased awareness of safety, Decreased awareness of deficits   Problem Solving: Slow processing, Difficulty sequencing, Requires verbal cues, Decreased initiation          General Comments General comments (skin integrity, edema, etc.): Son present    Exercises     Assessment/Plan    PT Assessment Patient needs continued PT services  PT Problem List Decreased activity tolerance;Decreased balance;Decreased mobility;Decreased knowledge of use of DME;Decreased safety awareness;Decreased knowledge of precautions;Decreased cognition       PT Treatment Interventions DME instruction;Gait training;Functional mobility training;Therapeutic activities;Therapeutic exercise;Balance training;Patient/family education;Stair training    PT Goals (Current goals can be found in the Care Plan section)  Acute Rehab PT Goals Patient Stated Goal: to go home with son PT Goal Formulation: With patient Time For Goal Achievement: 08/23/21 Potential to Achieve Goals: Good    Frequency Min 5X/week  Co-evaluation               AM-PAC PT "6 Clicks" Mobility  Outcome Measure Help needed turning from your back to your side while in a flat bed without using bedrails?: None Help needed moving from lying on your back to sitting on the side of a flat bed without using bedrails?: None Help needed moving to and from a bed to a chair (including a wheelchair)?: A Little Help needed standing up from a chair using your arms (e.g., wheelchair or bedside  chair)?: A Little Help needed to walk in hospital room?: A Little Help needed climbing 3-5 steps with a railing? : A Lot 6 Click Score: 19    End of Session Equipment Utilized During Treatment: Gait belt Activity Tolerance: Patient limited by fatigue Patient left: in bed;with call bell/phone within reach;with family/visitor present (Tech took pt to [employer]) Nurse Communication: Mobility status PT Visit Diagnosis: Unsteadiness on feet (R26.81);Muscle weakness (generalized) (M62.81)    Time: 6773-7366 PT Time Calculation (min) (ACUTE ONLY): 22 min   Charges:   PT Evaluation $PT Eval Low Complexity: 1 Low          Dailin Sosnowski M,PT Acute Rehab Services [phone] (pager)   Alvira Philips 08/09/2021, 1:00 PM

## 2021-08-09 NOTE — Progress Notes (Signed)
Patient seen and examined.  81 year old gentleman with history of aortic stenosis status post aortic valve replacement, coronary artery disease, coronary artery disease status post endarterectomy, essential tremors, depression anxiety and recently developed cognitive impairment fell while running and unsure how he fell.  Brought to the ER.  Skeletal survey consistent with cerebral rib fractures without evidence of complication.  Admitted under surgical service.  Medical service consulted to assess whether he had suffered any syncopal episode leading to fall.  Patient is poor historian overall.  1/28, Came to examine patient.  He was uncomfortable due to bed rest restrictions and wanted to walk.  He walked around the hallway with me.  He tells me that he might have tripped on something while running.  Denies any history of syncopal episodes, denies any orthostatic dizziness at other times. He was a scheduled for carotid ultrasound and 2D echocardiogram today, however he insist on going home. Seen with surgical service at the bedside.  Impression plan: Fall and blunt uncomplicated injury while running: No clinical evidence of cardiac arrhythmias.  Patient falling forward while running is likely mechanical injury. Patient is not willing to stay back for 2D echocardiogram and carotid ultrasound, and 1 episode of fall, these tests are usually not diagnostic.  He is not orthostatic.  He is mobile and walking around.  Pain is controlled. Since he is not willing to stay for the tests and doing reasonably well, he can be discharged from medical perspective. We discussed about fall precautions all the time at home.  He has a partner that will help him. Patient is also scheduled to be seen by neurology next month for cognitive impairment work-up.  General: Looks fairly comfortable.  Slightly impulsive.  Hard of hearing and insist on going home. Cardiovascular: S1-S2 normal.  Regular rate rhythm. Respiratory:  Bilateral clear.  No added sounds.  He is doing more than 1.5 L on incentive spermatic. Gastrointestinal: Soft.  Nontender. Ext:  He has some contusion of the lip, contusion of both knees.  No obvious hematoma or bleeding.   Total time spent: 30 minutes. Discussed with surgery team at the bedside.  Medically can be discharged with outpatient follow-up if stable from surgical standpoint.  Thank you for involving [place] in this patient's care.  We will sign off.

## 2021-08-09 NOTE — Progress Notes (Signed)
Carotid artery duplex has been completed. Preliminary results can be found in CV Proc through chart review.   08/09/21 11:53 AM Carlos Levering RVT

## 2021-08-09 NOTE — TOC Transition Note (Signed)
Transition of Care [facility]) - CM/SW Discharge Note   Patient Details  Name: Joshua Hess MRN: [id number] Date of Birth: [birth year]  [facility] CM/SW Contact:  Bartholomew Crews, RN Phone Number: [phone] 08/09/2021, 1:17 PM   Clinical Narrative:     Spoke with patient and son along with PT at the bedside to discuss post acute transition. Recommendations for OP PT with focus on balance. Patient has received OP rehab in the past. Referral sent. Well equipped for DME. Son to provide transportation home. No further TOC needs identified.   Final next level of care: OP Rehab Barriers to Discharge: No Barriers Identified   Patient Goals and CMS Choice Patient states their goals for this hospitalization and ongoing recovery are:: return home with son CMS Medicare.gov Compare Post Acute Care list provided to:: Patient Choice offered to / list presented to : Patient, Adult Children  Discharge Placement                       Discharge Plan and Services                DME Arranged: N/A DME Agency: NA       HH Arranged: NA HH Agency: NA        Social Determinants of Health (SDOH) Interventions     Readmission Risk Interventions No flowsheet data found.

## 2021-08-09 NOTE — ED Notes (Signed)
Pt anxious, wanting to get up, wanting to get back in bed, wanting to lay down and sit up. Pt asked about his anxiety and he states he takes Xanax at home. MD notified and he ordered his home medication Xanax. No acute changes noted. Will continue to monitor.

## 2021-08-09 NOTE — Progress Notes (Signed)
°  Echocardiogram 2D Echocardiogram has been performed.  Joshua Hess 08/09/2021, 9:43 AM

## 2021-08-09 NOTE — Care Management CC44 (Signed)
Condition Code 44 Documentation Completed  Patient Details  Name: Joshua Hess MRN: [id number] Date of Birth: [birth year]   Condition Code 44 given:  Yes Patient signature on Condition Code 44 notice:  Yes Documentation of 2 MD's agreement:  Yes Code 44 added to claim:  Yes    Bartholomew Crews, RN 08/09/2021, 10:49 AM

## 2021-08-09 NOTE — ED Notes (Signed)
Warm compress removed.

## 2021-08-10 NOTE — Discharge Summary (Signed)
Physician Discharge Summary  Patient ID: Joshua Hess MRN: [id number] DOB/AGE: 81/01/1941 81 y.o.  PCP: [name], MD  Admit date: 08/08/2021 Discharge date: 08/10/2021  Admission Diagnoses:  fall with rib fractures and contused lips  Discharge Diagnoses:  same  Principal Problem:   Rib fractures Active Problems:   S/P AVR (aortic valve replacement)   Mild CAD   Carotid artery disease (HCC)   Syncope   Rib fracture   Surgery:  none  Discharged Condition: improved  Hospital Course:   Was admitted for observation.  Seen by hospitalists who didn't think that this was syncope.  Patient wanted to go home.  He was very clear and motivated.  CXR repeat was negative for pneumothorax.  Echo pending.    Consults: [facility] hospitalists  Significant Diagnostic Studies: see extensive ER workup    Discharge Exam: Blood pressure (!) 185/85, pulse (!) 58, temperature 97.6 F (36.4 C), temperature source Oral, resp. rate 17, height 5\' 8"  (1.727 m), weight 79.4 kg, SpO2 97 %. Lips swollen;  no increased work of breathing.  Mentation clear  Disposition: Discharge disposition: 01-Home or Self Care       Discharge Instructions     Ambulatory referral to Physical Therapy   Complete by: As directed    Needs to work on balance including vestibular therapy   Call MD for:  difficulty breathing, headache or visual disturbances   Complete by: As directed    Diet - low sodium heart healthy   Complete by: As directed    Discharge instructions   Complete by: As directed    Be up and walk-deep breathe Diet regular May shower ad lib   Increase activity slowly   Complete by: As directed       Allergies as of 08/09/2021       Reactions   Flomax [tamsulosin] Other (See Comments)   Made feel foggy headed        Medication List     TAKE these medications    ALPRAZolam 0.5 MG tablet Commonly known as: XANAX Take 1 tablet by mouth at bedtime as needed for sleep (as directed).    amoxicillin 500 MG capsule Commonly known as: AMOXIL TAKE 4 CAPSULES BY MOUTH 1-2 HOURS PRIOR TO DENTAL APPOINTMENT   Apoaequorin 10 MG Caps Take 10 mg by mouth daily.   aspirin 81 MG EC tablet Take 1 tablet (81 mg total) by mouth daily. Swallow whole.   escitalopram 20 MG tablet Commonly known as: LEXAPRO Take 1 tablet by mouth daily.   fluoruracil 0.5 % cream Commonly known as: CARAC as directed.   magnesium hydroxide 400 MG/5ML suspension Commonly known as: MILK OF MAGNESIA Take 30 mLs by mouth daily as needed for mild constipation.   oxyCODONE 5 MG immediate release tablet Commonly known as: Oxy IR/ROXICODONE Take 1 tablet (5 mg total) by mouth every 6 (six) hours as needed for severe pain.   polyethylene glycol 17 g packet Commonly known as: MIRALAX / GLYCOLAX Take 17 g by mouth daily.   traZODone 50 MG tablet Commonly known as: DESYREL Take 3 tablets by mouth at bedtime.   Zypitamag 4 MG Tabs Generic drug: Pitavastatin Magnesium TAKE 1 TABLET BY MOUTH DAILY.         Signed: Pedro Earls 08/10/2021, 9:30 AM

## 2021-08-19 DIAGNOSIS — R2689 Other abnormalities of gait and mobility: Secondary | ICD-10-CM | POA: Diagnosis not present

## 2021-08-20 ENCOUNTER — Ambulatory Visit: Payer: Medicare HMO | Attending: Internal Medicine | Admitting: Physical Therapy

## 2021-08-20 ENCOUNTER — Other Ambulatory Visit: Payer: Self-pay

## 2021-08-20 ENCOUNTER — Encounter: Payer: Self-pay | Admitting: Physical Therapy

## 2021-08-20 DIAGNOSIS — S2241XA Multiple fractures of ribs, right side, initial encounter for closed fracture: Secondary | ICD-10-CM | POA: Diagnosis not present

## 2021-08-20 DIAGNOSIS — R42 Dizziness and giddiness: Secondary | ICD-10-CM | POA: Insufficient documentation

## 2021-08-20 DIAGNOSIS — R55 Syncope and collapse: Secondary | ICD-10-CM | POA: Diagnosis not present

## 2021-08-20 DIAGNOSIS — R2681 Unsteadiness on feet: Secondary | ICD-10-CM | POA: Insufficient documentation

## 2021-08-20 DIAGNOSIS — R296 Repeated falls: Secondary | ICD-10-CM | POA: Insufficient documentation

## 2021-08-20 DIAGNOSIS — T1490XA Injury, unspecified, initial encounter: Secondary | ICD-10-CM | POA: Insufficient documentation

## 2021-08-20 DIAGNOSIS — M6281 Muscle weakness (generalized): Secondary | ICD-10-CM | POA: Diagnosis not present

## 2021-08-20 DIAGNOSIS — R262 Difficulty in walking, not elsewhere classified: Secondary | ICD-10-CM | POA: Diagnosis not present

## 2021-08-20 NOTE — Therapy (Signed)
[facility] [address] [place], [place], [address] Phone: [phone]   Fax:  [phone]  Physical Therapy Evaluation  Patient Details  Name: Joshua Hess MRN: [id number] Date of Birth: March 27, 1941 Referring Provider (PT): Barb Merino, MD   Encounter Date: 08/20/2021   PT End of Session - 08/20/21 1545     Visit Number 1    Number of Visits 17    Date for PT Re-Evaluation 10/15/21    Authorization Type [employer]    PT Start Time 1227    PT Stop Time 1313    PT Time Calculation (min) 46 min    Activity Tolerance Patient tolerated treatment well    Behavior During Therapy [facility] for tasks assessed/performed;Impulsive             Past Medical History:  Diagnosis Date   Aphasia    Cancer [facility])    behind ear- [place]   Carotid artery stenosis    Chronotropic incompetence    ED (erectile dysfunction)    Elevated PSA    HOH (hard of hearing)    Mild CAD    Plantar fasciitis    Postoperative atrial fibrillation (HCC)    Severe aortic stenosis    a. s/p tissue AVR 10/2018.   Sinus bradycardia    baseline   Stroke ([place]) 03/05/2020   TIA   Wears glasses     Past Surgical History:  Procedure Laterality Date   AORTIC VALVE REPLACEMENT N/A 11/10/2018   Procedure: AORTIC VALVE REPLACEMENT (AVR), USING 23MM INSPIRIS;  Surgeon: Gaye Pollack, MD;  Location: [place];  Service: Open Heart Surgery;  Laterality: N/A;   ENDARTERECTOMY Left 03/08/2020   Procedure: ENDARTERECTOMY CAROTID;  Surgeon: Rosetta Posner, MD;  Location: [place];  Service: Vascular;  Laterality: Left;   EXTERNAL EAR SURGERY     melanoma removed   HERNIA REPAIR Right    inguinal   INGUINAL HERNIA REPAIR Right 11/15/2019   Procedure: LAPAROSCOPIC RIGHT INGUINAL HERNIA REPAIR WITH MESH;  Surgeon: Coralie Keens, MD;  Location: [place];  Service: General;  Laterality: Right;   RIGHT/LEFT HEART CATH AND CORONARY ANGIOGRAPHY N/A 09/29/2018   Procedure:  RIGHT/LEFT HEART CATH AND CORONARY ANGIOGRAPHY;  Surgeon: Burnell Blanks, MD;  Location: [place] [facility];  Service: Cardiovascular;  Laterality: N/A;   TEE WITHOUT CARDIOVERSION N/A 11/10/2018   Procedure: TRANSESOPHAGEAL ECHOCARDIOGRAM (TEE);  Surgeon: Gaye Pollack, MD;  Location: [place];  Service: Open Heart Surgery;  Laterality: N/A;   TRANSCATHETER AORTIC VALVE REPLACEMENT, TRANSFEMORAL     PROCEDURE ATTEMPTED & ABORTED    UMBILICAL HERNIA REPAIR N/A 11/15/2019   Procedure: UMBILICAL HERNIA REPAIR;  Surgeon: Coralie Keens, MD;  Location: [place];  Service: General;  Laterality: N/A;    There were no vitals filed for this visit.    Subjective Assessment - 08/20/21 1229     Subjective Patient's husband reports that the patient experienced 4 falls in the past month. Knocked a few teeth out and broke a few ribs on the last fall he had on 08/08/21. Was an avid runner, and still likes to "run walk" on some days- however PCP advised against this. 2 Falls occurred outside while trying to run, other falls inside. Appointment with Neurology on 09/16/21. Experiences a mild stroke in August 2021 after which he experienced balance issues and dizziness. Also has tremors in his hands. Notes dizziness with quick head turns or nods, standing up  quickly. Better with rest. Husband and patient unsure if related to stroke. Does not use an AD when walking.    Patient is accompained by: Family member   husband providing majority of subjective report   Pertinent History aortic stenosis status post aortic valve replacement, coronary artery disease status post endarterectomy, essential tremors, depression and anxiety, HOH, sinus bradycardia, stroke 2021    Limitations Lifting;Standing;Walking;House hold activities    Diagnostic tests none recent    Patient Stated Goals improve balance    Currently in Pain? Other (Comment)   current referral not pain-related               [facility] PT Assessment -  08/20/21 1234       Assessment   Medical Diagnosis Trauma, Syncope, unspecified syncope type, Closed fracture of multiple ribs of right side, initial encounter    Referring Provider (PT) Barb Merino, MD    Onset Date/Surgical Date 08/08/21    Next MD Visit not scheduled    Prior Therapy yes- for dizziness      Precautions   Precautions Fall   HOH, R rib fxs     Balance Screen   Has the patient fallen in the past 6 months Yes    How many times? 4    Has the patient had a decrease in activity level because of a fear of falling?  Yes    Is the patient reluctant to leave their home because of a fear of falling?  Yes      [place] residence    Living Arrangements Spouse/significant other    Available Help at Discharge Family    Type of [place] to enter    Entrance Stairs-Number of Steps 2    Entrance Stairs-Rails Cannot reach both;Left;Right    [place] to live on main level with bedroom/bathroom;Two level   pt does not go upstairs   [employer] - quad;Shower seat;Grab bars - tub/shower   walking stick, step in shower     Prior Function   Level of Independence Independent    Vocation Retired    Leisure walking      Cognition   Overall Cognitive Status History of cognitive impairments - at baseline      Sensation   Light Touch Appears Intact      Coordination   Gross Motor Movements are Fluid and Coordinated Yes   mild R>L hand tremor at rest     Posture/Postural Control   Posture/Postural Control Postural limitations    Postural Limitations Rounded Shoulders;Forward head      ROM / Strength   AROM / PROM / Strength AROM      AROM   AROM Assessment Site Ankle    Right/Left Ankle Right;Left    Right Ankle Dorsiflexion 5    Left Ankle Dorsiflexion 15      Strength   Strength Assessment Site Hip;Knee;Ankle    Right/Left Hip Right;Left    Right Hip Flexion 4+/5    Right Hip ABduction  4+/5    Right Hip ADduction 4/5    Left Hip Flexion 4+/5    Left Hip ABduction 4+/5    Left Hip ADduction 4/5    Right/Left Knee Right;Left    Right Knee Flexion 4+/5    Right Knee Extension 5/5    Left Knee Flexion 4+/5    Left Knee Extension 5/5    Right/Left Ankle  Right;Left    Right Ankle Dorsiflexion 2+/5    Left Ankle Dorsiflexion 4+/5      Ambulation/Gait   Assistive device None    Gait Pattern Step-to pattern;Step-through pattern;Poor foot clearance - left;Poor foot clearance - right;Right flexed knee in stance;Left flexed knee in stance    Ambulation Surface Level;Indoor    Gait velocity slightly decreased      Standardized Balance Assessment   Standardized Balance Assessment Dynamic Gait Index;Timed Up and Go Test      Dynamic Gait Index   Level Surface Normal    Change in Gait Speed Moderate Impairment    Gait with Horizontal Head Turns Moderate Impairment   dizziness   Gait with Vertical Head Turns Mild Impairment   dizziness   Gait and Pivot Turn Severe Impairment   dizziness   Step Over Obstacle Mild Impairment    Step Around Obstacles Mild Impairment    Steps Mild Impairment    Total Score 13      Timed Up and Go Test   Normal TUG (seconds) 14.89   requiring instruction throughout d/t cognition                       Objective measurements completed on examination: See above findings.                PT Education - 08/20/21 1544     Education Details prognosis, POC, HEP- Access Code: [email or web address], to be performed at counter and with husband supervision    Person(s) Educated Patient    Methods Explanation;Demonstration;Tactile cues;Verbal cues;Handout    Comprehension Verbalized understanding              PT Short Term Goals - 08/20/21 1554       PT SHORT TERM GOAL #1   Title Patient to perform initial HEP with family assistance.    Time 3    Period Weeks    Status New    Target Date 09/10/21               PT  Long Term Goals - 08/20/21 1555       PT LONG TERM GOAL #1   Title Patient to perform advanced HEP with family assistance.    Time 8    Period Weeks    Status New    Target Date 10/15/21      PT LONG TERM GOAL #2   Title Patient to score at least 18/24 on DGI in order to decrease risk of falls.    Time 8    Period Weeks    Status New    Target Date 10/15/21      PT LONG TERM GOAL #3   Title Patient to report 0/10 dizziness with standing vertical and horizontal head movements at variable speeds.    Time 8    Period Weeks    Status New    Target Date 10/15/21      PT LONG TERM GOAL #4   Title Patient to report no falls in the past 3 weeks.    Time 8    Period Weeks    Status New    Target Date 10/15/21      PT LONG TERM GOAL #5   Title Patient will ambulate over outdoor surfaces with LRAD while performing head turns to scan environment with good stability in order to indicate safe community mobility.    Time 8    Period Weeks  Status New    Target Date 10/15/21      Additional Long Term Goals   Additional Long Term Goals Yes                    Plan - 08/20/21 1546     Clinical Impression Statement Patient is an 81 y/o M presenting to OPPT with spouse with c/o worsening falls and chronic dizziness for the past month. Of note, patient with hospital admission from 01/27-28/23 d/t fall resulting in cognitive impairment and R rib fxs. Patient a poor historian, thus spouse assisted with subjective report and reports 4 falls in the past month, however patient does not use AD. Notes dizziness with quick head turns, nods, standing up quickly. Patient today presenting with decreased hip and R ankle strength, decreased R>L ankle dorsiflexion AROM, imbalance, and gait deviations. Patients score on DGI indicates an increased risk of falls, thus thoroughly educated patient on importance of using AD with ambulation. Patient and husband were educated on gentle stretching and  balance HEP and reported understanding. Would benefit from skilled PT services 1-2x/week for 8 weeks to address aforementioned impairments in order to optimize level of function.    Personal Factors and Comorbidities Comorbidity 3+;Age;Fitness;Past/Current Experience;Time since onset of injury/illness/exacerbation    Comorbidities aortic stenosis status post aortic valve replacement, coronary artery disease status post endarterectomy, essential tremors, depression and anxiety, HOH, sinus bradycardia, stroke 2021    Examination-Activity Limitations Locomotion Level;Transfers;Bed Mobility;Reach Overhead;Bathing;Bend;Carry;Squat;Stairs;Dressing;Hygiene/Grooming;Stand;Lift    Examination-Participation Restrictions Community Activity;Yard Work;Laundry;Shop;Cleaning;Church;Meal Prep    Stability/Clinical Decision Making Evolving/Moderate complexity    Clinical Decision Making Moderate    Rehab Potential Good    PT Frequency 2x / week    PT Duration 8 weeks    PT Treatment/Interventions ADLs/Self Care Home Management;Canalith Repostioning;Gait training;Stair training;Functional mobility training;Therapeutic activities;Therapeutic exercise;Balance training;Neuromuscular re-education;Patient/family education;Visual/perceptual remediation/compensation;Vestibular;Cryotherapy;DME Instruction;Moist Heat;Manual techniques;Orthotic Fit/Training;Passive range of motion;Dry needling;Energy conservation;Taping    PT Next Visit Plan reassess HEP, vestibular assessment, progress balance and gait with SPC/walking stick    PT Home Exercise Plan none at this time    Consulted and Agree with Plan of Care Patient;Family member/caregiver    Family Member Consulted spouse             Patient will benefit from skilled therapeutic intervention in order to improve the following deficits and impairments:  Abnormal gait, Difficulty walking, Decreased balance, Decreased strength, Decreased range of motion, Decreased safety  awareness, Dizziness, Decreased activity tolerance, Impaired flexibility, Postural dysfunction  Visit Diagnosis: Repeated falls  Unsteadiness on feet  Dizziness and giddiness     Problem List Patient Active Problem List   Diagnosis Date Noted   Rib fracture 08/09/2021   Syncope 08/08/2021   Rib fractures 08/08/2021   Pain due to onychomycosis of toenails of both feet 04/30/2021   Cognitive impairment 11/11/2020   Depression with anxiety 11/11/2020   Carotid artery disease ([place]) 11/11/2020   Essential tremor 11/11/2020   Bunionette of left foot 06/20/2020   Fatigue 06/20/2020   History of left-sided carotid endarterectomy 05/27/2020   Facial droop 03/05/2020   Mild CAD    Acute encephalopathy 11/18/2019   Acute urinary retention 11/18/2019   Post-operative state 11/18/2019   Aphasia 08/07/2019   S/P AVR (aortic valve replacement)    Carotid artery stenosis    Chronotropic incompetence    Severe aortic stenosis    Sinus bradycardia 08/12/2018   Malignant melanoma of scalp ([place]) 09/22/2015   Benign prostatic hyperplasia with nocturia  11/01/2012   Enlarged prostate with lower urinary tract symptoms (LUTS) 11/01/2012     Janene Harvey, PT, DPT 08/20/21 4:02 PM   [facility] [address], [place], [place], [address] Phone: [phone]   Fax:  [phone]  Name: Joshua Hess MRN: [id number] Date of Birth: 1941-01-23

## 2021-08-20 NOTE — Patient Instructions (Signed)
Access Code: [email or web address] URL: [email or web address] Date: 08/20/2021 Prepared by: [facility]  Program Notes perform at a counter top and with your husband for safety!   Exercises Standing Gastroc Stretch at Counter - 1 x daily - 5 x weekly - 2 sets - 30 sec hold Heel Toe Raises with Counter Support - 1 x daily - 5 x weekly - 2 sets - 10 reps Standing Toe Taps - 1 x daily - 5 x weekly - 2 sets - 10 reps Seated Gaze Stabilization with Head Rotation - 1 x daily - 5 x weekly - 2 sets - 10 reps Seated Gaze Stabilization with Head Nod - 1 x daily - 5 x weekly - 2 sets - 10 reps

## 2021-08-20 NOTE — Therapy (Signed)
[facility] [address], [place], [place], [address] Phone: [phone]   Fax:  [phone]  Patient Details  Name: Joshua Hess MRN: [id number] Date of Birth: August 26, 1940 Referring Provider:  No ref. provider found  SPEECH THERAPY DISCHARGE SUMMARY  Visits from Start of Care: <5  Current functional level related to goals / functional outcomes: See below: SLP Long Term Goals - 01/10/20 1750              SLP LONG TERM GOAL #1    Title pt will generate 10 minutes WNL mod complex/complex conversation with modified independence x2 sessions     Time 4     Period Weeks   or 9 visits total for all LTGs unless specified otherwise    Status On-going          SLP LONG TERM GOAL #2    Title pt will report incr'd success with word finding than at time of eval     Time 4     Period Weeks     Status On-going          SLP LONG TERM GOAL #3    Title pt will undergo cognitive linguistic testing if necessary     Status Deferred   resolved                    Plan - 01/10/20 1747     Clinical Impression Statement Pt presents today with reported improved anomia (Aphasia R47.01) since SLE 12-12-19. [name] reports pt's confusion has resolved. Pt agreed to one more session training pt with some at-home tasks and in some anomia compensations. He perfomed very well today with semantic feature analysis (SFA), and may have resolved to the point he may not beneift from skilled ST at this time. Additionally, pt appeared reluctant or reticent, or defensive about engaging with ST tasks with SLP.     Speech Therapy Frequency 2x / week     Duration 4 weeks   or 9 total sessions (including eval)       Remaining deficits: Unknown, pt not seen since 2021   Education / Equipment: Compensations for aphasia   Patient agrees to discharge. Patient goals were not met. Patient is being discharged due to not returning since the last visit..    Encounter Date:  08/20/2021   Glennis Brink 08/20/2021, 11:32 PM  [facility] [address] [place], [place], [address] Phone: [phone]   Fax:  [phone]

## 2021-08-22 ENCOUNTER — Ambulatory Visit: Payer: Medicare HMO

## 2021-08-22 ENCOUNTER — Other Ambulatory Visit: Payer: Self-pay

## 2021-08-22 DIAGNOSIS — R42 Dizziness and giddiness: Secondary | ICD-10-CM

## 2021-08-22 DIAGNOSIS — M6281 Muscle weakness (generalized): Secondary | ICD-10-CM

## 2021-08-22 DIAGNOSIS — R262 Difficulty in walking, not elsewhere classified: Secondary | ICD-10-CM | POA: Diagnosis not present

## 2021-08-22 DIAGNOSIS — S2241XA Multiple fractures of ribs, right side, initial encounter for closed fracture: Secondary | ICD-10-CM | POA: Diagnosis not present

## 2021-08-22 DIAGNOSIS — R296 Repeated falls: Secondary | ICD-10-CM

## 2021-08-22 DIAGNOSIS — R2681 Unsteadiness on feet: Secondary | ICD-10-CM | POA: Diagnosis not present

## 2021-08-22 DIAGNOSIS — R55 Syncope and collapse: Secondary | ICD-10-CM | POA: Diagnosis not present

## 2021-08-22 DIAGNOSIS — T1490XA Injury, unspecified, initial encounter: Secondary | ICD-10-CM | POA: Diagnosis not present

## 2021-08-22 NOTE — Therapy (Signed)
[facility] [address], [place], [place], [address] Phone: [phone]   Fax:  [phone]  Physical Therapy Treatment  Patient Details  Name: Joshua Hess MRN: [id number] Date of Birth: 1940-10-18 Referring Provider (PT): Barb Merino, MD   Encounter Date: 08/22/2021   PT End of Session - 08/22/21 1057     Visit Number 2    Number of Visits 17    Date for PT Re-Evaluation 10/15/21    Authorization Type [employer]    Progress Note Due on Visit 10    PT Start Time 1100    PT Stop Time 1145    PT Time Calculation (min) 45 min    Activity Tolerance Patient tolerated treatment well    Behavior During Therapy [facility] for tasks assessed/performed;Impulsive             Past Medical History:  Diagnosis Date   Aphasia    [facility]cer [facility])    behind ear- [place]   Carotid artery stenosis    Chronotropic incompetence    ED (erectile dysfunction)    Elevated PSA    HOH (hard of hearing)    Mild CAD    Plantar fasciitis    Postoperative atrial fibrillation (HCC)    Severe aortic stenosis    a. s/p tissue AVR 10/2018.   Sinus bradycardia    baseline   Stroke ([place]) 03/05/2020   TIA   Wears glasses     Past Surgical History:  Procedure Laterality Date   AORTIC VALVE REPLACEMENT N/A 11/10/2018   Procedure: AORTIC VALVE REPLACEMENT (AVR), USING 23MM INSPIRIS;  Surgeon: Gaye Pollack, MD;  Location: [place];  Service: Open Heart Surgery;  Laterality: N/A;   ENDARTERECTOMY Left 03/08/2020   Procedure: ENDARTERECTOMY CAROTID;  Surgeon: Rosetta Posner, MD;  Location: [place];  Service: Vascular;  Laterality: Left;   EXTERNAL EAR SURGERY     melanoma removed   HERNIA REPAIR Right    inguinal   INGUINAL HERNIA REPAIR Right 11/15/2019   Procedure: LAPAROSCOPIC RIGHT INGUINAL HERNIA REPAIR WITH MESH;  Surgeon: Coralie Keens, MD;  Location: [place];  Service: General;  Laterality: Right;   RIGHT/LEFT HEART CATH AND CORONARY  ANGIOGRAPHY N/A 09/29/2018   Procedure: RIGHT/LEFT HEART CATH AND CORONARY ANGIOGRAPHY;  Surgeon: Burnell Blanks, MD;  Location: [place] CV [facility];  Service: Cardiovascular;  Laterality: N/A;   TEE WITHOUT CARDIOVERSION N/A 11/10/2018   Procedure: TRANSESOPHAGEAL ECHOCARDIOGRAM (TEE);  Surgeon: Gaye Pollack, MD;  Location: [place];  Service: Open Heart Surgery;  Laterality: N/A;   TRANSCATHETER AORTIC VALVE REPLACEMENT, TRANSFEMORAL     PROCEDURE ATTEMPTED & ABORTED    UMBILICAL HERNIA REPAIR N/A 11/15/2019   Procedure: UMBILICAL HERNIA REPAIR;  Surgeon: Coralie Keens, MD;  Location: [place];  Service: General;  Laterality: N/A;    There were no vitals filed for this visit.   Subjective Assessment - 08/22/21 1102     Subjective Patient notes continued soreness in ribs. Dizziness and wooziness present when first arising and beginning to walk. Tx initiated with BP measurement which reveals 154/82 mmHg and HR 53 bpm measured in sitting.    Patient is accompained by: Family member   husband providing majority of subjective report   Pertinent History aortic stenosis status post aortic valve replacement, coronary artery disease status post endarterectomy, essential tremors, depression and anxiety, HOH, sinus bradycardia, stroke 2021    Limitations Lifting;Standing;Walking;House hold activities    Diagnostic  tests none recent    Patient Stated Goals improve balance    Currently in Pain? No/denies    Pain Score 0-No pain                               [facility] Adult PT Treatment/Exercise - 08/22/21 0001       Ambulation/Gait   Ambulation/Gait Yes    Pre-Gait Activities Reciprocal arm swing for increased amplitude and fast alternating movements. High knees 3x10 reps at counter top followed by 3x30 sec for rapid alternating foot strike    Gait Comments Jogging/running analysis performed with CGA for 30 ft increments x 10 trials.      Knee/Hip Exercises: Standing   SLS  static standing with foot elevated on 8" box for SLS 4x15 sec right/left                       PT Short Term Goals - 08/20/21 1554       PT SHORT TERM GOAL #1   Title Patient to perform initial HEP with family assistance.    Time 3    Period Weeks    Status New    Target Date 09/10/21               PT Long Term Goals - 08/20/21 1555       PT LONG TERM GOAL #1   Title Patient to perform advanced HEP with family assistance.    Time 8    Period Weeks    Status New    Target Date 10/15/21      PT LONG TERM GOAL #2   Title Patient to score at least 18/24 on DGI in order to decrease risk of falls.    Time 8    Period Weeks    Status New    Target Date 10/15/21      PT LONG TERM GOAL #3   Title Patient to report 0/10 dizziness with standing vertical and horizontal head movements at variable speeds.    Time 8    Period Weeks    Status New    Target Date 10/15/21      PT LONG TERM GOAL #4   Title Patient to report no falls in the past 3 weeks.    Time 8    Period Weeks    Status New    Target Date 10/15/21      PT LONG TERM GOAL #5   Title Patient will ambulate over outdoor surfaces with LRAD while performing head turns to scan environment with good stability in order to indicate safe community mobility.    Time 8    Period Weeks    Status New    Target Date 10/15/21      Additional Long Term Goals   Additional Long Term Goals Yes                   Plan - 08/22/21 1201     Clinical Impression Statement Demonstrates difficulty with rapid alternating movement and decreased amplitude of movement especially with trials of jogging/fast walking with decomposition of RLE movements and decrease in foot clearance.  Demo one instance of unsteadiness when walking today when first arising from chair with lateral pathway deviation, but no LOB.  Difficulty in sustaining large amplitude movements requiring frequent cues and redirection to task and  proper sequence. During single leg stance trials demo  time of 5-7 sec right and 9 sec left with need for trunk sway compensation right > left. Addition of single leg stance activity and high stepping/rapid stepping for HEP to improve coordination    Personal Factors and Comorbidities Comorbidity 3+;Age;Fitness;Past/Current Experience;Time since onset of injury/illness/exacerbation    Comorbidities aortic stenosis status post aortic valve replacement, coronary artery disease status post endarterectomy, essential tremors, depression and anxiety, HOH, sinus bradycardia, stroke 2021    Examination-Activity Limitations Locomotion Level;Transfers;Bed Mobility;Reach Overhead;Bathing;Bend;Carry;Squat;Stairs;Dressing;Hygiene/Grooming;Stand;Lift    Examination-Participation Restrictions Community Activity;Yard Work;Laundry;Shop;Cleaning;Church;Meal Prep    Stability/Clinical Decision Making Evolving/Moderate complexity    Rehab Potential Good    PT Frequency 2x / week    PT Duration 8 weeks    PT Treatment/Interventions ADLs/Self Care Home Management;Canalith Repostioning;Gait training;Stair training;Functional mobility training;Therapeutic activities;Therapeutic exercise;Balance training;Neuromuscular re-education;Patient/family education;Visual/perceptual remediation/compensation;Vestibular;Cryotherapy;DME Instruction;Moist Heat;Manual techniques;Orthotic Fit/Training;Passive range of motion;Dry needling;Energy conservation;Taping    PT Next Visit Plan reassess HEP, vestibular assessment, progress balance and gait with SPC/walking stick    PT Home Exercise Plan hip flexion (high knees) at counter then fast feet 3x30 sec. STanding balance with LE elevated in cabinet for 5-10 reps 15 sec    Consulted and Agree with Plan of Care Patient;Family member/caregiver    Family Member Consulted spouse             Patient will benefit from skilled therapeutic intervention in order to improve the following deficits  and impairments:  Abnormal gait, Difficulty walking, Decreased balance, Decreased strength, Decreased range of motion, Decreased safety awareness, Dizziness, Decreased activity tolerance, Impaired flexibility, Postural dysfunction  Visit Diagnosis: Repeated falls  Unsteadiness on feet  Dizziness and giddiness  Muscle weakness (generalized)  Difficulty in walking, not elsewhere classified     Problem List Patient Active Problem List   Diagnosis Date Noted   Rib fracture 08/09/2021   Syncope 08/08/2021   Rib fractures 08/08/2021   Pain due to onychomycosis of toenails of both feet 04/30/2021   Cognitive impairment 11/11/2020   Depression with anxiety 11/11/2020   Carotid artery disease ([place]) 11/11/2020   Essential tremor 11/11/2020   Bunionette of left foot 06/20/2020   Fatigue 06/20/2020   History of left-sided carotid endarterectomy 05/27/2020   Facial droop 03/05/2020   Mild CAD    Acute encephalopathy 11/18/2019   Acute urinary retention 11/18/2019   Post-operative state 11/18/2019   Aphasia 08/07/2019   S/P AVR (aortic valve replacement)    Carotid artery stenosis    Chronotropic incompetence    Severe aortic stenosis    Sinus bradycardia 08/12/2018   Malignant melanoma of scalp ([place]) 09/22/2015   Benign prostatic hyperplasia with nocturia 11/01/2012   Enlarged prostate with lower urinary tract symptoms (LUTS) 11/01/2012    Toniann Fail, PT 08/22/2021, 12:05 PM  [facility] [address], [place], [place], [address] Phone: [phone]   Fax:  [phone]  Name: Joshua Hess MRN: [id number] Date of Birth: [birth year]

## 2021-08-26 ENCOUNTER — Ambulatory Visit: Payer: Medicare HMO

## 2021-08-26 ENCOUNTER — Other Ambulatory Visit: Payer: Self-pay

## 2021-08-26 DIAGNOSIS — R2681 Unsteadiness on feet: Secondary | ICD-10-CM

## 2021-08-26 DIAGNOSIS — R42 Dizziness and giddiness: Secondary | ICD-10-CM | POA: Diagnosis not present

## 2021-08-26 DIAGNOSIS — R262 Difficulty in walking, not elsewhere classified: Secondary | ICD-10-CM | POA: Diagnosis not present

## 2021-08-26 DIAGNOSIS — R55 Syncope and collapse: Secondary | ICD-10-CM | POA: Diagnosis not present

## 2021-08-26 DIAGNOSIS — M6281 Muscle weakness (generalized): Secondary | ICD-10-CM | POA: Diagnosis not present

## 2021-08-26 DIAGNOSIS — T1490XA Injury, unspecified, initial encounter: Secondary | ICD-10-CM | POA: Diagnosis not present

## 2021-08-26 DIAGNOSIS — S2241XA Multiple fractures of ribs, right side, initial encounter for closed fracture: Secondary | ICD-10-CM | POA: Diagnosis not present

## 2021-08-26 DIAGNOSIS — R296 Repeated falls: Secondary | ICD-10-CM

## 2021-08-26 NOTE — Therapy (Signed)
[facility] [address], [place], [place], [address] Phone: [phone]   Fax:  [phone]  Physical Therapy Treatment  Patient Details  Name: Joshua Hess MRN: [id number] Date of Birth: 1941/02/02 Referring Provider (PT): Barb Merino, MD   Encounter Date: 08/26/2021   PT End of Session - 08/26/21 1057     Visit Number 3    Number of Visits 17    Date for PT Re-Evaluation 10/15/21    Authorization Type [employer]    Progress Note Due on Visit 10    PT Start Time 1100    PT Stop Time 1145    PT Time Calculation (min) 45 min    Activity Tolerance Patient tolerated treatment well    Behavior During Therapy [facility] for tasks assessed/performed;Impulsive             Past Medical History:  Diagnosis Date   [facility]    [facility]    behind ear- [place]   Carotid artery stenosis    Chronotropic incompetence    ED (erectile dysfunction)    Elevated PSA    HOH (hard of hearing)    Mild CAD    Plantar fasciitis    Postoperative atrial fibrillation (HCC)    Severe aortic stenosis    a. s/p tissue AVR 10/2018.   Sinus bradycardia    baseline   Stroke ([place]) 03/05/2020   TIA   Wears glasses     Past Surgical History:  Procedure Laterality Date   AORTIC VALVE REPLACEMENT N/A 11/10/2018   Procedure: AORTIC VALVE REPLACEMENT (AVR), USING 23MM INSPIRIS;  Surgeon: Gaye Pollack, MD;  Location: [place];  Service: Open Heart Surgery;  Laterality: N/A;   ENDARTERECTOMY Left 03/08/2020   Procedure: ENDARTERECTOMY CAROTID;  Surgeon: Rosetta Posner, MD;  Location: [place];  Service: Vascular;  Laterality: Left;   EXTERNAL EAR SURGERY     melanoma removed   HERNIA REPAIR Right    inguinal   INGUINAL HERNIA REPAIR Right 11/15/2019   Procedure: LAPAROSCOPIC RIGHT INGUINAL HERNIA REPAIR WITH MESH;  Surgeon: Coralie Keens, MD;  Location: [place];  Service: General;  Laterality: Right;   RIGHT/LEFT HEART CATH AND CORONARY  ANGIOGRAPHY N/A 09/29/2018   Procedure: RIGHT/LEFT HEART CATH AND CORONARY ANGIOGRAPHY;  Surgeon: Burnell Blanks, MD;  Location: [place] [facility];  Service: Cardiovascular;  Laterality: N/A;   TEE WITHOUT CARDIOVERSION N/A 11/10/2018   Procedure: TRANSESOPHAGEAL ECHOCARDIOGRAM (TEE);  Surgeon: Gaye Pollack, MD;  Location: [place];  Service: Open Heart Surgery;  Laterality: N/A;   TRANSCATHETER AORTIC VALVE REPLACEMENT, TRANSFEMORAL     PROCEDURE ATTEMPTED & ABORTED    UMBILICAL HERNIA REPAIR N/A 11/15/2019   Procedure: UMBILICAL HERNIA REPAIR;  Surgeon: Coralie Keens, MD;  Location: [place];  Service: General;  Laterality: N/A;    There were no vitals filed for this visit.   Subjective Assessment - 08/26/21 1104     Subjective Reports not feeling very good past 2 days, chief complaint being "fullness/soreness" around chest and ribs most notably when rolling in bed, pushing to stand, or lifting. Continues to experience unsteadiness mostly when transitioning from stationary to standing/moving    Patient is accompained by: Family member   husband providing majority of subjective report   Pertinent History aortic stenosis status post aortic valve replacement, coronary artery disease status post endarterectomy, essential tremors, depression and anxiety, HOH, sinus bradycardia, stroke 2021    Limitations Lifting;Standing;Walking;House hold  activities    Diagnostic tests none recent    Patient Stated Goals improve balance    Currently in Pain? No/denies                [facility] PT Assessment - 08/26/21 0001       Assessment   Medical Diagnosis Trauma, Syncope, unspecified syncope type, Closed fracture of multiple ribs of right side, initial encounter                           [facility] Adult PT Treatment/Exercise - 08/26/21 0001       Neuro Re-ed    Neuro Re-ed Details  static standing with offset position elevating LE onto 8" box 3x15 sec alternating . Same drill  performed with head turns to target 3x15 sec and then with vertical head movements. Alternating stair taps 8" step x 2 min. Demo RLE deficits in coordination and precision > left. No abnormal eye tracking in smooth pursuits or saccades appreciated.      Lumbar Exercises: Aerobic   Recumbent Bike iso-strength x 5 min at 70 RPM for consistent workload to assess for DOE or rib pain with exertion                     PT Education - 08/26/21 1154     Education Details education on splinting his rolling/bed mobility with pillow to reduce rib pain              PT Short Term Goals - 08/20/21 1554       PT SHORT TERM GOAL #1   Title Patient to perform initial HEP with family assistance.    Time 3    Period Weeks    Status New    Target Date 09/10/21               PT Long Term Goals - 08/20/21 1555       PT LONG TERM GOAL #1   Title Patient to perform advanced HEP with family assistance.    Time 8    Period Weeks    Status New    Target Date 10/15/21      PT LONG TERM GOAL #2   Title Patient to score at least 18/24 on DGI in order to decrease risk of falls.    Time 8    Period Weeks    Status New    Target Date 10/15/21      PT LONG TERM GOAL #3   Title Patient to report 0/10 dizziness with standing vertical and horizontal head movements at variable speeds.    Time 8    Period Weeks    Status New    Target Date 10/15/21      PT LONG TERM GOAL #4   Title Patient to report no falls in the past 3 weeks.    Time 8    Period Weeks    Status New    Target Date 10/15/21      PT LONG TERM GOAL #5   Title Patient will ambulate over outdoor surfaces with LRAD while performing head turns to scan environment with good stability in order to indicate safe community mobility.    Time 8    Period Weeks    Status New    Target Date 10/15/21      Additional Long Term Goals   Additional Long Term Goals Yes  Plan - 08/26/21 1154      Clinical Impression Statement Demonstrates more coordination deficits RUE/RLE than left side.  Pt becomes fearful of falling with offset/single limb support position especially coupled with head rotation vs vertical.  Difficulty with coordinating alternating movements via stair tapping RLE > LLE. Pt demo minimal HR elevation from resting 67 bpm to 71 bpm with cycling activity which he rates at 5/10 RPE. Continued sessions to improve balance, coordination, and reduce risk for falls    Personal Factors and Comorbidities Comorbidity 3+;Age;Fitness;Past/Current Experience;Time since onset of injury/illness/exacerbation    Comorbidities aortic stenosis status post aortic valve replacement, coronary artery disease status post endarterectomy, essential tremors, depression and anxiety, HOH, sinus bradycardia, stroke 2021    Examination-Activity Limitations Locomotion Level;Transfers;Bed Mobility;Reach Overhead;Bathing;Bend;Carry;Squat;Stairs;Dressing;Hygiene/Grooming;Stand;Lift    Examination-Participation Restrictions Community Activity;Yard Work;Laundry;Shop;Cleaning;Church;Meal Prep    Stability/Clinical Decision Making Evolving/Moderate complexity    Rehab Potential Good    PT Frequency 2x / week    PT Duration 8 weeks    PT Treatment/Interventions ADLs/Self Care Home Management;Canalith Repostioning;Gait training;Stair training;Functional mobility training;Therapeutic activities;Therapeutic exercise;Balance training;Neuromuscular re-education;Patient/family education;Visual/perceptual remediation/compensation;Vestibular;Cryotherapy;DME Instruction;Moist Heat;Manual techniques;Orthotic Fit/Training;Passive range of motion;Dry needling;Energy conservation;Taping    PT Next Visit Plan reassess HEP, vestibular assessment, progress balance and gait with SPC/walking stick    PT Home Exercise Plan hip flexion (high knees) at counter then fast feet 3x30 sec. STanding balance with LE elevated in cabinet for 5-10 reps  15 sec    Consulted and Agree with Plan of Care Patient;Family member/caregiver    Family Member Consulted spouse             Patient will benefit from skilled therapeutic intervention in order to improve the following deficits and impairments:  Abnormal gait, Difficulty walking, Decreased balance, Decreased strength, Decreased range of motion, Decreased safety awareness, Dizziness, Decreased activity tolerance, Impaired flexibility, Postural dysfunction  Visit Diagnosis: Repeated falls  Unsteadiness on feet  Dizziness and giddiness  Muscle weakness (generalized)  Difficulty in walking, not elsewhere classified     Problem List Patient Active Problem List   Diagnosis Date Noted   Rib fracture 08/09/2021   Syncope 08/08/2021   Rib fractures 08/08/2021   Pain due to onychomycosis of toenails of both feet 04/30/2021   Cognitive impairment 11/11/2020   Depression with anxiety 11/11/2020   Carotid artery disease ([place]) 11/11/2020   Essential tremor 11/11/2020   Bunionette of left foot 06/20/2020   Fatigue 06/20/2020   History of left-sided carotid endarterectomy 05/27/2020   Facial droop 03/05/2020   Mild CAD    Acute encephalopathy 11/18/2019   Acute urinary retention 11/18/2019   Post-operative state 11/18/2019   Aphasia 08/07/2019   S/P AVR (aortic valve replacement)    Carotid artery stenosis    Chronotropic incompetence    Severe aortic stenosis    Sinus bradycardia 08/12/2018   Malignant melanoma of scalp ([place]) 09/22/2015   Benign prostatic hyperplasia with nocturia 11/01/2012   Enlarged prostate with lower urinary tract symptoms (LUTS) 11/01/2012    Toniann Fail, PT 08/26/2021, 11:57 AM  [facility] [address], [place], [place], [address] Phone: [phone]   Fax:  [phone]  Name: BLADYN TIPPS MRN: [id number] Date of Birth: Sep 19, 1940

## 2021-08-28 IMAGING — CT CT HEAD W/O CM
4 series · 15 of 47 positions shown, 17 images · non-contrast
Comparison: Head CT dated 07/31/2019

CLINICAL DATA: 79-year-old male with encephalopathy

EXAM:
CT HEAD WITHOUT CONTRAST
TECHNIQUE: Contiguous axial images were obtained from the base of the skull
through the vertex without intravenous contrast.

[Series 3: head without · axial · non-contrast · 0.48mm/px · z∈[-188,-63]mm · 6 of 35 slices shown, 8 images]
[im 5/35  brain]
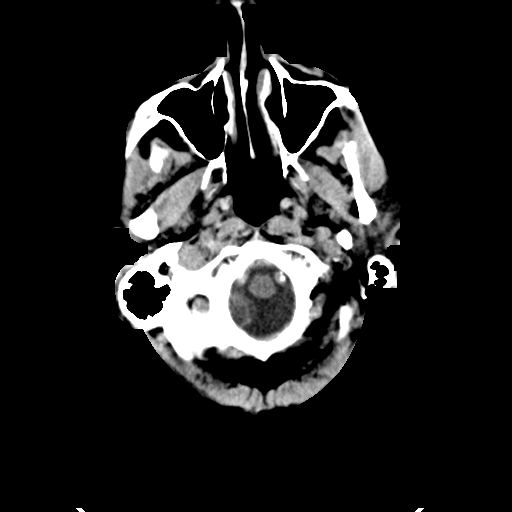
[im 5/35  bone]
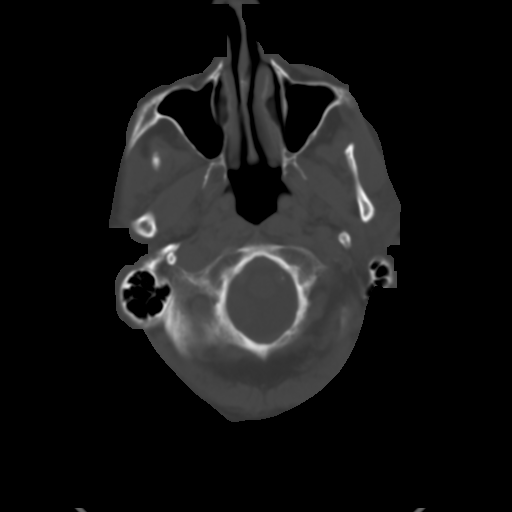
[im 10/35  brain]
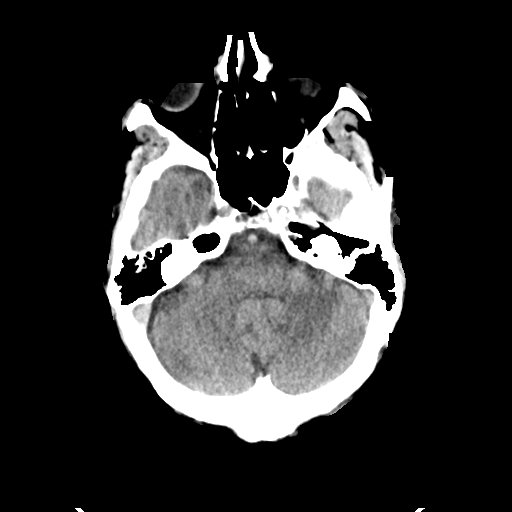
[im 15/35  brain]
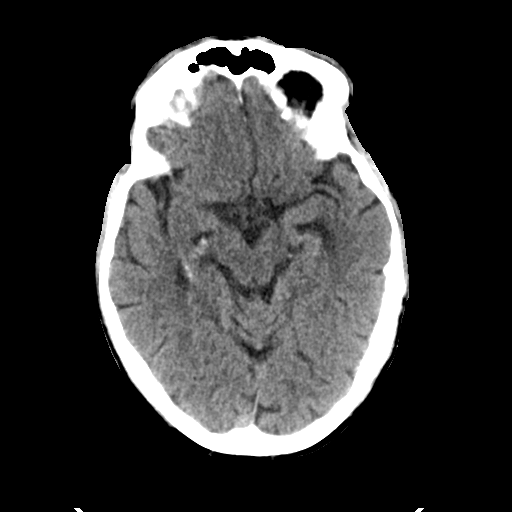
[im 20/35  brain]
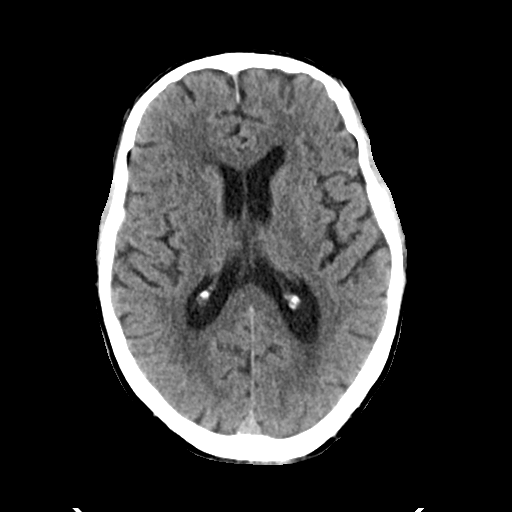
[im 25/35  brain]
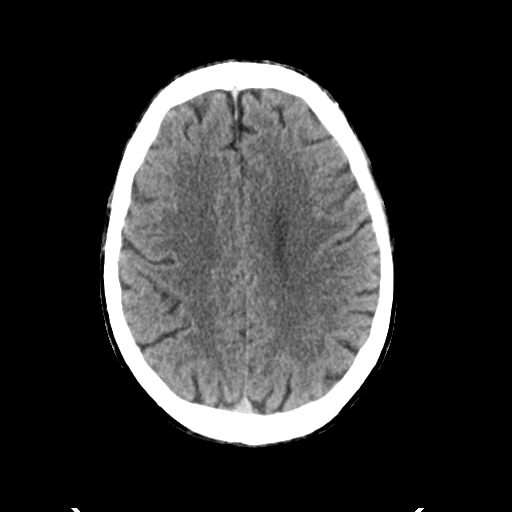
[im 25/35  bone]
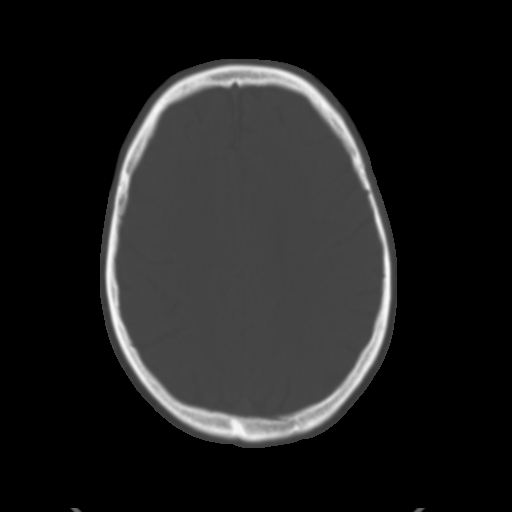
[im 30/35  brain]
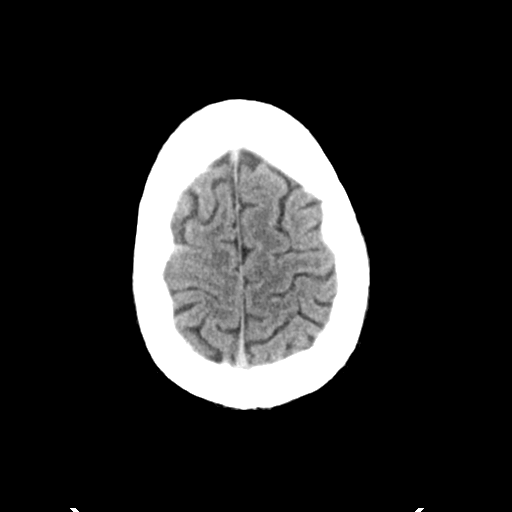

[Series 4: head bone · axial · 0.48mm/px · z∈[-192,-150]mm · 3 of 89 slices shown]
[im 9/89  bone]
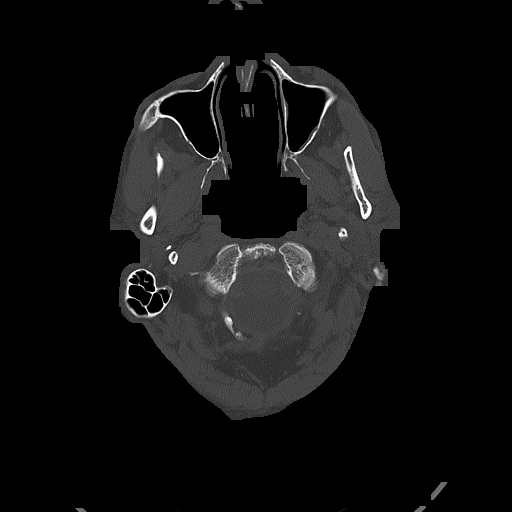
[im 17/89  bone]
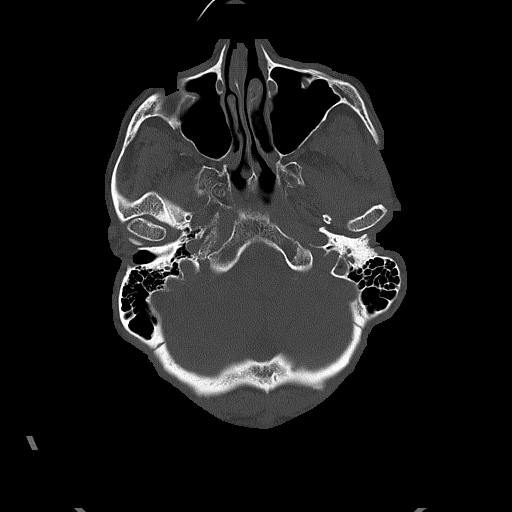
[im 30/89  bone]
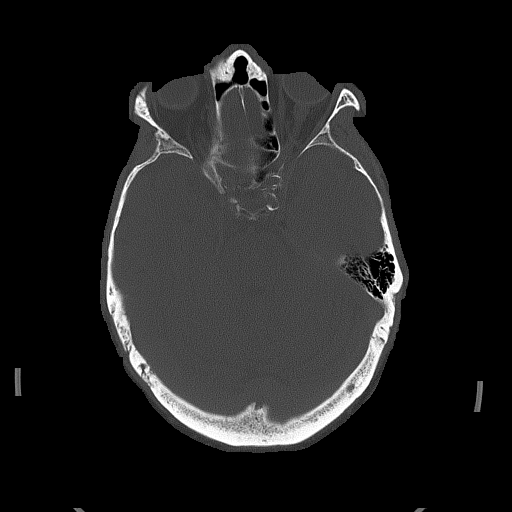

[Series 5: head without cor · coronal · non-contrast · 0.39mm/px · 3 of 88 slices shown]
[im 30/88  brain]
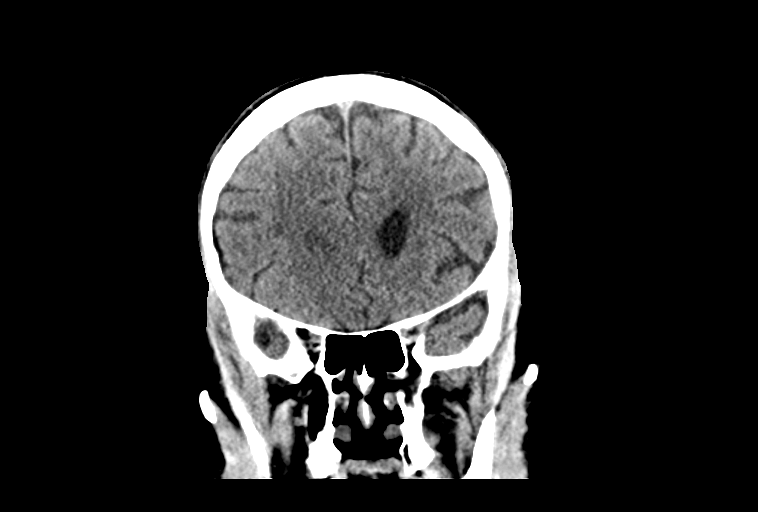
[im 39/88  brain]
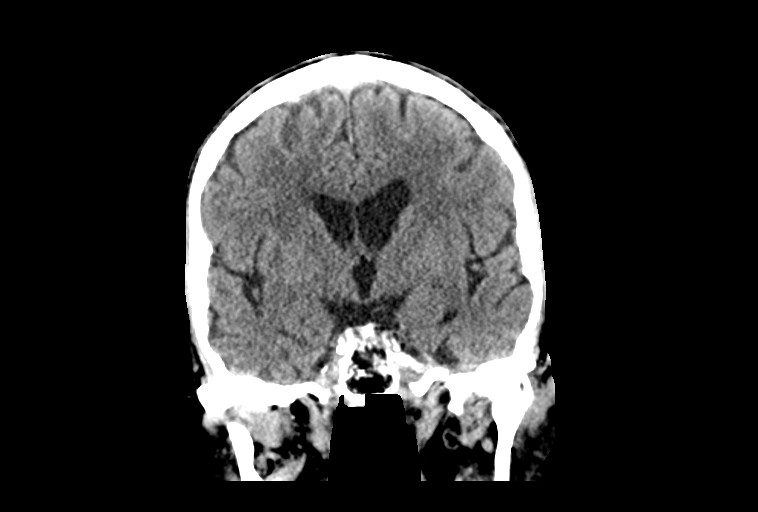
[im 49/88  brain]
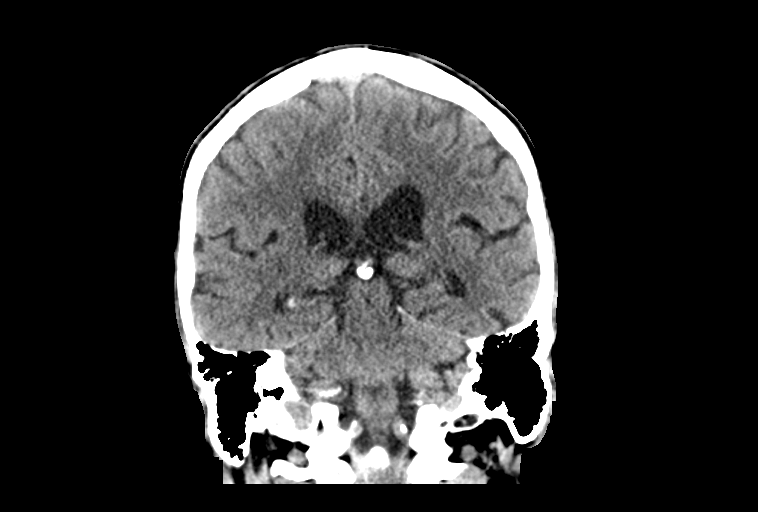

[Series 6: head without sag · sagittal · non-contrast · 0.41mm/px · 3 of 62 slices shown]
[im 21/62  brain]
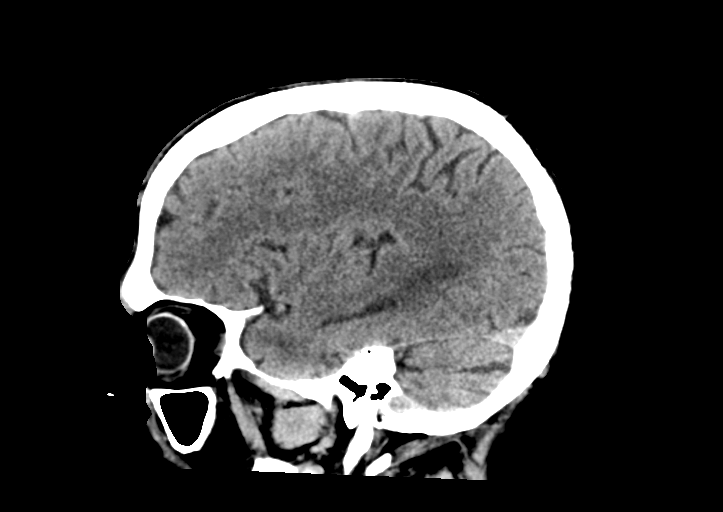
[im 31/62  brain]
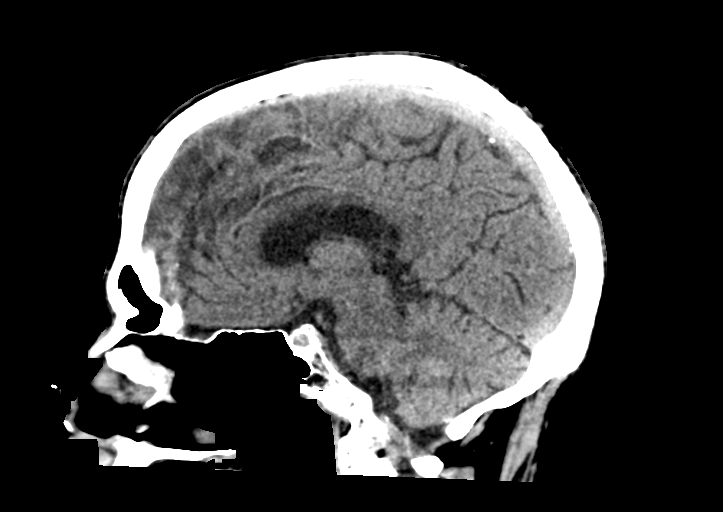
[im 41/62  brain]
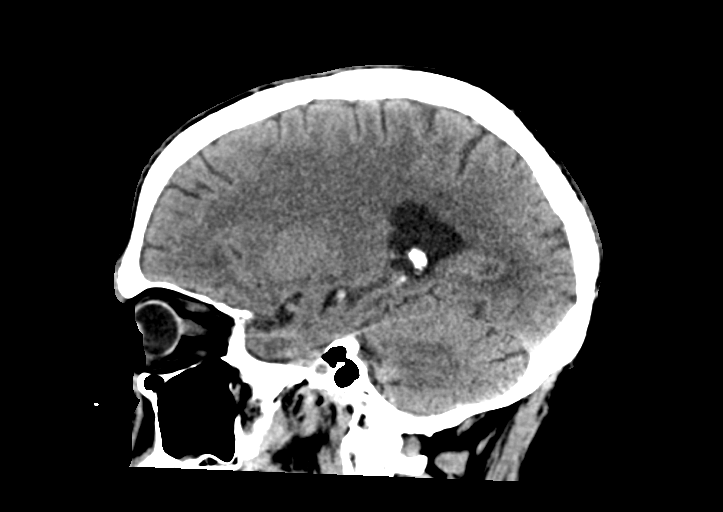

[15 of 47 positions shown; findings below may reference images not displayed]

FINDINGS: Brain: The ventricles and sulci appropriate size for patient's age.
Minimal periventricular and deep white matter chronic microvascular
ischemic changes. There is no acute intracranial hemorrhage. No mass
effect or midline shift. No extra-axial fluid collection.

Vascular: No hyperdense vessel or unexpected calcification.

Skull: Normal. Negative for fracture or focal lesion.

Sinuses/Orbits: No acute finding.

Other: None
IMPRESSION: No acute intracranial pathology.

## 2021-08-29 ENCOUNTER — Ambulatory Visit: Payer: Medicare HMO

## 2021-08-29 ENCOUNTER — Other Ambulatory Visit: Payer: Self-pay

## 2021-08-29 DIAGNOSIS — S2241XA Multiple fractures of ribs, right side, initial encounter for closed fracture: Secondary | ICD-10-CM | POA: Diagnosis not present

## 2021-08-29 DIAGNOSIS — R2681 Unsteadiness on feet: Secondary | ICD-10-CM

## 2021-08-29 DIAGNOSIS — R262 Difficulty in walking, not elsewhere classified: Secondary | ICD-10-CM | POA: Diagnosis not present

## 2021-08-29 DIAGNOSIS — R42 Dizziness and giddiness: Secondary | ICD-10-CM | POA: Diagnosis not present

## 2021-08-29 DIAGNOSIS — M6281 Muscle weakness (generalized): Secondary | ICD-10-CM | POA: Diagnosis not present

## 2021-08-29 DIAGNOSIS — R55 Syncope and collapse: Secondary | ICD-10-CM | POA: Diagnosis not present

## 2021-08-29 DIAGNOSIS — R296 Repeated falls: Secondary | ICD-10-CM | POA: Diagnosis not present

## 2021-08-29 DIAGNOSIS — T1490XA Injury, unspecified, initial encounter: Secondary | ICD-10-CM | POA: Diagnosis not present

## 2021-08-29 NOTE — Therapy (Signed)
[facility] [address] [place], [place], [address] Phone: [phone]   Fax:  [phone]  Physical Therapy Treatment  Patient Details  Name: Joshua Hess MRN: [id number] Date of Birth: 1940-07-20 Referring Provider (PT): Barb Merino, MD   Encounter Date: 08/29/2021   PT End of Session - 08/29/21 1016     Visit Number 4    Number of Visits 17    Date for PT Re-Evaluation 10/15/21    Authorization Type [employer]    Progress Note Due on Visit 10    PT Start Time 1016    PT Stop Time 1100    PT Time Calculation (min) 44 min    Activity Tolerance Patient tolerated treatment well    Behavior During Therapy [facility] for tasks assessed/performed;Impulsive             Past Medical History:  Diagnosis Date   Aphasia    Cancer [facility])    behind ear- [place]   Carotid artery stenosis    Chronotropic incompetence    ED (erectile dysfunction)    Elevated PSA    HOH (hard of hearing)    Mild CAD    Plantar fasciitis    Postoperative atrial fibrillation (HCC)    Severe aortic stenosis    a. s/p tissue AVR 10/2018.   Sinus bradycardia    baseline   Stroke ([place]) 03/05/2020   TIA   Wears glasses     Past Surgical History:  Procedure Laterality Date   AORTIC VALVE REPLACEMENT N/A 11/10/2018   Procedure: AORTIC VALVE REPLACEMENT (AVR), USING 23MM INSPIRIS;  Surgeon: Gaye Pollack, MD;  Location: [place];  Service: Open Heart Surgery;  Laterality: N/A;   ENDARTERECTOMY Left 03/08/2020   Procedure: ENDARTERECTOMY CAROTID;  Surgeon: Rosetta Posner, MD;  Location: [place];  Service: Vascular;  Laterality: Left;   EXTERNAL EAR SURGERY     melanoma removed   HERNIA REPAIR Right    inguinal   INGUINAL HERNIA REPAIR Right 11/15/2019   Procedure: LAPAROSCOPIC RIGHT INGUINAL HERNIA REPAIR WITH MESH;  Surgeon: Coralie Keens, MD;  Location: [place];  Service: General;  Laterality: Right;   RIGHT/LEFT HEART CATH AND CORONARY  ANGIOGRAPHY N/A 09/29/2018   Procedure: RIGHT/LEFT HEART CATH AND CORONARY ANGIOGRAPHY;  Surgeon: Burnell Blanks, MD;  Location: [place] CV LAB;  Service: Cardiovascular;  Laterality: N/A;   TEE WITHOUT CARDIOVERSION N/A 11/10/2018   Procedure: TRANSESOPHAGEAL ECHOCARDIOGRAM (TEE);  Surgeon: Gaye Pollack, MD;  Location: [place];  Service: Open Heart Surgery;  Laterality: N/A;   TRANSCATHETER AORTIC VALVE REPLACEMENT, TRANSFEMORAL     PROCEDURE ATTEMPTED & ABORTED    UMBILICAL HERNIA REPAIR N/A 11/15/2019   Procedure: UMBILICAL HERNIA REPAIR;  Surgeon: Coralie Keens, MD;  Location: [place];  Service: General;  Laterality: N/A;    There were no vitals filed for this visit.   Subjective Assessment - 08/29/21 1021     Subjective No change appreciated per pt report and feels his right hand tremor is worsening. Session begain with vitals monitoring and demo 130/93 mmHg 63 bpm    Patient is accompained by: Family member   husband providing majority of subjective report   Pertinent History aortic stenosis status post aortic valve replacement, coronary artery disease status post endarterectomy, essential tremors, depression and anxiety, HOH, sinus bradycardia, stroke 2021    Limitations Lifting;Standing;Walking;House hold activities    Diagnostic tests none recent    Patient Stated  Goals improve balance                     Vestibular Assessment - 08/29/21 0001       Orthostatics   BP sitting 130/93    HR sitting 63                      [facility] Adult PT Treatment/Exercise - 08/29/21 0001       Lumbar Exercises: Aerobic   Recumbent Bike iso-strength 50 rpm x 6 min to assess for HR/BP changes with effort             Vestibular Treatment/Exercise - 08/29/21 0001       Vestibular Treatment/Exercise   Vestibular Treatment Provided Gaze    Habituation Exercises Standing Horizontal Head Turns    Gaze Exercises X1 Viewing Horizontal;X1 Viewing Vertical       Standing Horizontal Head Turns   Number of Reps  30    Symptom Description  unsteadiness, catch-up saccades with right rotation      Standing Diagonal Head Turns   Number of Reps  10      180 degree Turns   Number of Reps  10      X1 Viewing Horizontal   Foot Position apart on foam    Time --   30 sec   Reps 3      X1 Viewing Vertical   Foot Position apart on foam    Time --   3x30 sec   Reps 2                Balance Exercises - 08/29/21 0001       Balance Exercises: Standing   Standing Eyes Opened Foam/compliant surface;Narrow base of support (BOS);Head turns    Standing Eyes Closed Foam/compliant surface;Narrow base of support (BOS);5 reps;10 secs    Tandem Stance Eyes open;2 reps;30 secs    Rockerboard Lateral;Other (comment)   2x60 sec                 PT Short Term Goals - 08/20/21 1554       PT SHORT TERM GOAL #1   Title Patient to perform initial HEP with family assistance.    Time 3    Period Weeks    Status New    Target Date 09/10/21               PT Long Term Goals - 08/20/21 1555       PT LONG TERM GOAL #1   Title Patient to perform advanced HEP with family assistance.    Time 8    Period Weeks    Status New    Target Date 10/15/21      PT LONG TERM GOAL #2   Title Patient to score at least 18/24 on DGI in order to decrease risk of falls.    Time 8    Period Weeks    Status New    Target Date 10/15/21      PT LONG TERM GOAL #3   Title Patient to report 0/10 dizziness with standing vertical and horizontal head movements at variable speeds.    Time 8    Period Weeks    Status New    Target Date 10/15/21      PT LONG TERM GOAL #4   Title Patient to report no falls in the past 3 weeks.    Time 8    Period  Weeks    Status New    Target Date 10/15/21      PT LONG TERM GOAL #5   Title Patient will ambulate over outdoor surfaces with LRAD while performing head turns to scan environment with good stability in  order to indicate safe community mobility.    Time 8    Period Weeks    Status New    Target Date 10/15/21      Additional Long Term Goals   Additional Long Term Goals Yes                   Plan - 08/29/21 1107     Clinical Impression Statement Demo difficulty with VOR with head rotation with catch-up saccades manifest when rotating head to the right and demonstrates poor endurance with activity demonstrating pronounced fatigue after 15-30 sec. Unsteadiness more pronounced with transitioning sit to stand and when forward bending resulting in anterior LOB. Difficult in ascertaining central vs peripheral issue with his balance.    Personal Factors and Comorbidities Comorbidity 3+;Age;Fitness;Past/Current Experience;Time since onset of injury/illness/exacerbation    Comorbidities aortic stenosis status post aortic valve replacement, coronary artery disease status post endarterectomy, essential tremors, depression and anxiety, HOH, sinus bradycardia, stroke 2021    Examination-Activity Limitations Locomotion Level;Transfers;Bed Mobility;Reach Overhead;Bathing;Bend;Carry;Squat;Stairs;Dressing;Hygiene/Grooming;Stand;Lift    Examination-Participation Restrictions Community Activity;Yard Work;Laundry;Shop;Cleaning;Church;Meal Prep    Stability/Clinical Decision Making Evolving/Moderate complexity    Rehab Potential Good    PT Frequency 2x / week    PT Duration 8 weeks    PT Treatment/Interventions ADLs/Self Care Home Management;Canalith Repostioning;Gait training;Stair training;Functional mobility training;Therapeutic activities;Therapeutic exercise;Balance training;Neuromuscular re-education;Patient/family education;Visual/perceptual remediation/compensation;Vestibular;Cryotherapy;DME Instruction;Moist Heat;Manual techniques;Orthotic Fit/Training;Passive range of motion;Dry needling;Energy conservation;Taping    PT Next Visit Plan reassess vestibular    PT Home Exercise Plan hip flexion  (high knees) at counter then fast feet 3x30 sec. STanding balance with LE elevated in cabinet for 5-10 reps 15 sec. VOR x 1    Consulted and Agree with Plan of Care Patient;Family member/caregiver    Family Member Consulted spouse             Patient will benefit from skilled therapeutic intervention in order to improve the following deficits and impairments:  Abnormal gait, Difficulty walking, Decreased balance, Decreased strength, Decreased range of motion, Decreased safety awareness, Dizziness, Decreased activity tolerance, Impaired flexibility, Postural dysfunction  Visit Diagnosis: Repeated falls  Unsteadiness on feet  Dizziness and giddiness  Muscle weakness (generalized)  Difficulty in walking, not elsewhere classified     Problem List Patient Active Problem List   Diagnosis Date Noted   Rib fracture 08/09/2021   Syncope 08/08/2021   Rib fractures 08/08/2021   Pain due to onychomycosis of toenails of both feet 04/30/2021   Cognitive impairment 11/11/2020   Depression with anxiety 11/11/2020   Carotid artery disease ([place]) 11/11/2020   Essential tremor 11/11/2020   Bunionette of left foot 06/20/2020   Fatigue 06/20/2020   History of left-sided carotid endarterectomy 05/27/2020   Facial droop 03/05/2020   Mild CAD    Acute encephalopathy 11/18/2019   Acute urinary retention 11/18/2019   Post-operative state 11/18/2019   Aphasia 08/07/2019   S/P AVR (aortic valve replacement)    Carotid artery stenosis    Chronotropic incompetence    Severe aortic stenosis    Sinus bradycardia 08/12/2018   Malignant melanoma of scalp ([place]) 09/22/2015   Benign prostatic hyperplasia with nocturia 11/01/2012   Enlarged prostate with lower urinary tract symptoms (LUTS) 11/01/2012  Toniann Fail, PT 08/29/2021, 11:17 AM  [facility] [address] [place], [place], [address] Phone: [phone]   Fax:  [phone]  Name:  Joshua Hess MRN: [id number] Date of Birth: June 05, 1941

## 2021-09-01 ENCOUNTER — Ambulatory Visit: Payer: Medicare HMO

## 2021-09-01 ENCOUNTER — Other Ambulatory Visit: Payer: Self-pay

## 2021-09-01 DIAGNOSIS — T1490XA Injury, unspecified, initial encounter: Secondary | ICD-10-CM | POA: Diagnosis not present

## 2021-09-01 DIAGNOSIS — R262 Difficulty in walking, not elsewhere classified: Secondary | ICD-10-CM | POA: Diagnosis not present

## 2021-09-01 DIAGNOSIS — R296 Repeated falls: Secondary | ICD-10-CM

## 2021-09-01 DIAGNOSIS — R42 Dizziness and giddiness: Secondary | ICD-10-CM

## 2021-09-01 DIAGNOSIS — R2681 Unsteadiness on feet: Secondary | ICD-10-CM | POA: Diagnosis not present

## 2021-09-01 DIAGNOSIS — M6281 Muscle weakness (generalized): Secondary | ICD-10-CM

## 2021-09-01 DIAGNOSIS — S2241XA Multiple fractures of ribs, right side, initial encounter for closed fracture: Secondary | ICD-10-CM | POA: Diagnosis not present

## 2021-09-01 DIAGNOSIS — R55 Syncope and collapse: Secondary | ICD-10-CM | POA: Diagnosis not present

## 2021-09-01 NOTE — Therapy (Signed)
[facility] [address], [place], [place], [address] Phone: [phone]   Fax:  [phone]  Physical Therapy Treatment  Patient Details  Name: Joshua Hess MRN: [id number] Date of Birth: 24-Jan-1941 Referring Provider (PT): Barb Merino, MD   Encounter Date: 09/01/2021   PT End of Session - 09/01/21 1310     Visit Number 5    Number of Visits 17    Date for PT Re-Evaluation 10/15/21    Authorization Type [employer]    Progress Note Due on Visit 10    PT Start Time 1315    PT Stop Time 1400    PT Time Calculation (min) 45 min    Activity Tolerance Patient tolerated treatment well    Behavior During Therapy [facility] for tasks assessed/performed;Impulsive             Past Medical History:  Diagnosis Date   Aphasia    Cancer [facility])    behind ear- [place]   Carotid artery stenosis    Chronotropic incompetence    ED (erectile dysfunction)    Elevated PSA    HOH (hard of hearing)    Mild CAD    Plantar fasciitis    Postoperative atrial fibrillation (HCC)    Severe aortic stenosis    a. s/p tissue AVR 10/2018.   Sinus bradycardia    baseline   Stroke ([place]) 03/05/2020   TIA   Wears glasses     Past Surgical History:  Procedure Laterality Date   AORTIC VALVE REPLACEMENT N/A 11/10/2018   Procedure: AORTIC VALVE REPLACEMENT (AVR), USING 23MM INSPIRIS;  Surgeon: Gaye Pollack, MD;  Location: [place];  Service: Open Heart Surgery;  Laterality: N/A;   ENDARTERECTOMY Left 03/08/2020   Procedure: ENDARTERECTOMY CAROTID;  Surgeon: Rosetta Posner, MD;  Location: [place];  Service: Vascular;  Laterality: Left;   EXTERNAL EAR SURGERY     melanoma removed   HERNIA REPAIR Right    inguinal   INGUINAL HERNIA REPAIR Right 11/15/2019   Procedure: LAPAROSCOPIC RIGHT INGUINAL HERNIA REPAIR WITH MESH;  Surgeon: Coralie Keens, MD;  Location: [place];  Service: General;  Laterality: Right;   RIGHT/LEFT HEART CATH AND CORONARY  ANGIOGRAPHY N/A 09/29/2018   Procedure: RIGHT/LEFT HEART CATH AND CORONARY ANGIOGRAPHY;  Surgeon: Burnell Blanks, MD;  Location: [place] CV LAB;  Service: Cardiovascular;  Laterality: N/A;   TEE WITHOUT CARDIOVERSION N/A 11/10/2018   Procedure: TRANSESOPHAGEAL ECHOCARDIOGRAM (TEE);  Surgeon: Gaye Pollack, MD;  Location: [place];  Service: Open Heart Surgery;  Laterality: N/A;   TRANSCATHETER AORTIC VALVE REPLACEMENT, TRANSFEMORAL     PROCEDURE ATTEMPTED & ABORTED    UMBILICAL HERNIA REPAIR N/A 11/15/2019   Procedure: UMBILICAL HERNIA REPAIR;  Surgeon: Coralie Keens, MD;  Location: [place];  Service: General;  Laterality: N/A;    There were no vitals filed for this visit.   Subjective Assessment - 09/01/21 1319     Subjective Pt notes no change in his unsteadiness and notes most symptoms are brought on with sit to stand and bending forward    Patient is accompained by: Family member   husband providing majority of subjective report   Pertinent History aortic stenosis status post aortic valve replacement, coronary artery disease status post endarterectomy, essential tremors, depression and anxiety, HOH, sinus bradycardia, stroke 2021    Limitations Lifting;Standing;Walking;House hold activities    Diagnostic tests none recent    Patient Stated Goals improve balance  Vestibular Assessment - 09/01/21 0001       Symptom Behavior   Subjective history of current problem pt notes constant feeling of dizziness, mainly reports when transitioning to standing and initiating walking    Type of Dizziness  Imbalance;"Funny feeling in head";"Horizon moves"    Frequency of Dizziness constant    Duration of Dizziness constant    Symptom Nature Constant    Aggravating Factors Activity in general    Relieving Factors Closing eyes;Dark room;Medication    Progression of Symptoms No change since onset      Oculomotor Exam   Oculomotor Alignment Normal     Smooth Pursuits Intact    Saccades Intact      Vestibulo-Ocular Reflex   VOR Cancellation Normal      Positional Testing   Dix-Hallpike Dix-Hallpike Right;Dix-Hallpike Left    Horizontal Canal Testing Horizontal Canal Right;Horizontal Canal Left      Dix-Hallpike Right   Dix-Hallpike Right Duration none    Dix-Hallpike Right Symptoms No nystagmus      Dix-Hallpike Left   Dix-Hallpike Left Duration none    Dix-Hallpike Left Symptoms No nystagmus      Horizontal Canal Right   Horizontal Canal Right Duration none    Horizontal Canal Right Symptoms Normal      Horizontal Canal Left   Horizontal Canal Left Duration none    Horizontal Canal Left Symptoms Normal      Positional Sensitivities   Sit to Supine No dizziness    Supine to Sitting Mild dizziness    Head Turning x 5 Mild dizziness      Orthostatics   BP sitting 137/93                      [facility] Adult PT Treatment/Exercise - 09/01/21 0001       Ambulation/Gait   Ambulation/Gait Yes    Ambulation Surface Level;Grass;Paved;Outdoor;Indoor    Gait velocity - backwards 3x25 ft    Gait Comments gait trials performed outside with pt observed stepping over/down changes in surfaces possibly due to depth perception deficits. Trials in fast walking on level paved surfaces 3x30 ft at CGA. Walking on grass and uneven terrain with CGA x 100 ft. Trials in jogging 2 trials x 30 ft. Walking on toes, walking on heels x 30 ft.      Knee/Hip Exercises: Standing   Other Standing Knee Exercises hip flexion with red t-loop around feet 2x10 reps                       PT Short Term Goals - 08/20/21 1554       PT SHORT TERM GOAL #1   Title Patient to perform initial HEP with family assistance.    Time 3    Period Weeks    Status New    Target Date 09/10/21               PT Long Term Goals - 08/20/21 1555       PT LONG TERM GOAL #1   Title Patient to perform advanced HEP with family assistance.     Time 8    Period Weeks    Status New    Target Date 10/15/21      PT LONG TERM GOAL #2   Title Patient to score at least 18/24 on DGI in order to decrease risk of falls.    Time 8    Period Weeks  Status New    Target Date 10/15/21      PT LONG TERM GOAL #3   Title Patient to report 0/10 dizziness with standing vertical and horizontal head movements at variable speeds.    Time 8    Period Weeks    Status New    Target Date 10/15/21      PT LONG TERM GOAL #4   Title Patient to report no falls in the past 3 weeks.    Time 8    Period Weeks    Status New    Target Date 10/15/21      PT LONG TERM GOAL #5   Title Patient will ambulate over outdoor surfaces with LRAD while performing head turns to scan environment with good stability in order to indicate safe community mobility.    Time 8    Period Weeks    Status New    Target Date 10/15/21      Additional Long Term Goals   Additional Long Term Goals Yes                   Plan - 09/01/21 1550     Clinical Impression Statement Completed positional vertigo tests without provocation of symptoms. Patient has dizziness more manifest with positional/movement changes e.g. when sitting to standing and rates this as constant even when sitting at times.  Tends to exhibit unsteadiness right > left and demonstrates more pronounced coordinatoin deficits affecting RUE/RLE than the left. Unable to maintain right ankle dorsiflexion when heel-walking and when performing high-knee march patient tends to allow right ankle to plantarflex vs dorsiflexion of left ankle. Implemented HEP of standing march/hip flexion with theraloop around feet to stimulate/correct this deficit. Dyscoordination during fast walking trials with decreased foot clearance right foot more prominent. Pt grades dizziness as ever-present and perceives as mild dizziness which was unchanged despite position and activity intensity    Personal Factors and Comorbidities  Comorbidity 3+;Age;Fitness;Past/Current Experience;Time since onset of injury/illness/exacerbation    Comorbidities aortic stenosis status post aortic valve replacement, coronary artery disease status post endarterectomy, essential tremors, depression and anxiety, HOH, sinus bradycardia, stroke 2021    Examination-Activity Limitations Locomotion Level;Transfers;Bed Mobility;Reach Overhead;Bathing;Bend;Carry;Squat;Stairs;Dressing;Hygiene/Grooming;Stand;Lift    Examination-Participation Restrictions Community Activity;Yard Work;Laundry;Shop;Cleaning;Church;Meal Prep    Stability/Clinical Decision Making Evolving/Moderate complexity    Rehab Potential Good    PT Frequency 2x / week    PT Duration 8 weeks    PT Treatment/Interventions ADLs/Self Care Home Management;Canalith Repostioning;Gait training;Stair training;Functional mobility training;Therapeutic activities;Therapeutic exercise;Balance training;Neuromuscular re-education;Patient/family education;Visual/perceptual remediation/compensation;Vestibular;Cryotherapy;DME Instruction;Moist Heat;Manual techniques;Orthotic Fit/Training;Passive range of motion;Dry needling;Energy conservation;Taping    PT Next Visit Plan reassess vestibular    PT Home Exercise Plan hip flexion (high knees) at counter then fast feet 3x30 sec. STanding balance with LE elevated in cabinet for 5-10 reps 15 sec. VOR x 1. Standing hip flexion with red t-loop 3x10    Consulted and Agree with Plan of Care Patient;Family member/caregiver    Family Member Consulted spouse             Patient will benefit from skilled therapeutic intervention in order to improve the following deficits and impairments:  Abnormal gait, Difficulty walking, Decreased balance, Decreased strength, Decreased range of motion, Decreased safety awareness, Dizziness, Decreased activity tolerance, Impaired flexibility, Postural dysfunction  Visit Diagnosis: Repeated falls  Unsteadiness on  feet  Dizziness and giddiness  Muscle weakness (generalized)  Difficulty in walking, not elsewhere classified     Problem List Patient Active Problem List  Diagnosis Date Noted   Rib fracture 08/09/2021   Syncope 08/08/2021   Rib fractures 08/08/2021   Pain due to onychomycosis of toenails of both feet 04/30/2021   Cognitive impairment 11/11/2020   Depression with anxiety 11/11/2020   Carotid artery disease ([place]) 11/11/2020   Essential tremor 11/11/2020   Bunionette of left foot 06/20/2020   Fatigue 06/20/2020   History of left-sided carotid endarterectomy 05/27/2020   Facial droop 03/05/2020   Mild CAD    Acute encephalopathy 11/18/2019   Acute urinary retention 11/18/2019   Post-operative state 11/18/2019   Aphasia 08/07/2019   S/P AVR (aortic valve replacement)    Carotid artery stenosis    Chronotropic incompetence    Severe aortic stenosis    Sinus bradycardia 08/12/2018   Malignant melanoma of scalp ([place]) 09/22/2015   Benign prostatic hyperplasia with nocturia 11/01/2012   Enlarged prostate with lower urinary tract symptoms (LUTS) 11/01/2012    Toniann Fail, PT 09/01/2021, 4:03 PM  [facility] [address], [place], [place], [address] Phone: [phone]   Fax:  [phone]  Name: CHARLESTON HANKIN MRN: [id number] Date of Birth: January 03, 1941

## 2021-09-02 ENCOUNTER — Ambulatory Visit: Payer: Medicare HMO

## 2021-09-02 DIAGNOSIS — F419 Anxiety disorder, unspecified: Secondary | ICD-10-CM | POA: Diagnosis not present

## 2021-09-02 DIAGNOSIS — F5104 Psychophysiologic insomnia: Secondary | ICD-10-CM | POA: Diagnosis not present

## 2021-09-05 ENCOUNTER — Ambulatory Visit: Payer: Medicare HMO

## 2021-09-07 ENCOUNTER — Encounter (HOSPITAL_COMMUNITY): Payer: Self-pay | Admitting: Emergency Medicine

## 2021-09-07 ENCOUNTER — Emergency Department (HOSPITAL_COMMUNITY): Payer: Medicare HMO

## 2021-09-07 ENCOUNTER — Other Ambulatory Visit: Payer: Self-pay

## 2021-09-07 ENCOUNTER — Emergency Department (HOSPITAL_COMMUNITY)
Admission: EM | Admit: 2021-09-07 | Discharge: 2021-09-07 | Disposition: A | Discharge status: Home / Self Care | Payer: Medicare HMO | Attending: Emergency Medicine | Admitting: Emergency Medicine

## 2021-09-07 DIAGNOSIS — R531 Weakness: Secondary | ICD-10-CM

## 2021-09-07 DIAGNOSIS — Z7982 Long term (current) use of aspirin: Secondary | ICD-10-CM | POA: Diagnosis not present

## 2021-09-07 DIAGNOSIS — R269 Unspecified abnormalities of gait and mobility: Secondary | ICD-10-CM | POA: Diagnosis not present

## 2021-09-07 DIAGNOSIS — R001 Bradycardia, unspecified: Secondary | ICD-10-CM | POA: Diagnosis not present

## 2021-09-07 DIAGNOSIS — I2510 Atherosclerotic heart disease of native coronary artery without angina pectoris: Secondary | ICD-10-CM | POA: Diagnosis not present

## 2021-09-07 DIAGNOSIS — R29818 Other symptoms and signs involving the nervous system: Secondary | ICD-10-CM | POA: Diagnosis not present

## 2021-09-07 DIAGNOSIS — G319 Degenerative disease of nervous system, unspecified: Secondary | ICD-10-CM | POA: Diagnosis not present

## 2021-09-07 DIAGNOSIS — R2689 Other abnormalities of gait and mobility: Secondary | ICD-10-CM | POA: Diagnosis not present

## 2021-09-07 DIAGNOSIS — R011 Cardiac murmur, unspecified: Secondary | ICD-10-CM | POA: Diagnosis not present

## 2021-09-07 DIAGNOSIS — Z20822 Contact with and (suspected) exposure to covid-19: Secondary | ICD-10-CM | POA: Insufficient documentation

## 2021-09-07 LAB — CBC WITH DIFFERENTIAL/PLATELET
Abs Immature Granulocytes: 0.02 10*3/uL (ref 0.00–0.07)
Basophils Absolute: 0 10*3/uL (ref 0.0–0.1)
Basophils Relative: 0 %
Eosinophils Absolute: 0.1 10*3/uL (ref 0.0–0.5)
Eosinophils Relative: 1 %
HCT: 42.9 % (ref 39.0–52.0)
Hemoglobin: 15.1 g/dL (ref 13.0–17.0)
Immature Granulocytes: 0 %
Lymphocytes Relative: 44 %
Lymphs Abs: 4 10*3/uL (ref 0.7–4.0)
MCH: 33.8 pg (ref 26.0–34.0)
MCHC: 35.2 g/dL (ref 30.0–36.0)
MCV: 96 fL (ref 80.0–100.0)
Monocytes Absolute: 0.9 10*3/uL (ref 0.1–1.0)
Monocytes Relative: 9 %
Neutro Abs: 4.2 10*3/uL (ref 1.7–7.7)
Neutrophils Relative %: 46 %
Platelets: 170 10*3/uL (ref 150–400)
RBC: 4.47 MIL/uL (ref 4.22–5.81)
RDW: 11.8 % (ref 11.5–15.5)
WBC: 9.2 10*3/uL (ref 4.0–10.5)
nRBC: 0 % (ref 0.0–0.2)

## 2021-09-07 LAB — URINALYSIS, ROUTINE W REFLEX MICROSCOPIC
Bilirubin Urine: NEGATIVE
Glucose, UA: NEGATIVE mg/dL
Hgb urine dipstick: NEGATIVE
Ketones, ur: 20 mg/dL — AB
Leukocytes,Ua: NEGATIVE
Nitrite: NEGATIVE
Protein, ur: NEGATIVE mg/dL
Specific Gravity, Urine: 1.017 (ref 1.005–1.030)
pH: 8 (ref 5.0–8.0)

## 2021-09-07 LAB — COMPREHENSIVE METABOLIC PANEL
ALT: 21 U/L (ref 0–44)
AST: 26 U/L (ref 15–41)
Albumin: 4.1 g/dL (ref 3.5–5.0)
Alkaline Phosphatase: 61 U/L (ref 38–126)
Anion gap: 12 (ref 5–15)
BUN: 23 mg/dL (ref 8–23)
CO2: 23 mmol/L (ref 22–32)
Calcium: 9.1 mg/dL (ref 8.9–10.3)
Chloride: 103 mmol/L (ref 98–111)
Creatinine, Ser: 1.03 mg/dL (ref 0.61–1.24)
GFR, Estimated: >60 mL/min (ref >60)
Glucose, Bld: 102 mg/dL — ABNORMAL HIGH (ref 70–99)
Potassium: 3.9 mmol/L (ref 3.5–5.1)
Sodium: 138 mmol/L (ref 135–145)
Total Bilirubin: 1.4 mg/dL — ABNORMAL HIGH (ref 0.3–1.2)
Total Protein: 7.4 g/dL (ref 6.5–8.1)

## 2021-09-07 LAB — MAGNESIUM: Magnesium: 1.8 mg/dL (ref 1.7–2.4)

## 2021-09-07 LAB — RESP PANEL BY RT-PCR (FLU A&B, COVID) ARPGX2
Influenza A by PCR: NEGATIVE
Influenza B by PCR: NEGATIVE
SARS Coronavirus 2 by RT PCR: NEGATIVE

## 2021-09-07 LAB — CBG MONITORING, ED: Glucose-Capillary: 91 mg/dL (ref 70–99)

## 2021-09-07 MED ORDER — DIAZEPAM 2 MG PO TABS: 2.0000 mg | ORAL_TABLET | Freq: Once | ORAL | Status: DC

## 2021-09-07 MED ORDER — DIAZEPAM 5 MG/ML IJ SOLN
2.5000 mg | Freq: Once | INTRAMUSCULAR | Status: AC
  Administered 2021-09-07: 2.5 mg via INTRAVENOUS
  Filled 2021-09-07: qty 2

## 2021-09-07 MED ORDER — ALPRAZOLAM 0.25 MG PO TABS
1.0000 mg | ORAL_TABLET | Freq: Once | ORAL | Status: AC
  Administered 2021-09-07: 1 mg via ORAL
  Filled 2021-09-07: qty 4

## 2021-09-07 NOTE — ED Provider Notes (Signed)
[place] EMERGENCY DEPARTMENT Provider Note   CSN: [id number] Arrival date & time: 09/07/21  1615     History No chief complaint on file.   Joshua Hess is a 81 y.o. male with a past medical history of CAD, aortic stenosis s/p AVR, bradycardia and multiple falls presenting today with his husband due to increased tremors and anxiety.  Patient's husband notes that he has been becoming increasingly anxious and that there has been a cognitive decline since he had a severe fall 08/08/21.  At that time, patient was worked up for syncope.  There were no acute findings and patient was placed in rehab for his balance difficulties.  This past Friday husband states that he was unable to get up or stand on his own for around an hour and ultimately they missed his physical therapy appointment.  Husband reports that sometimes he is able to stand and ambulate with his cane and other times he is unable to stand up on his own.  They have been is unable to predict when he will be able to walk and when he will not.  On 3/7 patient has an appointment with a new neurologist to discuss tremor and apparent cognitive decline.  He reports that the patient has begun to have memory problems that seem to worsen throughout the day.  Home Medications Prior to Admission medications   Medication Sig Start Date End Date Taking? Authorizing Provider  ALPRAZolam Duanne Moron) 0.5 MG tablet Take 1 tablet by mouth at bedtime as needed for sleep (as directed). 10/23/20   [provider]  amoxicillin (AMOXIL) 500 MG capsule TAKE 4 CAPSULES BY MOUTH 1-2 HOURS PRIOR TO DENTAL APPOINTMENT 07/18/21   Burnell Blanks, MD  Apoaequorin 10 MG CAPS Take 10 mg by mouth daily.    [provider]  aspirin EC 81 MG EC tablet Take 1 tablet (81 mg total) by mouth daily. Swallow whole. 03/10/20   Sheikh, Omair Latif, DO  escitalopram (LEXAPRO) 20 MG tablet Take 1 tablet by mouth daily. 03/22/21   [provider]  fluoruracil (CARAC) 0.5 % cream as directed.    [provider]  magnesium hydroxide (MILK OF MAGNESIA) 400 MG/5ML suspension Take 30 mLs by mouth daily as needed for mild constipation.    [provider]  oxyCODONE (OXY IR/ROXICODONE) 5 MG immediate release tablet Take 1 tablet (5 mg total) by mouth every 6 (six) hours as needed for severe pain. Patient not taking: Reported on 08/20/2021 08/09/21   Johnathan Hausen, MD  polyethylene glycol (MIRALAX / GLYCOLAX) 17 g packet Take 17 g by mouth daily. 11/22/19   Kayleen Memos, DO  traZODone (DESYREL) 50 MG tablet Take 3 tablets by mouth at bedtime. 05/28/21   [provider]  ZYPITAMAG 4 MG TABS TAKE 1 TABLET BY MOUTH DAILY. Patient taking differently: Take 1 tablet by mouth daily. 04/14/21   Gregor Hams, MD      Allergies    Flomax [tamsulosin]    Review of Systems   Review of Systems See HPI  Physical Exam Updated Vital Signs BP (!) 153/83 (BP Location: Left Arm)    Pulse 70    Temp 97.9 F (36.6 C) (Oral)    Resp 18    SpO2 100%  Physical Exam Vitals and nursing note reviewed.  Constitutional:      General: He is not in acute distress.    Appearance: Normal appearance. He is not ill-appearing.  HENT:  Head: Normocephalic and atraumatic.  Eyes:     General: No scleral icterus.    Conjunctiva/sclera: Conjunctivae normal.     Pupils: Pupils are equal, round, and reactive to light.  Cardiovascular:     Rate and Rhythm: Normal rate and regular rhythm.     Heart sounds: Murmur (Holosystolic) heard.  Pulmonary:     Effort: Pulmonary effort is normal. No respiratory distress.  Skin:    General: Skin is warm and dry.     Findings: No rash.  Neurological:     Mental Status: He is alert.     Motor: No weakness.     Gait: Gait abnormal.     Comments: Alert to self, place and situation.  Unable to state the year.  On original exam, patient was with large amounts of shaking in all  extremities.  After being given Valium and Xanax, patient tremor seemed to improve.  Patient was with difficulty with visual field testing in the lower bilateral fields, husband said this was baseline due to cataracts.  Patient with very poor ability with finger-nose testing.  Heel shin somewhat intact.  Full range of motion of bilateral upper and lower extremities.  4-5 strength to finger grip bilaterally, 5 out of 5 strength in all other motions and joints.  Psychiatric:        Mood and Affect: Mood normal.    ED Results / Procedures / Treatments   Labs (all labs ordered are listed, but only abnormal results are displayed) Labs Reviewed  COMPREHENSIVE METABOLIC PANEL - Abnormal; Notable for the following components:      Result Value   Glucose, Bld 102 (*)    Total Bilirubin 1.4 (*)    All other components within normal limits  URINALYSIS, ROUTINE W REFLEX MICROSCOPIC - Abnormal; Notable for the following components:   APPearance HAZY (*)    Ketones, ur 20 (*)    All other components within normal limits  RESP PANEL BY RT-PCR (FLU A&B, COVID) ARPGX2  CBC WITH DIFFERENTIAL/PLATELET  MAGNESIUM  CBG MONITORING, ED    EKG None  Radiology CT Head Wo Contrast  Result Date: 09/07/2021 CLINICAL DATA:  Neuro deficit, acute, stroke suspected. Trouble walking. Weakness. EXAM: CT HEAD WITHOUT CONTRAST TECHNIQUE: Contiguous axial images were obtained from the base of the skull through the vertex without intravenous contrast. RADIATION DOSE REDUCTION: This exam was performed according to the departmental dose-optimization program which includes automated exposure control, adjustment of the mA and/or kV according to patient size and/or use of iterative reconstruction technique. COMPARISON:  08/08/2021 FINDINGS: Brain: There is atrophy and chronic small vessel disease changes. No acute intracranial abnormality. Specifically, no hemorrhage, hydrocephalus, mass lesion, acute infarction, or  significant intracranial injury. Vascular: No hyperdense vessel or unexpected calcification. Skull: No acute calvarial abnormality. Sinuses/Orbits: No acute findings Other: None IMPRESSION: Atrophy, chronic microvascular disease. No acute intracranial abnormality. Electronically Signed   By: Rolm Baptise M.D.   On: 09/07/2021 18:06    Procedures Procedures  NSR normal rate  Medications Ordered in ED Medications - No data to display  ED Course/ Medical Decision Making/ A&P                           Medical Decision Making Amount and/or Complexity of Data Reviewed Labs: ordered. Radiology: ordered.  Risk Prescription drug management.   Patient presents to the ED for concern of weakness and tremors.  Differential includes but is not limited  to electrolyte abnormality, CVA, intracranial mass, hydrocephalus, panic attack, Parkinson's dementia, Parkinson's disease.   Additional history obtained from internal and external records: I was able to review patient's records from his hospitalization 1 month ago where the patient fell while on a walk outside.  His husband reports that he is having a decline in his physical activity but forgets that he is not physically able to move as quickly as he used to.  Patient was evaluated for physical therapy and has been doing outpatient weekly sessions since discharge.  Next session is scheduled for tomorrow.   Workup:  Lab Tests:  I Ordered and viewed labs.  There are no pertinent results.  2. Imaging Studies ordered:  I ordered and individually viewed patient's head CT and saw no hydrocephalus, hemorrhage or areas of ischemia.  Radiologist noted brain atrophy and microvascular disease.  3. Cardiac Monitoring:  The patient was maintained on a cardiac monitor.  I personally viewed and interpreted the cardiac monitored which showed normal sinus rhythm with a normal rate.  4. Test Considered: MRI.  Patient was able to ambulate and was without  acute changes on his neurologic exam.  Deficits seem to be chronic and due to likely developing Perkinson's dementia.  Family has a follow-up with a new neurologist in 10 days who will likely work-up his cognitive decline.  Treatment:  Medications:  I ordered Valium for anxiety and muscle relaxation.  This helped temporarily however patient became anxious and started to tremor again.  Xanax was given which controlled his symptoms and decrease his agitation.   Dispo:  Problem List / ED Course:  Cognitive decline, tremor, weakness.  I suspect the patient has Parkinson's and that this is the cause of his physical tremor, ataxic gait and memory lapses.  Husband reports that he is asked a previous neurologist about this who said that they did not believe he had Parkinson's.  New neurology appointment is on 3/7 and they will revisit the topic at that appointment.  social determinants of health include: Requiring caregiving of a loved 1   Dispostion:  After the interventions noted above, I reevaluated the patient and found that they have become more relaxed and participatory in history taking.  Denies any symptoms other than the tremor.  Patient had difficulty standing up on his own or ambulating with the nursing team.  I was at the bedside at the time.  Patient's husband was adamant that he is able to move and walk sometimes and asked to help assist him.  Patient was able to stand, shuffle to an outside of the room door and back to the stretcher with husband and RN assistance.  Gait consistent with Parkinson's.  I spoke with the patient's husband privately and told him that I am willing to try and admit him to the hospital due to his decline and inability to maneuver their home consistently.  We discussed that inpatient rehab as well as potential neurology consults could be obtained inpatient.  Husband said that he believes the patient would be better at home and would become less agitated.  I believe  the patient is stable to go home with the close care of his husband.  Return precautions were discussed.  He is thankful for the care and they were both discharged in good condition.  Final Clinical Impression(s) / ED Diagnoses Final diagnoses:  Gait abnormality  Weakness    Rx / DC Orders Results and diagnoses were explained to the patient. Return precautions discussed  in full. Patient had no additional questions and expressed complete understanding.   This chart was dictated using voice recognition software.  Despite best efforts to proofread,  errors can occur which can change the documentation meaning.      Darliss Ridgel 09/07/21 1916    Luna Fuse, MD 09/07/21 [phone]

## 2021-09-07 NOTE — ED Triage Notes (Signed)
Pt checked in for trouble walking and weakness.  Per SORT tech pt's son went to move the car and said symptoms started on Thursday.  Pt appears anxious and states he is here because of tremors to bilateral arms.  Denies pain.  States he feels like he is going to pass out.  Primary RN notified and will get more information when son comes back.

## 2021-09-07 NOTE — ED Notes (Signed)
Returned from CT scan.

## 2021-09-07 NOTE — Discharge Instructions (Addendum)
It is very important that you follow-up with a neurologist for ultimate diagnoses.  If Joshua Hess has any more falls or further concerns with his at home care, follow-up accordingly.  Be sure to keep your primary care provider in the loop. Continue to use the xanax as needed for any anxiety and agitation.

## 2021-09-08 ENCOUNTER — Ambulatory Visit: Payer: Medicare HMO

## 2021-09-08 ENCOUNTER — Encounter: Payer: Self-pay | Admitting: Physical Therapy

## 2021-09-08 ENCOUNTER — Ambulatory Visit: Payer: Medicare HMO | Admitting: Physical Therapy

## 2021-09-08 DIAGNOSIS — R42 Dizziness and giddiness: Secondary | ICD-10-CM

## 2021-09-08 DIAGNOSIS — R262 Difficulty in walking, not elsewhere classified: Secondary | ICD-10-CM

## 2021-09-08 DIAGNOSIS — T1490XA Injury, unspecified, initial encounter: Secondary | ICD-10-CM | POA: Diagnosis not present

## 2021-09-08 DIAGNOSIS — M6281 Muscle weakness (generalized): Secondary | ICD-10-CM

## 2021-09-08 DIAGNOSIS — R296 Repeated falls: Secondary | ICD-10-CM

## 2021-09-08 DIAGNOSIS — R2681 Unsteadiness on feet: Secondary | ICD-10-CM

## 2021-09-08 DIAGNOSIS — R55 Syncope and collapse: Secondary | ICD-10-CM | POA: Diagnosis not present

## 2021-09-08 DIAGNOSIS — S2241XA Multiple fractures of ribs, right side, initial encounter for closed fracture: Secondary | ICD-10-CM | POA: Diagnosis not present

## 2021-09-08 NOTE — Therapy (Signed)
[facility] [address] [place], [place], [address] Phone: [phone]   Fax:  [phone]  Physical Therapy Treatment  Patient Details  Name: Joshua Hess MRN: [id number] Date of Birth: [birth year] Referring Provider (PT): Barb Merino, MD   Encounter Date: 09/08/2021   PT End of Session - 09/08/21 1100     Visit Number 6    Number of Visits 17    Date for PT Re-Evaluation 10/15/21    Authorization Type [employer]    Authorization Time Period [place] approval 16 visits 08/20/2021 to 10/15/2021    Authorization - Visit Number 6    Authorization - Number of Visits 16    Progress Note Due on Visit 10    PT Start Time 1016    PT Stop Time 1059    PT Time Calculation (min) 43 min    Equipment Utilized During Treatment Gait belt    Activity Tolerance Patient tolerated treatment well    Behavior During Therapy [facility] for tasks assessed/performed;Impulsive             Past Medical History:  Diagnosis Date   Aphasia    Cancer [facility])    behind ear- [place]   Carotid artery stenosis    Chronotropic incompetence    ED (erectile dysfunction)    Elevated PSA    HOH (hard of hearing)    Mild CAD    Plantar fasciitis    Postoperative atrial fibrillation (HCC)    Severe aortic stenosis    a. s/p tissue AVR 10/2018.   Sinus bradycardia    baseline   Stroke ([place]) 03/05/2020   TIA   Wears glasses     Past Surgical History:  Procedure Laterality Date   AORTIC VALVE REPLACEMENT N/A 11/10/2018   Procedure: AORTIC VALVE REPLACEMENT (AVR), USING 23MM INSPIRIS;  Surgeon: Gaye Pollack, MD;  Location: [place];  Service: Open Heart Surgery;  Laterality: N/A;   ENDARTERECTOMY Left 03/08/2020   Procedure: ENDARTERECTOMY CAROTID;  Surgeon: Rosetta Posner, MD;  Location: [place];  Service: Vascular;  Laterality: Left;   EXTERNAL EAR SURGERY     melanoma removed   HERNIA REPAIR Right    inguinal   INGUINAL HERNIA REPAIR Right 11/15/2019    Procedure: LAPAROSCOPIC RIGHT INGUINAL HERNIA REPAIR WITH MESH;  Surgeon: Coralie Keens, MD;  Location: [place];  Service: General;  Laterality: Right;   RIGHT/LEFT HEART CATH AND CORONARY ANGIOGRAPHY N/A 09/29/2018   Procedure: RIGHT/LEFT HEART CATH AND CORONARY ANGIOGRAPHY;  Surgeon: Burnell Blanks, MD;  Location: [place] CV LAB;  Service: Cardiovascular;  Laterality: N/A;   TEE WITHOUT CARDIOVERSION N/A 11/10/2018   Procedure: TRANSESOPHAGEAL ECHOCARDIOGRAM (TEE);  Surgeon: Gaye Pollack, MD;  Location: [place];  Service: Open Heart Surgery;  Laterality: N/A;   TRANSCATHETER AORTIC VALVE REPLACEMENT, TRANSFEMORAL     PROCEDURE ATTEMPTED & ABORTED    UMBILICAL HERNIA REPAIR N/A 11/15/2019   Procedure: UMBILICAL HERNIA REPAIR;  Surgeon: Coralie Keens, MD;  Location: [place];  Service: General;  Laterality: N/A;    There were no vitals filed for this visit.   Subjective Assessment - 09/08/21 1015     Subjective Husband reports that the pt has had some spells where he has trouble standing. Has neurology appointment on 09/16/21. Concerned that the patient has Parkinson's. Denies falls but a couple close calls. Has been walking with a cane.    Patient is accompained by: Family member  husband   Pertinent History aortic stenosis status post aortic valve replacement, coronary artery disease status post endarterectomy, essential tremors, depression and anxiety, HOH, sinus bradycardia, stroke 2021    Diagnostic tests none recent    Patient Stated Goals improve balance    Currently in Pain? No/denies                               [facility] Adult PT Treatment/Exercise - 09/08/21 0001       Ambulation/Gait   Ambulation Distance (Feet) 226 Feet    Gait Pattern Step-to pattern;Step-through pattern;Poor foot clearance - left;Poor foot clearance - right;Right flexed knee in stance;Left flexed knee in stance    Ambulation Surface Level;Indoor    Gait velocity  decreased    Gait Comments gait training with SPC with cues for squencing, with RW and 4WW with walker navigation with turns, maneuvering around obstacles in clinic, and safe transfer technique with walkers; cues to increase step length      Neuro Re-ed    Neuro Re-ed Details  R/L elbow prop + gaze stabilizaiton EO and EC 10x each with PT assist for proper positioning                     PT Education - 09/08/21 1058     Education Details edu patient and spouse on safe use of 4WW for ambulation indoors and outdoors    Person(s) Educated Patient;Spouse    Methods Explanation;Demonstration;Tactile cues;Verbal cues    Comprehension Verbalized understanding;Returned demonstration              PT Short Term Goals - 09/08/21 1149       PT SHORT TERM GOAL #1   Title Patient to perform initial HEP with family assistance.    Time 3    Period Weeks    Status Achieved    Target Date 09/10/21               PT Long Term Goals - 09/08/21 1149       PT LONG TERM GOAL #1   Title Patient to perform advanced HEP with family assistance.    Time 8    Period Weeks    Status On-going    Target Date 10/15/21      PT LONG TERM GOAL #2   Title Patient to score at least 18/24 on DGI in order to decrease risk of falls.    Time 8    Period Weeks    Status On-going    Target Date 10/15/21      PT LONG TERM GOAL #3   Title Patient to report 0/10 dizziness with standing vertical and horizontal head movements at variable speeds.    Time 8    Period Weeks    Status On-going    Target Date 10/15/21      PT LONG TERM GOAL #4   Title Patient to report no falls in the past 3 weeks.    Time 8    Period Weeks    Status On-going    Target Date 10/15/21      PT LONG TERM GOAL #5   Title Patient will ambulate over outdoor surfaces with LRAD while performing head turns to scan environment with good stability in order to indicate safe community mobility.    Time 8    Period  Weeks    Status On-going    Target Date  10/15/21                   Plan - 09/08/21 1146     Clinical Impression Statement Patient arrived to session with spouse with report of having increased trouble with standing up/standing last week which prompted them to go to the ED. Work up negative but has appointment with Neurology 09/16/21. Session today focused on gait training with multiple devices to assess most appropriate device for gait as patient is more unsteady today. Educated patient on safe transfers and AD sequencing/navigation; Ultimately with most comfort and safety with [id number]. Ended session with gaze stabilization which brought on c/o mild dizziness. Patient tolerated session well. Both patient and spouse reported understanding of edu provided today.    Personal Factors and Comorbidities Comorbidity 3+;Age;Fitness;Past/Current Experience;Time since onset of injury/illness/exacerbation    Comorbidities aortic stenosis status post aortic valve replacement, coronary artery disease status post endarterectomy, essential tremors, depression and anxiety, HOH, sinus bradycardia, stroke 2021    Examination-Activity Limitations Locomotion Level;Transfers;Bed Mobility;Reach Overhead;Bathing;Bend;Carry;Squat;Stairs;Dressing;Hygiene/Grooming;Stand;Lift    Examination-Participation Restrictions Community Activity;Yard Work;Laundry;Shop;Cleaning;Church;Meal Prep    Stability/Clinical Decision Making Evolving/Moderate complexity    Rehab Potential Good    PT Frequency 2x / week    PT Duration 8 weeks    PT Treatment/Interventions ADLs/Self Care Home Management;Canalith Repostioning;Gait training;Stair training;Functional mobility training;Therapeutic activities;Therapeutic exercise;Balance training;Neuromuscular re-education;Patient/family education;Visual/perceptual remediation/compensation;Vestibular;Cryotherapy;DME Instruction;Moist Heat;Manual techniques;Orthotic Fit/Training;Passive range of  motion;Dry needling;Energy conservation;Taping    PT Next Visit Plan progress gait training with 4WW    PT Home Exercise Plan hip flexion (high knees) at counter then fast feet 3x30 sec. STanding balance with LE elevated in cabinet for 5-10 reps 15 sec. VOR x 1. Standing hip flexion with red t-loop 3x10    Consulted and Agree with Plan of Care Patient;Family member/caregiver    Family Member Consulted spouse             Patient will benefit from skilled therapeutic intervention in order to improve the following deficits and impairments:  Abnormal gait, Difficulty walking, Decreased balance, Decreased strength, Decreased range of motion, Decreased safety awareness, Dizziness, Decreased activity tolerance, Impaired flexibility, Postural dysfunction  Visit Diagnosis: Repeated falls  Unsteadiness on feet  Dizziness and giddiness  Muscle weakness (generalized)  Difficulty in walking, not elsewhere classified     Problem List Patient Active Problem List   Diagnosis Date Noted   Rib fracture 08/09/2021   Syncope 08/08/2021   Rib fractures 08/08/2021   Pain due to onychomycosis of toenails of both feet 04/30/2021   Cognitive impairment 11/11/2020   Depression with anxiety 11/11/2020   Carotid artery disease ([place]) 11/11/2020   Essential tremor 11/11/2020   Bunionette of left foot 06/20/2020   Fatigue 06/20/2020   History of left-sided carotid endarterectomy 05/27/2020   Facial droop 03/05/2020   Mild CAD    Acute encephalopathy 11/18/2019   Acute urinary retention 11/18/2019   Post-operative state 11/18/2019   Aphasia 08/07/2019   S/P AVR (aortic valve replacement)    Carotid artery stenosis    Chronotropic incompetence    Severe aortic stenosis    Sinus bradycardia 08/12/2018   Malignant melanoma of scalp ([place]) 09/22/2015   Benign prostatic hyperplasia with nocturia 11/01/2012   Enlarged prostate with lower urinary tract symptoms (LUTS) 11/01/2012     Janene Harvey, PT, DPT 09/08/21 12:56 PM   [facility] [address] [place], [place], [address] Phone: [phone]   Fax:  [phone]  Name: Joshua Hess MRN: [id number] Date of Birth: 1940/12/07

## 2021-09-10 DIAGNOSIS — G4720 Circadian rhythm sleep disorder, unspecified type: Secondary | ICD-10-CM | POA: Diagnosis not present

## 2021-09-10 DIAGNOSIS — F03918 Unspecified dementia, unspecified severity, with other behavioral disturbance: Secondary | ICD-10-CM | POA: Diagnosis not present

## 2021-09-10 DIAGNOSIS — G3109 Other frontotemporal dementia: Secondary | ICD-10-CM | POA: Diagnosis not present

## 2021-09-10 DIAGNOSIS — F32A Depression, unspecified: Secondary | ICD-10-CM | POA: Diagnosis not present

## 2021-09-12 ENCOUNTER — Other Ambulatory Visit: Payer: Self-pay

## 2021-09-12 ENCOUNTER — Ambulatory Visit: Payer: Medicare HMO | Attending: Internal Medicine | Admitting: Physical Therapy

## 2021-09-12 ENCOUNTER — Encounter: Payer: Self-pay | Admitting: Physical Therapy

## 2021-09-12 DIAGNOSIS — R296 Repeated falls: Secondary | ICD-10-CM | POA: Diagnosis not present

## 2021-09-12 DIAGNOSIS — M6281 Muscle weakness (generalized): Secondary | ICD-10-CM | POA: Diagnosis not present

## 2021-09-12 DIAGNOSIS — R42 Dizziness and giddiness: Secondary | ICD-10-CM | POA: Diagnosis not present

## 2021-09-12 DIAGNOSIS — R262 Difficulty in walking, not elsewhere classified: Secondary | ICD-10-CM | POA: Diagnosis not present

## 2021-09-12 DIAGNOSIS — R2681 Unsteadiness on feet: Secondary | ICD-10-CM | POA: Diagnosis not present

## 2021-09-12 NOTE — Therapy (Signed)
[facility] [address], [place], [place], [address] Phone: [phone]   Fax:  [phone]  Physical Therapy Treatment  Patient Details  Name: Joshua Hess MRN: [id number] Date of Birth: August 04, 1940 Referring Provider (PT): Barb Merino, MD   Encounter Date: 09/12/2021   PT End of Session - 09/12/21 1148     Visit Number 7    Number of Visits 17    Date for PT Re-Evaluation 10/15/21    Authorization Type [employer]    Authorization Time Period Cohere approval 16 visits 08/20/2021 to 10/15/2021    Authorization - Visit Number 7    Authorization - Number of Visits 16    Progress Note Due on Visit 10    PT Start Time 1102    PT Stop Time 1147    PT Time Calculation (min) 45 min    Equipment Utilized During Treatment Gait belt    Activity Tolerance Patient tolerated treatment well;Patient limited by pain    Behavior During Therapy [facility] for tasks assessed/performed;Impulsive             Past Medical History:  Diagnosis Date   Aphasia    Cancer [facility])    behind ear- [place]   Carotid artery stenosis    Chronotropic incompetence    ED (erectile dysfunction)    Elevated PSA    HOH (hard of hearing)    Mild CAD    Plantar fasciitis    Postoperative atrial fibrillation (HCC)    Severe aortic stenosis    a. s/p tissue AVR 10/2018.   Sinus bradycardia    baseline   Stroke ([place]) 03/05/2020   TIA   Wears glasses     Past Surgical History:  Procedure Laterality Date   AORTIC VALVE REPLACEMENT N/A 11/10/2018   Procedure: AORTIC VALVE REPLACEMENT (AVR), USING 23MM INSPIRIS;  Surgeon: Gaye Pollack, MD;  Location: [place];  Service: Open Heart Surgery;  Laterality: N/A;   ENDARTERECTOMY Left 03/08/2020   Procedure: ENDARTERECTOMY CAROTID;  Surgeon: Rosetta Posner, MD;  Location: [place];  Service: Vascular;  Laterality: Left;   EXTERNAL EAR SURGERY     melanoma removed   HERNIA REPAIR Right    inguinal   INGUINAL  HERNIA REPAIR Right 11/15/2019   Procedure: LAPAROSCOPIC RIGHT INGUINAL HERNIA REPAIR WITH MESH;  Surgeon: Coralie Keens, MD;  Location: [place];  Service: General;  Laterality: Right;   RIGHT/LEFT HEART CATH AND CORONARY ANGIOGRAPHY N/A 09/29/2018   Procedure: RIGHT/LEFT HEART CATH AND CORONARY ANGIOGRAPHY;  Surgeon: Burnell Blanks, MD;  Location: [place] CV LAB;  Service: Cardiovascular;  Laterality: N/A;   TEE WITHOUT CARDIOVERSION N/A 11/10/2018   Procedure: TRANSESOPHAGEAL ECHOCARDIOGRAM (TEE);  Surgeon: Gaye Pollack, MD;  Location: [place];  Service: Open Heart Surgery;  Laterality: N/A;   TRANSCATHETER AORTIC VALVE REPLACEMENT, TRANSFEMORAL     PROCEDURE ATTEMPTED & ABORTED    UMBILICAL HERNIA REPAIR N/A 11/15/2019   Procedure: UMBILICAL HERNIA REPAIR;  Surgeon: Coralie Keens, MD;  Location: [place];  Service: General;  Laterality: N/A;    There were no vitals filed for this visit.   Subjective Assessment - 09/12/21 1105     Subjective Husband reports that sleep meds have been changed and pt is doing better. Pt reports still some difficulty sleeping. Having some pain along the LB this AM. Reports that standing HEP makes back pain worse. Husband reports that pt does not prefer walker, much prefers the  cane.    Patient is accompained by: Family member    Pertinent History aortic stenosis status post aortic valve replacement, coronary artery disease status post endarterectomy, essential tremors, depression and anxiety, HOH, sinus bradycardia, stroke 2021    Diagnostic tests none recent    Patient Stated Goals improve balance    Currently in Pain? Yes    Pain Score 7     Pain Location Back    Pain Orientation Left;Right    Pain Descriptors / Indicators Constant;Aching    Pain Type Acute pain    Pain Radiating Towards denies N/T or radiation; denies B&B changes                               [facility] Adult PT Treatment/Exercise - 09/12/21 0001        Ambulation/Gait   Ambulation Distance (Feet) 250 Feet    Assistive device Straight cane    Gait Pattern Step-to pattern;Step-through pattern;Poor foot clearance - left;Poor foot clearance - right;Right flexed knee in stance;Left flexed knee in stance    Ambulation Surface Level;Indoor    Gait velocity decreased    Gait Comments gait training with SPC with heavy manual and verbal cues for AD sequencing and safe placement of the cane      Exercises   Exercises Knee/Hip;Lumbar      Lumbar Exercises: Stretches   Single Knee to Chest Stretch Right;Left;1 rep;30 seconds    Single Knee to Chest Stretch Limitations opposite LE extended    Lower Trunk Rotation Limitations x10 to tolerance    Piriformis Stretch Right;Left;1 rep;30 seconds    Piriformis Stretch Limitations KTOS    Figure 4 Stretch 1 rep;30 seconds;With overpressure    Figure 4 Stretch Limitations to tolerance      Lumbar Exercises: Supine   Bridge 10 reps    Bridge Limitations hip/core instability      Lumbar Exercises: Sidelying   Other Sidelying Lumbar Exercises R/L open book stretch 10x to tolerance   cuein for positioning                    PT Education - 09/12/21 1145     Education Details update to HEP for LBP and advised to discontinue other exercises if they exaccerbate pain; advised to continue practicing gait with SPC inside the home    Person(s) Educated Patient;Spouse    Methods Explanation;Demonstration;Tactile cues;Verbal cues;Handout    Comprehension Verbalized understanding;Returned demonstration              PT Short Term Goals - 09/08/21 1149       PT SHORT TERM GOAL #1   Title Patient to perform initial HEP with family assistance.    Time 3    Period Weeks    Status Achieved    Target Date 09/10/21               PT Long Term Goals - 09/08/21 1149       PT LONG TERM GOAL #1   Title Patient to perform advanced HEP with family assistance.    Time 8    Period Weeks     Status On-going    Target Date 10/15/21      PT LONG TERM GOAL #2   Title Patient to score at least 18/24 on DGI in order to decrease risk of falls.    Time 8    Period Weeks  Status On-going    Target Date 10/15/21      PT LONG TERM GOAL #3   Title Patient to report 0/10 dizziness with standing vertical and horizontal head movements at variable speeds.    Time 8    Period Weeks    Status On-going    Target Date 10/15/21      PT LONG TERM GOAL #4   Title Patient to report no falls in the past 3 weeks.    Time 8    Period Weeks    Status On-going    Target Date 10/15/21      PT LONG TERM GOAL #5   Title Patient will ambulate over outdoor surfaces with LRAD while performing head turns to scan environment with good stability in order to indicate safe community mobility.    Time 8    Period Weeks    Status On-going    Target Date 10/15/21                   Plan - 09/12/21 1148     Clinical Impression Statement Patient arrived to session with spouse with report of severe LBP without known cause. Denies N/T, radiation, or B&B changes. Spouse reports that patient prefers ambulating with SPC rather than 4WW which was agreed upon last session. Worked on gentle lumbopelvic and hip stretching to address patients pain as it limited todays session. Patient tolerated these activities well, thus updated them into HEP. Reviewed AD sequencing with SPC d/t inconsistent use of this device when ambulating into session today. Patient required heavy cueing for consistent AD sequencing. Patient tolerated session well and without complaints upon leaving.    Personal Factors and Comorbidities Comorbidity 3+;Age;Fitness;Past/Current Experience;Time since onset of injury/illness/exacerbation    Comorbidities aortic stenosis status post aortic valve replacement, coronary artery disease status post endarterectomy, essential tremors, depression and anxiety, HOH, sinus bradycardia, stroke 2021     Examination-Activity Limitations Locomotion Level;Transfers;Bed Mobility;Reach Overhead;Bathing;Bend;Carry;Squat;Stairs;Dressing;Hygiene/Grooming;Stand;Lift    Examination-Participation Restrictions Community Activity;Yard Work;Laundry;Shop;Cleaning;Church;Meal Prep    Stability/Clinical Decision Making Evolving/Moderate complexity    Rehab Potential Good    PT Frequency 2x / week    PT Duration 8 weeks    PT Treatment/Interventions ADLs/Self Care Home Management;Canalith Repostioning;Gait training;Stair training;Functional mobility training;Therapeutic activities;Therapeutic exercise;Balance training;Neuromuscular re-education;Patient/family education;Visual/perceptual remediation/compensation;Vestibular;Cryotherapy;DME Instruction;Moist Heat;Manual techniques;Orthotic Fit/Training;Passive range of motion;Dry needling;Energy conservation;Taping    PT Next Visit Plan progress gait training with 4WW, SPC; progress balance    PT Home Exercise Plan hip flexion (high knees) at counter then fast feet 3x30 sec. STanding balance with LE elevated in cabinet for 5-10 reps 15 sec. VOR x 1. Standing hip flexion with red t-loop 3x10    Consulted and Agree with Plan of Care Patient;Family member/caregiver    Family Member Consulted spouse             Patient will benefit from skilled therapeutic intervention in order to improve the following deficits and impairments:  Abnormal gait, Difficulty walking, Decreased balance, Decreased strength, Decreased range of motion, Decreased safety awareness, Dizziness, Decreased activity tolerance, Impaired flexibility, Postural dysfunction  Visit Diagnosis: Repeated falls  Unsteadiness on feet  Dizziness and giddiness  Muscle weakness (generalized)  Difficulty in walking, not elsewhere classified     Problem List Patient Active Problem List   Diagnosis Date Noted   Rib fracture 08/09/2021   Syncope 08/08/2021   Rib fractures 08/08/2021   Pain due to  onychomycosis of toenails of both feet 04/30/2021   Cognitive impairment 11/11/2020  Depression with anxiety 11/11/2020   Carotid artery disease ([place]) 11/11/2020   Essential tremor 11/11/2020   Bunionette of left foot 06/20/2020   Fatigue 06/20/2020   History of left-sided carotid endarterectomy 05/27/2020   Facial droop 03/05/2020   Mild CAD    Acute encephalopathy 11/18/2019   Acute urinary retention 11/18/2019   Post-operative state 11/18/2019   Aphasia 08/07/2019   S/P AVR (aortic valve replacement)    Carotid artery stenosis    Chronotropic incompetence    Severe aortic stenosis    Sinus bradycardia 08/12/2018   Malignant melanoma of scalp ([place]) 09/22/2015   Benign prostatic hyperplasia with nocturia 11/01/2012   Enlarged prostate with lower urinary tract symptoms (LUTS) 11/01/2012    Janene Harvey, PT, DPT 09/12/21 11:55 AM   [facility] [address] [place], [place], [address] Phone: [phone]   Fax:  [phone]  Name: Joshua Hess MRN: [id number] Date of Birth: Jul 30, 1940

## 2021-09-15 ENCOUNTER — Other Ambulatory Visit: Payer: Self-pay

## 2021-09-15 ENCOUNTER — Ambulatory Visit: Payer: Medicare HMO

## 2021-09-15 DIAGNOSIS — R2681 Unsteadiness on feet: Secondary | ICD-10-CM

## 2021-09-15 DIAGNOSIS — R296 Repeated falls: Secondary | ICD-10-CM

## 2021-09-15 DIAGNOSIS — M6281 Muscle weakness (generalized): Secondary | ICD-10-CM

## 2021-09-15 DIAGNOSIS — R42 Dizziness and giddiness: Secondary | ICD-10-CM | POA: Diagnosis not present

## 2021-09-15 DIAGNOSIS — R262 Difficulty in walking, not elsewhere classified: Secondary | ICD-10-CM

## 2021-09-15 NOTE — Therapy (Signed)
[facility] [address] [place], [place], [address] Phone: [phone]   Fax:  [phone]  Physical Therapy Treatment  Patient Details  Name: Joshua Hess MRN: [id number] Date of Birth: [birth year] Referring Provider (PT): Barb Merino, MD   Encounter Date: 09/15/2021   PT End of Session - 09/15/21 1311     Visit Number 8    Number of Visits 17    Date for PT Re-Evaluation 10/15/21    Authorization Type [employer]    Authorization Time Period Cohere approval 16 visits 08/20/2021 to 10/15/2021    Authorization - Visit Number 8    Authorization - Number of Visits 16    Progress Note Due on Visit 10    PT Start Time 1315    PT Stop Time 1400    PT Time Calculation (min) 45 min    Equipment Utilized During Treatment Gait belt    Activity Tolerance Patient tolerated treatment well;Patient limited by pain    Behavior During Therapy [facility] for tasks assessed/performed;Impulsive             Past Medical History:  Diagnosis Date   Aphasia    Cancer [facility])    behind ear- [place]   Carotid artery stenosis    Chronotropic incompetence    ED (erectile dysfunction)    Elevated PSA    HOH (hard of hearing)    Mild CAD    Plantar fasciitis    Postoperative atrial fibrillation (HCC)    Severe aortic stenosis    a. s/p tissue AVR 10/2018.   Sinus bradycardia    baseline   Stroke ([place]) 03/05/2020   TIA   Wears glasses     Past Surgical History:  Procedure Laterality Date   AORTIC VALVE REPLACEMENT N/A 11/10/2018   Procedure: AORTIC VALVE REPLACEMENT (AVR), USING 23MM INSPIRIS;  Surgeon: Gaye Pollack, MD;  Location: [place];  Service: Open Heart Surgery;  Laterality: N/A;   ENDARTERECTOMY Left 03/08/2020   Procedure: ENDARTERECTOMY CAROTID;  Surgeon: Rosetta Posner, MD;  Location: [place];  Service: Vascular;  Laterality: Left;   EXTERNAL EAR SURGERY     melanoma removed   HERNIA REPAIR Right    inguinal   INGUINAL  HERNIA REPAIR Right 11/15/2019   Procedure: LAPAROSCOPIC RIGHT INGUINAL HERNIA REPAIR WITH MESH;  Surgeon: Coralie Keens, MD;  Location: [place];  Service: General;  Laterality: Right;   RIGHT/LEFT HEART CATH AND CORONARY ANGIOGRAPHY N/A 09/29/2018   Procedure: RIGHT/LEFT HEART CATH AND CORONARY ANGIOGRAPHY;  Surgeon: Burnell Blanks, MD;  Location: [place] CV LAB;  Service: Cardiovascular;  Laterality: N/A;   TEE WITHOUT CARDIOVERSION N/A 11/10/2018   Procedure: TRANSESOPHAGEAL ECHOCARDIOGRAM (TEE);  Surgeon: Gaye Pollack, MD;  Location: [place];  Service: Open Heart Surgery;  Laterality: N/A;   TRANSCATHETER AORTIC VALVE REPLACEMENT, TRANSFEMORAL     PROCEDURE ATTEMPTED & ABORTED    UMBILICAL HERNIA REPAIR N/A 11/15/2019   Procedure: UMBILICAL HERNIA REPAIR;  Surgeon: Coralie Keens, MD;  Location: [place];  Service: General;  Laterality: N/A;    There were no vitals filed for this visit.   Subjective Assessment - 09/15/21 1313     Subjective Husband reports he is becoming resistant to using the cane and feels he is better without    Patient is accompained by: Family member    Pertinent History aortic stenosis status post aortic valve replacement, coronary artery disease status post endarterectomy, essential tremors, depression  and anxiety, HOH, sinus bradycardia, stroke 2021    Diagnostic tests none recent    Patient Stated Goals improve balance                [facility] PT Assessment - 09/15/21 0001       Assessment   Medical Diagnosis Trauma, Syncope, unspecified syncope type, Closed fracture of multiple ribs of right side, initial encounter    Referring Provider (PT) Barb Merino, MD    Onset Date/Surgical Date 08/08/21                           [facility] Adult PT Treatment/Exercise - 09/15/21 0001       Ambulation/Gait   Ambulation/Gait Yes    Ambulation/Gait Assistance 4: Min guard    Ambulation Distance (Feet) 1000 Feet    Assistive device  Other (Comment)   single trekking pole x 500 ft, double pole x 500 ft   Gait Pattern Shuffle    Ambulation Surface Outdoor;Paved    Gait Comments gait training initiated with use of single trekking pole around the outside of facility with emphasis on negotiating curbs, sidewalk with cues for increased step height to reduce shuffling. Additional trial with double pole and improved stabiilty and reciprocal pattern appreciated, but continued shuffling noted      Knee/Hip Exercises: Standing   Hip Flexion Stengthening;Both;3 sets;10 reps    Hip Flexion Limitations 5#    Other Standing Knee Exercises sidestepping x 2 min 5#      Knee/Hip Exercises: Seated   Long Arc Quad Strengthening;Both;3 sets;10 reps    Long Arc Quad Weight 5 lbs.                       PT Short Term Goals - 09/08/21 1149       PT SHORT TERM GOAL #1   Title Patient to perform initial HEP with family assistance.    Time 3    Period Weeks    Status Achieved    Target Date 09/10/21               PT Long Term Goals - 09/08/21 1149       PT LONG TERM GOAL #1   Title Patient to perform advanced HEP with family assistance.    Time 8    Period Weeks    Status On-going    Target Date 10/15/21      PT LONG TERM GOAL #2   Title Patient to score at least 18/24 on DGI in order to decrease risk of falls.    Time 8    Period Weeks    Status On-going    Target Date 10/15/21      PT LONG TERM GOAL #3   Title Patient to report 0/10 dizziness with standing vertical and horizontal head movements at variable speeds.    Time 8    Period Weeks    Status On-going    Target Date 10/15/21      PT LONG TERM GOAL #4   Title Patient to report no falls in the past 3 weeks.    Time 8    Period Weeks    Status On-going    Target Date 10/15/21      PT LONG TERM GOAL #5   Title Patient will ambulate over outdoor surfaces with LRAD while performing head turns to scan environment with good stability in order  to indicate  safe community mobility.    Time 8    Period Weeks    Status On-going    Target Date 10/15/21                   Plan - 09/15/21 1539     Clinical Impression Statement Ambulation trials initiated with single trekking pole with emphasis on negotiating outdoor surfaces and demands with requirement of supervision-CGA with one instance of becoming unsteady when negotiating slight downhill gradient requiring tactile cues to correct. Poor foot clearance noted throughout gait trial with shuffling/forefoot initial contact noted despite verbal cues for increase foot clearance. Additional gait trial with double trekking pole and improvement in reciprocal motion and larger stride length, but continued shuffled pattern. Tolerating resistance activities well with difficulty performing standing march needing BUE support to prevent LOB. Continued sessions indicated to progress mobility and reduce risk for falls. Therapist reinforced benefits of using cane for ambulation and possible need for increased support via rollator/RW    Personal Factors and Comorbidities Comorbidity 3+;Age;Fitness;Past/Current Experience;Time since onset of injury/illness/exacerbation    Comorbidities aortic stenosis status post aortic valve replacement, coronary artery disease status post endarterectomy, essential tremors, depression and anxiety, HOH, sinus bradycardia, stroke 2021    Examination-Activity Limitations Locomotion Level;Transfers;Bed Mobility;Reach Overhead;Bathing;Bend;Carry;Squat;Stairs;Dressing;Hygiene/Grooming;Stand;Lift    Examination-Participation Restrictions Community Activity;Yard Work;Laundry;Shop;Cleaning;Church;Meal Prep    Stability/Clinical Decision Making Evolving/Moderate complexity    Rehab Potential Good    PT Frequency 2x / week    PT Duration 8 weeks    PT Treatment/Interventions ADLs/Self Care Home Management;Canalith Repostioning;Gait training;Stair training;Functional mobility  training;Therapeutic activities;Therapeutic exercise;Balance training;Neuromuscular re-education;Patient/family education;Visual/perceptual remediation/compensation;Vestibular;Cryotherapy;DME Instruction;Moist Heat;Manual techniques;Orthotic Fit/Training;Passive range of motion;Dry needling;Energy conservation;Taping    PT Next Visit Plan progress gait training with 4WW, SPC; progress balance    PT Home Exercise Plan hip flexion (high knees) at counter then fast feet 3x30 sec. STanding balance with LE elevated in cabinet for 5-10 reps 15 sec. VOR x 1. Standing hip flexion with red t-loop 3x10    Consulted and Agree with Plan of Care Patient;Family member/caregiver    Family Member Consulted spouse             Patient will benefit from skilled therapeutic intervention in order to improve the following deficits and impairments:  Abnormal gait, Difficulty walking, Decreased balance, Decreased strength, Decreased range of motion, Decreased safety awareness, Dizziness, Decreased activity tolerance, Impaired flexibility, Postural dysfunction  Visit Diagnosis: Repeated falls  Unsteadiness on feet  Dizziness and giddiness  Muscle weakness (generalized)  Difficulty in walking, not elsewhere classified     Problem List Patient Active Problem List   Diagnosis Date Noted   Rib fracture 08/09/2021   Syncope 08/08/2021   Rib fractures 08/08/2021   Pain due to onychomycosis of toenails of both feet 04/30/2021   Cognitive impairment 11/11/2020   Depression with anxiety 11/11/2020   Carotid artery disease ([place]) 11/11/2020   Essential tremor 11/11/2020   Bunionette of left foot 06/20/2020   Fatigue 06/20/2020   History of left-sided carotid endarterectomy 05/27/2020   Facial droop 03/05/2020   Mild CAD    Acute encephalopathy 11/18/2019   Acute urinary retention 11/18/2019   Post-operative state 11/18/2019   Aphasia 08/07/2019   S/P AVR (aortic valve replacement)    Carotid artery  stenosis    Chronotropic incompetence    Severe aortic stenosis    Sinus bradycardia 08/12/2018   Malignant melanoma of scalp ([place]) 09/22/2015   Benign prostatic hyperplasia with nocturia 11/01/2012   Enlarged  prostate with lower urinary tract symptoms (LUTS) 11/01/2012    Toniann Fail, PT 09/15/2021, 3:43 PM  [facility] [address], [place], [place], [address] Phone: [phone]   Fax:  [phone]  Name: ALBAN MARUCCI MRN: [id number] Date of Birth: 1941-02-14

## 2021-09-16 DIAGNOSIS — F0390 Unspecified dementia without behavioral disturbance: Secondary | ICD-10-CM | POA: Diagnosis not present

## 2021-09-16 DIAGNOSIS — R2689 Other abnormalities of gait and mobility: Secondary | ICD-10-CM | POA: Diagnosis not present

## 2021-09-16 DIAGNOSIS — G44209 Tension-type headache, unspecified, not intractable: Secondary | ICD-10-CM | POA: Diagnosis not present

## 2021-09-16 DIAGNOSIS — E559 Vitamin D deficiency, unspecified: Secondary | ICD-10-CM | POA: Diagnosis not present

## 2021-09-16 DIAGNOSIS — R519 Headache, unspecified: Secondary | ICD-10-CM | POA: Diagnosis not present

## 2021-09-16 DIAGNOSIS — R251 Tremor, unspecified: Secondary | ICD-10-CM | POA: Diagnosis not present

## 2021-09-22 ENCOUNTER — Other Ambulatory Visit: Payer: Self-pay

## 2021-09-22 ENCOUNTER — Encounter: Payer: Self-pay | Admitting: Physical Therapy

## 2021-09-22 ENCOUNTER — Ambulatory Visit: Payer: Medicare HMO | Admitting: Physical Therapy

## 2021-09-22 DIAGNOSIS — R42 Dizziness and giddiness: Secondary | ICD-10-CM

## 2021-09-22 DIAGNOSIS — R296 Repeated falls: Secondary | ICD-10-CM

## 2021-09-22 DIAGNOSIS — R262 Difficulty in walking, not elsewhere classified: Secondary | ICD-10-CM | POA: Diagnosis not present

## 2021-09-22 DIAGNOSIS — M6281 Muscle weakness (generalized): Secondary | ICD-10-CM | POA: Diagnosis not present

## 2021-09-22 DIAGNOSIS — R2681 Unsteadiness on feet: Secondary | ICD-10-CM

## 2021-09-22 NOTE — Therapy (Signed)
[facility] [address] [place], [place], [address] Phone: [phone]   Fax:  [phone]  Physical Therapy Treatment  Patient Details  Name: Joshua Hess MRN: [id number] Date of Birth: 03/03/1941 Referring Provider (PT): Barb Merino, MD   Encounter Date: 09/22/2021   PT End of Session - 09/22/21 1158     Visit Number 9    Number of Visits 17    Date for PT Re-Evaluation 10/15/21    Authorization Type [employer]    Authorization Time Period Cohere approval 16 visits 08/20/2021 to 10/15/2021    Authorization - Visit Number 9    Authorization - Number of Visits 16    Progress Note Due on Visit 10    PT Start Time 1104    PT Stop Time 1149    PT Time Calculation (min) 45 min    Equipment Utilized During Treatment Gait belt    Activity Tolerance Patient tolerated treatment well    Behavior During Therapy [facility] for tasks assessed/performed;Impulsive             Past Medical History:  Diagnosis Date   Aphasia    Cancer [facility])    behind ear- [place]   Carotid artery stenosis    Chronotropic incompetence    ED (erectile dysfunction)    Elevated PSA    HOH (hard of hearing)    Mild CAD    Plantar fasciitis    Postoperative atrial fibrillation (HCC)    Severe aortic stenosis    a. s/p tissue AVR 10/2018.   Sinus bradycardia    baseline   Stroke ([place]) 03/05/2020   TIA   Wears glasses     Past Surgical History:  Procedure Laterality Date   AORTIC VALVE REPLACEMENT N/A 11/10/2018   Procedure: AORTIC VALVE REPLACEMENT (AVR), USING 23MM INSPIRIS;  Surgeon: Gaye Pollack, MD;  Location: [place];  Service: Open Heart Surgery;  Laterality: N/A;   ENDARTERECTOMY Left 03/08/2020   Procedure: ENDARTERECTOMY CAROTID;  Surgeon: Rosetta Posner, MD;  Location: [place];  Service: Vascular;  Laterality: Left;   EXTERNAL EAR SURGERY     melanoma removed   HERNIA REPAIR Right    inguinal   INGUINAL HERNIA REPAIR Right 11/15/2019    Procedure: LAPAROSCOPIC RIGHT INGUINAL HERNIA REPAIR WITH MESH;  Surgeon: Coralie Keens, MD;  Location: [place];  Service: General;  Laterality: Right;   RIGHT/LEFT HEART CATH AND CORONARY ANGIOGRAPHY N/A 09/29/2018   Procedure: RIGHT/LEFT HEART CATH AND CORONARY ANGIOGRAPHY;  Surgeon: Burnell Blanks, MD;  Location: [place] [facility];  Service: Cardiovascular;  Laterality: N/A;   TEE WITHOUT CARDIOVERSION N/A 11/10/2018   Procedure: TRANSESOPHAGEAL ECHOCARDIOGRAM (TEE);  Surgeon: Gaye Pollack, MD;  Location: [place];  Service: Open Heart Surgery;  Laterality: N/A;   TRANSCATHETER AORTIC VALVE REPLACEMENT, TRANSFEMORAL     PROCEDURE ATTEMPTED & ABORTED    UMBILICAL HERNIA REPAIR N/A 11/15/2019   Procedure: UMBILICAL HERNIA REPAIR;  Surgeon: Coralie Keens, MD;  Location: [place];  Service: General;  Laterality: N/A;    There were no vitals filed for this visit.   Subjective Assessment - 09/22/21 1102     Subjective Shakiness occurs in B hands, R>L. This started recently. Also reports that he feels more unsteady today. Does not like the styling of a walker.    Pertinent History aortic stenosis status post aortic valve replacement, coronary artery disease status post endarterectomy, essential tremors, depression and anxiety, HOH,  sinus bradycardia, stroke 2021    Diagnostic tests none recent    Patient Stated Goals improve balance    Currently in Pain? No/denies                               [facility] Adult PT Treatment/Exercise - 09/22/21 0001       Ambulation/Gait   Ambulation Distance (Feet) 80 Feet    Assistive device Other (Comment)   2 walking poles   Gait Pattern Shuffle    Ambulation Surface Level;Indoor    Gait Comments gait training with walking poles with inability to maintain reciprocal pattern with verbal and tactile cues; gait training with 4WW around gym between exercises with cues for safety and use of brakes      Neuro Re-ed    Neuro Re-ed  Details  R/L fwd/back stepping over 1/2 foam with 1 UE support and CGA 10x, 2x20 alt toe tap with variable UE support; alt step up on 6" step- ocnsiderable difficulty maintaining alternating sequencing and following direction      Knee/Hip Exercises: Seated   Sit to Sand --   blue medball at chest; heavy cueing for anterior trunk lean and sequencing of movements                    PT Education - 09/22/21 1150     Education Details discusison with patient and spouse on importance of 4WW and shower seat use on days that he feels more unsteady    Person(s) Educated Patient;Spouse    Methods Explanation;Demonstration;Tactile cues;Verbal cues    Comprehension Verbalized understanding;Returned demonstration              PT Short Term Goals - 09/08/21 1149       PT SHORT TERM GOAL #1   Title Patient to perform initial HEP with family assistance.    Time 3    Period Weeks    Status Achieved    Target Date 09/10/21               PT Long Term Goals - 09/08/21 1149       PT LONG TERM GOAL #1   Title Patient to perform advanced HEP with family assistance.    Time 8    Period Weeks    Status On-going    Target Date 10/15/21      PT LONG TERM GOAL #2   Title Patient to score at least 18/24 on DGI in order to decrease risk of falls.    Time 8    Period Weeks    Status On-going    Target Date 10/15/21      PT LONG TERM GOAL #3   Title Patient to report 0/10 dizziness with standing vertical and horizontal head movements at variable speeds.    Time 8    Period Weeks    Status On-going    Target Date 10/15/21      PT LONG TERM GOAL #4   Title Patient to report no falls in the past 3 weeks.    Time 8    Period Weeks    Status On-going    Target Date 10/15/21      PT LONG TERM GOAL #5   Title Patient will ambulate over outdoor surfaces with LRAD while performing head turns to scan environment with good stability in order to indicate safe community mobility.     Time 8  Period Weeks    Status On-going    Target Date 10/15/21                   Plan - 09/22/21 1158     Clinical Impression Statement Patient arrived to session with spouse with report of more trouble with mobility today. Arrived without AD and required 4WW to ambulate into clinic today d/t unsteadiness. Verbalized agreement to use 4WW after unsuccessful gait training with 2 walking poles. Performed STS transfers with weighted ball, requiring heavy cueing for anterior trunk lean and sequencing of movements. Dynamic balance activities required cues to increase step length and height to avoid catching toes on threshold/obstacle. Patient with more difficulty with movement sequencing and following instruction, thus spoke with spouse and patient about fluctuating function and poor safety awareness. Both reported understanding of use of DME and AD to maximize safety in the home.    Personal Factors and Comorbidities Comorbidity 3+;Age;Fitness;Past/Current Experience;Time since onset of injury/illness/exacerbation    Comorbidities aortic stenosis status post aortic valve replacement, coronary artery disease status post endarterectomy, essential tremors, depression and anxiety, HOH, sinus bradycardia, stroke 2021    Examination-Activity Limitations Locomotion Level;Transfers;Bed Mobility;Reach Overhead;Bathing;Bend;Carry;Squat;Stairs;Dressing;Hygiene/Grooming;Stand;Lift    Examination-Participation Restrictions Community Activity;Yard Work;Laundry;Shop;Cleaning;Church;Meal Prep    Stability/Clinical Decision Making Evolving/Moderate complexity    Rehab Potential Good    PT Frequency 2x / week    PT Duration 8 weeks    PT Treatment/Interventions ADLs/Self Care Home Management;Canalith Repostioning;Gait training;Stair training;Functional mobility training;Therapeutic activities;Therapeutic exercise;Balance training;Neuromuscular re-education;Patient/family education;Visual/perceptual  remediation/compensation;Vestibular;Cryotherapy;DME Instruction;Moist Heat;Manual techniques;Orthotic Fit/Training;Passive range of motion;Dry needling;Energy conservation;Taping    PT Next Visit Plan progress gait training with 4WW, SPC; progress balance    PT Home Exercise Plan hip flexion (high knees) at counter then fast feet 3x30 sec. STanding balance with LE elevated in cabinet for 5-10 reps 15 sec. VOR x 1. Standing hip flexion with red t-loop 3x10    Consulted and Agree with Plan of Care Patient;Family member/caregiver    Family Member Consulted spouse             Patient will benefit from skilled therapeutic intervention in order to improve the following deficits and impairments:  Abnormal gait, Difficulty walking, Decreased balance, Decreased strength, Decreased range of motion, Decreased safety awareness, Dizziness, Decreased activity tolerance, Impaired flexibility, Postural dysfunction  Visit Diagnosis: Repeated falls  Unsteadiness on feet  Dizziness and giddiness     Problem List Patient Active Problem List   Diagnosis Date Noted   Rib fracture 08/09/2021   Syncope 08/08/2021   Rib fractures 08/08/2021   Pain due to onychomycosis of toenails of both feet 04/30/2021   Cognitive impairment 11/11/2020   Depression with anxiety 11/11/2020   Carotid artery disease ([place]) 11/11/2020   Essential tremor 11/11/2020   Bunionette of left foot 06/20/2020   Fatigue 06/20/2020   History of left-sided carotid endarterectomy 05/27/2020   Facial droop 03/05/2020   Mild CAD    Acute encephalopathy 11/18/2019   Acute urinary retention 11/18/2019   Post-operative state 11/18/2019   Aphasia 08/07/2019   S/P AVR (aortic valve replacement)    Carotid artery stenosis    Chronotropic incompetence    Severe aortic stenosis    Sinus bradycardia 08/12/2018   Malignant melanoma of scalp ([place]) 09/22/2015   Benign prostatic hyperplasia with nocturia 11/01/2012   Enlarged prostate  with lower urinary tract symptoms (LUTS) 11/01/2012   Janene Harvey, PT, DPT 09/22/21 12:01 PM   [facility] [address]  [place], [place], [place], [address] Phone: [phone]   Fax:  [phone]  Name: ETHAN CLAYBURN MRN: [id number] Date of Birth: 1940-12-22

## 2021-09-26 ENCOUNTER — Other Ambulatory Visit: Payer: Self-pay

## 2021-09-26 ENCOUNTER — Ambulatory Visit: Payer: Medicare HMO

## 2021-09-26 DIAGNOSIS — R42 Dizziness and giddiness: Secondary | ICD-10-CM

## 2021-09-26 DIAGNOSIS — R262 Difficulty in walking, not elsewhere classified: Secondary | ICD-10-CM | POA: Diagnosis not present

## 2021-09-26 DIAGNOSIS — M6281 Muscle weakness (generalized): Secondary | ICD-10-CM | POA: Diagnosis not present

## 2021-09-26 DIAGNOSIS — R2681 Unsteadiness on feet: Secondary | ICD-10-CM

## 2021-09-26 DIAGNOSIS — R296 Repeated falls: Secondary | ICD-10-CM

## 2021-09-26 NOTE — Therapy (Signed)
[place] [facility] ?[address], [address] ?[place], [place], [address] ?Phone: [phone]   Fax:  [phone] ? ?Physical Therapy Treatment and Progress Note ? ?Patient Details  ?Name: Joshua Hess ?MRN: [id number] ?Date of Birth: [birth year] ?Referring Provider (PT): Barb Merino, MD ? ?Progress Note ?Reporting Period 08/20/21 to 09/26/21 ? ?See note below for Objective Data and Assessment of Progress/Goals.  ? ?  ?Encounter Date: 09/26/2021 ? ? PT End of Session - 09/26/21 1107   ? ? Visit Number 10   ? Number of Visits 17   ? Date for PT Re-Evaluation 10/15/21   ? Authorization Type [employer]   ? Authorization Time Period Cohere approval 16 visits 08/20/2021 to 10/15/2021   ? Authorization - Visit Number 10   ? Authorization - Number of Visits 16   ? Progress Note Due on Visit 20   ? PT Start Time 1104   ? PT Stop Time 1497   ? PT Time Calculation (min) 45 min   ? Equipment Utilized During Treatment Gait belt   ? Activity Tolerance Patient tolerated treatment well   ? Behavior During Therapy [facility] for tasks assessed/performed;Impulsive   ? ?  ?  ? ?  ? ? ?Past Medical History:  ?Diagnosis Date  ? Aphasia   ? Cancer [facility])   ? behind ear- [place]  ? Carotid artery stenosis   ? Chronotropic incompetence   ? ED (erectile dysfunction)   ? Elevated PSA   ? HOH (hard of hearing)   ? Mild CAD   ? Plantar fasciitis   ? Postoperative atrial fibrillation (HCC)   ? Severe aortic stenosis   ? a. s/p tissue AVR 10/2018.  ? Sinus bradycardia   ? baseline  ? Stroke [facility]) 03/05/2020  ? TIA  ? Wears glasses   ? ? ?Past Surgical History:  ?Procedure Laterality Date  ? AORTIC VALVE REPLACEMENT N/A 11/10/2018  ? Procedure: AORTIC VALVE REPLACEMENT (AVR), USING 23MM INSPIRIS;  Surgeon: Gaye Pollack, MD;  Location: [place];  Service: Open Heart Surgery;  Laterality: N/A;  ? ENDARTERECTOMY Left 03/08/2020  ? Procedure: ENDARTERECTOMY CAROTID;  Surgeon: Rosetta Posner, MD;  Location: [place];  Service:  Vascular;  Laterality: Left;  ? EXTERNAL EAR SURGERY    ? melanoma removed  ? HERNIA REPAIR Right   ? inguinal  ? INGUINAL HERNIA REPAIR Right 11/15/2019  ? Procedure: LAPAROSCOPIC RIGHT INGUINAL HERNIA REPAIR WITH MESH;  Surgeon: Coralie Keens, MD;  Location: [place];  Service: General;  Laterality: Right;  ? RIGHT/LEFT HEART CATH AND CORONARY ANGIOGRAPHY N/A 09/29/2018  ? Procedure: RIGHT/LEFT HEART CATH AND CORONARY ANGIOGRAPHY;  Surgeon: Burnell Blanks, MD;  Location: [place] [facility];  Service: Cardiovascular;  Laterality: N/A;  ? TEE WITHOUT CARDIOVERSION N/A 11/10/2018  ? Procedure: TRANSESOPHAGEAL ECHOCARDIOGRAM (TEE);  Surgeon: Gaye Pollack, MD;  Location: [place];  Service: Open Heart Surgery;  Laterality: N/A;  ? TRANSCATHETER AORTIC VALVE REPLACEMENT, TRANSFEMORAL    ? PROCEDURE ATTEMPTED & ABORTED   ? UMBILICAL HERNIA REPAIR N/A 11/15/2019  ? Procedure: UMBILICAL HERNIA REPAIR;  Surgeon: Coralie Keens, MD;  Location: [place];  Service: General;  Laterality: N/A;  ? ? ?There were no vitals filed for this visit. ? ? Subjective Assessment - 09/26/21 1108   ? ? Subjective I've had some good days and bad days this week. Reports he does not care for using the rollator   ? Pertinent History aortic  stenosis status post aortic valve replacement, coronary artery disease status post endarterectomy, essential tremors, depression and anxiety, HOH, sinus bradycardia, stroke 2021   ? Diagnostic tests none recent   ? Patient Stated Goals improve balance   ? Currently in Pain? No/denies   ? Pain Score 0-No pain   ? ?  ?  ? ?  ? ? ? ? ? [facility] PT Assessment - 09/26/21 0001   ? ?  ? Assessment  ? Medical Diagnosis Trauma, Syncope, unspecified syncope type, Closed fracture of multiple ribs of right side, initial encounter   ? Referring Provider (PT) Barb Merino, MD   ? Onset Date/Surgical Date 08/08/21   ?  ? Ambulation/Gait  ? Ambulation/Gait Yes   ? Ambulation/Gait Assistance 6: Modified independent  (Device/Increase time);5: Supervision   ? Ambulation Distance (Feet) 200 Feet   ? Assistive device 4-wheeled walker   ? Gait Pattern Step-through pattern;Shuffle   ? Ambulation Surface Level;Indoor   ? Gait Comments Decrease shuffling when using 4WW with improved step-length evident   ?  ? Dynamic Gait Index  ? Level Surface Mild Impairment   ? Change in Gait Speed Mild Impairment   ? Gait with Horizontal Head Turns Mild Impairment   ? Gait with Vertical Head Turns Mild Impairment   ? Gait and Pivot Turn Normal   ? Step Over Obstacle Mild Impairment   ? Step Around Obstacles Normal   ? Steps Mild Impairment   ? Total Score 18   ?  ? Timed Up and Go Test  ? Normal TUG (seconds) 16.43   ? TUG Comments total of 4 trials: 2 without AD and 2 with rollator: without AD: 24.95 sec avg. With AD: 19.28 sec   ? ?  ?  ? ?  ? ? ? ? ? ? ? ? ? ? ? ? ? ? ? ? [place] Adult PT Treatment/Exercise - 09/26/21 0001   ? ?  ? Self-Care  ? Self-Care Other Self-Care Comments   ? Other Self-Care Comments  Discussion with pt and spouse regarding progress note and functional status. Discussed POC details to present and benefits/observations of using 4WW vs without AD and its impact on gait and balance. Discussed patient's cognitive/memory deficits and benefit of attending instruction-led fitness classes to maintain functional gains and improve activity tolerance and safety with activities   ?  ? Knee/Hip Exercises: Standing  ? Hip Flexion Stengthening;Both;3 sets;10 reps   ? Other Standing Knee Exercises sidestepping x 2 min   ? ?  ?  ? ?  ? ? ? ? ? ? ? ? ? ? ? ? PT Short Term Goals - 09/08/21 1149   ? ?  ? PT SHORT TERM GOAL #1  ? Title Patient to perform initial HEP with family assistance.   ? Time 3   ? Period Weeks   ? Status Achieved   ? Target Date 09/10/21   ? ?  ?  ? ?  ? ? ? ? PT Long Term Goals - 09/26/21 1219   ? ?  ? PT LONG TERM GOAL #1  ? Title Patient to perform advanced HEP with family assistance.   ? Baseline Discussed using  instructor-leg exercise activities due to cognitive/memory deficits, e.g. group fitness class or use of internet videos with spouse supervising   ? Time 8   ? Period Weeks   ? Status On-going   ? Target Date 10/15/21   ?  ? PT LONG TERM  GOAL #2  ? Title Patient to score at least 18/24 on DGI in order to decrease risk of falls.   ? Baseline 18/24 with use of 4WW   ? Time 8   ? Period Weeks   ? Status Achieved   ? Target Date 10/15/21   ?  ? PT LONG TERM GOAL #3  ? Title Patient to report 0/10 dizziness with standing vertical and horizontal head movements at variable speeds.   ? Baseline variable in presentation, mainly with initial sit to stand   ? Time 8   ? Period Weeks   ? Status On-going   ? Target Date 10/15/21   ?  ? PT LONG TERM GOAL #4  ? Title Patient to report no falls in the past 3 weeks.   ? Time 8   ? Period Weeks   ? Status Achieved   ? Target Date 10/15/21   ?  ? PT LONG TERM GOAL #5  ? Title Patient will ambulate over outdoor surfaces with LRAD while performing head turns to scan environment with good stability in order to indicate safe community mobility.   ? Baseline use of 4WW and supervision   ? Time 8   ? Period Weeks   ? Status Achieved   ? Target Date 10/15/21   ? ?  ?  ? ?  ? ? ? ? ? ? ? ? Plan - 09/26/21 1221   ? ? Clinical Impression Statement Progress note completed today with improve Dynamic Gait Index score to 18/24. TUG test performed over 4 trials (two without AD and two with) with best performance using 4WW and pt requiring heavy cues for continued sequencing during TUG task. Patient's main report of dizziness usually occurs with an initial sit to stand where he reports improved stability with his hand resting on object/AD. Gait pattern is improved with use of AD vs without and would benefit from its continued use.  Discussed progress with spouse and rationale of PT intervention being more adaptive/compensatory vs restorative at this stage and discussed benefits of community-based  resources, e.g. Day program, groupd fitness, etc   ? Personal Factors and Comorbidities Comorbidity 3+;Age;Fitness;Past/Current Experience;Time since onset of injury/illness/exacerbation   ? Comorbidities aortic stenosis sta

## 2021-09-29 ENCOUNTER — Ambulatory Visit: Payer: Medicare HMO | Admitting: Physical Therapy

## 2021-09-29 ENCOUNTER — Encounter: Payer: Self-pay | Admitting: Physical Therapy

## 2021-09-29 ENCOUNTER — Other Ambulatory Visit: Payer: Self-pay

## 2021-09-29 DIAGNOSIS — M6281 Muscle weakness (generalized): Secondary | ICD-10-CM

## 2021-09-29 DIAGNOSIS — R42 Dizziness and giddiness: Secondary | ICD-10-CM

## 2021-09-29 DIAGNOSIS — R296 Repeated falls: Secondary | ICD-10-CM | POA: Diagnosis not present

## 2021-09-29 DIAGNOSIS — R2681 Unsteadiness on feet: Secondary | ICD-10-CM

## 2021-09-29 DIAGNOSIS — R262 Difficulty in walking, not elsewhere classified: Secondary | ICD-10-CM | POA: Diagnosis not present

## 2021-09-29 NOTE — Therapy (Signed)
[place] [facility] ?[address], [address] ?[place], [place], [address] ?Phone: [phone]   Fax:  [phone] ? ?Physical Therapy Treatment ? ?Patient Details  ?Name: Joshua Hess ?MRN: [id number] ?Date of Birth: 1940/10/23 ?Referring Provider (PT): Barb Merino, MD ? ? ?Encounter Date: 09/29/2021 ? ? PT End of Session - 09/29/21 1241   ? ? Visit Number 11   ? Number of Visits 17   ? Date for PT Re-Evaluation 10/15/21   ? Authorization Type [employer]   ? Authorization Time Period Cohere approval 16 visits 08/20/2021 to 10/15/2021   ? Authorization - Visit Number 11   ? Authorization - Number of Visits 16   ? Progress Note Due on Visit 20   ? PT Start Time 6073   pt late  ? PT Stop Time 1236   ? PT Time Calculation (min) 45 min   ? Equipment Utilized During Treatment Gait belt   ? Activity Tolerance Patient tolerated treatment well   ? Behavior During Therapy [facility] for tasks assessed/performed;Impulsive   ? ?  ?  ? ?  ? ? ?Past Medical History:  ?Diagnosis Date  ? Aphasia   ? Cancer [facility])   ? behind ear- [place]  ? Carotid artery stenosis   ? Chronotropic incompetence   ? ED (erectile dysfunction)   ? Elevated PSA   ? HOH (hard of hearing)   ? Mild CAD   ? Plantar fasciitis   ? Postoperative atrial fibrillation (HCC)   ? Severe aortic stenosis   ? a. s/p tissue AVR 10/2018.  ? Sinus bradycardia   ? baseline  ? Stroke [facility]) 03/05/2020  ? TIA  ? Wears glasses   ? ? ?Past Surgical History:  ?Procedure Laterality Date  ? AORTIC VALVE REPLACEMENT N/A 11/10/2018  ? Procedure: AORTIC VALVE REPLACEMENT (AVR), USING 23MM INSPIRIS;  Surgeon: Gaye Pollack, MD;  Location: [place];  Service: Open Heart Surgery;  Laterality: N/A;  ? ENDARTERECTOMY Left 03/08/2020  ? Procedure: ENDARTERECTOMY CAROTID;  Surgeon: Rosetta Posner, MD;  Location: [place];  Service: Vascular;  Laterality: Left;  ? EXTERNAL EAR SURGERY    ? melanoma removed  ? HERNIA REPAIR Right   ? inguinal  ? INGUINAL HERNIA REPAIR  Right 11/15/2019  ? Procedure: LAPAROSCOPIC RIGHT INGUINAL HERNIA REPAIR WITH MESH;  Surgeon: Coralie Keens, MD;  Location: [place];  Service: General;  Laterality: Right;  ? RIGHT/LEFT HEART CATH AND CORONARY ANGIOGRAPHY N/A 09/29/2018  ? Procedure: RIGHT/LEFT HEART CATH AND CORONARY ANGIOGRAPHY;  Surgeon: Burnell Blanks, MD;  Location: [place] CV LAB;  Service: Cardiovascular;  Laterality: N/A;  ? TEE WITHOUT CARDIOVERSION N/A 11/10/2018  ? Procedure: TRANSESOPHAGEAL ECHOCARDIOGRAM (TEE);  Surgeon: Gaye Pollack, MD;  Location: [place];  Service: Open Heart Surgery;  Laterality: N/A;  ? TRANSCATHETER AORTIC VALVE REPLACEMENT, TRANSFEMORAL    ? PROCEDURE ATTEMPTED & ABORTED   ? UMBILICAL HERNIA REPAIR N/A 11/15/2019  ? Procedure: UMBILICAL HERNIA REPAIR;  Surgeon: Coralie Keens, MD;  Location: [place];  Service: General;  Laterality: N/A;  ? ? ?There were no vitals filed for this visit. ? ? Subjective Assessment - 09/29/21 1153   ? ? Subjective Have had good days and bad days. took a tumble coming in here- trying to step up on the curb while spouse was getting the walker. denies injuries besides scraping the R knee   ? Pertinent History aortic stenosis status post aortic valve replacement, coronary  artery disease status post endarterectomy, essential tremors, depression and anxiety, HOH, sinus bradycardia, stroke 2021   ? Diagnostic tests none recent   ? Patient Stated Goals improve balance   ? Currently in Pain? No/denies   ? ?  ?  ? ?  ? ? ? ? ? ? ? ? ? ? ? ? ? ? ? ? ? ? ? ? [place] Adult PT Treatment/Exercise - 09/29/21 0001   ? ?  ? Ambulation/Gait  ? Ambulation/Gait Assistance 4: Min guard;5: Supervision   ? Ambulation Distance (Feet) 250 Feet   ? Assistive device None   ? Gait Pattern Step-through pattern;Shuffle   ? Ambulation Surface Level;Indoor;Paved;Outdoor   ? Gait Comments gait training indoors with CGA and frequent cueing for redirection; gait with pivot turns, head turns, heel-toe pattern and  pacing; gait training with 4WW outside on/off ramp and to car as well as gait in parking lot- cues for heel/toe pattern and awareness of not getting too close to obstacles in the environment   ?  ? Neuro Re-ed   ? Neuro Re-ed Details  R/L fwd/back stepping with 1 handrail 10x, romberg + VOR horizontal and vertical 30"   heavy cueing for direction and coordination of movement  ?  ? Lumbar Exercises: Stretches  ? Gastroc Stretch Right;Left;30 seconds;1 rep   ? Gastroc Stretch Limitations runner's stretch   ?  ? Lumbar Exercises: Standing  ? Heel Raises 20 reps   ? Heel Raises Limitations heel/toe raise   ? ?  ?  ? ?  ? ? ? ? ? ? ? ? ? ? PT Education - 09/29/21 1241   ? ? Education Details spoke with patient and spouse about patient's trouble with attention to task and edu on benefits of speech therapy   ? Person(s) Educated Patient;Spouse   ? Methods Explanation   ? Comprehension Verbalized understanding   ? ?  ?  ? ?  ? ? ? PT Short Term Goals - 09/08/21 1149   ? ?  ? PT SHORT TERM GOAL #1  ? Title Patient to perform initial HEP with family assistance.   ? Time 3   ? Period Weeks   ? Status Achieved   ? Target Date 09/10/21   ? ?  ?  ? ?  ? ? ? ? PT Long Term Goals - 09/26/21 1219   ? ?  ? PT LONG TERM GOAL #1  ? Title Patient to perform advanced HEP with family assistance.   ? Baseline Discussed using instructor-leg exercise activities due to cognitive/memory deficits, e.g. group fitness class or use of internet videos with spouse supervising   ? Time 8   ? Period Weeks   ? Status On-going   ? Target Date 10/15/21   ?  ? PT LONG TERM GOAL #2  ? Title Patient to score at least 18/24 on DGI in order to decrease risk of falls.   ? Baseline 18/24 with use of 4WW   ? Time 8   ? Period Weeks   ? Status Achieved   ? Target Date 10/15/21   ?  ? PT LONG TERM GOAL #3  ? Title Patient to report 0/10 dizziness with standing vertical and horizontal head movements at variable speeds.   ? Baseline variable in presentation, mainly  with initial sit to stand   ? Time 8   ? Period Weeks   ? Status On-going   ? Target Date 10/15/21   ?  ?  PT LONG TERM GOAL #4  ? Title Patient to report no falls in the past 3 weeks.   ? Time 8   ? Period Weeks   ? Status Achieved   ? Target Date 10/15/21   ?  ? PT LONG TERM GOAL #5  ? Title Patient will ambulate over outdoor surfaces with LRAD while performing head turns to scan environment with good stability in order to indicate safe community mobility.   ? Baseline use of 4WW and supervision   ? Time 8   ? Period Weeks   ? Status Achieved   ? Target Date 10/15/21   ? ?  ?  ? ?  ? ? ? ? ? ? ? ? Plan - 09/29/21 1242   ? ? Clinical Impression Statement Patient arrived to session with spouse with report that he fell on the way to the clinic d/t trying to get up a curb while spouse was getting the walker out of the car. Denies injuries besides scraping the R knee. R knee bleeding through band aids but was cleaned up. Patient performed gait training indoors/outdoors with challenges including pivot turns, walking up/down ramp, head turns, and encouraging heel-toe pattern and pacing. Worked on static and dynamic balance activities with heavy cues throughout for redirection, attention to task, and coordination of movements. Patient tolerated session well and without complaints at end of session.   ? Personal Factors and Comorbidities Comorbidity 3+;Age;Fitness;Past/Current Experience;Time since onset of injury/illness/exacerbation   ? Comorbidities aortic stenosis status post aortic valve replacement, coronary artery disease status post endarterectomy, essential tremors, depression and anxiety, HOH, sinus bradycardia, stroke 2021   ? Examination-Activity Limitations Locomotion Level;Transfers;Bed Mobility;Reach Overhead;Bathing;Bend;Carry;Squat;Stairs;Dressing;Hygiene/Grooming;Stand;Lift   ? Examination-Participation Restrictions Community Activity;Yard Work;Laundry;Shop;Cleaning;Church;Meal Prep   ? Stability/Clinical  Decision Making Evolving/Moderate complexity   ? Rehab Potential Good   ? PT Frequency 2x / week   ? PT Duration 8 weeks   ? PT Treatment/Interventions ADLs/Self Care Home Management;Canalith Repostioni

## 2021-09-30 DIAGNOSIS — G319 Degenerative disease of nervous system, unspecified: Secondary | ICD-10-CM | POA: Diagnosis not present

## 2021-09-30 DIAGNOSIS — R251 Tremor, unspecified: Secondary | ICD-10-CM | POA: Diagnosis not present

## 2021-09-30 DIAGNOSIS — R2689 Other abnormalities of gait and mobility: Secondary | ICD-10-CM | POA: Diagnosis not present

## 2021-09-30 DIAGNOSIS — R519 Headache, unspecified: Secondary | ICD-10-CM | POA: Diagnosis not present

## 2021-09-30 DIAGNOSIS — R42 Dizziness and giddiness: Secondary | ICD-10-CM | POA: Diagnosis not present

## 2021-10-02 ENCOUNTER — Other Ambulatory Visit: Payer: Self-pay

## 2021-10-02 DIAGNOSIS — I6523 Occlusion and stenosis of bilateral carotid arteries: Secondary | ICD-10-CM

## 2021-10-03 ENCOUNTER — Encounter: Payer: Self-pay | Admitting: Physical Therapy

## 2021-10-03 ENCOUNTER — Ambulatory Visit: Payer: Medicare HMO | Admitting: Physical Therapy

## 2021-10-03 ENCOUNTER — Other Ambulatory Visit: Payer: Self-pay

## 2021-10-03 DIAGNOSIS — M6281 Muscle weakness (generalized): Secondary | ICD-10-CM

## 2021-10-03 DIAGNOSIS — R296 Repeated falls: Secondary | ICD-10-CM | POA: Diagnosis not present

## 2021-10-03 DIAGNOSIS — R2681 Unsteadiness on feet: Secondary | ICD-10-CM

## 2021-10-03 DIAGNOSIS — R42 Dizziness and giddiness: Secondary | ICD-10-CM | POA: Diagnosis not present

## 2021-10-03 DIAGNOSIS — R262 Difficulty in walking, not elsewhere classified: Secondary | ICD-10-CM | POA: Diagnosis not present

## 2021-10-03 NOTE — Therapy (Signed)
[place] [facility] ?[address], [address] ?[place], [place], [address] ?Phone: [phone]   Fax:  [phone] ? ?Physical Therapy Treatment ? ?Patient Details  ?Name: Joshua Hess ?MRN: [id number] ?Date of Birth: Aug 18, 1940 ?Referring Provider (PT): Barb Merino, MD ? ? ?Encounter Date: 10/03/2021 ? ? PT End of Session - 10/03/21 1059   ? ? Visit Number 12   ? Number of Visits 17   ? Date for PT Re-Evaluation 10/15/21   ? Authorization Type [employer] Medicare   ? Authorization Time Period Cohere approval 16 visits 08/20/2021 to 10/15/2021   ? Authorization - Visit Number 12   ? Authorization - Number of Visits 16   ? Progress Note Due on Visit 20   ? PT Start Time 1105   ? PT Stop Time 1148   ? PT Time Calculation (min) 43 min   ? Equipment Utilized During Treatment Gait belt   ? Activity Tolerance Patient tolerated treatment well   ? Behavior During Therapy [facility] for tasks assessed/performed;Impulsive   ? ?  ?  ? ?  ? ? ?Past Medical History:  ?Diagnosis Date  ? Aphasia   ? Cancer [facility])   ? behind ear- [place]  ? Carotid artery stenosis   ? Chronotropic incompetence   ? ED (erectile dysfunction)   ? Elevated PSA   ? HOH (hard of hearing)   ? Mild CAD   ? Plantar fasciitis   ? Postoperative atrial fibrillation (HCC)   ? Severe aortic stenosis   ? a. s/p tissue AVR 10/2018.  ? Sinus bradycardia   ? baseline  ? Stroke [facility]) 03/05/2020  ? TIA  ? Wears glasses   ? ? ?Past Surgical History:  ?Procedure Laterality Date  ? AORTIC VALVE REPLACEMENT N/A 11/10/2018  ? Procedure: AORTIC VALVE REPLACEMENT (AVR), USING 23MM INSPIRIS;  Surgeon: Gaye Pollack, MD;  Location: [place];  Service: Open Heart Surgery;  Laterality: N/A;  ? ENDARTERECTOMY Left 03/08/2020  ? Procedure: ENDARTERECTOMY CAROTID;  Surgeon: Rosetta Posner, MD;  Location: [place];  Service: Vascular;  Laterality: Left;  ? EXTERNAL EAR SURGERY    ? melanoma removed  ? HERNIA REPAIR Right   ? inguinal  ? INGUINAL HERNIA REPAIR Right  11/15/2019  ? Procedure: LAPAROSCOPIC RIGHT INGUINAL HERNIA REPAIR WITH MESH;  Surgeon: Coralie Keens, MD;  Location: [place];  Service: General;  Laterality: Right;  ? RIGHT/LEFT HEART CATH AND CORONARY ANGIOGRAPHY N/A 09/29/2018  ? Procedure: RIGHT/LEFT HEART CATH AND CORONARY ANGIOGRAPHY;  Surgeon: Burnell Blanks, MD;  Location: [place] CV LAB;  Service: Cardiovascular;  Laterality: N/A;  ? TEE WITHOUT CARDIOVERSION N/A 11/10/2018  ? Procedure: TRANSESOPHAGEAL ECHOCARDIOGRAM (TEE);  Surgeon: Gaye Pollack, MD;  Location: [place];  Service: Open Heart Surgery;  Laterality: N/A;  ? TRANSCATHETER AORTIC VALVE REPLACEMENT, TRANSFEMORAL    ? PROCEDURE ATTEMPTED & ABORTED   ? UMBILICAL HERNIA REPAIR N/A 11/15/2019  ? Procedure: UMBILICAL HERNIA REPAIR;  Surgeon: Coralie Keens, MD;  Location: [place];  Service: General;  Laterality: N/A;  ? ? ?There were no vitals filed for this visit. ? ? Subjective Assessment - 10/03/21 1101   ? ? 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 Everette Rank, neurologist at [facility]   ? Pertinent History aortic stenosis status  post aortic valve replacement, coronary artery disease status post endarterectomy, essential tremors, depression and anxiety, HOH, sinus bradycardia, stroke 2021   ? Diagnostic tests none recent   ? Patient Stated Goals improve balance   ? Currently in Pain? No/denies   ? ?  ?  ? ?  ? ? ? ? ? ? ? ? ? ? ? ? ? ? ? ? ? ? ? ? [place] Adult PT Treatment/Exercise - 10/03/21 1112   ? ?  ? Transfers  ? Transfers Sit to Stand;Stand to Sit   ? Number of Reps Other reps (comment)   5  ? Comments no UE support used; pt braced legs against table on 1st rep, but then able to stabilize without bracing legs when instructed to perform without touching mat table   ?  ? Ambulation/Gait  ? Ambulation/Gait Yes   ?  Ambulation/Gait Assistance 4: Min guard   ? Ambulation Distance (Feet) 172 Feet   ? Assistive device Straight cane   ? Gait Pattern Step-through pattern;Shuffle   ? Ambulation Surface Level;Indoor   ? Gait Comments Pt brought walker but did not want to use - used SPC   ?  ? Neuro Re-ed   ? Neuro Re-ed Details  Pt performed standing balance exercises inside // bars with UE support prn; performed alternating forward, back and side kicks 10 reps each;  marching in place 10 reps each with 1 UE support.  Pt performed stepping strategy exercise - stepping forward/ back , out/in and back/up with UE support with frequent cues for redirection and for correct direction with the specific exercise.  Husband present for instruction.   ? ?  ?  ? ?  ? ? ?Access Code: [email or web address] ?URL: [email or web address] ?Date: 10/03/2021 ?Prepared by: Ethelene Browns ? ?Exercises ?- Standing March with Counter Support  - 1 x daily - 7 x weekly - 1 sets - 10 reps ?- Standing Hip Flexion with Counter Support  - 1 x daily - 7 x weekly - 1 sets - 10 reps ?- Standing Hip Extension with Unilateral Counter Support  - 1 x daily - 7 x weekly - 1 sets - 10 reps ?- Standing Hip Abduction with Counter Support  - 1 x daily - 7 x weekly - 1 sets - 10 reps ?- Alternating Step Forward with Support  - 1 x daily - 7 x weekly - 3 sets - 10 reps ?  also do stepping out/in and back/up for stepping strategy - 10 reps each ? ? ? ? ? ? PT Education - 10/03/21 1427   ? ? Education Details Medbridge HEP for balance -   ? Person(s) Educated Patient   ? Methods Explanation;Demonstration;Verbal cues   ? Comprehension Verbalized understanding;Returned demonstration   ? ?  ?  ? ?  ? ? ? PT Short Term Goals - 10/03/21 1100   ? ?  ? PT SHORT TERM GOAL #1  ? Title Patient to perform initial HEP with family assistance.   ? Time 3   ? Period Weeks   ? Status Achieved   ? Target Date 09/10/21   ? ?  ?  ? ?  ? ? ? ? PT Long Term Goals - 10/03/21 1428   ? ?  ? PT LONG TERM  GOAL #1  ? Title Patient to perform advanced HEP with family assistance.   ? Baseline Discussed using instructor-leg exercise activities due to cognitive/memory deficits, e.g. group fitness class or use of  internet videos with spouse supervising   ? Time 8   ? Period Weeks   ? Status On-going   ? Target Date 10/15/21   ?  ? PT LONG TERM GOAL #2  ? Title Patient to score at least 18/24 on DGI in order to decrease risk of falls.   ? Baseline 18/24 with use of 4WW   ? Time 8   ? Period Weeks   ? Status Achieved   ? Target Date 10/15/21   ?  ? PT LONG TERM GOAL #3  ? Title Patient to report 0/10 dizziness with standing vertical and horizontal head movements at variable speeds.   ? Baseline variable in presentation, mainly with initial sit to stand   ? Time 8   ? Period Weeks   ? Status On-going   ? Target Date 10/15/21   ?  ? PT LONG TERM GOAL #4  ? Title Patient to report no falls in the past 3 weeks.   ? Time 8   ? Period Weeks   ? Status Achieved   ? Target Date 10/15/21   ?  ? PT LONG TERM GOAL #5  ? Title Patient will ambulate over outdoor surfaces with LRAD while performing head turns to scan environment with good stability in order to indicate safe community mobility.   ? Baseline use of 4WW and supervision   ? Time 8   ? Period Weeks   ? Status Achieved   ? Target Date 10/15/21   ? ?  ?  ? ?  ? ? ? ? ? ? ? ? Plan - 10/03/21 1428   ? ? Clinical Impression Statement Session focused on HEP for balance exercises with pt's husband present for instruction and for assisting pt with HEP at home.  Pt was recently diagnosed with Lewey Body Dementia; husband and pt request continuation of PT after next week, as pt only has 1 additional appt scheduled as of today's session.  Pt brought rollator to PT but requested to use SPC. Performance fluctuated during session with pt becoming fatigued after approx. 64' and needed seated rest period. Remainder of session focused on issuing balance HEP.  Cont with POC. Plan to renew for  additional PT visits.   ? Personal Factors and Comorbidities Comorbidity 3+;Age;Fitness;Past/Current Experience;Time since onset of injury/illness/exacerbation   ? Comorbidities aortic stenosis status post ao

## 2021-10-08 DIAGNOSIS — Z85820 Personal history of malignant melanoma of skin: Secondary | ICD-10-CM | POA: Diagnosis not present

## 2021-10-08 DIAGNOSIS — C44622 Squamous cell carcinoma of skin of right upper limb, including shoulder: Secondary | ICD-10-CM | POA: Diagnosis not present

## 2021-10-08 DIAGNOSIS — C44722 Squamous cell carcinoma of skin of right lower limb, including hip: Secondary | ICD-10-CM | POA: Diagnosis not present

## 2021-10-08 DIAGNOSIS — L570 Actinic keratosis: Secondary | ICD-10-CM | POA: Diagnosis not present

## 2021-10-08 DIAGNOSIS — L814 Other melanin hyperpigmentation: Secondary | ICD-10-CM | POA: Diagnosis not present

## 2021-10-08 DIAGNOSIS — C44729 Squamous cell carcinoma of skin of left lower limb, including hip: Secondary | ICD-10-CM | POA: Diagnosis not present

## 2021-10-08 DIAGNOSIS — D692 Other nonthrombocytopenic purpura: Secondary | ICD-10-CM | POA: Diagnosis not present

## 2021-10-08 DIAGNOSIS — Z85828 Personal history of other malignant neoplasm of skin: Secondary | ICD-10-CM | POA: Diagnosis not present

## 2021-10-08 DIAGNOSIS — L820 Inflamed seborrheic keratosis: Secondary | ICD-10-CM | POA: Diagnosis not present

## 2021-10-08 DIAGNOSIS — L821 Other seborrheic keratosis: Secondary | ICD-10-CM | POA: Diagnosis not present

## 2021-10-10 ENCOUNTER — Encounter: Payer: Self-pay | Admitting: Physical Therapy

## 2021-10-10 ENCOUNTER — Ambulatory Visit: Payer: Medicare HMO | Admitting: Physical Therapy

## 2021-10-10 DIAGNOSIS — R296 Repeated falls: Secondary | ICD-10-CM | POA: Diagnosis not present

## 2021-10-10 DIAGNOSIS — M6281 Muscle weakness (generalized): Secondary | ICD-10-CM

## 2021-10-10 DIAGNOSIS — R2681 Unsteadiness on feet: Secondary | ICD-10-CM

## 2021-10-10 DIAGNOSIS — R262 Difficulty in walking, not elsewhere classified: Secondary | ICD-10-CM

## 2021-10-10 DIAGNOSIS — R42 Dizziness and giddiness: Secondary | ICD-10-CM

## 2021-10-10 NOTE — Therapy (Signed)
[place] [facility] ?[address], [address] ?[place], [place], [address] ?Phone: [phone]   Fax:  [phone] ? ?Physical Therapy Treatment ? ?Patient Details  ?Name: Joshua Hess ?MRN: [id number] ?Date of Birth: 05-May-1941 ?Referring Provider (PT): Barb Merino, MD ? ? ?Encounter Date: 10/10/2021 ? ? PT End of Session - 10/10/21 1226   ? ? Visit Number 13   ? Number of Visits 21   ? Date for PT Re-Evaluation 54/00/86   cert. ends [id number] for appt availability as needed  ? Authorization Type [employer]   ? Authorization Time Period Cohere approval 16 visits 08/20/2021 to 10/15/2021; 10-13-21 - 11-21-21 for 8 additional visits requested (10-10-21)   ? Authorization - Visit Number 13   ? Authorization - Number of Visits 21   8  additional visits requested on 10-10-21  ? Progress Note Due on Visit 20   ? PT Start Time 1103   ? PT Stop Time 1150   ? PT Time Calculation (min) 47 min   ? Equipment Utilized During Treatment Gait belt   ? Activity Tolerance Patient tolerated treatment well   ? Behavior During Therapy [facility] for tasks assessed/performed;Impulsive   ? ?  ?  ? ?  ? ? ?Past Medical History:  ?Diagnosis Date  ? Aphasia   ? Cancer [facility])   ? behind ear- [place]  ? Carotid artery stenosis   ? Chronotropic incompetence   ? ED (erectile dysfunction)   ? Elevated PSA   ? HOH (hard of hearing)   ? Mild CAD   ? Plantar fasciitis   ? Postoperative atrial fibrillation (HCC)   ? Severe aortic stenosis   ? a. s/p tissue AVR 10/2018.  ? Sinus bradycardia   ? baseline  ? Stroke [facility]) 03/05/2020  ? TIA  ? Wears glasses   ? ? ?Past Surgical History:  ?Procedure Laterality Date  ? AORTIC VALVE REPLACEMENT N/A 11/10/2018  ? Procedure: AORTIC VALVE REPLACEMENT (AVR), USING 23MM INSPIRIS;  Surgeon: Gaye Pollack, MD;  Location: [place];  Service: Open Heart Surgery;  Laterality: N/A;  ? ENDARTERECTOMY Left 03/08/2020  ? Procedure: ENDARTERECTOMY CAROTID;  Surgeon: Rosetta Posner, MD;  Location: [place];   Service: Vascular;  Laterality: Left;  ? EXTERNAL EAR SURGERY    ? melanoma removed  ? HERNIA REPAIR Right   ? inguinal  ? INGUINAL HERNIA REPAIR Right 11/15/2019  ? Procedure: LAPAROSCOPIC RIGHT INGUINAL HERNIA REPAIR WITH MESH;  Surgeon: Coralie Keens, MD;  Location: [place];  Service: General;  Laterality: Right;  ? RIGHT/LEFT HEART CATH AND CORONARY ANGIOGRAPHY N/A 09/29/2018  ? Procedure: RIGHT/LEFT HEART CATH AND CORONARY ANGIOGRAPHY;  Surgeon: Burnell Blanks, MD;  Location: [place] CV LAB;  Service: Cardiovascular;  Laterality: N/A;  ? TEE WITHOUT CARDIOVERSION N/A 11/10/2018  ? Procedure: TRANSESOPHAGEAL ECHOCARDIOGRAM (TEE);  Surgeon: Gaye Pollack, MD;  Location: [place];  Service: Open Heart Surgery;  Laterality: N/A;  ? TRANSCATHETER AORTIC VALVE REPLACEMENT, TRANSFEMORAL    ? PROCEDURE ATTEMPTED & ABORTED   ? UMBILICAL HERNIA REPAIR N/A 11/15/2019  ? Procedure: UMBILICAL HERNIA REPAIR;  Surgeon: Coralie Keens, MD;  Location: [place];  Service: General;  Laterality: N/A;  ? ? ?There were no vitals filed for this visit. ? ? Subjective Assessment - 10/10/21 1107   ? ? Subjective Pt's husband inquires about a back brace for pt to help with his back pain; states Wed. and Thurs were "bad days" -  had trouble standing erect these 2 days.  Pt had MD appt and he used the wheelchair due to difficulty walkingm but yet he was able to walk1/2 mile in neighborhood on Tuesday.  States today is a good day - status just fluctuates   ? Pertinent History aortic stenosis status post aortic valve replacement, coronary artery disease status post endarterectomy, essential tremors, depression and anxiety, HOH, sinus bradycardia, stroke 2021   ? Diagnostic tests none recent   ? Patient Stated Goals improve balance   ? Currently in Pain? Other (Comment)   aware of pain being present if he bent forward: - "i'm aware of pain within the area"  ? ?  ?  ? ?  ? ? ? ? ? ? ? ? ? ? ? ? ? ? ? ? ? ? ? ? [place] [facility]  Treatment/Exercise - 10/10/21 1128   ? ?  ? Transfers  ? Transfers Sit to Stand;Stand to Sit   ? Number of Reps 10 reps   ? Comments no UE support used; pt braced legs against mat table on 1st rep; attempted to assess 5 times sit to stand score but pt unable to follow directions correctly for full return to sitting position - initial 3 sit to stands were performed correctly and then pt did partial squat with return to standing rather than fully sitting between reps   ?  ? Ambulation/Gait  ? Ambulation/Gait Yes   ? Ambulation/Gait Assistance 4: Min guard   ? Ambulation Distance (Feet) 258 Feet   ? Assistive device Straight cane   ? Gait Pattern Step-through pattern;Shuffle   ? Ambulation Surface Level;Indoor   ? Gait Comments Pt used rollator to amb. from lobby to mat in gym   ?  ? Standardized Balance Assessment  ? Standardized Balance Assessment Dynamic Gait Index   ?  ? Dynamic Gait Index  ? Level Surface Mild Impairment   ? Change in Gait Speed Mild Impairment   ? Gait with Horizontal Head Turns Moderate Impairment   ? Gait with Vertical Head Turns Moderate Impairment   ? Gait and Pivot Turn Mild Impairment   pt reported min. dizziness with the turn  ? Step Over Obstacle Moderate Impairment   ? Step Around Obstacles Mild Impairment   ? Steps Mild Impairment   ? Total Score 13   ?  ? Self-Care  ? Self-Care Other Self-Care Comments   ? Other Self-Care Comments  Discussed HEP for low back stretches due to c/o intermittent back pain - husband reports pt is doing stretches as previously instructed; has also been doing balance exercises as issued in previous session as able based on status   ?  ? Neuro Re-ed   ? Neuro Re-ed Details  Pt performed standing horizontal and vertical head turns in unsupported standing (for goal assessment);  pt rated dizziness 2-3/10 with horizontal & 4-5/10 with vertical head turns   ? ?  ?  ? ?  ? ? ? ? ? ? Balance Exercises - 10/10/21 0001   ? ?  ? Balance Exercises: Standing  ? Step Over  Hurdles / Cones pt performed stepping over black 1/2 bolster on floor 10 reps RLE & LLE with RUE support on cabinet   ? ?  ?  ? ?  ? ? ? ? ? PT Education - 10/10/21 1224   ? ? Education Details added stepping over object to HEP - 10 reps each leg with UE support; discussed  doing gaze stabilization exercise in standing but with UE support when pt is having a "good day"; husband to assist prn   ? Person(s) Educated Patient;Spouse   ? Methods Explanation   ? Comprehension Verbalized understanding;Returned demonstration   ? ?  ?  ? ?  ? ? ? PT Short Term Goals - 10/10/21 1231   ? ?  ? PT SHORT TERM GOAL #1  ? Title Patient to perform initial HEP with family assistance.   ? Time 3   ? Period Weeks   ? Status Achieved   ? Target Date 09/10/21   ? ?  ?  ? ?  ? ? ? ? PT Long Term Goals - 10/10/21 1112   ? ?  ? PT LONG TERM GOAL #1  ? Title Patient to perform advanced HEP with family assistance.   ? Baseline Discussed using instructor-leg exercise activities due to cognitive/memory deficits, e.g. group fitness class or use of internet videos with spouse supervising;  10-10-21 - HEP continues to be updated   ? Time 4   ? Period Weeks   ? Status On-going   ? Target Date 11/07/21   ?  ? PT LONG TERM GOAL #2  ? Title Patient to score at least 18/24 on DGI in order to decrease risk of falls.   ? Baseline score 13/24 on 10-10-21   ? Time 4   ? Period Weeks   ? Status On-going   ? Target Date 11/07/21   ?  ? PT LONG TERM GOAL #3  ? Title Patient to report 0/10 dizziness with standing vertical and horizontal head movements at variable speeds.   ? Baseline variable in presentation, mainly with initial sit to stand;    10-10-21 more dizziness with vertical head turns with ratiing of 4-5/10 intensity; horizontal head turns - dizziness rated 4-5/10   ? Time 4   ? Period Weeks   ? Status On-going   ? Target Date 11/07/21   ?  ? PT LONG TERM GOAL #4  ? Title Patient to report no falls in the past 3 weeks.   ? Baseline pt fell on 09-29-21  coming into clinic   ? Time 4   ? Period Weeks   ? Status On-going   ? Target Date 11/07/21   ?  ? PT LONG TERM GOAL #5  ? Title Patient will ambulate over outdoor surfaces with LRAD while performing head turns to

## 2021-10-14 DIAGNOSIS — F03918 Unspecified dementia, unspecified severity, with other behavioral disturbance: Secondary | ICD-10-CM | POA: Diagnosis not present

## 2021-10-14 DIAGNOSIS — F5104 Psychophysiologic insomnia: Secondary | ICD-10-CM | POA: Diagnosis not present

## 2021-10-14 DIAGNOSIS — Z8673 Personal history of transient ischemic attack (TIA), and cerebral infarction without residual deficits: Secondary | ICD-10-CM | POA: Diagnosis not present

## 2021-10-14 DIAGNOSIS — F32A Depression, unspecified: Secondary | ICD-10-CM | POA: Diagnosis not present

## 2021-10-15 ENCOUNTER — Ambulatory Visit: Payer: Medicare HMO | Attending: Internal Medicine | Admitting: Physical Therapy

## 2021-10-15 ENCOUNTER — Encounter: Payer: Self-pay | Admitting: Physical Therapy

## 2021-10-15 DIAGNOSIS — R2681 Unsteadiness on feet: Secondary | ICD-10-CM | POA: Diagnosis not present

## 2021-10-15 DIAGNOSIS — R262 Difficulty in walking, not elsewhere classified: Secondary | ICD-10-CM | POA: Insufficient documentation

## 2021-10-15 DIAGNOSIS — R296 Repeated falls: Secondary | ICD-10-CM | POA: Diagnosis not present

## 2021-10-15 DIAGNOSIS — M6281 Muscle weakness (generalized): Secondary | ICD-10-CM | POA: Diagnosis not present

## 2021-10-15 DIAGNOSIS — R42 Dizziness and giddiness: Secondary | ICD-10-CM | POA: Insufficient documentation

## 2021-10-15 NOTE — Therapy (Signed)
[place] ?[facility] ?[address], [address] ?[place], [place], [address] ?Phone: [phone]   Fax:  [phone] ? ?Physical Therapy Treatment ? ?Patient Details  ?Name: Joshua Hess ?MRN: [id number] ?Date of Birth: [birth year] ?Referring Provider (PT): Barb Merino, MD ? ? ?Encounter Date: 10/15/2021 ? ? PT End of Session - 10/15/21 1545   ? ? Visit Number 14   ? Number of Visits 21   ? Date for PT Re-Evaluation 32/12/24   cert. ends 03-07-99 for appt availability as needed  ? Authorization Type [employer]   ? Authorization Time Period Cohere approval 16 visits 08/20/2021 to 10/15/2021; 10-13-21 - 11-21-21 for 8 additional visits requested (10-10-21)   ? Authorization - Visit Number 14   ? Authorization - Number of Visits 21   8  additional visits requested on 10-10-21  ? Progress Note Due on Visit 20   ? PT Start Time 1452   ? PT Stop Time 3704   ? PT Time Calculation (min) 47 min   ? Equipment Utilized During Treatment Gait belt   ? Activity Tolerance Patient tolerated treatment well;Other (comment)   dizziness  ? Behavior During Therapy [facility] for tasks assessed/performed;Impulsive   ? ?  ?  ? ?  ? ? ?Past Medical History:  ?Diagnosis Date  ? Aphasia   ? Cancer [facility])   ? behind ear- [place]  ? Carotid artery stenosis   ? Chronotropic incompetence   ? ED (erectile dysfunction)   ? Elevated PSA   ? HOH (hard of hearing)   ? Mild CAD   ? Plantar fasciitis   ? Postoperative atrial fibrillation (HCC)   ? Severe aortic stenosis   ? a. s/p tissue AVR 10/2018.  ? Sinus bradycardia   ? baseline  ? Stroke [facility]) 03/05/2020  ? TIA  ? Wears glasses   ? ? ?Past Surgical History:  ?Procedure Laterality Date  ? AORTIC VALVE REPLACEMENT N/A 11/10/2018  ? Procedure: AORTIC VALVE REPLACEMENT (AVR), USING 23MM INSPIRIS;  Surgeon: Gaye Pollack, MD;  Location: [place];  Service: Open Heart Surgery;  Laterality: N/A;  ? ENDARTERECTOMY Left 03/08/2020  ? Procedure: ENDARTERECTOMY CAROTID;  Surgeon: Rosetta Posner, MD;  Location: [place];  Service: Vascular;  Laterality: Left;  ? EXTERNAL EAR SURGERY    ? melanoma removed  ? HERNIA REPAIR Right   ? inguinal  ? INGUINAL HERNIA REPAIR Right 11/15/2019  ? Procedure: LAPAROSCOPIC RIGHT INGUINAL HERNIA REPAIR WITH MESH;  Surgeon: Coralie Keens, MD;  Location: [place];  Service: General;  Laterality: Right;  ? RIGHT/LEFT HEART CATH AND CORONARY ANGIOGRAPHY N/A 09/29/2018  ? Procedure: RIGHT/LEFT HEART CATH AND CORONARY ANGIOGRAPHY;  Surgeon: Burnell Blanks, MD;  Location: [place] CV [facility];  Service: Cardiovascular;  Laterality: N/A;  ? TEE WITHOUT CARDIOVERSION N/A 11/10/2018  ? Procedure: TRANSESOPHAGEAL ECHOCARDIOGRAM (TEE);  Surgeon: Gaye Pollack, MD;  Location: [place];  Service: Open Heart Surgery;  Laterality: N/A;  ? TRANSCATHETER AORTIC VALVE REPLACEMENT, TRANSFEMORAL    ? PROCEDURE ATTEMPTED & ABORTED   ? UMBILICAL HERNIA REPAIR N/A 11/15/2019  ? Procedure: UMBILICAL HERNIA REPAIR;  Surgeon: Coralie Keens, MD;  Location: [place];  Service: General;  Laterality: N/A;  ? ? ?There were no vitals filed for this visit. ? ? Subjective Assessment - 10/15/21 1456   ? ? Subjective Had a fall on Monday during the middle of the night- hudband found him asleep on the floor. Was able  to help him off the floor. Having a bad day. Has an appointment on April 13 to figure out how far along dementia is. back is hurting- reports unsure if back brace is comfortable.   ? Patient is accompained by: Family member   ? Pertinent History aortic stenosis status post aortic valve replacement, coronary artery disease status post endarterectomy, essential tremors, depression and anxiety, HOH, sinus bradycardia, stroke 2021   ? Diagnostic tests none recent   ? Patient Stated Goals improve balance   ? Currently in Pain? Yes   ? Pain Score --   unable to score  ? Pain Location Back   ? Pain Descriptors / Indicators Aching   ? Pain Type Acute pain;Chronic pain   ? ?  ?  ? ?   ? ? ? ? ? ? ? ? ? ? ? ? ? ? ? ? ? ? ? ? [place] [facility] PT Treatment/Exercise - 10/15/21 0001   ? ?  ? Ambulation/Gait  ? Ambulation/Gait Assistance 4: Min assist   ? Ambulation Distance (Feet) 300 Feet   ? Assistive device 4-wheeled walker;Rolling walker   ? Gait Pattern Step-through pattern;Shuffle   ? Ambulation Surface Level;Indoor   ? Gait Comments cueing throughout to "march" in order to increase step height and length, intermittent cues to stop and reset when leaning too far forward and avoiding walker being pushed too far forward, edu on safe hand placement and use of brakes with transfers from mat; trialed RW with less success   ?  ? Neuro Re-ed   ? Neuro Re-ed Details  fwd/back stepping over 1/2 foam roll with 1 UE on sink and 1 HHA   heavy cueing for increased step length, foot placement; discontinued d/t c/o dizziness  ? ?  ?  ? ?  ? ? ? ? ? ? ? ? ? ? PT Education - 10/15/21 1544   ? ? Education Details discussion with spouse on benefits of caregivers in the home or adult daycare facilities for caregiver respite; educated on max use of safety equipment patinet already owns for safety; edu on electrolyte drinks for hydration and hopeful prevention of muscle cramps   ? Person(s) Educated Patient;Spouse   ? Methods Explanation;Demonstration;Tactile cues;Verbal cues   ? Comprehension Verbalized understanding   ? ?  ?  ? ?  ? ? ? PT Short Term Goals - 10/10/21 1231   ? ?  ? PT SHORT TERM GOAL #1  ? Title Patient to perform initial HEP with family assistance.   ? Time 3   ? Period Weeks   ? Status Achieved   ? Target Date 09/10/21   ? ?  ?  ? ?  ? ? ? ? PT Long Term Goals - 10/15/21 1554   ? ?  ? PT LONG TERM GOAL #1  ? Title Patient to perform advanced HEP with family assistance.   ? Baseline Discussed using instructor-leg exercise activities due to cognitive/memory deficits, e.g. group fitness class or use of internet videos with spouse supervising;  10-10-21 - HEP continues to be updated   ? Time 4   ? Period  Weeks   ? Status On-going   ? Target Date 11/07/21   ?  ? PT LONG TERM GOAL #2  ? Title Patient to score at least 18/24 on DGI in order to decrease risk of falls.   ? Baseline score 13/24 on 10-10-21   ? Time 4   ? Period Weeks   ?  Status On-going   ? Target Date 11/07/21   ?  ? PT LONG TERM GOAL #3  ? Title Patient to report 0/10 dizziness with standing vertical and horizontal head movements at variable speeds.   ? Baseline variable in presentation, mainly with initial sit to stand;    10-10-21 more dizziness with vertical head turns with ratiing of 4-5/10 intensity; horizontal head turns - dizziness rated 4-5/10   ? Time 4   ? Period Weeks   ? Status On-going   ? Target Date 11/07/21   ?  ? PT LONG TERM GOAL #4  ? Title Patient to report no falls in the past 3 weeks.   ? Baseline pt fell on 09-29-21 coming into clinic   ? Time 4   ? Period Weeks   ? Status On-going   ? Target Date 11/07/21   ?  ? PT LONG TERM GOAL #5  ? Title Patient will ambulate over outdoor surfaces with LRAD while performing head turns to scan environment with good stability in order to indicate safe community mobility.   ? Baseline use of 4WW and supervision; performance fluctuates per husband's report; states pt had a good day on Tuesday this week and walked 1/2 mile in neighborhood but then had bad days on Wed. and Thursday   ? Time 4   ? Period Weeks   ? Status On-going   ? Target Date 11/07/21   ?  ? PT LONG TERM GOAL #6  ? Title Improve TUG score from 18.15 to </= 15 secs with SPC to reduce fall risk.   ? Baseline 18.15 secs with SPC on 10-10-21   ? Time 4   ? Period Weeks   ? Status On-going   ? Target Date 11/07/21   ? ?  ?  ? ?  ? ? ? ? ? ? ? ? Plan - 10/15/21 1546   ? ? Clinical Impression Statement Patient arrived to session with spouse, who reported increased trouble with mobility today. Patient ambulating with 4WW but having trouble maneuvering and ambulating without shuffling. Husband reports a fall on Monday but reports that he was  able to assist patient off the floor. No reported injuries. Took time to answer spouse?s questions about patient?s current issues and ability to care for the patient at home. Majority of session focused on gait training with

## 2021-10-21 ENCOUNTER — Emergency Department (HOSPITAL_COMMUNITY): Payer: Medicare HMO

## 2021-10-21 ENCOUNTER — Emergency Department (HOSPITAL_COMMUNITY)
Admission: EM | Admit: 2021-10-21 | Discharge: 2021-10-23 | Disposition: A | Discharge status: Home / Self Care | Payer: Medicare HMO | Source: Home / Self Care | Attending: Emergency Medicine | Admitting: Emergency Medicine

## 2021-10-21 ENCOUNTER — Other Ambulatory Visit: Payer: Self-pay

## 2021-10-21 ENCOUNTER — Ambulatory Visit: Payer: Medicare HMO | Admitting: Physical Therapy

## 2021-10-21 ENCOUNTER — Encounter (HOSPITAL_COMMUNITY): Payer: Self-pay

## 2021-10-21 DIAGNOSIS — R400 Somnolence: Secondary | ICD-10-CM | POA: Diagnosis not present

## 2021-10-21 DIAGNOSIS — Z8249 Family history of ischemic heart disease and other diseases of the circulatory system: Secondary | ICD-10-CM | POA: Diagnosis not present

## 2021-10-21 DIAGNOSIS — Z139 Encounter for screening, unspecified: Secondary | ICD-10-CM | POA: Diagnosis not present

## 2021-10-21 DIAGNOSIS — R296 Repeated falls: Secondary | ICD-10-CM | POA: Diagnosis not present

## 2021-10-21 DIAGNOSIS — Z66 Do not resuscitate: Secondary | ICD-10-CM | POA: Diagnosis not present

## 2021-10-21 DIAGNOSIS — F02C11 Dementia in other diseases classified elsewhere, severe, with agitation: Secondary | ICD-10-CM | POA: Diagnosis not present

## 2021-10-21 DIAGNOSIS — Z7982 Long term (current) use of aspirin: Secondary | ICD-10-CM | POA: Insufficient documentation

## 2021-10-21 DIAGNOSIS — Z751 Person awaiting admission to adequate facility elsewhere: Secondary | ICD-10-CM | POA: Diagnosis not present

## 2021-10-21 DIAGNOSIS — F419 Anxiety disorder, unspecified: Secondary | ICD-10-CM | POA: Diagnosis present

## 2021-10-21 DIAGNOSIS — S50311A Abrasion of right elbow, initial encounter: Secondary | ICD-10-CM | POA: Insufficient documentation

## 2021-10-21 DIAGNOSIS — W06XXXA Fall from bed, initial encounter: Secondary | ICD-10-CM | POA: Diagnosis present

## 2021-10-21 DIAGNOSIS — S0181XA Laceration without foreign body of other part of head, initial encounter: Secondary | ICD-10-CM | POA: Diagnosis not present

## 2021-10-21 DIAGNOSIS — F062 Psychotic disorder with delusions due to known physiological condition: Secondary | ICD-10-CM | POA: Diagnosis not present

## 2021-10-21 DIAGNOSIS — G3183 Dementia with Lewy bodies: Secondary | ICD-10-CM | POA: Insufficient documentation

## 2021-10-21 DIAGNOSIS — Z043 Encounter for examination and observation following other accident: Secondary | ICD-10-CM | POA: Diagnosis not present

## 2021-10-21 DIAGNOSIS — M25561 Pain in right knee: Secondary | ICD-10-CM | POA: Diagnosis not present

## 2021-10-21 DIAGNOSIS — F0283 Dementia in other diseases classified elsewhere, unspecified severity, with mood disturbance: Secondary | ICD-10-CM | POA: Diagnosis not present

## 2021-10-21 DIAGNOSIS — F02B0 Dementia in other diseases classified elsewhere, moderate, without behavioral disturbance, psychotic disturbance, mood disturbance, and anxiety: Secondary | ICD-10-CM

## 2021-10-21 DIAGNOSIS — S199XXA Unspecified injury of neck, initial encounter: Secondary | ICD-10-CM | POA: Diagnosis not present

## 2021-10-21 DIAGNOSIS — Z7189 Other specified counseling: Secondary | ICD-10-CM | POA: Diagnosis not present

## 2021-10-21 DIAGNOSIS — R58 Hemorrhage, not elsewhere classified: Secondary | ICD-10-CM | POA: Diagnosis not present

## 2021-10-21 DIAGNOSIS — R079 Chest pain, unspecified: Secondary | ICD-10-CM | POA: Diagnosis not present

## 2021-10-21 DIAGNOSIS — W19XXXA Unspecified fall, initial encounter: Secondary | ICD-10-CM | POA: Insufficient documentation

## 2021-10-21 DIAGNOSIS — R001 Bradycardia, unspecified: Secondary | ICD-10-CM | POA: Diagnosis not present

## 2021-10-21 DIAGNOSIS — M25521 Pain in right elbow: Secondary | ICD-10-CM | POA: Diagnosis not present

## 2021-10-21 DIAGNOSIS — S80212A Abrasion, left knee, initial encounter: Secondary | ICD-10-CM | POA: Insufficient documentation

## 2021-10-21 DIAGNOSIS — M4312 Spondylolisthesis, cervical region: Secondary | ICD-10-CM | POA: Diagnosis not present

## 2021-10-21 DIAGNOSIS — R627 Adult failure to thrive: Secondary | ICD-10-CM | POA: Diagnosis not present

## 2021-10-21 DIAGNOSIS — I2510 Atherosclerotic heart disease of native coronary artery without angina pectoris: Secondary | ICD-10-CM | POA: Diagnosis present

## 2021-10-21 DIAGNOSIS — Z6826 Body mass index (BMI) 26.0-26.9, adult: Secondary | ICD-10-CM | POA: Diagnosis not present

## 2021-10-21 DIAGNOSIS — Z952 Presence of prosthetic heart valve: Secondary | ICD-10-CM | POA: Diagnosis not present

## 2021-10-21 DIAGNOSIS — G319 Degenerative disease of nervous system, unspecified: Secondary | ICD-10-CM | POA: Diagnosis not present

## 2021-10-21 DIAGNOSIS — R64 Cachexia: Secondary | ICD-10-CM | POA: Diagnosis not present

## 2021-10-21 DIAGNOSIS — E860 Dehydration: Secondary | ICD-10-CM | POA: Diagnosis present

## 2021-10-21 DIAGNOSIS — G250 Essential tremor: Secondary | ICD-10-CM | POA: Diagnosis present

## 2021-10-21 DIAGNOSIS — N179 Acute kidney failure, unspecified: Secondary | ICD-10-CM | POA: Diagnosis not present

## 2021-10-21 DIAGNOSIS — S80211A Abrasion, right knee, initial encounter: Secondary | ICD-10-CM | POA: Diagnosis present

## 2021-10-21 DIAGNOSIS — M25562 Pain in left knee: Secondary | ICD-10-CM | POA: Diagnosis not present

## 2021-10-21 DIAGNOSIS — F32A Depression, unspecified: Secondary | ICD-10-CM | POA: Diagnosis present

## 2021-10-21 DIAGNOSIS — Z85820 Personal history of malignant melanoma of skin: Secondary | ICD-10-CM | POA: Diagnosis not present

## 2021-10-21 DIAGNOSIS — F0280 Dementia in other diseases classified elsewhere without behavioral disturbance: Secondary | ICD-10-CM | POA: Diagnosis not present

## 2021-10-21 DIAGNOSIS — G9341 Metabolic encephalopathy: Secondary | ICD-10-CM | POA: Diagnosis not present

## 2021-10-21 DIAGNOSIS — S0990XA Unspecified injury of head, initial encounter: Secondary | ICD-10-CM | POA: Diagnosis not present

## 2021-10-21 DIAGNOSIS — I6782 Cerebral ischemia: Secondary | ICD-10-CM | POA: Diagnosis not present

## 2021-10-21 DIAGNOSIS — Z515 Encounter for palliative care: Secondary | ICD-10-CM | POA: Diagnosis not present

## 2021-10-21 DIAGNOSIS — I6523 Occlusion and stenosis of bilateral carotid arteries: Secondary | ICD-10-CM | POA: Diagnosis not present

## 2021-10-21 LAB — CBC WITH DIFFERENTIAL/PLATELET
Abs Immature Granulocytes: 0.02 10*3/uL (ref 0.00–0.07)
Basophils Absolute: 0 10*3/uL (ref 0.0–0.1)
Basophils Relative: 0 %
Eosinophils Absolute: 0.1 10*3/uL (ref 0.0–0.5)
Eosinophils Relative: 2 %
HCT: 42.3 % (ref 39.0–52.0)
Hemoglobin: 14.6 g/dL (ref 13.0–17.0)
Immature Granulocytes: 0 %
Lymphocytes Relative: 19 %
Lymphs Abs: 1.2 10*3/uL (ref 0.7–4.0)
MCH: 33.1 pg (ref 26.0–34.0)
MCHC: 34.5 g/dL (ref 30.0–36.0)
MCV: 95.9 fL (ref 80.0–100.0)
Monocytes Absolute: 0.7 10*3/uL (ref 0.1–1.0)
Monocytes Relative: 10 %
Neutro Abs: 4.4 10*3/uL (ref 1.7–7.7)
Neutrophils Relative %: 69 %
Platelets: 166 10*3/uL (ref 150–400)
RBC: 4.41 MIL/uL (ref 4.22–5.81)
RDW: 11.9 % (ref 11.5–15.5)
WBC: 6.4 10*3/uL (ref 4.0–10.5)
nRBC: 0 % (ref 0.0–0.2)

## 2021-10-21 LAB — COMPREHENSIVE METABOLIC PANEL
ALT: 30 U/L (ref 0–44)
AST: 28 U/L (ref 15–41)
Albumin: 3.6 g/dL (ref 3.5–5.0)
Alkaline Phosphatase: 51 U/L (ref 38–126)
Anion gap: 5 (ref 5–15)
BUN: 20 mg/dL (ref 8–23)
CO2: 28 mmol/L (ref 22–32)
Calcium: 8.9 mg/dL (ref 8.9–10.3)
Chloride: 107 mmol/L (ref 98–111)
Creatinine, Ser: 0.91 mg/dL (ref 0.61–1.24)
GFR, Estimated: >60 mL/min (ref >60)
Glucose, Bld: 95 mg/dL (ref 70–99)
Potassium: 4 mmol/L (ref 3.5–5.1)
Sodium: 140 mmol/L (ref 135–145)
Total Bilirubin: 1.1 mg/dL (ref 0.3–1.2)
Total Protein: 6.5 g/dL (ref 6.5–8.1)

## 2021-10-21 LAB — URINALYSIS, ROUTINE W REFLEX MICROSCOPIC
Bacteria, UA: NONE SEEN
Bilirubin Urine: NEGATIVE
Glucose, UA: NEGATIVE mg/dL
Ketones, ur: 20 mg/dL — AB
Leukocytes,Ua: NEGATIVE
Nitrite: NEGATIVE
Protein, ur: NEGATIVE mg/dL
Specific Gravity, Urine: 1.014 (ref 1.005–1.030)
pH: 6 (ref 5.0–8.0)

## 2021-10-21 MED ORDER — MAGNESIUM HYDROXIDE 400 MG/5ML PO SUSP
30.0000 mL | Freq: Every day | ORAL | Status: DC | PRN
  Filled 2021-10-21: qty 30

## 2021-10-21 MED ORDER — APOAEQUORIN 10 MG PO CAPS: 10.0000 mg | ORAL_CAPSULE | Freq: Every day | ORAL | Status: DC

## 2021-10-21 MED ORDER — POLYETHYLENE GLYCOL 3350 17 G PO PACK
17.0000 g | PACK | Freq: Every day | ORAL | Status: DC
  Administered 2021-10-21 – 2021-10-23 (×3): 17 g via ORAL
  Filled 2021-10-21 (×3): qty 1

## 2021-10-21 MED ORDER — PITAVASTATIN MAGNESIUM 4 MG PO TABS: 1.0000 | ORAL_TABLET | Freq: Every day | ORAL | Status: DC

## 2021-10-21 MED ORDER — ROSUVASTATIN CALCIUM 10 MG PO TABS
10.0000 mg | ORAL_TABLET | Freq: Every day | ORAL | Status: DC
Start: 2021-10-21 — End: 2021-10-23
  Administered 2021-10-21 – 2021-10-22 (×2): 10 mg via ORAL
  Filled 2021-10-21 (×2): qty 1

## 2021-10-21 MED ORDER — ASPIRIN EC 81 MG PO TBEC
81.0000 mg | DELAYED_RELEASE_TABLET | Freq: Every day | ORAL | Status: DC
  Administered 2021-10-21 – 2021-10-23 (×3): 81 mg via ORAL
  Filled 2021-10-21 (×4): qty 1

## 2021-10-21 MED ORDER — DULOXETINE HCL 30 MG PO CPEP
60.0000 mg | ORAL_CAPSULE | Freq: Every day | ORAL | Status: DC
  Administered 2021-10-21 – 2021-10-23 (×3): 60 mg via ORAL
  Filled 2021-10-21 (×3): qty 2

## 2021-10-21 MED ORDER — OLANZAPINE 5 MG PO TABS
5.0000 mg | ORAL_TABLET | Freq: Every day | ORAL | Status: DC
  Administered 2021-10-21 – 2021-10-22 (×2): 5 mg via ORAL
  Filled 2021-10-21 (×2): qty 1

## 2021-10-21 MED ORDER — ALPRAZOLAM 0.25 MG PO TABS
0.2500 mg | ORAL_TABLET | Freq: Every evening | ORAL | Status: DC | PRN
  Administered 2021-10-21 – 2021-10-22 (×2): 0.5 mg via ORAL
  Filled 2021-10-21 (×2): qty 2

## 2021-10-21 MED ORDER — ESCITALOPRAM OXALATE 10 MG PO TABS
20.0000 mg | ORAL_TABLET | Freq: Every day | ORAL | Status: DC
Start: 2021-10-21 — End: 2021-10-21

## 2021-10-21 MED ORDER — TRAZODONE HCL 50 MG PO TABS
150.0000 mg | ORAL_TABLET | Freq: Every day | ORAL | Status: DC
  Administered 2021-10-21 – 2021-10-22 (×2): 150 mg via ORAL
  Filled 2021-10-21 (×2): qty 1

## 2021-10-21 NOTE — ED Notes (Signed)
Patient transported to CT 

## 2021-10-21 NOTE — ED Notes (Signed)
Pt continually having to be redirected to stay in the bed.  Pt is fixated on getting up.  ?

## 2021-10-21 NOTE — Progress Notes (Signed)
RNCM spoke with [facility], who reports family is wanting patient to go to [facility]. This RNCM advised Joshua Hess that PT order has been placed and we are awaiting PT evaluation for possible SNF placement. Catalina Antigua is working on his end to have patient placed long term at [facility]. This RNCM did advise Joshua Hess that the ED/TOC department can not place patient for long term care, Catalina Antigua acknowledged understanding. ? ?TOC will continue to follow. ?

## 2021-10-21 NOTE — ED Notes (Signed)
Pt transported via wheelchair.  Maximum 2 staff assist getting Pt into bed.   ?

## 2021-10-21 NOTE — ED Notes (Signed)
Per TOC, family is considering [facility] because they are familiar with the facility.  PT eval required for placement.  PT order placed earlier, but has not been completed.  ?

## 2021-10-21 NOTE — ED Notes (Signed)
Pt took pills w/ fat free milk.  Moderate assist needed.  ?

## 2021-10-21 NOTE — ED Notes (Signed)
Patient very confused and trying to get OOB and leave but need 2+ assist for safety. Asked for approval from charge RN and [facility] to ask his significant other, Joshua Hess, to come and stay with him to keep him calm and safe. I got approval and called Joshua Hess and he was very thankful that we asked him to come and help.  ?

## 2021-10-21 NOTE — ED Notes (Signed)
Pharmacy tech at bedside 

## 2021-10-21 NOTE — ED Provider Notes (Signed)
?[facility] DEPT ?Provider Note ? ? ?CSN: [id number] ?Arrival date & time: 10/21/21  1043 ? ?  ? ?History ? ?No chief complaint on file. ? ? ?Joshua Hess is a 81 y.o. male. ? ?81 year old male with prior medical history as detailed below presents via EMS transport from home.  Patient with multiple falls over the last week.  Patient fell apparently twice this morning.  Patient without specific acute complaint upon evaluation.  Patient does have a history of dementia.  Patient is limited in his ability to provide significant history. ? ?Patient with abrasions to the right elbow and both knees. ? ?Patient denies current headache, chest pain, shortness of breath. ? ?Per EMS, patient's family is in route to the hospital as well. ? ?Additional history obtained from the patient's family at bedside.  Patient with 1 caregiver at home.  Caregiver reports that patient has been diagnosed with Lewy body dementia.  Patient was rapidly worsening symptoms over the last 2 to 3 weeks.  Patient with significant difficulty ambulating and with significant inability to perform ADLs.  Patient's family is requesting evaluation for possible placement. ? ?The history is provided by the patient, medical records and the EMS personnel.  ?Illness ?Location:  Multiple recent falls ?Severity:  Mild ?Onset quality:  Unable to specify ?Timing:  Unable to specify ?Progression:  Unable to specify ?Chronicity:  Recurrent ? ?  ? ?Home Medications ?Prior to Admission medications   ?Medication Sig Start Date End Date Taking? Authorizing Provider  ?ALPRAZolam (XANAX) 0.5 MG tablet Take 1 tablet by mouth at bedtime as needed for sleep (as directed). 10/23/20   [provider]  ?amoxicillin (AMOXIL) 500 MG capsule TAKE 4 CAPSULES BY MOUTH 1-2 HOURS PRIOR TO DENTAL APPOINTMENT 07/18/21   Burnell Blanks, MD  ?Apoaequorin 10 MG CAPS Take 10 mg by mouth daily.    [provider]  ?aspirin EC 81 MG EC tablet  Take 1 tablet (81 mg total) by mouth daily. Swallow whole. 03/10/20   Raiford Noble Latif, DO  ?escitalopram (LEXAPRO) 20 MG tablet Take 1 tablet by mouth daily. 03/22/21   [provider]  ?fluoruracil (CARAC) 0.5 % cream as directed.    [provider]  ?magnesium hydroxide (MILK OF MAGNESIA) 400 MG/5ML suspension Take 30 mLs by mouth daily as needed for mild constipation.    [provider]  ?oxyCODONE (OXY IR/ROXICODONE) 5 MG immediate release tablet Take 1 tablet (5 mg total) by mouth every 6 (six) hours as needed for severe pain. ?Patient not taking: Reported on 08/20/2021 08/09/21   Johnathan Hausen, MD  ?polyethylene glycol (MIRALAX / GLYCOLAX) 17 g packet Take 17 g by mouth daily. 11/22/19   Kayleen Memos, DO  ?traZODone (DESYREL) 50 MG tablet Take 3 tablets by mouth at bedtime. 05/28/21   [provider]  ?ZYPITAMAG 4 MG TABS TAKE 1 TABLET BY MOUTH DAILY. ?Patient taking differently: Take 1 tablet by mouth daily. 04/14/21   Gregor Hams, MD  ?   ? ?Allergies    ?Flomax [tamsulosin]   ? ?Review of Systems   ?Review of Systems  ?All other systems reviewed and are negative. ? ?Physical Exam ?Updated Vital Signs ?BP (!) 137/98 (BP Location: Right Arm)   Pulse 63   Temp 98.1 ?F (36.7 ?C) (Oral)   Resp 17   Ht '5\' 8"'$  (1.727 m)   Wt 80 kg   SpO2 97%   BMI 26.82 kg/m?  ?Physical Exam ?Vitals  and nursing note reviewed.  ?Constitutional:   ?   General: He is not in acute distress. ?   Appearance: Normal appearance. He is well-developed.  ?HENT:  ?   Head: Normocephalic and atraumatic.  ?Eyes:  ?   Conjunctiva/sclera: Conjunctivae normal.  ?   Pupils: Pupils are equal, round, and reactive to light.  ?Cardiovascular:  ?   Rate and Rhythm: Normal rate and regular rhythm.  ?   Heart sounds: Normal heart sounds.  ?Pulmonary:  ?   Effort: Pulmonary effort is normal. No respiratory distress.  ?   Breath sounds: Normal breath sounds.  ?Abdominal:  ?   General: There is no distension.  ?    Palpations: Abdomen is soft.  ?   Tenderness: There is no abdominal tenderness.  ?Musculoskeletal:     ?   General: No deformity. Normal range of motion.  ?   Cervical back: Normal range of motion and neck supple.  ?Skin: ?   General: Skin is warm and dry.  ?Neurological:  ?   General: No focal deficit present.  ?   Mental Status: He is alert and oriented to person, place, and time.  ? ? ?ED Results / Procedures / Treatments   ?Labs ?(all labs ordered are listed, but only abnormal results are displayed) ?Labs Reviewed  ?COMPREHENSIVE METABOLIC PANEL  ?CBC WITH DIFFERENTIAL/PLATELET  ?URINALYSIS, ROUTINE W REFLEX MICROSCOPIC  ? ? ?EKG ?None ? ?Radiology ?No results found. ? ?Procedures ?Procedures  ? ? ?Medications Ordered in ED ?Medications - No data to display ? ?ED Course/ Medical Decision Making/ A&P ?  ?                        ?Medical Decision Making ?Amount and/or Complexity of Data Reviewed ?Labs: ordered. ?Radiology: ordered. ? ? ? ?Medical Screen Complete ? ?This patient presented to the ED with complaint of frequent falls. ? ?This complaint involves an extensive number of treatment options. The initial differential diagnosis includes, but is not limited to, injury secondary to fall, metabolic disorder, etc. ? ?This presentation is: Acute, Chronic, Self-Limited, Previously Undiagnosed, Uncertain Prognosis, Complicated, Systemic Symptoms, and Threat to Life/Bodily Function ? ?Patient with prior history of dementia.  Patient is presenting for evaluation after multiple recent falls. ? ?Patient without complaint of injury or pain upon evaluation. ? ?Screening imaging obtained is without evidence of acute abnormality. ? ?Screening labs obtained are without significant abnormality ? ?Patient's family and caregiver are requesting TOC evaluation for possible placement. ? ? ?SW/TOC aware of case. ? ? ? ? ?Co morbidities that complicated the patient's evaluation ? ?Dementia  ? ? ?Additional history  obtained: ? ?Additional history obtained from EMS ?External records from outside sources obtained and reviewed including prior ED visits and prior Inpatient records.  ? ? ?Lab Tests: ? ?I ordered and personally interpreted labs.  The pertinent results include:  CBC CMP UA ? ? ?Imaging Studies ordered: ? ?I ordered imaging studies including CT head,  plain films of pelvis, elbow, knees, chest ?I independently visualized and interpreted obtained imaging which showed no acute pathology ?I agree with the radiologist interpretation. ? ? ?Cardiac Monitoring: ? ?The patient was maintained on a cardiac monitor.  I personally viewed and interpreted the cardiac monitor which showed an underlying rhythm of: Normal sinus rhythm ? ? ?Problem List / ED Course: ? ?Dementia ? ? ?Reevaluation: ? ?After the interventions noted above, I reevaluated the patient and found that they have:  stayed the same ? ?Disposition: ? ?After consideration of the diagnostic results and the patients response to treatment, I feel that the patent would benefit from likely placement.  ? ? ? ? ? ? ? ? ?Final Clinical Impression(s) / ED Diagnoses ?Final diagnoses:  ?Moderate Lewy body dementia, unspecified whether behavioral, psychotic, or mood disturbance or anxiety ([place])  ? ? ?Rx / DC Orders ?ED Discharge Orders   ? ? None  ? ?  ? ? ?  ?Valarie Merino, MD ?10/21/21 1449 ? ?

## 2021-10-21 NOTE — ED Notes (Signed)
Note left requesting Pharmacy Tech reconcile home medications.  Family at bedside.  ?

## 2021-10-21 NOTE — Progress Notes (Signed)
.  Transition of Care [facility]) - Emergency Department Mini Assessment ? ? ?Patient Details  ?Name: Joshua Hess ?MRN: [id number] ?Date of Birth: 25-Aug-1940 ? ?Transition of Care (TOC) CM/SW Contact:    ?Arlie Solomons Ryka Beighley, LCSW ?Phone Number: ?10/21/2021, 5:08 PM ? ? ?Clinical Narrative: ? ?CSW spoke with pt and pt's spouse [name], they reported looking into [facility]. CSW informed the family Nanine Means has contacted TOC and is reviewing pt's chart for possible placement. CSW informed pt and spouse a PT eval was ordered to see what level of care pt meets. CSW explained the placement process for short-term rehab through the ED. Pt reported never being placed in SNF before and has no preferred facility. Pt stated he would like to stay in the [facility] area if at all possible. Placement pending PT eval. TOC to follow.  ? ? ?ED Mini Assessment: ?What brought you to the Emergency Department? : Fall ? ?Barriers to Discharge: Continued Medical Work up (PT eval) ? ?  ? ?  ? ?Interventions which prevented an admission or readmission: SNF Placement ? ? ? ?Patient Contact and Communications ?  ?  ?  ? ,     ?  ?  ? ?  ?  ?  ? ?Admission diagnosis:  fall;elbow and knee pain ?Patient Active Problem List  ? Diagnosis Date Noted  ? Rib fracture 08/09/2021  ? Syncope 08/08/2021  ? Rib fractures 08/08/2021  ? Pain due to onychomycosis of toenails of both feet 04/30/2021  ? Cognitive impairment 11/11/2020  ? Depression with anxiety 11/11/2020  ? Carotid artery disease ([place]) 11/11/2020  ? Essential tremor 11/11/2020  ? Bunionette of left foot 06/20/2020  ? Fatigue 06/20/2020  ? History of left-sided carotid endarterectomy 05/27/2020  ? Facial droop 03/05/2020  ? Mild CAD   ? Acute encephalopathy 11/18/2019  ? Acute urinary retention 11/18/2019  ? Post-operative state 11/18/2019  ? Aphasia 08/07/2019  ? S/P AVR (aortic valve replacement)   ? Carotid artery stenosis   ? Chronotropic incompetence   ? Severe aortic stenosis    ? Sinus bradycardia 08/12/2018  ? Malignant melanoma of scalp ([place]) 09/22/2015  ? Benign prostatic hyperplasia with nocturia 11/01/2012  ? Enlarged prostate with lower urinary tract symptoms (LUTS) 11/01/2012  ? ?PCP:  [name], MD ?Pharmacy:   ?[id number] - [place], [place] ?[place] [address] ?Phone: [phone] Fax: [phone] ? ?[place], [place] - [address] ?[place] ?[place] [address] ?Phone: [phone] Fax: [phone] ?  ?

## 2021-10-21 NOTE — ED Notes (Signed)
Pt provided ice water and apple sauce per request.  ?

## 2021-10-21 NOTE — ED Notes (Signed)
TOC at bedside 

## 2021-10-21 NOTE — ED Triage Notes (Signed)
Patient BIB [facility] from home. Patient c/o multiple falls throughout the past week. Today patient fell 2x. Patient is c/o left elbow pain and bilateral knee pain. Patient denies hitting head. No LOC. Family is interested in SNF placement. ? ?Vital signs were:  ?138/90 ?62-HR ?18-RR ?99% room air ?99- CBG ?

## 2021-10-22 ENCOUNTER — Emergency Department (HOSPITAL_COMMUNITY): Payer: Medicare HMO

## 2021-10-22 DIAGNOSIS — F062 Psychotic disorder with delusions due to known physiological condition: Secondary | ICD-10-CM | POA: Diagnosis not present

## 2021-10-22 DIAGNOSIS — G3183 Dementia with Lewy bodies: Secondary | ICD-10-CM | POA: Diagnosis not present

## 2021-10-22 MED ORDER — LORAZEPAM 0.5 MG PO TABS
0.5000 mg | ORAL_TABLET | ORAL | Status: DC | PRN
  Administered 2021-10-22: 0.5 mg via ORAL
  Filled 2021-10-22: qty 1

## 2021-10-22 MED ORDER — LORAZEPAM 0.5 MG PO TABS
0.5000 mg | ORAL_TABLET | ORAL | Status: DC | PRN
  Administered 2021-10-22: 0.5 mg via ORAL
  Filled 2021-10-22 (×2): qty 1

## 2021-10-22 MED ORDER — ENSURE ENLIVE PO LIQD
237.0000 mL | Freq: Two times a day (BID) | ORAL | Status: DC
  Administered 2021-10-22 – 2021-10-23 (×3): 237 mL via ORAL
  Filled 2021-10-22 (×6): qty 237

## 2021-10-22 NOTE — ED Notes (Signed)
Patient has been alert this shift. Medication compliant. Cooperative with care. Patient resting comfortably at this time. ?

## 2021-10-22 NOTE — Progress Notes (Signed)
FL2 emailed to [employer] at [place], who reviewed [facility] and said it would work for them.  MD signed and Phoebe Perch was resent to [place] at [place]. ?Lurline Idol, MSW, LCSW ?4/12/20239:17 PM  ?

## 2021-10-22 NOTE — Progress Notes (Signed)
PHARMACIST - PHYSICIAN ORDER COMMUNICATION ? ?CONCERNING: P&T Medication Policy on Herbal Medications ? ?DESCRIPTION:  This patient?s order for:  Prevagen (apoaequorin)  has been noted. ? ?This product(s) is classified as an ?herbal? or natural product. ?Due to a lack of definitive safety studies or FDA approval, nonstandard manufacturing practices, plus the potential risk of unknown drug-drug interactions while on inpatient medications, the Pharmacy and Therapeutics Committee does not permit the use of ?herbal? or natural products of this type within [facility]. ?  ?ACTION TAKEN: ?The pharmacy department is unable to verify this order at this time and your patient has been informed of this safety policy. ?Please reevaluate patient?s clinical condition at discharge and address if the herbal or natural product(s) should be resumed at that time.  ?

## 2021-10-22 NOTE — ED Provider Notes (Signed)
Emergency Medicine Observation Re-evaluation Note ? ?Joshua Hess is a 81 y.o. male, seen on rounds today.  Pt initially presented to the ED for complaints of No chief complaint on file. ?Currently, the patient is pacing in room. ? ?Physical Exam  ?BP 114/64 (BP Location: Right Arm)   Pulse 74   Temp (!) 97.2 ?F (36.2 ?C) (Oral)   Resp 16   Ht '5\' 8"'$  (1.727 m)   Wt 80 kg   SpO2 96%   BMI 26.82 kg/m?  ?Physical Exam ?General: Awake, alert, nondistressed ?Cardiac: Extremities well perfused ?Lungs: Unlabored breathing ?Psych: No agitation ? ?ED Course / MDM  ?EKG:  ? ?I have reviewed the labs performed to date as well as medications administered while in observation.  Recent changes in the last 24 hours include patient presented to the ED yesterday due to progressive dementia and multiple falls.  Currently awaiting social work disposition for possible placement. ? ?Plan  ?Current plan is for facility placement. ? Joshua Hess is not under involuntary commitment. ? ? ?  ?Godfrey Pick, MD ?10/22/21 1117 ? ?

## 2021-10-22 NOTE — [place] FL2 (Addendum)
?[place] MEDICAID FL2 LEVEL OF CARE SCREENING TOOL  ?  ? ?IDENTIFICATION  ?Patient Name: ?Joshua Hess Birthdate: 13-May-1941 Sex: male Admission Date (Current Location): ?10/21/2021  ?[place] and [place] Number: ? [place] ?  Facility and Address:  ?[facility],  [place], [place] ?     Provider Number: ?[id number]  ?Attending Physician Name and Address:  ?Default, Provider, MD ? Relative Name and Phone Number:  ?Marcelino Duster [phone] ?   ?Current Level of Care: ?Hospital Recommended Level of Care: ?Memory Care Prior Approval Number: ?  ? ?Date Approved/Denied: ?  PASRR Number: ?  ? ?Discharge Plan: ?Other (Comment) (Memory Care) ?  ? ?Current Diagnoses: ?Patient Active Problem List  ? Diagnosis Date Noted  ? Rib fracture 08/09/2021  ? Syncope 08/08/2021  ? Rib fractures 08/08/2021  ? Pain due to onychomycosis of toenails of both feet 04/30/2021  ? Cognitive impairment 11/11/2020  ? Depression with anxiety 11/11/2020  ? Carotid artery disease ([place]) 11/11/2020  ? Essential tremor 11/11/2020  ? Bunionette of left foot 06/20/2020  ? Fatigue 06/20/2020  ? History of left-sided carotid endarterectomy 05/27/2020  ? Facial droop 03/05/2020  ? Mild CAD   ? Acute encephalopathy 11/18/2019  ? Acute urinary retention 11/18/2019  ? Post-operative state 11/18/2019  ? Aphasia 08/07/2019  ? S/P AVR (aortic valve replacement)   ? Carotid artery stenosis   ? Chronotropic incompetence   ? Severe aortic stenosis   ? Sinus bradycardia 08/12/2018  ? Malignant melanoma of scalp ([place]) 09/22/2015  ? Benign prostatic hyperplasia with nocturia 11/01/2012  ? Enlarged prostate with lower urinary tract symptoms (LUTS) 11/01/2012  ? ? ?Orientation RESPIRATION BLADDER Height & Weight   ?  ?Self, Time, Place ? Normal Incontinent Weight: 176 lb 5.9 oz (80 kg) ?Height:  '5\' 8"'$  (172.7 cm)  ?BEHAVIORAL SYMPTOMS/MOOD NEUROLOGICAL BOWEL NUTRITION STATUS  ?    Continent Diet  ?AMBULATORY STATUS COMMUNICATION OF  NEEDS Skin   ?Extensive Assist Verbally Normal ?  ?  ?  ?    ?     ?     ? ? ?Personal Care Assistance Level of Assistance  ?Bathing, Feeding, Dressing Bathing Assistance: Maximum assistance ?Feeding assistance: Limited assistance ?Dressing Assistance: Maximum assistance ?   ? ?Functional Limitations Info  ?Sight, Hearing, Speech Sight Info: Adequate ?Hearing Info: Adequate ?Speech Info: Adequate  ? ? ?SPECIAL CARE FACTORS FREQUENCY  ?PT (By licensed PT), OT (By licensed OT)   ?  ?PT Frequency: assess and treat ?OT Frequency: assess and treat ?  ?  ?  ?   ? ? ?Contractures Contractures Info: Not present  ? ? ?Additional Factors Info  ?Code Status, Allergies Code Status Info: full ?Allergies Info: Flomax (Tamsulosin) ?  ?  ?  ?   ? ?Current Medications (10/22/2021):  This is the current hospital active medication list ?Current Facility-Administered Medications  ?Medication Dose Route Frequency Provider Last Rate Last Admin  ? ALPRAZolam Duanne Moron) tablet 0.25-0.5 mg  0.25-0.5 mg Oral QHS PRN Regan Lemming, MD   0.5 mg at 10/21/21 2123  ? aspirin EC tablet 81 mg  81 mg Oral Daily Valarie Merino, MD   81 mg at 10/22/21 1004  ? DULoxetine (CYMBALTA) DR capsule 60 mg  60 mg Oral Daily Regan Lemming, MD   60 mg at 10/22/21 1004  ? feeding supplement (ENSURE ENLIVE / ENSURE PLUS) liquid 237 mL  237 mL Oral BID BM Godfrey Pick, MD   237  mL at 10/22/21 1317  ? LORazepam (ATIVAN) tablet 0.5 mg  0.5 mg Oral Q4H PRN Godfrey Pick, MD      ? magnesium hydroxide (MILK OF MAGNESIA) suspension 30 mL  30 mL Oral Daily PRN Valarie Merino, MD      ? OLANZapine (ZYPREXA) tablet 5 mg  5 mg Oral QHS Regan Lemming, MD   5 mg at 10/21/21 2123  ? polyethylene glycol (MIRALAX / GLYCOLAX) packet 17 g  17 g Oral Daily Valarie Merino, MD   17 g at 10/22/21 1004  ? rosuvastatin (CRESTOR) tablet 10 mg  10 mg Oral QHS Regan Lemming, MD   10 mg at 10/21/21 2122  ? traZODone (DESYREL) tablet 150 mg  150 mg Oral QHS Regan Lemming, MD   150 mg at  10/21/21 2122  ? ?Current Outpatient Medications  ?Medication Sig Dispense Refill  ? ALPRAZolam (XANAX) 0.5 MG tablet Take 0.5-1 tablets by mouth at bedtime as needed for sleep (as directed).    ? amoxicillin (AMOXIL) 500 MG capsule TAKE 4 CAPSULES BY MOUTH 1-2 HOURS PRIOR TO DENTAL APPOINTMENT 12 capsule 6  ? Apoaequorin 10 MG CAPS Take 10 mg by mouth daily.    ? aspirin EC 81 MG EC tablet Take 1 tablet (81 mg total) by mouth daily. Swallow whole. 21 tablet 0  ? DULoxetine (CYMBALTA) 30 MG capsule Take 60 mg by mouth in the morning.    ? fluorouracil (EFUDEX) 5 % cream Apply 1 application. topically 2 (two) times daily as needed (skin protection).    ? lidocaine (XYLOCAINE) 5 % ointment Apply 1 application. topically in the morning and at bedtime.    ? magnesium hydroxide (MILK OF MAGNESIA) 400 MG/5ML suspension Take 30 mLs by mouth daily as needed for mild constipation.    ? OLANZapine (ZYPREXA) 5 MG tablet Take 5 mg by mouth at bedtime.    ? polyethylene glycol (MIRALAX / GLYCOLAX) 17 g packet Take 17 g by mouth daily. 14 each 0  ? rosuvastatin (CRESTOR) 10 MG tablet Take 10 mg by mouth at bedtime.    ? tadalafil (CIALIS) 20 MG tablet Take 20 mg by mouth daily as needed for erectile dysfunction.    ? traZODone (DESYREL) 50 MG tablet Take 3 tablets by mouth at bedtime.    ? oxyCODONE (OXY IR/ROXICODONE) 5 MG immediate release tablet Take 1 tablet (5 mg total) by mouth every 6 (six) hours as needed for severe pain. (Patient not taking: Reported on 08/20/2021) 15 tablet 0  ? ZYPITAMAG 4 MG TABS TAKE 1 TABLET BY MOUTH DAILY. (Patient not taking: Reported on 10/21/2021) 90 tablet 1  ? ? ? ?Discharge Medications: ?Please see discharge summary for a list of discharge medications. ? ?Relevant Imaging Results: ? ?Relevant Lab Results: ? ? ?Additional Information ?SSN: [id number] ? ?Joanne Chars, LCSW ? ? ? ? ?

## 2021-10-22 NOTE — Progress Notes (Signed)
CSW spoke with pt and his spouse to discuss PT's evaluation results.  Pt's spouse stated he did not want pt to d/c to SNF. CSW reminded pt and spouse of the placement process. CSW reminded pt and spouse, if pt is needing LTC placement they would have to go through their Primary care doctor. Pt's spouse stated, ?He is not going home, and will not go to a skilled facility?. Pt's spouse requested to speak with a patient advocate. CSW escalated the situation to [facility] leadership. TOC to follow. ? ? ?Arlie Solomons.Keltin Baird, MSW, [place] ?[place]  Transitions of Care ?Clinical Social Worker I ?Direct Dial: [phone]  Fax: [phone] ?[email or web address]  ?

## 2021-10-22 NOTE — Evaluation (Signed)
?Physical Therapy Evaluation ?Patient Details ?Name: Joshua Hess ?MRN: [id number] ?DOB: [birth year] ?Today's Date: 10/22/2021 ? ?History of Present Illness ? 81 year old male with prior medical history AS, CAD, S/P endarterectomy, tremors, rib fractures, dementia, falls. Presents via EMS transport from home on 10/21/21 .  Patient with multiple falls over the last week.  Marland Kitchen  ?Clinical Impression ? The patient  was aroused  from nap, spouse present to provide information. Patient currently requires max assistance of 2 to safely stand and ambulate x   12' using RW and requires much extra time to complete task. Patient demonstrates significant festinating gait, requiring  constant balance support. Patient has had multiple falls recently and will require close  observation 24/7 for safety. Pt admitted with above diagnosis.  ? Pt currently with functional limitations due to the deficits listed below (see PT Problem List). Pt will benefit from skilled PT to increase their independence and safety with mobility to allow discharge to the venue listed below.   ? ?   ? ?Recommendations for follow up therapy are one component of a multi-disciplinary discharge planning process, led by the attending physician.  Recommendations may be updated based on patient status, additional functional criteria and insurance authorization. ? ?Follow Up Recommendations Skilled nursing-short term rehab (<3 hours/day) ? ?  ?Assistance Recommended at Discharge Frequent or constant Supervision/Assistance  ?Patient can return home with the following ? Two people to help with walking and/or transfers;Two people to help with bathing/dressing/bathroom;Assistance with feeding;Help with stairs or ramp for entrance;Assist for transportation;Assistance with cooking/housework ? ?  ?Equipment Recommendations None recommended by PT  ?Recommendations for Other Services ?    ?  ?Functional Status Assessment Patient has had a recent decline in their functional status  and demonstrates the ability to make significant improvements in function in a reasonable and predictable amount of time.  ? ?  ?Precautions / Restrictions Precautions ?Precautions: Fall  ? ?  ? ?Mobility ? Bed Mobility ?Overal bed mobility: Needs Assistance ?Bed Mobility: Supine to Sit, Sit to Supine ?  ?  ?Supine to sit: Max assist ?Sit to supine: Total assist ?  ?General bed mobility comments: spouse assisted  placing legs over bed edge, patient pulled up with spouses hands to sitting. Patient required max assist  for legs and fell backwards on bed when legs lifted. ?  ? ?Transfers ?Overall transfer level: Needs assistance ?Equipment used: Rolling walker (2 wheels) ?Transfers: Sit to/from Stand ?Sit to Stand: Max assist, +2 safety/equipment, +2 physical assistance ?  ?  ?  ?  ?  ?General transfer comment: Pulled to stand with spouse assisting, Steady assist to grab RW. ?  ? ?Ambulation/Gait ?Ambulation/Gait assistance: Mod assist, +2 physical assistance, +2 safety/equipment ?Gait Distance (Feet): 12 Feet ?Assistive device: Rolling walker (2 wheels) ?Gait Pattern/deviations: Step-to pattern, Decreased weight shift to left, Staggering left, Staggering right, Festinating ?  ?  ?  ?General Gait Details: patient demonstrates festinating  gait, improves slightly when facilitated to WS  to advance the RW and take longer steps. ? ?Stairs ?  ?  ?  ?  ?  ? ?Wheelchair Mobility ?  ? ?Modified Rankin (Stroke Patients Only) ?  ? ?  ? ?Balance Overall balance assessment: Needs assistance, History of Falls ?Sitting-balance support: Feet supported, No upper extremity supported ?Sitting balance-Leahy Scale: Poor ?  ?Postural control: Posterior lean ?Standing balance support: Bilateral upper extremity supported, During functional activity, Reliant on assistive device for balance ?Standing balance-Leahy Scale: Zero ?Standing balance comment: external  support to balance ?  ?  ?  ?  ?  ?  ?  ?  ?  ?  ?  ?   ? ? ? ?Pertinent  Vitals/Pain Pain Assessment ?Pain Assessment: Faces ?Faces Pain Scale: No hurt  ? ? ?Home Living Family/patient expects to be discharged to:: Private residence ?Living Arrangements: Spouse/significant other ?Available Help at Discharge: Family ?Type of Home: House ?Home Access: Stairs to enter ?Entrance Stairs-Rails: Right;Left;Can reach both ?Entrance Stairs-Number of Steps: 3 ?  ?Home Layout: Two level;Able to live on main level with bedroom/bathroom ?Home Equipment: Cane - quad;Rollator (4 wheels);Grab bars - tub/shower;Shower seat ?   ?  ?Prior Function   ?  ?  ?  ?  ?  ?  ?Mobility Comments: Per spouse, has been requiring assistance to stand and  walk, variable ability and  assist level. has been attending OPPT ?ADLs Comments: assist by family ?  ? ? ?Hand Dominance  ? Dominant Hand: Right ? ?  ?Extremity/Trunk Assessment  ? Upper Extremity Assessment ?Upper Extremity Assessment: Overall WFL for tasks assessed ?  ? ?Lower Extremity Assessment ?Lower Extremity Assessment:  (generally stiffness, rigidity) ?  ? ?Cervical / Trunk Assessment ?Cervical / Trunk Assessment: Kyphotic  ?Communication  ? Communication: Expressive difficulties;Receptive difficulties  ?Cognition Arousal/Alertness: Awake/alert ?Behavior During Therapy: Flat affect ?Overall Cognitive Status: Impaired/Different from baseline ?Area of Impairment: Orientation, Attention, Memory, Following commands, Safety/judgement, Problem solving ?  ?  ?  ?  ?  ?  ?  ?  ?Orientation Level: Time, Situation ?Current Attention Level: Focused ?Memory: Decreased short-term memory ?Following Commands: Follows one step commands inconsistently ?Safety/Judgement: Decreased awareness of safety ?  ?Problem Solving: Slow processing, Decreased initiation ?General Comments: did not state spouses name when asked, Stated cone, march ?  ?  ? ?  ?General Comments   ? ?  ?Exercises    ? ?Assessment/Plan  ?  ?PT Assessment Patient needs continued PT services  ?PT Problem List  Decreased mobility;Decreased safety awareness;Decreased range of motion;Decreased coordination;Impaired tone;Decreased activity tolerance;Decreased cognition;Decreased balance;Decreased knowledge of use of DME ? ?   ?  ?PT Treatment Interventions DME instruction;Therapeutic activities;Cognitive remediation;Gait training;Therapeutic exercise;Patient/family education;Functional mobility training   ? ?PT Goals (Current goals can be found in the Care Plan section)  ?Acute Rehab PT Goals ?Patient Stated Goal: to go to ALF ?PT Goal Formulation: With family ?Time For Goal Achievement: 11/05/21 ?Potential to Achieve Goals: Fair ? ?  ?Frequency Min 2X/week ?  ? ? ?Co-evaluation   ?  ?  ?  ?  ? ? ?  ?AM-PAC PT "6 Clicks" Mobility  ?Outcome Measure Help needed turning from your back to your side while in a flat bed without using bedrails?: Total ?Help needed moving from lying on your back to sitting on the side of a flat bed without using bedrails?: Total ?Help needed moving to and from a bed to a chair (including a wheelchair)?: Total ?Help needed standing up from a chair using your arms (e.g., wheelchair or bedside chair)?: Total ?Help needed to walk in hospital room?: Total ?Help needed climbing 3-5 steps with a railing? : Total ?6 Click Score: 6 ? ?  ?End of Session Equipment Utilized During Treatment: Gait belt ?Activity Tolerance: Patient tolerated treatment well;Other (comment) ?Patient left: in bed;with call bell/phone within reach;with bed alarm set;with family/visitor present ?Nurse Communication: Mobility status ?PT Visit Diagnosis: Unsteadiness on feet (R26.81);History of falling (Z91.81);Apraxia (R48.2) ?  ? ?Time: 6578-4696 ?PT Time Calculation (min) (  ACUTE ONLY): 32 min ? ? ?Charges:   PT Evaluation ?$PT Eval Low Complexity: 1 Low ?PT Treatments ?$Gait Training: 8-22 mins ?  ?   ? ? ?Tresa Endo PT ?Acute Rehabilitation Services ?Pager [phone] ?Office [phone] ? ? ?Fynley Chrystal, Shella Maxim ?10/22/2021, 3:35  PM ?

## 2021-10-22 NOTE — Progress Notes (Addendum)
This RNCM was notified by [facility] SW that patient's partner Joshua Hess has refused to take patient home and SNF placement. Patient's partner wants him to be placed at [facility], despite hospital recommendation for SNF. Joshua Hess is requesting to speak with patient advocate.  ? ?RNCM spoke with Joshua Hess at [facility] who states Joshua Hess will have an available bed tomorrow 10/23/21 at 8am. This RNCM explained to Joshua Hess that this is not the normal process for placement. In the future we will not be able to assist due to the ED is for acute services not this type of placement that can be done within the community. Joshua Hess verbalized understanding.  ? ?RNCM spoke with patient's partner Joshua Hess to advise of the placement process in the ED. Advised long term placement is not done during an acute ED visit, this is done by going to PCP to have placement done within the community. PT has recommended SNF placement. Patient's partner Joshua Hess is tearful stating "he does not know what to do because the patient continues to fall and he does not wish to speak with patient experience, he is pleased with the patient's care."  ?This RNCM notified EDP to request CXR or TB test.  ? ?Joshua Hess with [facility] request this RNCM's email address to send addendum FL2, this RNCM advised we can not complete [facility]'s FL2.  ? ?TOC will continue to follow.  ?

## 2021-10-23 ENCOUNTER — Emergency Department (HOSPITAL_COMMUNITY): Payer: Medicare HMO

## 2021-10-23 ENCOUNTER — Inpatient Hospital Stay (HOSPITAL_COMMUNITY)
Admission: EM | Admit: 2021-10-23 | Discharge: 2021-10-27 | DRG: 951 | Disposition: A | Discharge status: Hospice | Payer: Medicare HMO | Source: Skilled Nursing Facility | Attending: Internal Medicine | Admitting: Internal Medicine

## 2021-10-23 DIAGNOSIS — I2510 Atherosclerotic heart disease of native coronary artery without angina pectoris: Secondary | ICD-10-CM | POA: Diagnosis present

## 2021-10-23 DIAGNOSIS — Z751 Person awaiting admission to adequate facility elsewhere: Secondary | ICD-10-CM

## 2021-10-23 DIAGNOSIS — Z7189 Other specified counseling: Secondary | ICD-10-CM

## 2021-10-23 DIAGNOSIS — S50311A Abrasion of right elbow, initial encounter: Secondary | ICD-10-CM | POA: Diagnosis present

## 2021-10-23 DIAGNOSIS — S0181XA Laceration without foreign body of other part of head, initial encounter: Secondary | ICD-10-CM | POA: Diagnosis present

## 2021-10-23 DIAGNOSIS — N179 Acute kidney failure, unspecified: Principal | ICD-10-CM | POA: Diagnosis present

## 2021-10-23 DIAGNOSIS — Z7982 Long term (current) use of aspirin: Secondary | ICD-10-CM

## 2021-10-23 DIAGNOSIS — S80212A Abrasion, left knee, initial encounter: Secondary | ICD-10-CM | POA: Diagnosis present

## 2021-10-23 DIAGNOSIS — Z85820 Personal history of malignant melanoma of skin: Secondary | ICD-10-CM

## 2021-10-23 DIAGNOSIS — Z515 Encounter for palliative care: Principal | ICD-10-CM

## 2021-10-23 DIAGNOSIS — G250 Essential tremor: Secondary | ICD-10-CM | POA: Diagnosis present

## 2021-10-23 DIAGNOSIS — Z952 Presence of prosthetic heart valve: Secondary | ICD-10-CM

## 2021-10-23 DIAGNOSIS — R64 Cachexia: Secondary | ICD-10-CM | POA: Diagnosis present

## 2021-10-23 DIAGNOSIS — W06XXXA Fall from bed, initial encounter: Secondary | ICD-10-CM | POA: Diagnosis present

## 2021-10-23 DIAGNOSIS — Z6826 Body mass index (BMI) 26.0-26.9, adult: Secondary | ICD-10-CM

## 2021-10-23 DIAGNOSIS — F32A Depression, unspecified: Secondary | ICD-10-CM | POA: Diagnosis present

## 2021-10-23 DIAGNOSIS — W19XXXA Unspecified fall, initial encounter: Secondary | ICD-10-CM | POA: Diagnosis present

## 2021-10-23 DIAGNOSIS — F0283 Dementia in other diseases classified elsewhere, unspecified severity, with mood disturbance: Secondary | ICD-10-CM | POA: Diagnosis present

## 2021-10-23 DIAGNOSIS — R296 Repeated falls: Secondary | ICD-10-CM | POA: Diagnosis present

## 2021-10-23 DIAGNOSIS — F39 Unspecified mood [affective] disorder: Secondary | ICD-10-CM

## 2021-10-23 DIAGNOSIS — G9341 Metabolic encephalopathy: Secondary | ICD-10-CM | POA: Diagnosis present

## 2021-10-23 DIAGNOSIS — Z66 Do not resuscitate: Secondary | ICD-10-CM | POA: Diagnosis present

## 2021-10-23 DIAGNOSIS — F419 Anxiety disorder, unspecified: Secondary | ICD-10-CM | POA: Diagnosis present

## 2021-10-23 DIAGNOSIS — Z8673 Personal history of transient ischemic attack (TIA), and cerebral infarction without residual deficits: Secondary | ICD-10-CM

## 2021-10-23 DIAGNOSIS — G3183 Dementia with Lewy bodies: Secondary | ICD-10-CM | POA: Diagnosis present

## 2021-10-23 DIAGNOSIS — Z8249 Family history of ischemic heart disease and other diseases of the circulatory system: Secondary | ICD-10-CM

## 2021-10-23 DIAGNOSIS — E860 Dehydration: Secondary | ICD-10-CM | POA: Diagnosis present

## 2021-10-23 DIAGNOSIS — S80211A Abrasion, right knee, initial encounter: Secondary | ICD-10-CM | POA: Diagnosis present

## 2021-10-23 DIAGNOSIS — R627 Adult failure to thrive: Secondary | ICD-10-CM | POA: Diagnosis present

## 2021-10-23 LAB — CBC
HCT: 48.2 % (ref 39.0–52.0)
Hemoglobin: 17 g/dL (ref 13.0–17.0)
MCH: 33.7 pg (ref 26.0–34.0)
MCHC: 35.3 g/dL (ref 30.0–36.0)
MCV: 95.6 fL (ref 80.0–100.0)
Platelets: 212 10*3/uL (ref 150–400)
RBC: 5.04 MIL/uL (ref 4.22–5.81)
RDW: 11.9 % (ref 11.5–15.5)
WBC: 9.8 10*3/uL (ref 4.0–10.5)
nRBC: 0 % (ref 0.0–0.2)

## 2021-10-23 LAB — BASIC METABOLIC PANEL
Anion gap: 11 (ref 5–15)
BUN: 31 mg/dL — ABNORMAL HIGH (ref 8–23)
CO2: 23 mmol/L (ref 22–32)
Calcium: 9.6 mg/dL (ref 8.9–10.3)
Chloride: 103 mmol/L (ref 98–111)
Creatinine, Ser: 1.72 mg/dL — ABNORMAL HIGH (ref 0.61–1.24)
GFR, Estimated: 39 mL/min — ABNORMAL LOW (ref >60)
Glucose, Bld: 115 mg/dL — ABNORMAL HIGH (ref 70–99)
Potassium: 4.6 mmol/L (ref 3.5–5.1)
Sodium: 137 mmol/L (ref 135–145)

## 2021-10-23 MED ORDER — LORAZEPAM 2 MG/ML IJ SOLN
0.5000 mg | Freq: Once | INTRAMUSCULAR | Status: AC
  Administered 2021-10-23: 0.5 mg via INTRAVENOUS
  Filled 2021-10-23: qty 1

## 2021-10-23 MED ORDER — ACETAMINOPHEN 325 MG PO TABS
650.0000 mg | ORAL_TABLET | Freq: Once | ORAL | Status: DC
  Filled 2021-10-23: qty 2

## 2021-10-23 MED ORDER — TUBERCULIN PPD 5 UNIT/0.1ML ID SOLN
5.0000 [IU] | INTRADERMAL | Status: DC
Start: 2021-10-23 — End: 2021-10-23
  Administered 2021-10-23: 5 [IU] via INTRADERMAL
  Filled 2021-10-23: qty 0.1

## 2021-10-23 NOTE — Progress Notes (Addendum)
CSW spoke with Janett Billow from [facility], she reported pt can not d/c this morning at 8am. She stated she is trying to work with pt's PCP to get paperwork signed. Per Jessica's she stated she will tried to get pt out at 10am the latest. CSW reminded Janett Billow the placement process of the ED. TOC to follow.  ? ? ? ?Adden  ?CSW received a message from [facility] with [facility], she is requesting TB results.pt received TB blood test, this will take a few days to come back.  ? ?CSW spoke with Assistant Director Clarise Cruz, to discuss pt, she stated she will order pt TB skin test for pt to d/c to [facility]. TOC to follow.   ? ?10:51am ?TB skin done, jessica from [facility] , stated pt's spouse [name] will provide transportation. Call reported [phone].  ? ?Arlie Solomons.Zyiere Rosemond, MSW, [place] ?[facility]  Transitions of Care ?Clinical Social Worker I ?Direct Dial: [phone]  Fax: [phone] ?[email or web address]  ?

## 2021-10-23 NOTE — ED Triage Notes (Addendum)
BIB . Unwitnessed fall between 1930-2100. SNF. 1 inch lac to forehead. No thinners- just asa  verified by family. Not acting any different according to staff- takes trazadone at night and behavior is 'normal'. Family on the way. Today was first day in new facility - [facility]. ?

## 2021-10-23 NOTE — ED Provider Notes (Signed)
Emergency Medicine Observation Re-evaluation Note ? ?Joshua Hess is a 81 y.o. male, seen on rounds today.  Pt initially presented to the ED for complaints of No chief complaint on file. ?Currently, the patient is awaiting placement. ? ?Physical Exam  ?BP (!) 159/87 (BP Location: Right Arm)   Pulse 86   Temp 97.8 ?F (36.6 ?C) (Oral)   Resp 18   Ht '5\' 8"'$  (1.727 m)   Wt 80 kg   SpO2 95%   BMI 26.82 kg/m?  ?Physical Exam ?General: No acute distress ?Cardiac: Well-perfused ?Lungs: Nonlabored ?Psych: Redirectable ? ?ED Course / MDM  ?EKG:  ? ?I have reviewed the labs performed to date as well as medications administered while in observation.  Recent changes in the last 24 hours include TOC working for placement. ? ?Plan  ?Current plan is for discharge to [facility]. ? [facility] is not under involuntary commitment. ? ? ?  ?Hayden Rasmussen, MD ?10/23/21 1731 ? ?

## 2021-10-23 NOTE — ED Provider Notes (Signed)
?[facility] ?Provider Note ? ? ?CSN: [id number] ?Arrival date & time: 10/23/21  2232 ? ?  ? ?History ? ?Chief Complaint  ?Patient presents with  ? Fall  ?  No thinners  ? ? ?Joshua Hess is a 81 y.o. male. ? ?The history is provided by the spouse and medical records.  ?Fall ?Joshua Hess is a 81 y.o. male who presents to the Emergency Department complaining of fall.  Level 5 caveat due to dementia.  He presents to the emergency department by EMS from [facility] facility for evaluation following a fall.  He has recently been diagnosed with Lewy body dementia and was just placed in [facility] today due to worsening dementia.  While he was at the facility he rolled out of bed and struck his head.  He takes 81 mg aspirin daily.  Spouse is at the bedside and states his current behavior is typical for him over the last several weeks.  Patient denies any pain. ? ?ROS positive for poor oral intake, agitation.  ?  ? ?Home Medications ?Prior to Admission medications   ?Medication Sig Start Date End Date Taking? Authorizing Provider  ?ALPRAZolam (XANAX) 0.5 MG tablet Take 0.5-1 tablets by mouth daily as needed for anxiety. 10/23/20  Yes [provider]  ?amoxicillin (AMOXIL) 500 MG capsule TAKE 4 CAPSULES BY MOUTH 1-2 HOURS PRIOR TO DENTAL APPOINTMENT ?Patient taking differently: Take 2,000 mg by mouth See admin instructions. 1-2 HOURS PRIOR TO DENTAL APPOINTMENT 07/18/21  Yes Burnell Blanks, MD  ?Apoaequorin 10 MG CAPS Take 10 mg by mouth daily.   Yes [provider]  ?aspirin EC 81 MG EC tablet Take 1 tablet (81 mg total) by mouth daily. Swallow whole. 03/10/20  Yes Raiford Noble Dexter, DO  ?DULoxetine (CYMBALTA) 30 MG capsule Take 60 mg by mouth in the morning. 10/01/21  Yes [provider]  ?fluorouracil (EFUDEX) 5 % cream Apply 1 application. topically 2 (two) times daily as needed (skin protection). 10/08/21  Yes [provider]  ?lidocaine  (XYLOCAINE) 5 % ointment Apply 1 application. topically in the morning and at bedtime.   Yes [provider]  ?magnesium hydroxide (MILK OF MAGNESIA) 400 MG/5ML suspension Take 30 mLs by mouth daily as needed for mild constipation.   Yes [provider]  ?OLANZapine (ZYPREXA) 5 MG tablet Take 5 mg by mouth at bedtime. 10/01/21  Yes [provider]  ?polyethylene glycol (MIRALAX / GLYCOLAX) 17 g packet Take 17 g by mouth daily. 11/22/19  Yes Kayleen Memos, DO  ?rosuvastatin (CRESTOR) 10 MG tablet Take 10 mg by mouth at bedtime. 10/14/21  Yes [provider]  ?tadalafil (CIALIS) 20 MG tablet Take 20 mg by mouth daily as needed for erectile dysfunction.   Yes [provider]  ?traZODone (DESYREL) 50 MG tablet Take 3 tablets by mouth at bedtime. 05/28/21  Yes [provider]  ?oxyCODONE (OXY IR/ROXICODONE) 5 MG immediate release tablet Take 1 tablet (5 mg total) by mouth every 6 (six) hours as needed for severe pain. ?Patient not taking: Reported on 08/20/2021 08/09/21   Johnathan Hausen, MD  ?ZYPITAMAG 4 MG TABS TAKE 1 TABLET BY MOUTH DAILY. ?Patient not taking: Reported on 10/21/2021 04/14/21   Gregor Hams, MD  ?   ? ?Allergies    ?Flomax [tamsulosin]   ? ?Review of Systems   ?Review of Systems  ?All other systems reviewed and are negative. ? ?Physical Exam ?Updated Vital Signs ?  BP (!) 144/119 (BP Location: Left Arm)   Pulse 60   Temp 98.1 ?F (36.7 ?C) (Oral)   Resp 18   SpO2 95%  ?Physical Exam ?Vitals and nursing note reviewed.  ?Constitutional:   ?   Appearance: He is well-developed.  ?HENT:  ?   Head: Normocephalic.  ?   Comments: Superficial laceration over the central forehead ?Cardiovascular:  ?   Rate and Rhythm: Normal rate and regular rhythm.  ?   Heart sounds: No murmur heard. ?Pulmonary:  ?   Effort: Pulmonary effort is normal. No respiratory distress.  ?   Breath sounds: Normal breath sounds.  ?Abdominal:  ?   Palpations: Abdomen is soft.  ?   Tenderness:  There is no abdominal tenderness. There is no guarding or rebound.  ?Musculoskeletal:     ?   General: No tenderness.  ?   Comments: There is a superficial abrasion over the right knee without any focal tenderness in this area.  No hip tenderness to palpation.  He spontaneously ranges bilateral lower extremities at the hips, knees, ankles.  ?Skin: ?   General: Skin is warm and dry.  ?Neurological:  ?   Mental Status: He is alert.  ?   Comments: Confused.  Mildly agitated.  Frequently reaching and grabbing at items.  He is moving all extremities symmetrically.  ? ? ?ED Results / Procedures / Treatments   ?Labs ?(all labs ordered are listed, but only abnormal results are displayed) ?Labs Reviewed  ?BASIC METABOLIC PANEL - Abnormal; Notable for the following components:  ?    Result Value  ? Glucose, Bld 115 (*)   ? BUN 31 (*)   ? Creatinine, Ser 1.72 (*)   ? GFR, Estimated 39 (*)   ? All other components within normal limits  ?CBC  ? ? ?EKG ?None ? ?Radiology ?CT Head Wo Contrast ? ?Result Date: 10/24/2021 ?CLINICAL DATA:  Head trauma. EXAM: CT HEAD WITHOUT CONTRAST TECHNIQUE: Contiguous axial images were obtained from the base of the skull through the vertex without intravenous contrast. RADIATION DOSE REDUCTION: This exam was performed according to the departmental dose-optimization program which includes automated exposure control, adjustment of the mA and/or kV according to patient size and/or use of iterative reconstruction technique. COMPARISON:  CT head 10/21/2021. FINDINGS: Brain: No evidence of acute infarction, hemorrhage, hydrocephalus, extra-axial collection or mass lesion/mass effect. Again seen is mild diffuse atrophy and mild periventricular white matter hypodensity, likely chronic small vessel ischemic change. Vascular: Atherosclerotic calcifications are present within the cavernous internal carotid arteries. Skull: Normal. Negative for fracture or focal lesion. Sinuses/Orbits: No acute finding. Other:  None. IMPRESSION: No acute intracranial abnormality. Electronically Signed   By: Ronney Asters M.D.   On: 10/24/2021 00:00  ? ?CT Cervical Spine Wo Contrast ? ?Result Date: 10/23/2021 ?CLINICAL DATA:  Trauma. EXAM: CT CERVICAL SPINE WITHOUT CONTRAST TECHNIQUE: Multidetector CT imaging of the cervical spine was performed without intravenous contrast. Multiplanar CT image reconstructions were also generated. RADIATION DOSE REDUCTION: This exam was performed according to the departmental dose-optimization program which includes automated exposure control, adjustment of the mA and/or kV according to patient size and/or use of iterative reconstruction technique. COMPARISON:  None. FINDINGS: Alignment: There is 2 mm of retrolisthesis at C4-C5 which is favored as degenerative. Alignment is otherwise anatomic. Skull base and vertebrae: No acute fracture. No primary bone lesion or focal pathologic process. Soft tissues and spinal canal: No prevertebral fluid or swelling. No visible canal hematoma. Disc levels:  There is moderate disc space narrowing from C3 through C7 compatible with degenerative change. No severe central canal or neural foraminal stenosis identified. Upper chest: Negative. Other: None. IMPRESSION: 1. No acute fracture or traumatic subluxation of the cervical spine. 2. 2 mm of retrolisthesis at C4-C5 is favored as degenerative. 3. Moderate multilevel degenerative changes. Electronically Signed   By: Ronney Asters M.D.   On: 10/23/2021 23:58  ? ?DG Chest Portable 1 View ? ?Result Date: 10/22/2021 ?CLINICAL DATA:  Screening. EXAM: PORTABLE CHEST 1 VIEW COMPARISON:  10/21/2021 FINDINGS: 1557 hours. Low volume film. Cardiopericardial silhouette is at upper limits of normal for size. The lungs are clear without focal pneumonia, edema, pneumothorax or pleural effusion. The visualized bony structures of the thorax are unremarkable. IMPRESSION: No active disease. Electronically Signed   By: Misty Stanley M.D.   On:  10/22/2021 16:27   ? ?Procedures ?Marland Kitchen.Laceration Repair ? ?Date/Time: 10/24/2021 3:00 AM ?Performed by: Quintella Reichert, MD ?Authorized by: Quintella Reichert, MD  ? ?Consent:  ?  Consent obtained:  Verbal ?  Consent

## 2021-10-23 NOTE — ED Notes (Signed)
Patient transported to CT 

## 2021-10-23 NOTE — ED Notes (Signed)
Report given to [facility] ?

## 2021-10-24 DIAGNOSIS — G3183 Dementia with Lewy bodies: Secondary | ICD-10-CM

## 2021-10-24 DIAGNOSIS — Z66 Do not resuscitate: Secondary | ICD-10-CM | POA: Diagnosis present

## 2021-10-24 DIAGNOSIS — Z515 Encounter for palliative care: Secondary | ICD-10-CM | POA: Diagnosis not present

## 2021-10-24 DIAGNOSIS — R627 Adult failure to thrive: Secondary | ICD-10-CM | POA: Diagnosis present

## 2021-10-24 DIAGNOSIS — F02C11 Dementia in other diseases classified elsewhere, severe, with agitation: Secondary | ICD-10-CM | POA: Diagnosis not present

## 2021-10-24 DIAGNOSIS — Z85820 Personal history of malignant melanoma of skin: Secondary | ICD-10-CM | POA: Diagnosis not present

## 2021-10-24 DIAGNOSIS — N179 Acute kidney failure, unspecified: Secondary | ICD-10-CM | POA: Diagnosis not present

## 2021-10-24 DIAGNOSIS — Z952 Presence of prosthetic heart valve: Secondary | ICD-10-CM | POA: Diagnosis not present

## 2021-10-24 DIAGNOSIS — S80211A Abrasion, right knee, initial encounter: Secondary | ICD-10-CM | POA: Diagnosis present

## 2021-10-24 DIAGNOSIS — W19XXXA Unspecified fall, initial encounter: Secondary | ICD-10-CM | POA: Diagnosis present

## 2021-10-24 DIAGNOSIS — G250 Essential tremor: Secondary | ICD-10-CM | POA: Diagnosis present

## 2021-10-24 DIAGNOSIS — F39 Unspecified mood [affective] disorder: Secondary | ICD-10-CM

## 2021-10-24 DIAGNOSIS — Z7189 Other specified counseling: Secondary | ICD-10-CM | POA: Diagnosis not present

## 2021-10-24 DIAGNOSIS — I2510 Atherosclerotic heart disease of native coronary artery without angina pectoris: Secondary | ICD-10-CM | POA: Diagnosis present

## 2021-10-24 DIAGNOSIS — S50311A Abrasion of right elbow, initial encounter: Secondary | ICD-10-CM | POA: Diagnosis present

## 2021-10-24 DIAGNOSIS — G9341 Metabolic encephalopathy: Secondary | ICD-10-CM | POA: Diagnosis present

## 2021-10-24 DIAGNOSIS — Z8249 Family history of ischemic heart disease and other diseases of the circulatory system: Secondary | ICD-10-CM | POA: Diagnosis not present

## 2021-10-24 DIAGNOSIS — Z751 Person awaiting admission to adequate facility elsewhere: Secondary | ICD-10-CM | POA: Diagnosis not present

## 2021-10-24 DIAGNOSIS — S80212A Abrasion, left knee, initial encounter: Secondary | ICD-10-CM | POA: Diagnosis present

## 2021-10-24 DIAGNOSIS — S0181XA Laceration without foreign body of other part of head, initial encounter: Secondary | ICD-10-CM | POA: Diagnosis present

## 2021-10-24 DIAGNOSIS — F32A Depression, unspecified: Secondary | ICD-10-CM | POA: Diagnosis present

## 2021-10-24 DIAGNOSIS — F419 Anxiety disorder, unspecified: Secondary | ICD-10-CM | POA: Diagnosis present

## 2021-10-24 DIAGNOSIS — Z7982 Long term (current) use of aspirin: Secondary | ICD-10-CM | POA: Diagnosis not present

## 2021-10-24 DIAGNOSIS — W06XXXA Fall from bed, initial encounter: Secondary | ICD-10-CM | POA: Diagnosis present

## 2021-10-24 DIAGNOSIS — R296 Repeated falls: Secondary | ICD-10-CM | POA: Diagnosis present

## 2021-10-24 DIAGNOSIS — Z6826 Body mass index (BMI) 26.0-26.9, adult: Secondary | ICD-10-CM | POA: Diagnosis not present

## 2021-10-24 DIAGNOSIS — R64 Cachexia: Secondary | ICD-10-CM | POA: Diagnosis present

## 2021-10-24 DIAGNOSIS — F0283 Dementia in other diseases classified elsewhere, unspecified severity, with mood disturbance: Secondary | ICD-10-CM | POA: Diagnosis present

## 2021-10-24 DIAGNOSIS — E860 Dehydration: Secondary | ICD-10-CM | POA: Diagnosis present

## 2021-10-24 LAB — BASIC METABOLIC PANEL
Anion gap: 9 (ref 5–15)
BUN: 30 mg/dL — ABNORMAL HIGH (ref 8–23)
CO2: 21 mmol/L — ABNORMAL LOW (ref 22–32)
Calcium: 8.7 mg/dL — ABNORMAL LOW (ref 8.9–10.3)
Chloride: 108 mmol/L (ref 98–111)
Creatinine, Ser: 1.23 mg/dL (ref 0.61–1.24)
GFR, Estimated: 59 mL/min — ABNORMAL LOW (ref >60)
Glucose, Bld: 93 mg/dL (ref 70–99)
Potassium: 3.8 mmol/L (ref 3.5–5.1)
Sodium: 138 mmol/L (ref 135–145)

## 2021-10-24 MED ORDER — HALOPERIDOL LACTATE 5 MG/ML IJ SOLN
5.0000 mg | Freq: Four times a day (QID) | INTRAMUSCULAR | Status: DC | PRN
  Administered 2021-10-24: 5 mg via INTRAVENOUS
  Filled 2021-10-24: qty 1

## 2021-10-24 MED ORDER — ACETAMINOPHEN 325 MG PO TABS: 650.0000 mg | ORAL_TABLET | Freq: Four times a day (QID) | ORAL | Status: DC | PRN

## 2021-10-24 MED ORDER — HALOPERIDOL 1 MG PO TABS
2.0000 mg | ORAL_TABLET | ORAL | Status: DC | PRN
  Filled 2021-10-24: qty 2

## 2021-10-24 MED ORDER — LORAZEPAM 1 MG PO TABS: 1.0000 mg | ORAL_TABLET | ORAL | Status: DC | PRN

## 2021-10-24 MED ORDER — SODIUM CHLORIDE 0.9 % IV BOLUS
500.0000 mL | Freq: Once | INTRAVENOUS | Status: AC
  Administered 2021-10-24: 500 mL via INTRAVENOUS

## 2021-10-24 MED ORDER — BIOTENE DRY MOUTH MT LIQD
15.0000 mL | Freq: Three times a day (TID) | OROMUCOSAL | Status: DC
  Administered 2021-10-26 – 2021-10-27 (×3): 15 mL via TOPICAL

## 2021-10-24 MED ORDER — LIDOCAINE-EPINEPHRINE-TETRACAINE (LET) TOPICAL GEL
3.0000 mL | Freq: Once | TOPICAL | Status: AC
  Administered 2021-10-24: 3 mL via TOPICAL
  Filled 2021-10-24: qty 3

## 2021-10-24 MED ORDER — SODIUM CHLORIDE 0.9 % IV SOLN: INTRAVENOUS | Status: DC

## 2021-10-24 MED ORDER — GLYCOPYRROLATE 0.2 MG/ML IJ SOLN: 0.2000 mg | INTRAMUSCULAR | Status: DC | PRN

## 2021-10-24 MED ORDER — MORPHINE SULFATE (PF) 2 MG/ML IV SOLN: 1.0000 mg | INTRAVENOUS | Status: DC | PRN

## 2021-10-24 MED ORDER — POLYVINYL ALCOHOL 1.4 % OP SOLN
1.0000 [drp] | Freq: Four times a day (QID) | OPHTHALMIC | Status: DC | PRN
  Filled 2021-10-24: qty 15

## 2021-10-24 MED ORDER — ACETAMINOPHEN 650 MG RE SUPP
650.0000 mg | Freq: Four times a day (QID) | RECTAL | Status: DC | PRN
Start: 2021-10-24 — End: 2021-10-28

## 2021-10-24 MED ORDER — MORPHINE SULFATE (PF) 2 MG/ML IV SOLN
2.0000 mg | INTRAVENOUS | Status: DC | PRN
  Administered 2021-10-24: 2 mg via INTRAVENOUS
  Filled 2021-10-24: qty 1

## 2021-10-24 MED ORDER — LORAZEPAM 2 MG/ML IJ SOLN
1.0000 mg | INTRAMUSCULAR | Status: DC | PRN
  Administered 2021-10-24: 1 mg via INTRAVENOUS
  Filled 2021-10-24: qty 1

## 2021-10-24 MED ORDER — OLANZAPINE 5 MG PO TABS
5.0000 mg | ORAL_TABLET | Freq: Every day | ORAL | Status: DC
  Administered 2021-10-24: 5 mg via ORAL
  Filled 2021-10-24 (×3): qty 1

## 2021-10-24 MED ORDER — HALOPERIDOL LACTATE 2 MG/ML PO CONC
2.0000 mg | ORAL | Status: DC | PRN
  Filled 2021-10-24: qty 1

## 2021-10-24 MED ORDER — GLYCOPYRROLATE 1 MG PO TABS: 1.0000 mg | ORAL_TABLET | Freq: Three times a day (TID) | ORAL | 0 refills | Status: AC

## 2021-10-24 MED ORDER — GLYCOPYRROLATE 1 MG PO TABS
1.0000 mg | ORAL_TABLET | ORAL | Status: DC | PRN
  Filled 2021-10-24: qty 1

## 2021-10-24 MED ORDER — HALOPERIDOL LACTATE 5 MG/ML IJ SOLN: 2.0000 mg | INTRAMUSCULAR | Status: DC | PRN

## 2021-10-24 MED ORDER — TRAZODONE HCL 150 MG PO TABS
150.0000 mg | ORAL_TABLET | Freq: Every day | ORAL | Status: DC
  Administered 2021-10-24: 150 mg via ORAL
  Filled 2021-10-24: qty 3
  Filled 2021-10-24: qty 1

## 2021-10-24 MED ORDER — LORAZEPAM 2 MG/ML PO CONC: 1.0000 mg | ORAL | Status: DC | PRN

## 2021-10-24 NOTE — Care Management (Signed)
ED RNCM spoke with Palliative NP recommendation for residential hospice for comfort care.  [facility] was requested by the family.  As per Palliative NP  referral was made but no available beds until tomorrow.  Dr. Erling Conte  was notified, and   plan confirmed  patient will be admitted for comfort care, and discharged to [facility] once bed is available tomorrow updated ED nursing staff..TOC team will follow up with transitional care planning  to [facility] tomorrow. ?

## 2021-10-24 NOTE — H&P (Signed)
?History and Physical  ? ? ?Joshua Hess HYQ:[id number] DOB: 18-Oct-1940 DOA: 10/23/2021 ? ?PCP: [name], MD ? ?Patient coming from: SNF ? ?Chief Complaint: Fall ? ?HPI: Joshua Hess is a 81 y.o. male with medical history significant of Lewy body dementia, CAD, carotid artery disease status post endarterectomy, severe aortic stenosis status post AVR, CVA, essential tremor, depression, anxiety.  Admitted in January 2023 for rib fractures secondary to mechanical fall.  Recently diagnosed with Lewy body dementia and was just placed in [place] today due to worsening dementia.  While he was at the facility, he rolled out of bed and struck his head.  He is on aspirin 81 mg daily.  Family reported very poor oral intake over the last several days and overall rapid decline over the last few weeks.   ? ?Sustained a superficial laceration to his forehead which was repaired with Dermabond.  Vital signs stable.  CT head and C-spine negative for acute abnormality.  Labs significant for AKI -BUN 31, creatinine 1.7 (baseline 1.0).  UA pending.  Patient was given Ativan and 500 cc normal saline bolus. ? ?Patient is not able to give any history.  His husband at bedside is tearful and states patient was recently diagnosed with Lewy body dementia and rapidly declining over the last few weeks.  He has stopped eating.  He was just placed to SNF yesterday where he rolled out of bed and fell.  ? ?Review of Systems:  ?Review of Systems  ?All other systems reviewed and are negative. ? ?Past Medical History:  ?Diagnosis Date  ? Aphasia   ? Cancer [facility])   ? behind ear- [place]  ? Carotid artery stenosis   ? Chronotropic incompetence   ? ED (erectile dysfunction)   ? Elevated PSA   ? HOH (hard of hearing)   ? Mild CAD   ? Plantar fasciitis   ? Postoperative atrial fibrillation (HCC)   ? Severe aortic stenosis   ? a. s/p tissue AVR 10/2018.  ? Sinus bradycardia   ? baseline  ? Stroke [facility]) 03/05/2020  ? TIA  ? Wears glasses   ? ? ?Past  Surgical History:  ?Procedure Laterality Date  ? AORTIC VALVE REPLACEMENT N/A 11/10/2018  ? Procedure: AORTIC VALVE REPLACEMENT (AVR), USING 23MM INSPIRIS;  Surgeon: Gaye Pollack, MD;  Location: [place];  Service: Open Heart Surgery;  Laterality: N/A;  ? ENDARTERECTOMY Left 03/08/2020  ? Procedure: ENDARTERECTOMY CAROTID;  Surgeon: Rosetta Posner, MD;  Location: [place];  Service: Vascular;  Laterality: Left;  ? EXTERNAL EAR SURGERY    ? melanoma removed  ? HERNIA REPAIR Right   ? inguinal  ? INGUINAL HERNIA REPAIR Right 11/15/2019  ? Procedure: LAPAROSCOPIC RIGHT INGUINAL HERNIA REPAIR WITH MESH;  Surgeon: Coralie Keens, MD;  Location: [place];  Service: General;  Laterality: Right;  ? RIGHT/LEFT HEART CATH AND CORONARY ANGIOGRAPHY N/A 09/29/2018  ? Procedure: RIGHT/LEFT HEART CATH AND CORONARY ANGIOGRAPHY;  Surgeon: Burnell Blanks, MD;  Location: [place] CV LAB;  Service: Cardiovascular;  Laterality: N/A;  ? TEE WITHOUT CARDIOVERSION N/A 11/10/2018  ? Procedure: TRANSESOPHAGEAL ECHOCARDIOGRAM (TEE);  Surgeon: Gaye Pollack, MD;  Location: [place];  Service: Open Heart Surgery;  Laterality: N/A;  ? TRANSCATHETER AORTIC VALVE REPLACEMENT, TRANSFEMORAL    ? PROCEDURE ATTEMPTED & ABORTED   ? UMBILICAL HERNIA REPAIR N/A 11/15/2019  ? Procedure: UMBILICAL HERNIA REPAIR;  Surgeon: Coralie Keens, MD;  Location: [place];  Service: General;  Laterality:  N/A;  ? ? ? reports that he quit smoking about 53 years ago. His smoking use included cigarettes. He has never used smokeless tobacco. He reports that he does not drink alcohol and does not use drugs. ? ?Allergies  ?Allergen Reactions  ? Flomax [Tamsulosin] Other (See Comments)  ?  Made feel foggy headed  ? ? ?Family History  ?Problem Relation Age of Onset  ? Heart attack Mother   ? Other Father   ?     unsure of history  ? Heart disease Brother   ? Dementia Sister   ? ? ?Prior to Admission medications   ?Medication Sig Start Date End Date Taking? Authorizing Provider   ?ALPRAZolam (XANAX) 0.5 MG tablet Take 0.5-1 tablets by mouth daily as needed for anxiety. 10/23/20  Yes [provider]  ?amoxicillin (AMOXIL) 500 MG capsule TAKE 4 CAPSULES BY MOUTH 1-2 HOURS PRIOR TO DENTAL APPOINTMENT ?Patient taking differently: Take 2,000 mg by mouth See admin instructions. 1-2 HOURS PRIOR TO DENTAL APPOINTMENT 07/18/21  Yes Burnell Blanks, MD  ?Apoaequorin 10 MG CAPS Take 10 mg by mouth daily.   Yes [provider]  ?aspirin EC 81 MG EC tablet Take 1 tablet (81 mg total) by mouth daily. Swallow whole. 03/10/20  Yes Raiford Noble [place], DO  ?DULoxetine (CYMBALTA) 30 MG capsule Take 60 mg by mouth in the morning. 10/01/21  Yes [provider]  ?fluorouracil (EFUDEX) 5 % cream Apply 1 application. topically 2 (two) times daily as needed (skin protection). 10/08/21  Yes [provider]  ?lidocaine (XYLOCAINE) 5 % ointment Apply 1 application. topically in the morning and at bedtime.   Yes [provider]  ?magnesium hydroxide (MILK OF MAGNESIA) 400 MG/5ML suspension Take 30 mLs by mouth daily as needed for mild constipation.   Yes [provider]  ?OLANZapine (ZYPREXA) 5 MG tablet Take 5 mg by mouth at bedtime. 10/01/21  Yes [provider]  ?polyethylene glycol (MIRALAX / GLYCOLAX) 17 g packet Take 17 g by mouth daily. 11/22/19  Yes Kayleen Memos, DO  ?rosuvastatin (CRESTOR) 10 MG tablet Take 10 mg by mouth at bedtime. 10/14/21  Yes [provider]  ?tadalafil (CIALIS) 20 MG tablet Take 20 mg by mouth daily as needed for erectile dysfunction.   Yes [provider]  ?traZODone (DESYREL) 50 MG tablet Take 3 tablets by mouth at bedtime. 05/28/21  Yes [provider]  ?oxyCODONE (OXY IR/ROXICODONE) 5 MG immediate release tablet Take 1 tablet (5 mg total) by mouth every 6 (six) hours as needed for severe pain. ?Patient not taking: Reported on 08/20/2021 08/09/21   Johnathan Hausen, MD  ?ZYPITAMAG 4 MG TABS TAKE 1  TABLET BY MOUTH DAILY. ?Patient not taking: Reported on 10/21/2021 04/14/21   Gregor Hams, MD  ? ? ?Physical Exam: ?Vitals:  ? 10/23/21 2240 10/23/21 2245 10/24/21 0036 10/24/21 0313  ?BP:  132/76 (!) 144/119 (!) 164/115  ?Pulse:  79 60 70  ?Resp:   18 18  ?Temp:  98.5 ?F (36.9 ?C) 98.1 ?F (36.7 ?C)   ?TempSrc:  Oral Oral   ?SpO2: 100% 97% 95% 100%  ? ? ?Physical Exam ?Vitals reviewed.  ?Constitutional:   ?   General: He is not in acute distress. ?HENT:  ?   Head: Normocephalic and atraumatic.  ?Cardiovascular:  ?   Rate and Rhythm: Normal rate and regular rhythm.  ?   Pulses: Normal pulses.  ?Pulmonary:  ?   Effort: Pulmonary effort  is normal. No respiratory distress.  ?   Breath sounds: Normal breath sounds. No wheezing or rales.  ?Abdominal:  ?   General: Bowel sounds are normal. There is no distension.  ?   Palpations: Abdomen is soft.  ?   Tenderness: There is no abdominal tenderness.  ?Musculoskeletal:     ?   General: No swelling or tenderness.  ?   Cervical back: Normal range of motion.  ?Skin: ?   General: Skin is warm and dry.  ?Neurological:  ?   Mental Status: He is alert.  ?   Comments: Oriented to self only  ?  ? ?Labs on Admission: I have personally reviewed following labs and imaging studies ? ?CBC: ?Recent Labs  ?Lab 10/21/21 ?1202 10/23/21 ?2308  ?WBC 6.4 9.8  ?NEUTROABS 4.4  --   ?HGB 14.6 17.0  ?HCT 42.3 48.2  ?MCV 95.9 95.6  ?PLT 166 212  ? ?Basic Metabolic Panel: ?Recent Labs  ?Lab 10/21/21 ?1202 10/23/21 ?2308  ?NA 140 137  ?K 4.0 4.6  ?CL 107 103  ?CO2 28 23  ?GLUCOSE 95 115*  ?BUN 20 31*  ?CREATININE 0.91 1.72*  ?CALCIUM 8.9 9.6  ? ?GFR: ?Estimated Creatinine Clearance: 32.6 mL/min (A) (by C-G formula based on SCr of 1.72 mg/dL (H)). ?Liver Function Tests: ?Recent Labs  ?Lab 10/21/21 ?1202  ?AST 28  ?ALT 30  ?ALKPHOS 51  ?BILITOT 1.1  ?PROT 6.5  ?ALBUMIN 3.6  ? ?No results for input(s): LIPASE, AMYLASE in the last 168 hours. ?No results for input(s): AMMONIA in the last 168  hours. ?Coagulation Profile: ?No results for input(s): INR, PROTIME in the last 168 hours. ?Cardiac Enzymes: ?No results for input(s): CKTOTAL, CKMB, CKMBINDEX, TROPONINI in the last 168 hours. ?BNP (last 3 results) ?No

## 2021-10-24 NOTE — ED Notes (Signed)
Pt agitated and attempting to get out of bed 

## 2021-10-24 NOTE — Assessment & Plan Note (Signed)
-   Husband present at bedside, all current finding, will comorbidities, progressive decline with a new diagnosis of Lewy body dementia.... Due to poor p.o. intake, significant failure to thrive, and comorbidities patient has poor prognosis ?-Husband expressed understanding follow-up with palliative care, if no improvement likely a candidate for hospice ?-He confirmed DNR/DNI status ?

## 2021-10-24 NOTE — Assessment & Plan Note (Addendum)
-   Home medication Zyprexa and trazodone --discontinued due to altered mental status, unable to tolerate p.o., may aspirate ?

## 2021-10-24 NOTE — Progress Notes (Signed)
[facility]ufacturing [facility] Hospital Liaison Note ? ?Received request from PMT provider/Joshua Hess for family interest in [facility]. Visited patient at bedside and spoke with spouse/Joshua Hess to confirm interest and explain services. ? ?Approval for [employer] is determined by [facility] MD. Once [facility] MD has determined [facility] eligibility, [place] will update hospital staff and family ? ?Please do not hesitate to call with any hospice related questions.  ?  ?Thank you for the opportunity to participate in this patient's care. ? ?Joshua Hess, MSW ?[place] Hospital Liaison  ?[phone] ? ?

## 2021-10-24 NOTE — Assessment & Plan Note (Addendum)
Lewy body dementia ?-Recently diagnosed Lewy body dementia presenting with rapidly progressive decline/failure to thrive.   ?Just placed to SNF yesterday 4/13 and brought into the ED soon after as he rolled out of bed and fell. ?-Palliative care consulted ?-Based on patient's current condition, severe failure to thrive, poor p.o. intake, cachexia, worsening dementia--prognosis is poor, patient may be hospice appropriate ?-We will avoid any oral intake since patient is somnolent, ? ?

## 2021-10-24 NOTE — Discharge Summary (Addendum)
?Physician Discharge Summary ?  ?Patient: Joshua Hess MRN: [id number] DOB: 1941/01/01  ?Admit date:     10/23/2021  ?Discharge date: 10/24/21  ?Discharge Physician: Valeria Batman Donelda Mailhot  ? ?PCP: [name], MD  ? ?Recommendations at discharge:  ? ?Follow-up with hospice -  ? ?Discharge Diagnoses: ?Principal Problem: ?  Failure to thrive in adult ?Active Problems: ?  AKI (acute kidney injury) ([place]) ?  Goals of care, counseling/discussion ?  Mood disorder ([place]) ?  Metabolic encephalopathy ? ?Resolved Problems: ?  * No resolved hospital problems. * ? ?Hospital Course: ? ? ?Joshua Hess is a 81 y.o. male with medical history significant of Lewy body dementia, CAD, carotid artery disease status post endarterectomy, severe aortic stenosis status post AVR, CVA, essential tremor, depression, anxiety.  Admitted in January 2023 for rib fractures secondary to mechanical fall.  Recently diagnosed with Lewy body dementia and was just placed in [place] today due to worsening dementia.  While he was at the facility, he rolled out of bed and struck his head.  He is on aspirin 81 mg daily.   ?Family reported very poor oral intake over the last several days and overall rapid decline over the last few weeks.   ?  ?Sustained a superficial laceration to his forehead which was repaired with Dermabond.  Vital signs stable.  CT head and C-spine negative for acute abnormality.  Labs significant for AKI -BUN 31, creatinine 1.7 (baseline 1.0).  UA pending.  Patient was given Ativan and 500 cc normal saline bolus. ?  ?Patient is not able to give any history.  His husband at bedside is tearful and states patient was recently diagnosed with Lewy body dementia and rapidly declining over the last few weeks.  He has stopped eating.  He was just placed to SNF yesterday where he rolled out of bed and fell. ? ?Assessment and Plan: ?* Failure to thrive in adult ?Lewy body dementia ?-Recently diagnosed Lewy body dementia presenting with rapidly  progressive decline/failure to thrive.   ?Just placed to SNF yesterday 4/13 and brought into the ED soon after as he rolled out of bed and fell. ?-Palliative care consulted >> husband agreed to proceed with hospice ?-Based on patient's current condition, severe failure to thrive, poor p.o. intake, cachexia, worsening dementia--prognosis is poor, patient may be hospice appropriate ? ?-We will avoid any oral intake since patient is somnolent, ? ? ?Unable to tolerate any p.o. intake --hold p.o. meds are on hold ?We discontinued ? ? ? ?goals of care, counseling/discussion ?- Husband present at bedside, all current finding, will comorbidities, progressive decline with a new diagnosis of Lewy body dementia.... Due to poor p.o. intake, significant failure to thrive, and comorbidities patient has poor prognosis ?-Husband expressed understanding follow-up with palliative care, if no improvement likely a candidate for hospice ?-He confirmed DNR/DNI status ? ?AKI (acute kidney injury) ([place]) ?Likely prerenal from dehydration given very poor oral intake.  ? BUN 31, creatinine 1.7 (baseline 1.0). ?-IV fluid hydration ?-Monitor renal function and avoid nephrotoxic agents ? ? ?Mood disorder ([place]) ?-Holding Zyprexa and trazodone ? ? ?Consultants: Palliative care hospice ? ?Disposition: Hospice care ?Diet recommendation:  ?Discharge Diet Orders (From admission, onward)  ? ?  Start     Ordered  ? 10/24/21 0000  Diet - low sodium heart healthy       ? 10/24/21 1457  ? ?  ?  ? ?  ? ?Regular diet ?DISCHARGE MEDICATION: ?Allergies as of 10/24/2021   ? ?  Reactions  ? Flomax [tamsulosin] Other (See Comments)  ? Made feel foggy headed  ? ?  ? ?  ?Medication List  ?  ? ?STOP taking these medications   ? ?ALPRAZolam 0.5 MG tablet ?Commonly known as: Duanne Moron ?  ?amoxicillin 500 MG capsule ?Commonly known as: AMOXIL ?  ?DULoxetine 30 MG capsule ?Commonly known as: CYMBALTA ?  ?OLANZapine 5 MG tablet ?Commonly known as: ZYPREXA ?  ?oxyCODONE 5 MG  immediate release tablet ?Commonly known as: Oxy IR/ROXICODONE ?  ?rosuvastatin 10 MG tablet ?Commonly known as: CRESTOR ?  ?tadalafil 20 MG tablet ?Commonly known as: CIALIS ?  ?Zypitamag 4 MG Tabs ?Generic drug: Pitavastatin Magnesium ?  ? ?  ? ?TAKE these medications   ? ?Apoaequorin 10 MG Caps ?Take 10 mg by mouth daily. ?  ?aspirin 81 MG EC tablet ?Take 1 tablet (81 mg total) by mouth daily. Swallow whole. ?  ?fluorouracil 5 % cream ?Commonly known as: EFUDEX ?Apply 1 application. topically 2 (two) times daily as needed (skin protection). ?  ?glycopyrrolate 1 MG tablet ?Commonly known as: Robinul ?Take 1 tablet (1 mg total) by mouth 3 (three) times daily for 5 days. ?  ?lidocaine 5 % ointment ?Commonly known as: XYLOCAINE ?Apply 1 application. topically in the morning and at bedtime. ?  ?magnesium hydroxide 400 MG/5ML suspension ?Commonly known as: MILK OF MAGNESIA ?Take 30 mLs by mouth daily as needed for mild constipation. ?  ?polyethylene glycol 17 g packet ?Commonly known as: MIRALAX / GLYCOLAX ?Take 17 g by mouth daily. ?  ?traZODone 50 MG tablet ?Commonly known as: DESYREL ?Take 3 tablets by mouth at bedtime. ?  ? ?  ? ?  ?  ? ? ?  ?Discharge Care Instructions  ?(From admission, onward)  ?  ? ? ?  ? ?  Start     Ordered  ? 10/24/21 0000  Discharge wound care:       ?Comments: Per nursing  ? 10/24/21 1457  ? ?  ?  ? ?  ? ? ?Discharge Exam: ?There were no vitals filed for this visit. ? ? ? ?Physical Exam: ?  ?General:  Unresponsive, not reacting to pain stimuli, severely cachectic, ? chronically ill male  ?HEENT:  Normocephalic, PERRL, otherwise with in Normal limits   ?Neuro:  Limited exam-encephalopathic  ?Does not withdrawal with pain stimuli ?   ?Lungs:   auscultation BL, Respirations unlabored,  ?No wheezes / crackles  ?Cardio:    S1/S2, RRR, No murmure, No Rubs or Gallops   ?Abdomen:  Soft, non-tender, bowel sounds active all four quadrants, ?no guarding or peritoneal signs.  ?Muscular  ?skeletal:   Limited exam -generalized severe cachexia, ?Not responding to pain stimuli ?- in bed, contraction ?2+ pulses,  symmetric, No pitting edema  ?Skin:  Dry, warm to touch, negative for any Rashes,  ?Wounds: Please see nursing documentation ?   ? ? ?  ? ? ?Condition at discharge: worsening ? ?The results of significant diagnostics from this hospitalization (including imaging, microbiology, ancillary and laboratory) are listed below for reference.  ? ?Imaging Studies: ?DG Pelvis 1-2 Views ? ?Result Date: 10/21/2021 ?CLINICAL DATA:  Fall EXAM: PELVIS - 1-2 VIEW COMPARISON:  08/08/2021 FINDINGS: Degenerative changes of the lumbar spine partially visualized. No displaced fracture of the pelvis. Visualized soft tissues are unremarkable. IMPRESSION: No acute abnormality of the pelvis. Electronically Signed   By: Miachel Roux M.D.   On: 10/21/2021 11:58  ? ?DG ELBOW COMPLETE RIGHT (3+VIEW) ? ?  Result Date: 10/21/2021 ?CLINICAL DATA:  Elbow pain status post fall EXAM: RIGHT ELBOW - COMPLETE 3+ VIEW COMPARISON:  None. FINDINGS: There is no evidence of fracture, dislocation, or joint effusion. There is no evidence of arthropathy or other focal bone abnormality. Soft tissues are unremarkable. IMPRESSION: No acute abnormality of the right elbow. Electronically Signed   By: Miachel Roux M.D.   On: 10/21/2021 12:00  ? ?DG Knee 2 Views Left ? ?Result Date: 10/21/2021 ?CLINICAL DATA:  Bilateral knee pain status post fall EXAM: LEFT KNEE - 1-2 VIEW COMPARISON:  None. FINDINGS: Chondrocalcinosis of the medial and lateral compartment. No fracture or dislocation. Calcifications in the posterior knee soft tissues are likely vascular. IMPRESSION: No acute abnormality of the left knee. Electronically Signed   By: Miachel Roux M.D.   On: 10/21/2021 12:01  ? ?DG Knee 2 Views Right ? ?Result Date: 10/21/2021 ?CLINICAL DATA:  Knee pain status post fall EXAM: RIGHT KNEE - 1-2 VIEW COMPARISON:  None. FINDINGS: Chondrocalcinosis of the medial and lateral  compartments. No fracture or dislocation. Soft tissues are normal. IMPRESSION: No acute abnormality of the right knee. Electronically Signed   By: Miachel Roux M.D.   On: 10/21/2021 12:02  ? ?CT Head Wo Contr

## 2021-10-24 NOTE — Hospital Course (Addendum)
? ?  Joshua Hess is a 81 y.o. male with medical history significant of Lewy body dementia, CAD, carotid artery disease status post endarterectomy, severe aortic stenosis status post AVR, CVA, essential tremor, depression, anxiety.  Admitted in January 2023 for rib fractures secondary to mechanical fall.  Recently diagnosed with Lewy body dementia and was just placed in [place] today due to worsening dementia.  While he was at the facility, he rolled out of bed and struck his head.  He is on aspirin 81 mg daily.   ?Family reported very poor oral intake over the last several days and overall rapid decline over the last few weeks.   ?  ?Sustained a superficial laceration to his forehead which was repaired with Dermabond.  Vital signs stable.  CT head and C-spine negative for acute abnormality.  Labs significant for AKI -BUN 31, creatinine 1.7 (baseline 1.0).  UA pending.  Patient was given Ativan and 500 cc normal saline bolus. ?  ?Patient is not able to give any history.  His husband at bedside is tearful and states patient was recently diagnosed with Lewy body dementia and rapidly declining over the last few weeks.  He has stopped eating.  He was just placed to SNF yesterday where he rolled out of bed and fell. ?

## 2021-10-24 NOTE — Assessment & Plan Note (Addendum)
Likely prerenal from dehydration given very poor oral intake.  ? BUN 31, creatinine 1.7 (baseline 1.0). ?-IV fluid hydration ?-Monitor renal function and avoid nephrotoxic agents ? ?

## 2021-10-24 NOTE — Progress Notes (Signed)
?PROGRESS NOTE ? ? ? ?Patient: Joshua Hess                            PCP: [name], MD                    ?DOB: 03/16/1941            DOA: 10/23/2021 ?LKG:[id number]             DOS: 10/24/2021, 12:09 PM ? ? LOS: 0 days  ? ?Date of Service: The patient was seen and examined on 10/24/2021 ? ?Subjective:  ? ?The patient was seen and examined this morning. ?Hemodynamically stable otherwise somnolent, unable to follow commands or hold any conversation at this point. ? ?Husband present at bedside ? ?Brief Narrative:  ? ? ? ?Joshua Hess is a 81 y.o. male with medical history significant of Lewy body dementia, CAD, carotid artery disease status post endarterectomy, severe aortic stenosis status post AVR, CVA, essential tremor, depression, anxiety.  Admitted in January 2023 for rib fractures secondary to mechanical fall.  Recently diagnosed with Lewy body dementia and was just placed in [facility] today due to worsening dementia.  While he was at the facility, he rolled out of bed and struck his head.  He is on aspirin 81 mg daily.   ?Family reported very poor oral intake over the last several days and overall rapid decline over the last few weeks.   ?  ?Sustained a superficial laceration to his forehead which was repaired with Dermabond.  Vital signs stable.  CT head and C-spine negative for acute abnormality.  Labs significant for AKI -BUN 31, creatinine 1.7 (baseline 1.0).  UA pending.  Patient was given Ativan and 500 cc normal saline bolus. ?  ?Patient is not able to give any history.  His husband at bedside is tearful and states patient was recently diagnosed with Lewy body dementia and rapidly declining over the last few weeks.  He has stopped eating.  He was just placed to SNF yesterday where he rolled out of bed and fell. ? ? ? ?Assessment & Plan:  ? ?Principal Problem: ?  Failure to thrive in adult ?Active Problems: ?  AKI (acute kidney injury) ([place]) ?  Goals of care, counseling/discussion ?  Mood disorder  ([place]) ? ? ? ? ?Assessment and Plan: ?* Failure to thrive in adult ?Lewy body dementia ?-Recently diagnosed Lewy body dementia presenting with rapidly progressive decline/failure to thrive.   ?Just placed to SNF yesterday 4/13 and brought into the ED soon after as he rolled out of bed and fell. ?-Palliative care consulted ?-Based on patient's current condition, severe failure to thrive, poor p.o. intake, cachexia, worsening dementia--prognosis is poor, patient may be hospice appropriate ?-We will avoid any oral intake since patient is somnolent, ? ? ?Goals of care, counseling/discussion ?- Husband present at bedside, all current finding, will comorbidities, progressive decline with a new diagnosis of Lewy body dementia.... Due to poor p.o. intake, significant failure to thrive, and comorbidities patient has poor prognosis ?-Husband expressed understanding follow-up with palliative care, if no improvement likely a candidate for hospice ?-He confirmed DNR/DNI status ? ?AKI (acute kidney injury) ([place]) ?Likely prerenal from dehydration given very poor oral intake.  ? BUN 31, creatinine 1.7 (baseline 1.0). ?-IV fluid hydration ?-Monitor renal function and avoid nephrotoxic agents ? ? ?Mood disorder ([place]) ?-Continue Zyprexa and trazodone ? ? ? ?---------------------------------------------------------------------------------------------------------------------------------- ? ?DVT  prophylaxis:  ?SCDs Start: 10/24/21 0550 ? ? ?Code Status:   Code Status: DNR ? ?Family Communication: Husband at bedside  ?the above findings and plan of care has been discussed with patient (and family)  in detail,  ?they expressed understanding and agreement of above. ?-Advance care planning has been discussed.  ? ?Admission status:   ?Status is: Observation ?The patient remains OBS appropriate and will d/c before 2 midnights. ? ? ? ? ?Procedures:  ? ?No admission procedures for hospital encounter. ? ? ?Antimicrobials:  ?Anti-infectives (From  admission, onward)  ? ? None  ? ?  ? ? ? ?Medication:  ? OLANZapine  5 mg Oral QHS  ? traZODone  150 mg Oral QHS  ? ? ?acetaminophen **OR** acetaminophen ? ? ?Objective:  ? ?Vitals:  ? 10/23/21 2245 10/24/21 0036 10/24/21 0313 10/24/21 1028  ?BP: 132/76 (!) 144/119 (!) 164/115 (!) 166/111  ?Pulse: 79 60 70 65  ?Resp:  '18 18 18  '$ ?Temp: 98.5 ?F (36.9 ?C) 98.1 ?F (36.7 ?C)  98.6 ?F (37 ?C)  ?TempSrc: Oral Oral  Oral  ?SpO2: 97% 95% 100% 100%  ? ? ?Intake/Output Summary (Last 24 hours) at 10/24/2021 1209 ?Last data filed at 10/24/2021 0609 ?Gross per 24 hour  ?Intake 500 ml  ?Output --  ?Net 500 ml  ? ?There were no vitals filed for this visit. ? ? ?Examination:  ? ?Physical Exam  ?Constitution: Comfortable, somnolent, cachectic, chronically ill looking male ?Psychiatric:   Currently somnolent, underlying dementia ?HEENT:        Facial facial, superior eyebrow laceration  ?normocephalic, PERRL, otherwise with in Normal limits  ?Chest:         Chest symmetric ?Cardio vascular:  S1/S2, RRR, No murmure, No Rubs or Gallops  ?pulmonary: Clear to auscultation bilaterally, respirations unlabored, negative wheezes / crackles ?Abdomen: Soft, non-tender, non-distended, bowel sounds,no masses, no organomegaly ?Muscular skeletal: Severe generalized cachexia, Limited exam - in bed,  ?Neuro: Limited exam-patient is somnolent  ?Extremities: Generalized muscle wasting, no pitting edema lower extremities, +2 pulses  ?Skin: Dry, warm to touch, negative for any Rashes, No open wounds ?Wounds: superficial laceration to his forehead  ?Other findings per nursing documentation ? ? ?------------------------------------------------------------------------------------------------------------------------------------------ ?  ? ?LABs:  ? ?  Latest Ref Rng & Units 10/23/2021  ? 11:08 PM 10/21/2021  ? 12:02 PM 09/07/2021  ?  4:36 PM  ?CBC  ?WBC 4.0 - 10.5 K/uL 9.8   6.4   9.2    ?Hemoglobin 13.0 - 17.0 g/dL 17.0   14.6   15.1    ?Hematocrit 39.0 - 52.0 %  48.2   42.3   42.9    ?Platelets 150 - 400 K/uL 212   166   170    ? ? ?  Latest Ref Rng & Units 10/24/2021  ?  8:38 AM 10/23/2021  ? 11:08 PM 10/21/2021  ? 12:02 PM  ?CMP  ?Glucose 70 - 99 mg/dL 93   115   95    ?BUN 8 - 23 mg/dL '30   31   20    '$ ?Creatinine 0.61 - 1.24 mg/dL 1.23   1.72   0.91    ?Sodium 135 - 145 mmol/L 138   137   140    ?Potassium 3.5 - 5.1 mmol/L 3.8   4.6   4.0    ?Chloride 98 - 111 mmol/L 108   103   107    ?CO2 22 - 32 mmol/L 21   23  28    ?Calcium 8.9 - 10.3 mg/dL 8.7   9.6   8.9    ?Total Protein 6.5 - 8.1 g/dL   6.5    ?Total Bilirubin 0.3 - 1.2 mg/dL   1.1    ?Alkaline Phos 38 - 126 U/L   51    ?AST 15 - 41 U/L   28    ?ALT 0 - 44 U/L   30    ? ? ? ? ? ?Micro Results ?No results found for this or any previous visit (from the past 240 hour(s)). ? ?Radiology Reports ?CT Head Wo Contrast ? ?Result Date: 10/24/2021 ?CLINICAL DATA:  Head trauma. EXAM: CT HEAD WITHOUT CONTRAST TECHNIQUE: Contiguous axial images were obtained from the base of the skull through the vertex without intravenous contrast. RADIATION DOSE REDUCTION: This exam was performed according to the departmental dose-optimization program which includes automated exposure control, adjustment of the mA and/or kV according to patient size and/or use of iterative reconstruction technique. COMPARISON:  CT head 10/21/2021. FINDINGS: Brain: No evidence of acute infarction, hemorrhage, hydrocephalus, extra-axial collection or mass lesion/mass effect. Again seen is mild diffuse atrophy and mild periventricular white matter hypodensity, likely chronic small vessel ischemic change. Vascular: Atherosclerotic calcifications are present within the cavernous internal carotid arteries. Skull: Normal. Negative for fracture or focal lesion. Sinuses/Orbits: No acute finding. Other: None. IMPRESSION: No acute intracranial abnormality. Electronically Signed   By: Ronney Asters M.D.   On: 10/24/2021 00:00  ? ?CT Cervical Spine Wo Contrast ? ?Result  Date: 10/23/2021 ?CLINICAL DATA:  Trauma. EXAM: CT CERVICAL SPINE WITHOUT CONTRAST TECHNIQUE: Multidetector CT imaging of the cervical spine was performed without intravenous contrast. Multiplanar CT image

## 2021-10-24 NOTE — Care Management (Signed)
ED RNCM  spoke with Joshua Hess at [facility] concerning possible transitional care plan.  Once patient medically cleared for discharge home. Inpatient  TOC team will continue to follow  ?

## 2021-10-24 NOTE — Consult Note (Signed)
?Consultation Note ?Date: 10/24/2021  ? ?Patient Name: Joshua Hess  ?DOB: 24-Feb-1941  MRN: [id number]  Age / Sex: 81 y.o., male  ?PCP: [name], MD ?Referring Physician: Deatra James, MD ? ?Reason for Consultation: Establishing goals of care ? ?HPI/Patient Profile: 81 y.o. male  with past medical history of  Lewy body dementia, CAD, carotid artery disease status post endarterectomy, severe aortic stenosis status post AVR, CVA, essential tremor, depression, and anxiety admitted on 10/23/2021 with fall.  Family reports very poor oral intake over several days and rapid decline.  Patient has had multiple falls recently and was just admitted to assisted living facility yesterday.  Patient also noted to have acute kidney injury on admission likely related to dehydration and poor p.o. intake.  Palliative medicine team consulted to discuss goals of care. ? ?Clinical Assessment and Goals of Care: ?I have reviewed medical records including EPIC notes, labs and imaging, assessed the patient and then met with patient's spouse Joshua Hess to discuss diagnosis prognosis, GOC, EOL wishes, disposition and options. ? ?I introduced Palliative Medicine as specialized medical care for people living with serious illness. It focuses on providing relief from the symptoms and stress of a serious illness. The goal is to improve quality of life for both the patient and the family. ? ?We discussed a brief life review of the patient.  Joshua Hess shares that he and Latrel have been together for 45 years and tells me the story of how they met.  Joshua Hess tells me Benn was an avid marathon runner-running in over 200 marathons.  Damaso and Joshua Hess moved to [place] in 2010. ? ?Joshua Hess tells me of Shadi's decline.  He tells me it started in April 2020 with a hospital admission and then a subsequent admission in May 2020 from which he never really recovered.  Joshua Hess tells me Lea has declined since that time but over the  past 2 weeks has had a very rapid decline.  Joshua Hess shares that over the past several days patient has had no p.o. intake-refusing all attempts to feed him. ? ? We discussed patient's current illness and what it means in the larger context of patient's on-going co-morbidities.  Natural disease trajectory and expectations at EOL were discussed.  We discussed that Guerino's dementia is irreversible and unfortunately situations like this are expected to continue.  We discussed that with fluids we can "perk him up" however it is likely that he will become dehydrated again given his refusal to take an liquids.  Joshua Hess expresses understanding and agrees that this is likely to have been given what he has seen over the past few weeks. ? ?I attempted to elicit values and goals of care important to the patient.  Joshua Hess shows me Milledge's living will and documents naming Joshua Hess as healthcare power of attorney.  Joshua Hess tells me that Lavoris had an aunt with dementia and he watched her decline and expressed that he would never want to be in her condition.  Joshua Hess tells me he believes given the situation Germaine would not want his life prolonged and would instead want to focus on his comfort. ? ?The difference between aggressive medical intervention and comfort care was considered in light of the patient's goals of care.  Joshua Hess made the decision to transition to focus on comfort care only. ? ?Hospice services outpatient were explained and offered.  Joshua Hess is interested in a hospice facility transfer-specifically interested in [facility]. ? ?Questions and concerns were addressed. The family was encouraged to  call with questions or concerns. ? ?Primary Decision Maker ?HCPOA -spouse Joshua Hess ?  ? ?SUMMARY OF RECOMMENDATIONS   ?-Transition to comfort measures only; discontinue orders not needed to promote comfort ?-Transfer to [facility] when bed is available -I have given referral to hospice liaison and discussed with transitions of care team ?-As needed  medications made available to promote comfort ? ?Code Status/Advance Care Planning: ?DNR ? ?Additional Recommendations (Limitations, Scope, Preferences): ?Full Comfort Care ? ?Prognosis:  ?< 2 weeks due to no p.o. intake related to patient's Lewy body dementia ? ?Discharge Planning: Hospice facility  ? ?  ? ?Primary Diagnoses: ?Present on Admission: ? Failure to thrive in adult ? Metabolic encephalopathy ? ? ?I have reviewed the medical record, interviewed the patient and family, and examined the patient. The following aspects are pertinent. ? ?Past Medical History:  ?Diagnosis Date  ? Aphasia   ? Cancer [facility])   ? behind ear- [place]  ? Carotid artery stenosis   ? Chronotropic incompetence   ? ED (erectile dysfunction)   ? Elevated PSA   ? HOH (hard of hearing)   ? Mild CAD   ? Plantar fasciitis   ? Postoperative atrial fibrillation (HCC)   ? Severe aortic stenosis   ? a. s/p tissue AVR 10/2018.  ? Sinus bradycardia   ? baseline  ? Stroke [facility]) 03/05/2020  ? TIA  ? Wears glasses   ? ?Social History  ? ?Socioeconomic History  ? Marital status: Married  ?  Spouse name: Not on file  ? Number of children: 0  ? Years of education: college  ? Highest education level: Bachelor's degree (e.g., BA, AB, BS)  ?Occupational History  ? Occupation: Retired-Human Resources  ?Tobacco Use  ? Smoking status: Former  ?  Years: 1.00  ?  Types: Cigarettes  ?  Quit date: 1970  ?  Years since quitting: 53.3  ? Smokeless tobacco: Never  ?Vaping Use  ? Vaping Use: Never used  ?Substance and Sexual Activity  ? Alcohol use: No  ? Drug use: No  ? Sexual activity: Not on file  ?Other Topics Concern  ? Not on file  ?Social History Narrative  ? Caffeine: 2 cups daily  ? Right-handed.  ? Lives with legal spouse, Joshua Hess.   ? ?Social Determinants of Health  ? ?Financial Resource Strain: Not on file  ?Food Insecurity: Not on file  ?Transportation Needs: Not on file  ?Physical Activity: Not on file  ?Stress: Not on file  ?Social Connections: Not on file   ? ?Family History  ?Problem Relation Age of Onset  ? Heart attack Mother   ? Other Father   ?     unsure of history  ? Heart disease Brother   ? Dementia Sister   ? ?Scheduled Meds: ? antiseptic oral rinse  15 mL Topical TID  ? OLANZapine  5 mg Oral QHS  ? traZODone  150 mg Oral QHS  ? ?Continuous Infusions: ?PRN Meds:.acetaminophen **OR** acetaminophen, glycopyrrolate **OR** glycopyrrolate **OR** glycopyrrolate, haloperidol **OR** haloperidol **OR** haloperidol lactate, LORazepam **OR** LORazepam **OR** LORazepam, morphine injection, polyvinyl alcohol ?Allergies  ?Allergen Reactions  ? Flomax [Tamsulosin] Other (See Comments)  ?  Made feel foggy headed  ? ?Review of Systems  ?Unable to perform ROS: Dementia  ? ?Physical Exam ?Constitutional:   ?   General: He is not in acute distress. ?   Appearance: He is ill-appearing.  ?   Comments: Mostly sleeps, does not wake to voice or touch -  occasional periods of agitation and restless movements but settles quickly  ?Pulmonary:  ?   Effort: Pulmonary effort is normal.  ?Skin: ?   General: Skin is warm and dry.  ?Neurological:  ?   Mental Status: He is disoriented.  ? ? ?Vital Signs: BP 121/69 (BP Location: Left Arm)   Pulse (!) 52   Temp 98.6 ?F (37 ?C) (Oral)   Resp 15   SpO2 98%  ?Pain Scale: 0-10 ?  ?Pain Score: 0-No pain ? ? ?SpO2: SpO2: 98 % ?O2 Device:SpO2: 98 % ?O2 Flow Rate: .  ? ?IO: Intake/output summary:  ?Intake/Output Summary (Last 24 hours) at 10/24/2021 1510 ?Last data filed at 10/24/2021 0609 ?Gross per 24 hour  ?Intake 500 ml  ?Output --  ?Net 500 ml  ? ? ?LBM:   ?Baseline Weight:   ?Most recent weight:       ?Palliative Assessment/Data: PPS 10% ? ? ? ?*Please note that this is a verbal dictation therefore any spelling or grammatical errors are due to the "[place] [employer]" system interpretation. ? ?Juel Burrow, DNP, AGNP-C ?Palliative Medicine Team ?[phone] ?Pager: [phone] ? ?

## 2021-10-24 NOTE — Assessment & Plan Note (Deleted)
-  Continue Zyprexa and trazodone ?

## 2021-10-25 DIAGNOSIS — N179 Acute kidney failure, unspecified: Secondary | ICD-10-CM | POA: Diagnosis not present

## 2021-10-25 DIAGNOSIS — Z515 Encounter for palliative care: Secondary | ICD-10-CM | POA: Diagnosis not present

## 2021-10-25 DIAGNOSIS — G3183 Dementia with Lewy bodies: Secondary | ICD-10-CM | POA: Diagnosis not present

## 2021-10-25 DIAGNOSIS — R627 Adult failure to thrive: Secondary | ICD-10-CM | POA: Diagnosis not present

## 2021-10-25 MED ORDER — LORAZEPAM 1 MG PO TABS
2.0000 mg | ORAL_TABLET | ORAL | Status: DC | PRN
Start: 2021-10-25 — End: 2021-10-28

## 2021-10-25 MED ORDER — LORAZEPAM 2 MG/ML PO CONC: 2.0000 mg | ORAL | Status: DC | PRN

## 2021-10-25 MED ORDER — LORAZEPAM 2 MG/ML IJ SOLN
2.0000 mg | INTRAMUSCULAR | Status: DC | PRN
  Administered 2021-10-25 – 2021-10-27 (×6): 2 mg via INTRAVENOUS
  Filled 2021-10-25 (×6): qty 1

## 2021-10-25 NOTE — ED Notes (Signed)
Family at bedside. 

## 2021-10-25 NOTE — Assessment & Plan Note (Signed)
Continue comfort care under hospice ?

## 2021-10-25 NOTE — Progress Notes (Signed)
CSW spoke with [facility] liaison and noted no update as patient is remaining pending approval for [facility] by their MD.  ?

## 2021-10-25 NOTE — ED Notes (Addendum)
Called [facility] to have purpleman initiated per process. Notified ED CN. ? ?

## 2021-10-25 NOTE — Progress Notes (Signed)
?Physician Discharge Summary ?  ?Patient: Joshua Hess MRN: [id number] DOB: 10/18/1940  ?Admit date:     10/23/2021  ?Discharge date: 10/25/21  ?Discharge Physician: Valeria Batman Zyaire Dumas  ? ?PCP: [name], MD  ? ?The patient was seen and examined this morning, confused, lethargic. ?Agitated overnight. ? ?Patient has been approved for [facility] pending bed availability. ? ?Recommendations at discharge:  ? ?Follow-up with hospice -  ? ?Discharge Diagnoses: ?Principal Problem: ?  Failure to thrive in adult ?Active Problems: ?  AKI (acute kidney injury) ([place]) ?  Goals of care, counseling/discussion ?  Mood disorder ([place]) ?  Metabolic encephalopathy ?  Comfort measures only status ? ?Resolved Problems: ?  * No resolved hospital problems. * ? ?Hospital Course: ? ? ?Joshua Hess is a 81 y.o. male with medical history significant of Lewy body dementia, CAD, carotid artery disease status post endarterectomy, severe aortic stenosis status post AVR, CVA, essential tremor, depression, anxiety.  Admitted in January 2023 for rib fractures secondary to mechanical fall.  Recently diagnosed with Lewy body dementia and was just placed in [place] today due to worsening dementia.  While he was at the facility, he rolled out of bed and struck his head.  He is on aspirin 81 mg daily.   ?Family reported very poor oral intake over the last several days and overall rapid decline over the last few weeks.   ?  ?Sustained a superficial laceration to his forehead which was repaired with Dermabond.  Vital signs stable.  CT head and C-spine negative for acute abnormality.  Labs significant for AKI -BUN 31, creatinine 1.7 (baseline 1.0).  UA pending.  Patient was given Ativan and 500 cc normal saline bolus. ?  ?Patient is not able to give any history.  His husband at bedside is tearful and states patient was recently diagnosed with Lewy body dementia and rapidly declining over the last few weeks.  He has stopped eating.  He was just  placed to SNF yesterday where he rolled out of bed and fell. ? ? ?* Failure to thrive in adult ?Lewy body dementia ?-Recently diagnosed Lewy body dementia presenting with rapidly progressive decline/failure to thrive.   ?Just placed to SNF yesterday 4/13 and brought into the ED soon after as he rolled out of bed and fell. ?-Palliative care consulted >> husband agreed to proceed with hospice ?-Based on patient's current condition, severe failure to thrive, poor p.o. intake, cachexia, worsening dementia--prognosis is poor, patient may be hospice appropriate ? ?-We will avoid any oral intake since patient is somnolent, ? ? ?Unable to tolerate any p.o. intake --hold p.o. meds are on hold ?We discontinued ? ? ? ?goals of care, counseling/discussion ?- Husband present at bedside, all current finding, will comorbidities, progressive decline with a new diagnosis of Lewy body dementia.... Due to poor p.o. intake, significant failure to thrive, and comorbidities patient has poor prognosis ?-Husband expressed understanding follow-up with palliative care, if no improvement likely a candidate for hospice ?-He confirmed DNR/DNI status ? ?AKI (acute kidney injury) ([place]) ?Likely prerenal from dehydration given very poor oral intake.  ? BUN 31, creatinine 1.7 (baseline 1.0). ?-IV fluid hydration ?-Monitor renal function and avoid nephrotoxic agents ? ? ?Mood disorder ([place]) ?-Holding Zyprexa and trazodone ? ? ?Consultants: Palliative care hospice ? ?Disposition: Hospice care ?Diet recommendation:  ?Discharge Diet Orders (From admission, onward)  ? ?  Start     Ordered  ? 10/24/21 0000  Diet - low sodium heart  healthy       ? 10/24/21 1457  ? ?  ?  ? ?  ? ?Regular diet ?DISCHARGE MEDICATION: ?Allergies as of 10/25/2021   ? ?   Reactions  ? Flomax [tamsulosin] Other (See Comments)  ? Made feel foggy headed  ? ?  ? ?  ?Medication List  ?  ? ?STOP taking these medications   ? ?ALPRAZolam 0.5 MG tablet ?Commonly known as: Duanne Moron ?   ?amoxicillin 500 MG capsule ?Commonly known as: AMOXIL ?  ?DULoxetine 30 MG capsule ?Commonly known as: CYMBALTA ?  ?OLANZapine 5 MG tablet ?Commonly known as: ZYPREXA ?  ?oxyCODONE 5 MG immediate release tablet ?Commonly known as: Oxy IR/ROXICODONE ?  ?rosuvastatin 10 MG tablet ?Commonly known as: CRESTOR ?  ?tadalafil 20 MG tablet ?Commonly known as: CIALIS ?  ?Zypitamag 4 MG Tabs ?Generic drug: Pitavastatin Magnesium ?  ? ?  ? ?TAKE these medications   ? ?Apoaequorin 10 MG Caps ?Take 10 mg by mouth daily. ?  ?aspirin 81 MG EC tablet ?Take 1 tablet (81 mg total) by mouth daily. Swallow whole. ?  ?fluorouracil 5 % cream ?Commonly known as: EFUDEX ?Apply 1 application. topically 2 (two) times daily as needed (skin protection). ?  ?glycopyrrolate 1 MG tablet ?Commonly known as: Robinul ?Take 1 tablet (1 mg total) by mouth 3 (three) times daily for 5 days. ?  ?glycopyrrolate 1 MG tablet ?Commonly known as: ROBINUL ?Take 1 tablet (1 mg total) by mouth every 4 (four) hours as needed (excessive secretions). ?  ?lidocaine 5 % ointment ?Commonly known as: XYLOCAINE ?Apply 1 application. topically in the morning and at bedtime. ?  ?magnesium hydroxide 400 MG/5ML suspension ?Commonly known as: MILK OF MAGNESIA ?Take 30 mLs by mouth daily as needed for mild constipation. ?  ?polyethylene glycol 17 g packet ?Commonly known as: MIRALAX / GLYCOLAX ?Take 17 g by mouth daily. ?  ?traZODone 50 MG tablet ?Commonly known as: DESYREL ?Take 3 tablets by mouth at bedtime. ?  ? ?  ? ?  ?  ? ? ?  ?Discharge Care Instructions  ?(From admission, onward)  ?  ? ? ?  ? ?  Start     Ordered  ? 10/24/21 0000  Discharge wound care:       ?Comments: Per nursing  ? 10/24/21 1457  ? ?  ?  ? ?  ? ? ?Discharge Exam: ?There were no vitals filed for this visit. ? ? ? ? ? ?Physical Exam: ?  ?General:  Awake, confused, minimal verbal response, chronically ill male  ?HEENT:  Normocephalic, PERRL, otherwise with in Normal limits   ?Neuro:  Limited exam  patient is lethargic  ?CNII-XII intact. ,  Moving extremities in bed, withdrawing to pain  ?Lungs:   Clear to auscultation BL, Respirations unlabored,  ?No wheezes / crackles  ?Cardio:    S1/S2, RRR, No murmure, No Rubs or Gallops   ?Abdomen:  Soft, non-tender, bowel sounds active all four quadrants, ?no guarding or peritoneal signs.  ?Muscular  ?skeletal:  Limited exam -global generalized weaknesses ?- in bed, able to move all 4 extremities,   ?2+ pulses,  symmetric, No pitting edema  ?Skin:  Dry, warm to touch, negative for any Rashes,  ?Wounds: Please see nursing documentation ?   ? ? ? ?Condition at discharge: worsening ? ?The results of significant diagnostics from this hospitalization (including imaging, microbiology, ancillary and laboratory) are listed below for reference.  ? ?Imaging Studies: ?DG Pelvis 1-2  Views ? ?Result Date: 10/21/2021 ?CLINICAL DATA:  Fall EXAM: PELVIS - 1-2 VIEW COMPARISON:  08/08/2021 FINDINGS: Degenerative changes of the lumbar spine partially visualized. No displaced fracture of the pelvis. Visualized soft tissues are unremarkable. IMPRESSION: No acute abnormality of the pelvis. Electronically Signed   By: Miachel Roux M.D.   On: 10/21/2021 11:58  ? ?DG ELBOW COMPLETE RIGHT (3+VIEW) ? ?Result Date: 10/21/2021 ?CLINICAL DATA:  Elbow pain status post fall EXAM: RIGHT ELBOW - COMPLETE 3+ VIEW COMPARISON:  None. FINDINGS: There is no evidence of fracture, dislocation, or joint effusion. There is no evidence of arthropathy or other focal bone abnormality. Soft tissues are unremarkable. IMPRESSION: No acute abnormality of the right elbow. Electronically Signed   By: Miachel Roux M.D.   On: 10/21/2021 12:00  ? ?DG Knee 2 Views Left ? ?Result Date: 10/21/2021 ?CLINICAL DATA:  Bilateral knee pain status post fall EXAM: LEFT KNEE - 1-2 VIEW COMPARISON:  None. FINDINGS: Chondrocalcinosis of the medial and lateral compartment. No fracture or dislocation. Calcifications in the posterior knee soft  tissues are likely vascular. IMPRESSION: No acute abnormality of the left knee. Electronically Signed   By: Miachel Roux M.D.   On: 10/21/2021 12:01  ? ?DG Knee 2 Views Right ? ?Result Date: 10/21/2021 ?CLINICAL D

## 2021-10-25 NOTE — Progress Notes (Signed)
Patient arrived to unit with husband at bedside. Waiting for bed availability at [facility] facility. Patient's husband oriented to room and call bell, verbalizes understanding. No needs identified at this time.  ?

## 2021-10-25 NOTE — Assessment & Plan Note (Signed)
Multifactorial, multiple comorbidities, including with worsening dementia ?

## 2021-10-25 NOTE — Progress Notes (Signed)
[place] ([facility]) ?Hospice hospital liaison note ? ?Mr. Casale is deemed eligible for BP. Unfortunately we do not have a bed to offer today. TOC aware we will follow up tomorrow or sooner if a bed becomes available.  ? ?Thank you for the opportunity to participate in this patient's care.  ? ?Jhonnie Garner, BSN, Therapist, sports, WTA-C ?Hospice hospital liaison  ?[phone] ?

## 2021-10-25 NOTE — Progress Notes (Signed)
?                                                   ?Daily Progress Note  ? ?Patient Name: Joshua Hess       Date: 10/25/2021 ?DOB: 1941-06-20  Age: 81 y.o. MRN#: [id number] ?Attending Physician: Deatra James, MD ?Primary Care Physician: [name], MD ?Admit Date: 10/23/2021 ? ?Reason for Consultation/Follow-up: Establishing goals of care ? ?Subjective: ?Disoriented, calling out for "father" ? ?Length of Stay: 1 ? ?Current Medications: ?Scheduled Meds:  ? antiseptic oral rinse  15 mL Topical TID  ? OLANZapine  5 mg Oral QHS  ? traZODone  150 mg Oral QHS  ? ? ?Continuous Infusions: ? ? ?PRN Meds: ?acetaminophen **OR** acetaminophen, glycopyrrolate **OR** glycopyrrolate **OR** glycopyrrolate, haloperidol **OR** haloperidol **OR** haloperidol lactate, haloperidol lactate, LORazepam **OR** LORazepam **OR** LORazepam, morphine injection, morphine injection, polyvinyl alcohol ? ?Physical Exam ?Constitutional:   ?   Appearance: He is ill-appearing.  ?   Comments: Lethargic  ?Pulmonary:  ?   Effort: Pulmonary effort is normal.  ?Skin: ?   General: Skin is warm and dry.  ?Neurological:  ?   Mental Status: He is disoriented.  ?Psychiatric:     ?   Behavior: Behavior is agitated.     ?   Cognition and Memory: Cognition is impaired.  ?         ? ?Vital Signs: BP (!) 148/72   Pulse 67   Temp 98.5 ?F (36.9 ?C) (Oral)   Resp 17   SpO2 96%  ?SpO2: SpO2: 96 % ?O2 Device: O2 Device: Room Air ?O2 Flow Rate:   ? ?Intake/output summary: No intake or output data in the 24 hours ending 10/25/21 1409 ?LBM:   ?Baseline Weight:   ?Most recent weight:   ? ?     ?Palliative Assessment/Data: PPS 10% ? ? ? ?Flowsheet Rows   ? ?Flowsheet Row Most Recent Value  ?Intake Tab   ?Referral Department Hospitalist  ?Unit at Time of Referral ER  ?Palliative Care Primary Diagnosis Neurology  ?Date Notified  10/24/21  ?Palliative Care Type New Palliative care  ?Reason for referral Clarify Goals of Care  ?Date of Admission 10/24/21  ?Date first seen by Palliative Care 10/24/21  ?# of days Palliative referral response time 0 Day(s)  ?# of days IP prior to Palliative referral 0  ?Clinical Assessment   ?Psychosocial & Spiritual Assessment   ?Palliative Care Outcomes   ? ?  ? ? ?Patient Active Problem List  ? Diagnosis Date Noted  ? Failure to thrive in adult 10/24/2021  ? AKI (acute kidney injury) ([place]) 10/24/2021  ? Mood disorder ([place]) 10/24/2021  ? Goals of care, counseling/discussion 10/24/2021  ? Metabolic encephalopathy 95/18/8416  ? Comfort measures only status 10/24/2021  ? Rib fracture 08/09/2021  ? Syncope 08/08/2021  ? Rib fractures 08/08/2021  ? Pain due to onychomycosis of toenails of both feet 04/30/2021  ? Cognitive impairment 11/11/2020  ? Depression with anxiety 11/11/2020  ? Carotid artery disease ([place]) 11/11/2020  ? Essential tremor 11/11/2020  ? Bunionette of left foot 06/20/2020  ? Fatigue 06/20/2020  ? History of left-sided carotid endarterectomy 05/27/2020  ? Facial droop 03/05/2020  ? Mild CAD   ? Acute urinary retention 11/18/2019  ? Post-operative state 11/18/2019  ? Aphasia 08/07/2019  ? S/P  AVR (aortic valve replacement)   ? Carotid artery stenosis   ? Chronotropic incompetence   ? Severe aortic stenosis   ? Sinus bradycardia 08/12/2018  ? Malignant melanoma of scalp ([place]) 09/22/2015  ? Benign prostatic hyperplasia with nocturia 11/01/2012  ? Enlarged prostate with lower urinary tract symptoms (LUTS) 11/01/2012  ? ? ?Palliative Care Assessment & Plan  ? ?HPI: ?81 y.o. male  with past medical history of  Lewy body dementia, CAD, carotid artery disease status post endarterectomy, severe aortic stenosis status post AVR, CVA, essential tremor, depression, and anxiety admitted on 10/23/2021 with fall.  Family reports very poor oral intake over several days and rapid decline.  Patient has had multiple  falls recently and was just admitted to assisted living facility yesterday.  Patient also noted to have acute kidney injury on admission likely related to dehydration and poor p.o. intake.  Palliative medicine team consulted to discuss goals of care. ? ?Assessment: ?Patient appears agitated this morning, requested nurse administer IV Ativan. ?Spoke with patient's spouse via telephone later in the day and he reports he is currently meeting with hospice liaison and remains hopeful for a bed at the hospice home.  Spouse reports patient more calm and sleeping following Ativan administration.  All agree to continue comfort focused measures ? ?Recommendations/Plan: ?Continue comfort measures only ?IV Ativan now for agitation ?Continue as needed meds to promote comfort, discontinue all medications not needed to promote comfort ?Hopeful for [facility] bed ? ?Goals of Care and Additional Recommendations: ?Limitations on Scope of Treatment: Full Comfort Care ? ?Code Status: ?DNR ? ?Prognosis: ? < 2 weeks ? ?Discharge Planning: ?Hospice facility ? ?Care plan was discussed with RN and patient's spouse ? ?Thank you for allowing the Palliative Medicine Team to assist in the care of this patient. ? ? ?*Please note that this is a verbal dictation therefore any spelling or grammatical errors are due to the "[employer] [place] [employer]" system interpretation. ? ?Juel Burrow, DNP, AGNP-C ?Palliative Medicine Team ?Team Phone # [phone]  ?Pager [phone] ? ?

## 2021-10-26 DIAGNOSIS — Z66 Do not resuscitate: Secondary | ICD-10-CM

## 2021-10-26 DIAGNOSIS — W19XXXA Unspecified fall, initial encounter: Secondary | ICD-10-CM

## 2021-10-26 DIAGNOSIS — F0280 Dementia in other diseases classified elsewhere without behavioral disturbance: Secondary | ICD-10-CM

## 2021-10-26 DIAGNOSIS — N179 Acute kidney failure, unspecified: Secondary | ICD-10-CM | POA: Diagnosis not present

## 2021-10-26 DIAGNOSIS — Z515 Encounter for palliative care: Secondary | ICD-10-CM | POA: Diagnosis not present

## 2021-10-26 DIAGNOSIS — S0181XA Laceration without foreign body of other part of head, initial encounter: Secondary | ICD-10-CM | POA: Diagnosis not present

## 2021-10-26 DIAGNOSIS — G3183 Dementia with Lewy bodies: Secondary | ICD-10-CM | POA: Diagnosis not present

## 2021-10-26 DIAGNOSIS — R627 Adult failure to thrive: Secondary | ICD-10-CM | POA: Diagnosis not present

## 2021-10-26 NOTE — Progress Notes (Signed)
[place] ([facility]) ?Hospice hospital liaison note ?  ?Mr. Joshua Hess is eligible for BP. Unfortunately we do not have a bed to offer today. TOC aware we will follow up tomorrow or sooner if a bed becomes available.  ?  ?Thank you for the opportunity to participate in this patient's care.  ? ?Clementeen Hoof, BSN, RN ?Hospital Liaison ?[phone] ?

## 2021-10-26 NOTE — Progress Notes (Signed)
?                                                   ?Palliative Care Progress Note, Assessment & Plan  ? ?Patient Name: Joshua Hess       Date: 10/26/2021 ?DOB: [birth year]  Age: 81 y.o. MRN#: [id number] ?Attending Physician: Mendel Corning, MD ?Primary Care Physician: [name], MD ?Admit Date: 10/23/2021 ? ?Reason for Consultation/Follow-up: Establishing goals of care ? ?Subjective: ?Patient is lying flat in bed in no apparent distress.  He does not have a count 9 but appears comfortable.  He is not alert or able to make his wishes known.  Husband is at bedside. ? ?HPI: ?81 y.o. male  with past medical history of  Lewy body dementia, CAD status post endarterectomy, severe aortic stenosis status post AVR, CVA, essential tremor, depression, and anxiety admitted on 10/23/2021 with a fall at [facility]. Patient has had multiple falls recently and was admitted to [facility] assisted living facility for approximately 8 hours before transfer to ED d/t fall.   ? ?Patient also noted to have acute kidney injury on admission likely related to dehydration and poor p.o. intake.  Palliative medicine team consulted to discuss goals of care. ? ?Family has decided to move to full comfort meaures. Pt was evaluated and accepted for admission to [facility]. No bed available as of 4/15.  ? ?Summary of counseling/coordination of care: ?After reviewing the patient's chart and assessing the patient at bedside, I met with patient's husband Joshua Hess.  I shared that a [facility] had approved patient for acceptance to [facility].  Joshua Hess was unaware of the approval for [facility] and became appropriately tearful but happy with this news.  We discussed that now patient will await an open bed and be transported to [facility] when bed is ready. ? ?Therapeutic silence and active listening  provided for Joshua Hess to share his thoughts and emotions regarding the patient's current health status.  Joshua Hess shares he is relieved to know that he will be able to get Joshua Hess to a safe place with good care.  Joshua Hess also shares that he is surprised with how quickly the patient has declined. However, Joshua Hess shares that Joshua Hess was clear in his living will that he would not want life prolonging measures if he were to suffer from cognitive impairment. So, he knows he is honoring Joshua Hess's wishes. ? ?Patient appears comfortable and PRN medications remain available. I discussed that as needed medications for anxiety, increased WOB, pain, and discmofort are avaialble should Joshua Hess need them. Joshua Hess shared that patinet likes to fidget and that the activity belt has been very useful in keeping the patient calm. Joshua Hess shares this is the first time in a long time he has seen the patient actually resting and he is grateful for this.  ? ?PMT will continue to follow the patient throughout his hospitalization.  Full comfort measures and DNR remains.  Awaiting bed at [facility]. ? ?Code Status: ?DNR ? ?Prognosis: ?< 2 weeks ? ?Discharge Planning: ?Hospice facility ? ?Care plan was discussed with patient, patient's husband Joshua Hess ? ?Physical Exam ?Vitals and nursing note reviewed.  ?Constitutional:   ?   General: He is not in acute distress. ?   Appearance: Normal appearance. He is not ill-appearing.  ?HENT:  ?  Head: Normocephalic and atraumatic.  ?   Mouth/Throat:  ?   Mouth: Mucous membranes are moist.  ?Cardiovascular:  ?   Rate and Rhythm: Normal rate.  ?   Pulses: Normal pulses.  ?Pulmonary:  ?   Effort: Pulmonary effort is normal.  ?Abdominal:  ?   Palpations: Abdomen is soft.  ?Skin: ?   General: Skin is warm and dry.  ?Neurological:  ?   Comments: Nonverbal  ?         ? ?Palliative Assessment/Data: 20% ? ? ? ?Total Time 25 minutes  ?Greater than 50%  of this time was spent counseling and coordinating care related to the above assessment and  plan. ? ?Thank you for allowing the Palliative Medicine Team to assist in the care of this patient. ? ?Verdell Carmine. Lassie Demorest, DNP, FNP-BC ?Palliative Medicine Team ?Team Phone # [phone] ?  ?

## 2021-10-26 NOTE — Progress Notes (Signed)
? ? ? [facility] ?                                                                            ? ? ?Joshua Hess, is a 81 y.o. male, DOB - 11/28/1940, JXB:[id number] ?Admit date - 10/23/2021    ?Outpatient Primary MD for the patient is [name], MD ? ?LOS - 2  days ? ? ? ?Brief summary  ? ? ?Joshua Hess is a 81 y.o. male with medical history significant of Lewy body dementia, CAD, carotid artery disease status post endarterectomy, severe aortic stenosis status post AVR, CVA, essential tremor, depression, anxiety.  Admitted in January 2023 for rib fractures secondary to mechanical fall.  Recently diagnosed with Lewy body dementia and was just placed in [place] a day before the admission due to worsening dementia.  While he was at the facility, he rolled out of bed and struck his head. Family reported very poor oral intake over the last several days and overall rapid decline over the last few weeks.   ? Sustained a superficial laceration to his forehead which was repaired with Dermabond.  Vital signs stable.  CT head and C-spine negative for acute abnormality.  Labs significant for AKI -BUN 31, creatinine 1.7 (baseline 1.0).  UA pending.  Patient was given Ativan and 500 cc normal saline bolus. ?  ?Patient was not able to provide any history.  His husband at bedside is tearful and states patient was recently diagnosed with Lewy body dementia and rapidly declining over the last few weeks.  He has stopped eating.  ? ?4/16: Currently awaiting residential hospice, hopeful for [facility] ? ?Assessment & Plan  ? ? ?Assessment and Plan: ?* Failure to thrive in adult, Lewy body dementia ?-Recently diagnosed Lewy body dementia presenting with rapidly progressive decline/failure to thrive.  Was placed to SNF on 4/13 however brought back to the ED after he had a fall. ?Palliative medicine consulted, overall poor prognosis, lean body dementia, FTT, poor p.o. intake, hospice appropriate ?-Currently somnolent, not  following any commands.  Awaiting residential hospice bed ?-DNR/DNI ? ? ?AKI (acute kidney injury) ([place]) ?Likely prerenal from dehydration given very poor oral intake.  ? BUN 31, creatinine 1.7 (baseline 1.0). ?-Patient was placed on IV fluid hydration, creatinine improved to 1.2 ?-Labs discontinued, comfort care status ? ? ?Comfort measures only status ?Continue comfort care under hospice ? ?Metabolic encephalopathy ?Multifactorial, multiple comorbidities, including with worsening dementia ?-Continue comfort care status ? ?Mood disorder ([place]) ?- Home medication Zyprexa and trazodone discontinued  ?-Continue comfort care status ? ?Code Status: DNR/DNI ?DVT Prophylaxis:   ? ? ?Level of Care: Level of care: Med-Surg, palliative ?Family Communication: Updated patient's spouse at the bedside ?Disposition Plan:     Remains inpatient appropriate: Waiting [facility] ? ?Procedures:  ? ? ?Consultants:   ?Palliative medicine ? ?Antimicrobials:  ?None ?Medications ? ? antiseptic oral rinse  15 mL Topical TID  ? OLANZapine  5 mg Oral QHS  ? traZODone  150 mg Oral QHS  ? ? ? ?Subjective:  ? ?Joshua Hess was seen and examined today.  Currently somnolent, not following any verbal commands.  Spouse at the bedside.  Awaiting hospice.  Appears comfortable.   ? ?Objective:  ? ?Vitals:  ? 10/25/21 0600 10/25/21 1011 10/25/21 1106 10/26/21 0541  ?BP:  (!) 148/72  132/88  ?Pulse: (!) 57 67  94  ?Resp:  17  19  ?Temp:   98.5 ?F (36.9 ?C) 98.9 ?F (37.2 ?C)  ?TempSrc:   Oral Tympanic  ?SpO2: 97% 96%  96%  ? ?No intake or output data in the 24 hours ending 10/26/21 1247 ?There were no vitals filed for this visit. ? ? ?Exam ?General: Somnolent, not following any verbal commands, cachexia ?Cardiovascular: S1 S2 auscultated, RRR ?Respiratory: Clear to auscultation bilaterally, no wheezing ?Gastrointestinal: Soft, nontender, nondistended, + bowel sounds ?Ext: no pedal edema bilaterally ?Neuro: not following any verbal commands ? ? ?Data  Reviewed:  I have personally reviewed following labs  ? ? ?CBC ?Lab Results  ?Component Value Date  ? WBC 9.8 10/23/2021  ? RBC 5.04 10/23/2021  ? HGB 17.0 10/23/2021  ? HCT 48.2 10/23/2021  ? MCV 95.6 10/23/2021  ? MCH 33.7 10/23/2021  ? PLT 212 10/23/2021  ? MCHC 35.3 10/23/2021  ? RDW 11.9 10/23/2021  ? LYMPHSABS 1.2 10/21/2021  ? MONOABS 0.7 10/21/2021  ? EOSABS 0.1 10/21/2021  ? BASOSABS 0.0 10/21/2021  ? ? ? ?Last metabolic panel ?Lab Results  ?Component Value Date  ? NA 138 10/24/2021  ? K 3.8 10/24/2021  ? CL 108 10/24/2021  ? CO2 21 (L) 10/24/2021  ? BUN 30 (H) 10/24/2021  ? CREATININE 1.23 10/24/2021  ? GLUCOSE 93 10/24/2021  ? GFRNONAA 59 (L) 10/24/2021  ? GFRAA >60 03/09/2020  ? CALCIUM 8.7 (L) 10/24/2021  ? PHOS 3.8 03/09/2020  ? PROT 6.5 10/21/2021  ? ALBUMIN 3.6 10/21/2021  ? LABGLOB 2.3 11/11/2020  ? AGRATIO 1.8 11/11/2020  ? BILITOT 1.1 10/21/2021  ? ALKPHOS 51 10/21/2021  ? AST 28 10/21/2021  ? ALT 30 10/21/2021  ? ANIONGAP 9 10/24/2021  ? ? ? ? ?Estill Cotta M.D. ?[facility] ?10/26/2021, 12:47 PM ? ?Available via Epic secure chat 7am-7pm ?After 7 pm, please refer to night coverage provider listed on amion. ? ?  ?

## 2021-10-27 ENCOUNTER — Ambulatory Visit: Payer: Medicare HMO

## 2021-10-27 LAB — QUANTIFERON-TB GOLD PLUS (RQFGPL)
QuantiFERON Mitogen Value: >10 IU/mL
QuantiFERON Nil Value: 0.02 IU/mL
QuantiFERON TB1 Ag Value: 0.02 IU/mL
QuantiFERON TB2 Ag Value: 0.01 IU/mL

## 2021-10-27 LAB — QUANTIFERON-TB GOLD PLUS: QuantiFERON-TB Gold Plus: NEGATIVE

## 2021-10-27 NOTE — TOC Progression Note (Signed)
Transition of Care (TOC) - Progression Note  ? ? ?Patient Details  ?Name: Joshua Hess ?MRN: [id number] ?Date of Birth: 1941-05-20 ? ?Transition of Care (TOC) CM/SW Contact  ?Marilu Favre, RN ?Phone Number: ?10/27/2021, 2:44 PM ? ?Clinical Narrative:    ? ?Accepted a bed at [facility]. Nurse to call report. [facility] called. [facility] paperwork and DNR Form in chart  ? ?  ?  ? ?Expected Discharge Plan and Services ?  ?  ?  ?  ?  ?Expected Discharge Date: 10/24/21               ?  ?  ?  ?  ?  ?  ?  ?  ?  ?  ? ? ?Social Determinants of Health (SDOH) Interventions ?Transportation Interventions: Intervention Not Indicated ? ?Readmission Risk Interventions ?   ? View : No data to display.  ?  ?  ?  ? ? ?

## 2021-10-27 NOTE — Progress Notes (Signed)
Patient seen and examined at bedside this morning.  His spouse was at the bedside.  Patient is currently on full comfort care and waiting for initial hospice bed.  During my evaluation, patient looks overall comfortable, not in distress, lying in bed, eyes closed.  There is no change in the medical management.  As per the report, he is able to go to [facility] today.  Discharge summary and orders are in ?

## 2021-10-27 NOTE — Care Management Important Message (Signed)
Important Message ? ?Patient Details  ?Name: Joshua Hess ?MRN: [id number] ?Date of Birth: [birth year] ? ? ?Medicare Important Message Given:  Yes ? ? ? ? ?Kalesha Irving ?10/27/2021, 2:10 PM ?

## 2021-10-27 NOTE — Progress Notes (Signed)
Patient KG:URKY RAVIN DENARDO      DOB: 1940-08-21      HCW:[id number] ? ? ? ?  ?Palliative Medicine Team ? ? ? ?Subjective: Bedside symptom check completed. Significant other, Mark bedside at this time.  ? ? ?Physical exam: Patient alert and resting in bed at time of visit. Patient with noticeable tremor and restlessness noted. Mark asking for something to help with anxiety. Breathing even and non-labored but with increased rate. No excessive secretions noted. Patient verbally answers questions with one word answers, denies pain at this time. Mark in agreement that this seems anxiety driven, not pain. ? ? ?Assessment and plan: This RN reached out to bedside RN, Bev and asked for IV ativan PRN to be given, she is in agreement. No other concerns or needs at this time. [name] shares that he just learned they have a [employer] bed today and he will be transferring per ambulance today. Golden rod signed DNR on chart, social work on it. Will continue to follow for changes or advances. ? ? ?Thank you for allowing the Palliative Medicine Team to assist in the care of this patient. ?  ?  ?Damian Leavell, MSN, RN ?Palliative Medicine Team ?Team Phone: [phone]  ?This phone is monitored 7a-7p, please reach out to attending physician outside of these hours for urgent needs.   ?

## 2021-10-27 NOTE — Progress Notes (Signed)
[facility] Hospital Liaison Note   Bed offered and accepted for transfer to [facility] today. Unit RN please call report to [phone] prior to patient leaving the unit. Please send signed DNR and paperwork with patient.    Please call with any questions or concerns. Thank you   Shanita Wicker, LCSW [facility] Hospital Liaison [phone] 

## 2021-10-29 NOTE — Therapy (Signed)
[place] ?[facility] ?[address], [address] ?[place], [place], [address] ?Phone: [phone]   Fax:  [phone] ?PHYSICAL THERAPY DISCHARGE SUMMARY ? ?Visits from Start of Care: 14 ? ?Current functional level related to goals / functional outcomes: ?Visits cancelled per patient/spouse request due to change in medical status ?  ?Remaining deficits: ?Continued gait and balance deficits ?  ?Education / Equipment: ?HEP initiated  ? ?Patient agrees to discharge. Patient goals were partially met. Patient is being discharged due to the patient's request. ? ?Patient Details  ?Name: Joshua Hess ?MRN: [id number] ?Date of Birth: 09/30/1940 ?Referring Provider:  No ref. provider found ? ?Encounter Date: 10/29/2021 ? ? ?Toniann Fail, PT ?10/29/2021, 1:25 PM ? ?[place] ?[facility] ?[address], [address] ?[place], [place], [address] ?Phone: [phone]   Fax:  [phone] ?

## 2021-10-30 ENCOUNTER — Ambulatory Visit: Payer: Medicare HMO

## 2021-10-30 ENCOUNTER — Encounter (HOSPITAL_COMMUNITY): Payer: Medicare HMO

## 2021-10-31 ENCOUNTER — Ambulatory Visit: Payer: Medicare HMO

## 2021-11-03 ENCOUNTER — Ambulatory Visit: Payer: Medicare HMO

## 2021-11-04 ENCOUNTER — Ambulatory Visit: Payer: Medicare HMO | Admitting: Podiatry

## 2021-11-07 ENCOUNTER — Ambulatory Visit: Payer: Medicare HMO

## 2021-11-10 DEATH — deceased

## 2022-02-06 ENCOUNTER — Encounter (HOSPITAL_COMMUNITY): Payer: Medicare HMO

## 2022-02-06 ENCOUNTER — Ambulatory Visit: Payer: Medicare HMO
